# Patient Record
Sex: Female | Born: 1940 | Race: Black or African American | Hispanic: No | Smoking: Former smoker
Health system: Southern US, Community
[De-identification: ages and names within clinical notes are randomized; demographics above are authoritative.]

## PROBLEM LIST (undated history)

## (undated) DIAGNOSIS — R05 Cough: Secondary | ICD-10-CM

## (undated) DIAGNOSIS — R059 Cough, unspecified: Secondary | ICD-10-CM

## (undated) DIAGNOSIS — K219 Gastro-esophageal reflux disease without esophagitis: Secondary | ICD-10-CM

## (undated) DIAGNOSIS — I152 Hypertension secondary to endocrine disorders: Secondary | ICD-10-CM

## (undated) DIAGNOSIS — J45909 Unspecified asthma, uncomplicated: Secondary | ICD-10-CM

## (undated) DIAGNOSIS — N63 Unspecified lump in unspecified breast: Secondary | ICD-10-CM

## (undated) DIAGNOSIS — R062 Wheezing: Secondary | ICD-10-CM

## (undated) DIAGNOSIS — E119 Type 2 diabetes mellitus without complications: Secondary | ICD-10-CM

## (undated) DIAGNOSIS — J40 Bronchitis, not specified as acute or chronic: Secondary | ICD-10-CM

## (undated) DIAGNOSIS — E1159 Type 2 diabetes mellitus with other circulatory complications: Secondary | ICD-10-CM

## (undated) DIAGNOSIS — I10 Essential (primary) hypertension: Secondary | ICD-10-CM

## (undated) DIAGNOSIS — M1990 Unspecified osteoarthritis, unspecified site: Secondary | ICD-10-CM

## (undated) DIAGNOSIS — K5900 Constipation, unspecified: Secondary | ICD-10-CM

## (undated) HISTORY — DX: Unspecified osteoarthritis, unspecified site: M19.90

## (undated) HISTORY — DX: Cough, unspecified: R05.9

## (undated) HISTORY — DX: Wheezing: R06.2

## (undated) HISTORY — DX: Constipation, unspecified: K59.00

## (undated) HISTORY — DX: Cough: R05

## (undated) HISTORY — DX: Unspecified asthma, uncomplicated: J45.909

## (undated) HISTORY — DX: Gastro-esophageal reflux disease without esophagitis: K21.9

## (undated) HISTORY — DX: Unspecified lump in unspecified breast: N63.0

---

## 1978-12-22 HISTORY — PX: CYSTECTOMY: SUR359

## 1994-04-22 HISTORY — PX: ABDOMINAL HYSTERECTOMY: SHX81

## 1998-08-29 ENCOUNTER — Encounter: Payer: Self-pay | Admitting: Internal Medicine

## 1998-08-29 ENCOUNTER — Ambulatory Visit (HOSPITAL_COMMUNITY): Admission: RE | Admit: 1998-08-29 | Discharge: 1998-08-29 | Payer: Self-pay | Admitting: Internal Medicine

## 1998-09-25 ENCOUNTER — Ambulatory Visit (HOSPITAL_COMMUNITY): Admission: RE | Admit: 1998-09-25 | Discharge: 1998-09-25 | Payer: Self-pay | Admitting: Internal Medicine

## 1999-04-23 HISTORY — PX: THYROID SURGERY: SHX805

## 2000-11-07 ENCOUNTER — Other Ambulatory Visit: Admission: RE | Admit: 2000-11-07 | Discharge: 2000-11-07 | Payer: Self-pay | Admitting: Family Medicine

## 2002-06-08 ENCOUNTER — Other Ambulatory Visit: Admission: RE | Admit: 2002-06-08 | Discharge: 2002-06-08 | Payer: Self-pay | Admitting: Internal Medicine

## 2004-06-07 ENCOUNTER — Ambulatory Visit (HOSPITAL_COMMUNITY): Admission: RE | Admit: 2004-06-07 | Discharge: 2004-06-07 | Payer: Self-pay | Admitting: Family Medicine

## 2006-04-30 ENCOUNTER — Encounter: Admission: RE | Admit: 2006-04-30 | Discharge: 2006-04-30 | Payer: Self-pay | Admitting: Internal Medicine

## 2007-05-05 ENCOUNTER — Encounter: Admission: RE | Admit: 2007-05-05 | Discharge: 2007-05-05 | Payer: Self-pay | Admitting: Internal Medicine

## 2008-05-26 ENCOUNTER — Encounter: Admission: RE | Admit: 2008-05-26 | Discharge: 2008-05-26 | Payer: Self-pay | Admitting: Internal Medicine

## 2008-07-21 ENCOUNTER — Encounter: Admission: RE | Admit: 2008-07-21 | Discharge: 2008-08-18 | Payer: Self-pay | Admitting: Chiropractic Medicine

## 2008-08-13 ENCOUNTER — Inpatient Hospital Stay (HOSPITAL_COMMUNITY): Admission: EM | Admit: 2008-08-13 | Discharge: 2008-08-15 | Payer: Self-pay | Admitting: Emergency Medicine

## 2008-08-15 ENCOUNTER — Encounter (INDEPENDENT_AMBULATORY_CARE_PROVIDER_SITE_OTHER): Payer: Self-pay | Admitting: Internal Medicine

## 2008-09-28 ENCOUNTER — Encounter: Admission: RE | Admit: 2008-09-28 | Discharge: 2008-11-14 | Payer: Self-pay | Admitting: Sports Medicine

## 2009-06-04 ENCOUNTER — Ambulatory Visit (HOSPITAL_BASED_OUTPATIENT_CLINIC_OR_DEPARTMENT_OTHER): Admission: RE | Admit: 2009-06-04 | Discharge: 2009-06-04 | Payer: Self-pay | Admitting: Internal Medicine

## 2009-06-18 ENCOUNTER — Ambulatory Visit: Payer: Self-pay | Admitting: Internal Medicine

## 2009-06-27 ENCOUNTER — Encounter: Admission: RE | Admit: 2009-06-27 | Discharge: 2009-06-27 | Payer: Self-pay | Admitting: Internal Medicine

## 2009-08-14 ENCOUNTER — Inpatient Hospital Stay (HOSPITAL_COMMUNITY): Admission: RE | Admit: 2009-08-14 | Discharge: 2009-08-17 | Payer: Self-pay | Admitting: Orthopedic Surgery

## 2009-10-09 ENCOUNTER — Inpatient Hospital Stay (HOSPITAL_COMMUNITY): Admission: RE | Admit: 2009-10-09 | Discharge: 2009-10-13 | Payer: Self-pay | Admitting: Orthopedic Surgery

## 2010-05-13 ENCOUNTER — Encounter: Payer: Self-pay | Admitting: Emergency Medicine

## 2010-07-06 ENCOUNTER — Other Ambulatory Visit: Payer: Self-pay | Admitting: Internal Medicine

## 2010-07-06 DIAGNOSIS — Z1231 Encounter for screening mammogram for malignant neoplasm of breast: Secondary | ICD-10-CM

## 2010-07-08 LAB — CBC
HCT: 22.8 % — ABNORMAL LOW (ref 36.0–46.0)
HCT: 23 % — ABNORMAL LOW (ref 36.0–46.0)
HCT: 24.1 % — ABNORMAL LOW (ref 36.0–46.0)
HCT: 28.5 % — ABNORMAL LOW (ref 36.0–46.0)
HCT: 39.1 % (ref 36.0–46.0)
Hemoglobin: 13.1 g/dL (ref 12.0–15.0)
Hemoglobin: 7.7 g/dL — ABNORMAL LOW (ref 12.0–15.0)
Hemoglobin: 7.8 g/dL — ABNORMAL LOW (ref 12.0–15.0)
Hemoglobin: 9.4 g/dL — ABNORMAL LOW (ref 12.0–15.0)
MCH: 30.7 pg (ref 26.0–34.0)
MCHC: 33 g/dL (ref 30.0–36.0)
MCHC: 33.3 g/dL (ref 30.0–36.0)
MCHC: 33.5 g/dL (ref 30.0–36.0)
MCHC: 33.6 g/dL (ref 30.0–36.0)
MCV: 91.7 fL (ref 78.0–100.0)
MCV: 91.8 fL (ref 78.0–100.0)
MCV: 92.3 fL (ref 78.0–100.0)
MCV: 92.3 fL (ref 78.0–100.0)
MCV: 92.4 fL (ref 78.0–100.0)
Platelets: 104 10*3/uL — ABNORMAL LOW (ref 150–400)
Platelets: 118 10*3/uL — ABNORMAL LOW (ref 150–400)
Platelets: 132 10*3/uL — ABNORMAL LOW (ref 150–400)
Platelets: 148 10*3/uL — ABNORMAL LOW (ref 150–400)
RBC: 2.49 MIL/uL — ABNORMAL LOW (ref 3.87–5.11)
RBC: 2.51 MIL/uL — ABNORMAL LOW (ref 3.87–5.11)
RBC: 2.62 MIL/uL — ABNORMAL LOW (ref 3.87–5.11)
RBC: 3.09 MIL/uL — ABNORMAL LOW (ref 3.87–5.11)
RBC: 4.23 MIL/uL (ref 3.87–5.11)
RDW: 14.3 % (ref 11.5–15.5)
RDW: 14.4 % (ref 11.5–15.5)
RDW: 14.5 % (ref 11.5–15.5)
RDW: 14.5 % (ref 11.5–15.5)
RDW: 14.8 % (ref 11.5–15.5)
WBC: 7.6 10*3/uL (ref 4.0–10.5)
WBC: 8.5 10*3/uL (ref 4.0–10.5)
WBC: 8.7 10*3/uL (ref 4.0–10.5)
WBC: 9.3 10*3/uL (ref 4.0–10.5)

## 2010-07-08 LAB — URINALYSIS, ROUTINE W REFLEX MICROSCOPIC
Ketones, ur: NEGATIVE mg/dL
Nitrite: NEGATIVE
Protein, ur: NEGATIVE mg/dL
Specific Gravity, Urine: 1.025 (ref 1.005–1.030)
Urobilinogen, UA: 1 mg/dL (ref 0.0–1.0)
pH: 5.5 (ref 5.0–8.0)

## 2010-07-08 LAB — BASIC METABOLIC PANEL
BUN: 12 mg/dL (ref 6–23)
BUN: 20 mg/dL (ref 6–23)
CO2: 27 mEq/L (ref 19–32)
CO2: 28 mEq/L (ref 19–32)
CO2: 29 mEq/L (ref 19–32)
Calcium: 8.2 mg/dL — ABNORMAL LOW (ref 8.4–10.5)
Calcium: 9.5 mg/dL (ref 8.4–10.5)
Chloride: 103 mEq/L (ref 96–112)
Chloride: 104 mEq/L (ref 96–112)
Creatinine, Ser: 0.85 mg/dL (ref 0.4–1.2)
Creatinine, Ser: 0.87 mg/dL (ref 0.4–1.2)
Creatinine, Ser: 1.02 mg/dL (ref 0.4–1.2)
GFR calc Af Amer: >60 mL/min (ref >60)
GFR calc Af Amer: >60 mL/min (ref >60)
GFR calc Af Amer: >60 mL/min (ref >60)
GFR calc non Af Amer: 54 mL/min — ABNORMAL LOW (ref >60)
GFR calc non Af Amer: >60 mL/min (ref >60)
GFR calc non Af Amer: >60 mL/min (ref >60)
Glucose, Bld: 140 mg/dL — ABNORMAL HIGH (ref 70–99)
Glucose, Bld: 220 mg/dL — ABNORMAL HIGH (ref 70–99)
Potassium: 4.2 mEq/L (ref 3.5–5.1)
Potassium: 4.2 mEq/L (ref 3.5–5.1)
Potassium: 4.2 mEq/L (ref 3.5–5.1)
Sodium: 134 mEq/L — ABNORMAL LOW (ref 135–145)
Sodium: 140 mEq/L (ref 135–145)

## 2010-07-08 LAB — DIFFERENTIAL
Basophils Absolute: 0 10*3/uL (ref 0.0–0.1)
Basophils Relative: 0 % (ref 0–1)
Eosinophils Absolute: 0.1 10*3/uL (ref 0.0–0.7)
Eosinophils Relative: 2 % (ref 0–5)
Lymphocytes Relative: 27 % (ref 12–46)
Lymphs Abs: 2 10*3/uL (ref 0.7–4.0)
Monocytes Absolute: 0.6 10*3/uL (ref 0.1–1.0)
Monocytes Relative: 7 % (ref 3–12)
Neutro Abs: 4.8 10*3/uL (ref 1.7–7.7)
Neutrophils Relative %: 64 % (ref 43–77)

## 2010-07-08 LAB — PROTIME-INR
INR: 1.12 (ref 0.00–1.49)
INR: 1.28 (ref 0.00–1.49)
Prothrombin Time: 15.4 seconds — ABNORMAL HIGH (ref 11.6–15.2)
Prothrombin Time: 15.9 seconds — ABNORMAL HIGH (ref 11.6–15.2)

## 2010-07-08 LAB — URINE MICROSCOPIC-ADD ON

## 2010-07-08 LAB — APTT: aPTT: 24 seconds (ref 24–37)

## 2010-07-10 LAB — GLUCOSE, CAPILLARY
Glucose-Capillary: 197 mg/dL — ABNORMAL HIGH (ref 70–99)
Glucose-Capillary: 209 mg/dL — ABNORMAL HIGH (ref 70–99)
Glucose-Capillary: 319 mg/dL — ABNORMAL HIGH (ref 70–99)
Glucose-Capillary: 332 mg/dL — ABNORMAL HIGH (ref 70–99)

## 2010-07-10 LAB — CBC
HCT: 38.4 % (ref 36.0–46.0)
Hemoglobin: 13 g/dL (ref 12.0–15.0)
Hemoglobin: 9 g/dL — ABNORMAL LOW (ref 12.0–15.0)
MCHC: 33.8 g/dL (ref 30.0–36.0)
MCHC: 34.1 g/dL (ref 30.0–36.0)
MCV: 92.1 fL (ref 78.0–100.0)
MCV: 92.6 fL (ref 78.0–100.0)
MCV: 93.3 fL (ref 78.0–100.0)
Platelets: 121 10*3/uL — ABNORMAL LOW (ref 150–400)
Platelets: 128 10*3/uL — ABNORMAL LOW (ref 150–400)
Platelets: 168 10*3/uL (ref 150–400)
RBC: 2.83 MIL/uL — ABNORMAL LOW (ref 3.87–5.11)
RBC: 2.95 MIL/uL — ABNORMAL LOW (ref 3.87–5.11)
RBC: 3.33 MIL/uL — ABNORMAL LOW (ref 3.87–5.11)
RDW: 14.2 % (ref 11.5–15.5)
RDW: 14.5 % (ref 11.5–15.5)
WBC: 10.1 10*3/uL (ref 4.0–10.5)
WBC: 8.1 10*3/uL (ref 4.0–10.5)
WBC: 8.5 10*3/uL (ref 4.0–10.5)

## 2010-07-10 LAB — URINALYSIS, ROUTINE W REFLEX MICROSCOPIC
Glucose, UA: NEGATIVE mg/dL
Hgb urine dipstick: NEGATIVE
Specific Gravity, Urine: 1.022 (ref 1.005–1.030)
pH: 7 (ref 5.0–8.0)

## 2010-07-10 LAB — BASIC METABOLIC PANEL
BUN: 18 mg/dL (ref 6–23)
BUN: 21 mg/dL (ref 6–23)
Calcium: 8.1 mg/dL — ABNORMAL LOW (ref 8.4–10.5)
Creatinine, Ser: 1.12 mg/dL (ref 0.4–1.2)
GFR calc Af Amer: 58 mL/min — ABNORMAL LOW (ref >60)
Glucose, Bld: 246 mg/dL — ABNORMAL HIGH (ref 70–99)
Sodium: 139 mEq/L (ref 135–145)

## 2010-07-10 LAB — DIFFERENTIAL
Basophils Relative: 1 % (ref 0–1)
Eosinophils Absolute: 0.2 10*3/uL (ref 0.0–0.7)
Lymphocytes Relative: 26 % (ref 12–46)
Lymphs Abs: 2.2 10*3/uL (ref 0.7–4.0)
Monocytes Absolute: 0.6 10*3/uL (ref 0.1–1.0)
Neutro Abs: 5.5 10*3/uL (ref 1.7–7.7)
Neutrophils Relative %: 65 % (ref 43–77)

## 2010-07-10 LAB — TYPE AND SCREEN
ABO/RH(D): A POS
Antibody Screen: NEGATIVE

## 2010-07-10 LAB — PROTIME-INR
INR: 1.61 — ABNORMAL HIGH (ref 0.00–1.49)
INR: 1.73 — ABNORMAL HIGH (ref 0.00–1.49)
Prothrombin Time: 13.5 seconds (ref 11.6–15.2)
Prothrombin Time: 19 seconds — ABNORMAL HIGH (ref 11.6–15.2)
Prothrombin Time: 20.1 seconds — ABNORMAL HIGH (ref 11.6–15.2)

## 2010-07-10 LAB — ABO/RH: ABO/RH(D): A POS

## 2010-07-10 LAB — HEMOGLOBIN A1C: Hgb A1c MFr Bld: 7.4 % — ABNORMAL HIGH (ref <5.7)

## 2010-08-01 ENCOUNTER — Ambulatory Visit
Admission: RE | Admit: 2010-08-01 | Discharge: 2010-08-01 | Disposition: A | Discharge status: Home / Self Care | Payer: Medicare Other | Source: Ambulatory Visit | Attending: Internal Medicine | Admitting: Internal Medicine

## 2010-08-01 ENCOUNTER — Ambulatory Visit: Payer: Self-pay

## 2010-08-01 DIAGNOSIS — Z1231 Encounter for screening mammogram for malignant neoplasm of breast: Secondary | ICD-10-CM

## 2010-08-01 LAB — GLUCOSE, CAPILLARY
Glucose-Capillary: 135 mg/dL — ABNORMAL HIGH (ref 70–99)
Glucose-Capillary: 224 mg/dL — ABNORMAL HIGH (ref 70–99)

## 2010-08-01 LAB — BASIC METABOLIC PANEL
BUN: 18 mg/dL (ref 6–23)
Calcium: 9.2 mg/dL (ref 8.4–10.5)
Creatinine, Ser: 0.84 mg/dL (ref 0.4–1.2)
GFR calc Af Amer: >60 mL/min (ref >60)
GFR calc non Af Amer: >60 mL/min (ref >60)

## 2010-08-01 LAB — DIFFERENTIAL
Eosinophils Absolute: 0.1 10*3/uL (ref 0.0–0.7)
Lymphocytes Relative: 18 % (ref 12–46)
Lymphs Abs: 1.9 10*3/uL (ref 0.7–4.0)
Neutro Abs: 8.2 10*3/uL — ABNORMAL HIGH (ref 1.7–7.7)
Neutrophils Relative %: 75 % (ref 43–77)

## 2010-08-01 LAB — CK TOTAL AND CKMB (NOT AT ARMC): Relative Index: INVALID (ref 0.0–2.5)

## 2010-08-01 LAB — CARDIAC PANEL(CRET KIN+CKTOT+MB+TROPI)
CK, MB: 1.2 ng/mL (ref 0.3–4.0)
Relative Index: INVALID (ref 0.0–2.5)
Relative Index: INVALID (ref 0.0–2.5)
Total CK: 49 U/L (ref 7–177)
Total CK: 55 U/L (ref 7–177)

## 2010-08-01 LAB — CBC
MCV: 90.8 fL (ref 78.0–100.0)
Platelets: 167 10*3/uL (ref 150–400)
RBC: 4.64 MIL/uL (ref 3.87–5.11)
WBC: 10.9 10*3/uL — ABNORMAL HIGH (ref 4.0–10.5)

## 2010-08-01 LAB — LIPID PANEL
LDL Cholesterol: 94 mg/dL (ref 0–99)
VLDL: 13 mg/dL (ref 0–40)

## 2010-08-01 LAB — POCT CARDIAC MARKERS
CKMB, poc: 1.1 ng/mL (ref 1.0–8.0)
Myoglobin, poc: 41.4 ng/mL (ref 12–200)

## 2010-08-01 LAB — TSH: TSH: 1.408 u[IU]/mL (ref 0.350–4.500)

## 2010-08-01 LAB — HEMOGLOBIN A1C: Mean Plasma Glucose: 166 mg/dL

## 2010-09-04 NOTE — Discharge Summary (Signed)
NAMEMADISYN, Elizabeth [name] NO.:  [id number]   MEDICAL RECORD NO.:  [id number]          PATIENT TYPE:  INP   LOCATION:  4705                         FACILITY:  [facility]   PHYSICIAN:  Fleet Contras, M.D.    DATE OF BIRTH:  1941/04/20   DATE OF ADMISSION:  08/12/2008  DATE OF DISCHARGE:  08/15/2008                               DISCHARGE SUMMARY   HISTORY OF PRESENT ILLNESS:  Please see the dictated H and P for full  details of presentation.  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  She presented to the  emergency room at [facility] with 1-day history of central anterior  chest pain that started while she was at home and radiated to her left  neck and head.  She was treated with intravenous nitroglycerin in the  emergency room with relief of her pain and then transferred to  sublingual nitroglycerin.  She was admitted to the hospital to rule out  acute coronary syndrome.   HOSPITAL COURSE:  On admission, the patient was continued on sublingual  nitroglycerin 0.4 mg q.5 minutes x3 as needed for chest pain.  She did  not require any further episodes of nitroglycerin treatment because her  pain subsided.  Serial CPK and troponin were performed x3 and were  negative for acute coronary syndrome.  EKG did not show any acute ST-T  wave changes.  Her CBGs were checked a.c. and at bedtime and covered  with sliding scale NovoLog insulin.  Chest x-ray was negative for acute  cardiopulmonary disease.  She was on DVT prophylaxis with subcu Lovenox  and GI prophylaxis with Protonix.  Due to her multiple risk factors for  coronary artery disease, Cardiology consult was requested and the  patient was kindly seen by his [facility]  and they determined that the patient is stable for discharge home and  would undergo the patient's cardiac workup including stress test and a 2-  D echocardiogram.  Today, she is feeling much better.  She has not had  any more chest pain in the last 48 hours.  Her blood sugar this morning  was 135, blood pressure is 151/91, heart rate of 79 and regular,  respiratory rate of 20, temperature 97.8, and O2 saturations on room air  is 94%.  Her chest is clear to auscultation.  Heart sounds 1 and 2 are  heard with no murmurs.  No S3 gallops.  Abdomen is soft and nontender,  no masses.  Bowel sounds are present.  Extremities shows no edema, calf  tenderness, or swelling.  CNS:  She is alert and oriented x3 with no  focal neurological deficits.   LATEST LABORATORY DATA:  TSH is 1.4, which is within normal limits.  Hemoglobin A1c 7.4 which is slightly elevated.  She will undergo stress  test and 2-D echocardiogram in the outpatient.  She is now considered  stable for discharge home.   DISCHARGE DIAGNOSES:  1. Atypical chest pain, acute coronary syndrome ruled out, for      outpatient cardiac workup.  2. Type 2 diabetes mellitus, poorly controlled.  She is currently on      diet control and will be started on oral hypoglycemic agents in the      office.  3. Systemic hypertension, moderately controlled.  4. Morbid obesity.  5. Degenerative joint disease of the knees and shoulders.   DISCHARGE MEDICATIONS:  1. Benicar 20 mg once a day.  2. Allegra 180 mg once a day.  3. Aspirin 325 mg once a day.  4. Protonix 40 mg daily.  5. Nitroglycerin 0.4 mg 1 sublingual q.5 minutes p.r.n. for chest      pain.  6. Vicodin 5/500 one to two p.o. q.6 h p.r.n. for pain.  She will continue her other home medications at home.  She will follow  up with me in the office in 1 week.  This discharge plan was discussed  with her and her questions answered.  She will also follow up with the   cardiologist for outpatient cardiac workup.      Fleet Contras, M.D.  Electronically Signed     EA/MEDQ  D:  08/15/2008  T:  08/16/2008  Job:  [id number]

## 2010-09-04 NOTE — H&P (Signed)
NAMEELENI, Elizabeth [name] NO.:  [id number]   MEDICAL RECORD NO.:  [id number]          PATIENT TYPE:  INP   LOCATION:                               FACILITY:  [facility]   PHYSICIAN:  Fleet Contras, M.D.    DATE OF BIRTH:  October 14, 1940   DATE OF ADMISSION:  08/13/2008  DATE OF DISCHARGE:                              HISTORY & PHYSICAL   PRESENTING COMPLAINT:  Anterior chest pain.   HISTORY OF PRESENT ILLNESS:  Elizabeth [name] is a 70 year old African  American lady with past medical history significant for systemic  hypertension, type 2 diabetes mellitus, chronic bronchitis, sleep apnea  syndrome, arthritis of the shoulders and knees, allergic rhinitis.  She  came to the emergency room at [facility] with onset of anterior  chest pain that started last night while she was sitting in the house,  radiating to her left neck and left head.  She has had some runny nose  and postnasal drip and cough for about 2 weeks, which was worse at night  with postnasal drip and she felt like she wanted to burp, but she could  not.  The pain apparently got worse, and she had to come to the  emergency room for further evaluation.  She denied having any shortness  of breath, palpitations, orthopnea, PND, nausea, vomiting, or  diaphoresis.  At the emergency room, she was started on some  nitroglycerin infusion, which relieved her pain and then subsequently is  placed on a nitro patch, and she has been pain free since she arrived on  the floor.  She was admitted to the hospital to rule out acute coronary  syndrome.   PAST MEDICAL HISTORY:  1. Systemic hypertension.  2. Type 2 diabetes mellitus, diet controlled, her last hemoglobin A1c      on June 07, 2008, was 6.3.  3. Chronic bronchitis.  4. Obstructive sleep apnea syndrome.  5. Arthritis of the shoulders and knees.  6. Allergic rhinitis.   MEDICATION HISTORY:  She is on:  1. Albuterol HFA 2 puffs q.6 h. p.r.n.  2. Albuterol  nebulized 2.5 mg q.6 h. p.r.n.  3. Symbicort 160/4.5 one puff b.i.d.  4. Lisinopril and hydrochlorothiazide 20/12.5 one p.o. daily.  5. Hydrocodone/APAP 5/500 one p.o. q.6 h. p.r.n.  6. Allegra 180 mg daily.  7. Flonase 2 sprays each nostril once a day.  8. Potassium chloride 10 mEq once a day.  9. Ditropan XL 5 mg p.o. b.i.d.   ALLERGIES:  She has no known drug allergies.   SURGICAL HISTORY:  She has had a partial thyroidectomy and excision of  large lipoma of the neck.   FAMILY AND SOCIAL HISTORY:  She is married and lives with her family.  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  She denies any use of  alcohol or illicit drugs.  Both of her parents are deceased.  She has no  family history of coronary artery disease.  She has not had any previous  coronary artery workup.   REVIEW OF SYSTEMS:  GENERAL:  She has had no  fevers, chills, weakness,  or fatigue.  RESPIRATORY:  She has had some dry cough, no sputum production, no  hemoptysis.  GASTROINTESTINAL:  No nausea, vomiting, diarrhea, or constipation.  No  abdominal pain.  GENITOURINARY:  She has frequent urination with occasional incontinence.  NEUROLOGIC:  She has had no headaches, dizziness, blurring of vision, or  weakness of extremities.   PHYSICAL EXAMINATION:  GENERAL:  She is sitting up in the chair, eating  breakfast this morning.  She denies any pain at this point. She is not  pale.  She is not icteric.  She is not cyanosed.  She is well hydrated.  VITAL SIGNS:  Her latest vital signs shows a blood pressure of 133/73,  heart rate of 90, respiratory rate of 20, temperature of 97.7, O2 sats  on room air is 93%.  NECK:  Supple with no elevated JVD.  There is an anterior neck scar from  previous thyroid surgery.  CHEST:  She has good air entry bilaterally with no rales, rhonchi, or  wheezes.  HEART:  Heart sounds 1 and 2 are heard with no murmurs.  There is  tenderness to the anterior chest wall to palpation but  no real deformity  or swelling.  ABDOMEN:  Obese, soft, nontender, no masses.  Bowel sounds are present.  EXTREMITIES:  No edema, calf tenderness, or swelling.  She has  degenerative joint disease changes of the knees bilaterally.  CENTRAL NERVOUS SYSTEM:  She is alert and oriented x2 without no focal  neurological deficits.   LABORATORY DATA:  Sodium is 137, potassium 3.9, chloride 98, bicarbonate  of 31, BUN is 18, creatinine 0.84, glucose is 164.  White count is 10.9,  hemoglobin 14.3, hematocrit 42.1, platelet count of 167.  Troponin and  CPK x2 are so far negative.  Calcium is 9.2.   Chest x-ray was reported as showing no acute cardiopulmonary disease.   ASSESSMENT:  Elizabeth [name] is a 70 year old African American lady with  multiple medical problems described above, admitted via the emergency  room with anterior chest pain, which so far looks atypical with negative  enzymes and EKG.  In view of her multiple risk factors, she has been  admitted to rule out a coronary syndrome and may require cardiac workup  if her pain recurs or persists.   ADMISSION DIAGNOSES:  1. Chest pain, rule out acute coronary syndrome, likely atypical.  2. Systemic hypertension, currently well controlled.  3. Type 2 diabetes mellitus, diet controlled.  4. Sleep apnea syndrome.  5. Chronic bronchitis, currently stable.   PLAN OF CARE:  We will continue cycling enzymes with serial CPK and  troponin to complete 3.  We would discontinue Nitropaste and start  sublingual nitro 0.4 mg q.5 minutes x3 p.r.n. for chest pain, subcu  Lovenox for DVT prophylaxis, Protonix 40 mg p.o. daily for GI  prophylaxis.  We would check TSH and lipid profile in the morning.  We  would keep her in the hospital for evaluation until all these tests are  reviewed.  She will hopefully go  home in 24-48 hours once her pain subsides.  Cardiology consult will be  requested if her symptoms recur or persists.  A 2-D echocardiogram  will  be performed to evaluate for left ventricular hypertrophy and LV  systolic function.  This plan of care has been discussed with her and  her daughter and their questions answered.      Fleet Contras, M.D.  Electronically Signed  EA/MEDQ  D:  08/13/2008  T:  08/14/2008  Job:  [id number]

## 2010-09-04 NOTE — Consult Note (Signed)
Elizabeth [name], GREENSTEIN NO.:  [id number]   MEDICAL RECORD NO.:  [id number]          PATIENT TYPE:  INP   LOCATION:  4705                         FACILITY:  [facility]   PHYSICIAN:  Sheliah Mends, MD      DATE OF BIRTH:  Sep 13, 1940   DATE OF CONSULTATION:  08/15/2008  DATE OF DISCHARGE:  08/15/2008                                 CONSULTATION   REQUESTING PHYSICIAN:  Fleet Contras, MD   CONSULTING PHYSICIAN:  Sheliah Mends, MD   REASON FOR CONSULTATION:  Chest pain.   HISTORY OF PRESENT ILLNESS:  Elizabeth [name] is a 70 year old lady who  presented to the [facility] Emergency Department with chest pain with  radiation to the left neck and head.  She experienced this episode of  chest pain 40 minutes after completion of her exercise program.  At that  time, she was resting.  Her symptoms worsened at night and were  complicated by indigestion and a postnasal drip causing shortness of  breath.  Subsequently, she presented to the emergency department.   Her workup at the hospital included serial cardiac enzymes and EKGs.  Her cardiac enzymes remained negative.  Her EKG did not show any dynamic  EKG changes suggestive of ischemia.  She was started on a nitroglycerin  and has been pain free.  Elizabeth [name] risk factors include type 2  diabetes mellitus, hypertension, and morbid obesity.   PAST MEDICAL HISTORY:  1. Systemic hypertension.  2. Type 2 diabetes mellitus, diet controlled.  3. Morbid obesity.  4. History of supraventricular tachycardia.  5. Obstructive sleep apnea.  6. Osteoarthritis.  7. Allergic rhinitis.   MEDICATIONS:  1. Aspirin 325 mg p.o. daily.  2. Lovenox 40 mg p.o. daily.  3. Insulin sliding scale.  4. Claritin.  5. Benicar 20 mg p.o. daily.  6. Protonix 40 mg p.o. daily.  7. Albuterol inhaler 2 puffs q.6 h p.r.n.  8. Clonidine 0.1 mg p.o. q.6 h p.r.n.  9. Morphine.  10.Hydrocodone.   ALLERGIES:  No known drug allergies.   SURGICAL  HISTORY:  1. Partial thyroidectomy.  2. Excision of lipoma.   SOCIAL HISTORY:  The patient is married.  She quit smoking in 1997.  She  denies history of alcohol and drug abuse.   FAMILY HISTORY:  There is no family history of premature coronary artery  disease.   REVIEW OF SYSTEMS:  Positive as above, otherwise negative.   PHYSICAL EXAMINATION:  GENERAL:  The patient is alert and oriented x3.  VITAL SIGNS:  Blood pressure 140/70, heart rate 80, respiratory rate 20,  and temperature afebrile.  NECK:  Supple.  Normal JVP.  No carotid bruit.  CHEST:  Lungs are clear to auscultation bilaterally.  No rales or  wheezes.  HEART:  Regular rate and rhythm.  No rub, murmur, or gallop.  ABDOMEN:  Soft, nontender, and nondistended.  Positive bowel sounds.  EXTREMITIES:  No edema.   LABORATORY TESTS:  Cardiac enzymes negative x3.  Chest x-ray; negative  for cardiopulmonary disease.  EKG shows sinus rhythm at a rate of 80  beats  per minute, normal axis, and no significant ST-T segment changes.   IMPRESSION:  1. Chest pain of unclear etiology.  2. Dyspnea.  3. Type 2 diabetes mellitus.  4. Morbid obesity.  5. Hypertension, well controlled.   PLAN:  Elizabeth [name] initial workup did not show any significant  abnormalities.  I discussed with her the risks and benefits of a  noninvasive workup versus an invasive workup.  Given her morbid obesity,  imaging studies such as myocardial perfusion scanning or coronary CT  angiography will be technically difficult.  However, given the risk of  an invasive workup, Elizabeth [name] will be scheduled for myocardial  perfusion scan as a first step.  In addition, a transthoracic  echocardiogram will be obtained.  Should the patient's stress test be  abnormal or should she continue to experience symptoms, she will be  eventually scheduled for coronary angiography.   I discussed my findings with the patient who is comfortable with this  strategy.   Thank  you for allowing me to assist in the care of this nice lady.      Sheliah Mends, MD  Electronically Signed     JE/MEDQ  D:  08/15/2008  T:  08/15/2008  Job:  [id number]

## 2011-06-08 ENCOUNTER — Other Ambulatory Visit (HOSPITAL_COMMUNITY): Payer: Self-pay | Admitting: Pharmacy Technician

## 2011-06-08 ENCOUNTER — Emergency Department (HOSPITAL_COMMUNITY)
Admission: EM | Admit: 2011-06-08 | Discharge: 2011-06-08 | Disposition: A | Discharge status: Home / Self Care | Payer: No Typology Code available for payment source | Attending: Emergency Medicine | Admitting: Emergency Medicine

## 2011-06-08 ENCOUNTER — Emergency Department (HOSPITAL_COMMUNITY): Payer: No Typology Code available for payment source

## 2011-06-08 ENCOUNTER — Encounter (HOSPITAL_COMMUNITY): Payer: Self-pay | Admitting: *Deleted

## 2011-06-08 DIAGNOSIS — IMO0001 Reserved for inherently not codable concepts without codable children: Secondary | ICD-10-CM | POA: Insufficient documentation

## 2011-06-08 DIAGNOSIS — I10 Essential (primary) hypertension: Secondary | ICD-10-CM | POA: Insufficient documentation

## 2011-06-08 DIAGNOSIS — M25519 Pain in unspecified shoulder: Secondary | ICD-10-CM | POA: Insufficient documentation

## 2011-06-08 DIAGNOSIS — M79646 Pain in unspecified finger(s): Secondary | ICD-10-CM

## 2011-06-08 DIAGNOSIS — M79609 Pain in unspecified limb: Secondary | ICD-10-CM | POA: Insufficient documentation

## 2011-06-08 HISTORY — DX: Bronchitis, not specified as acute or chronic: J40

## 2011-06-08 HISTORY — DX: Essential (primary) hypertension: I10

## 2011-06-08 MED ORDER — HYDROCODONE-ACETAMINOPHEN 5-325 MG PO TABS: 1.0000 | ORAL_TABLET | Freq: Four times a day (QID) | ORAL | Status: AC | PRN

## 2011-06-08 MED ORDER — CYCLOBENZAPRINE HCL 5 MG PO TABS: 5.0000 mg | ORAL_TABLET | Freq: Three times a day (TID) | ORAL | Status: AC | PRN
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
## 2011-06-09 NOTE — ED Provider Notes (Signed)
Medical screening examination/treatment/procedure(s) were performed by non-physician practitioner and as supervising physician I was immediately available for consultation/collaboration.  Giavonna Pflum P Tradarius Reinwald, MD 06/09/11 0010 

## 2011-06-27 ENCOUNTER — Other Ambulatory Visit: Payer: Self-pay | Admitting: Internal Medicine

## 2011-06-27 DIAGNOSIS — Z1231 Encounter for screening mammogram for malignant neoplasm of breast: Secondary | ICD-10-CM

## 2011-08-05 ENCOUNTER — Ambulatory Visit
Admission: RE | Admit: 2011-08-05 | Discharge: 2011-08-05 | Disposition: A | Discharge status: Home / Self Care | Payer: Medicare Other | Source: Ambulatory Visit | Attending: Internal Medicine | Admitting: Internal Medicine

## 2011-08-05 DIAGNOSIS — Z1231 Encounter for screening mammogram for malignant neoplasm of breast: Secondary | ICD-10-CM

## 2011-12-20 ENCOUNTER — Other Ambulatory Visit: Payer: Self-pay | Admitting: Obstetrics & Gynecology

## 2011-12-20 DIAGNOSIS — N949 Unspecified condition associated with female genital organs and menstrual cycle: Secondary | ICD-10-CM

## 2011-12-20 DIAGNOSIS — R102 Pelvic and perineal pain: Secondary | ICD-10-CM

## 2011-12-31 ENCOUNTER — Ambulatory Visit (HOSPITAL_COMMUNITY)
Admission: RE | Admit: 2011-12-31 | Discharge: 2011-12-31 | Disposition: A | Discharge status: Home / Self Care | Payer: Medicare Other | Source: Ambulatory Visit | Attending: Obstetrics & Gynecology | Admitting: Obstetrics & Gynecology

## 2011-12-31 DIAGNOSIS — Z90710 Acquired absence of both cervix and uterus: Secondary | ICD-10-CM | POA: Insufficient documentation

## 2011-12-31 DIAGNOSIS — R102 Pelvic and perineal pain: Secondary | ICD-10-CM

## 2011-12-31 DIAGNOSIS — N949 Unspecified condition associated with female genital organs and menstrual cycle: Secondary | ICD-10-CM | POA: Insufficient documentation

## 2012-01-02 ENCOUNTER — Ambulatory Visit (INDEPENDENT_AMBULATORY_CARE_PROVIDER_SITE_OTHER): Payer: Medicare Other | Admitting: General Surgery

## 2012-01-17 ENCOUNTER — Ambulatory Visit (INDEPENDENT_AMBULATORY_CARE_PROVIDER_SITE_OTHER): Payer: Medicare Other | Admitting: General Surgery

## 2012-01-17 ENCOUNTER — Encounter (INDEPENDENT_AMBULATORY_CARE_PROVIDER_SITE_OTHER): Payer: Self-pay | Admitting: General Surgery

## 2012-01-17 VITALS — BP 152/88 | HR 76 | Temp 97.6°F | Resp 16 | Ht 64.5 in | Wt 346.1 lb

## 2012-01-17 DIAGNOSIS — N62 Hypertrophy of breast: Secondary | ICD-10-CM

## 2012-01-17 DIAGNOSIS — IMO0002 Reserved for concepts with insufficient information to code with codable children: Secondary | ICD-10-CM

## 2012-01-17 DIAGNOSIS — N644 Mastodynia: Secondary | ICD-10-CM | POA: Insufficient documentation

## 2012-01-17 NOTE — Progress Notes (Signed)
Patient ID: Elizabeth [name], female   DOB: 26-Jul-1940, 71 y.o.   MRN: [id number]  Chief Complaint  Patient presents with  . Mass    Breast    HPI Elizabeth [name] is a 71 y.o. female.   HPI 71 year old [place] American female referred by Dr. Tamela Oddi for evaluation of left breast pain. The patient states that her left breast has been causing her intermittent discomfort for about a year now. She also states that the left breast is larger than the contralateral breast. She states that her left breast is tender. There is no particular area that is more tender than another. She denies any skin changes or nipple discharge. She denies any nipple retraction. She denies any trauma to her chest. She states that this has been going on for about a year. She describes the discomfort as a dull tingling sensation. She denies any weight loss.   Menarche was at age 43. She is a G5 P4. Age of first pregnancy was around 71 years of age. She underwent a hysterectomy for bleeding polyps. She states that she never went on any hormonal replacement therapy. She states the only cancer in her family history is a maternal uncle who had prostate cancer.  Past Medical History  Diagnosis Date  . Hypertension   . Bronchitis   . Arthritis   . Asthma   . Diabetes mellitus   . GERD (gastroesophageal reflux disease)   . Breast mass in female   . Cough   . Wheezing   . Constipation   . Bronchitis     Past Surgical History  Procedure Date  . Abdominal hysterectomy 1996  . Thyroid surgery 2001  . Cystectomy 1980's    back of neck    Family History  Problem Relation Age of Onset  . Diabetes Mother   . Heart disease Mother   . Cancer Maternal Uncle     Social History History  Substance Use Topics  . Smoking status: Former Smoker    Quit date: 01/17/1964  . Smokeless tobacco: Never Used  . Alcohol Use: No    No Known Allergies  Current Outpatient Prescriptions  Medication Sig Dispense Refill  .  Cetirizine HCl (ZYRTEC PO) Take by mouth daily.      . fluticasone (FLONASE) 50 MCG/ACT nasal spray Place 2 sprays into the nose as needed.      . metFORMIN (GLUCOPHAGE) 500 MG tablet Take 500 mg by mouth 2 (two) times daily with a meal.      . albuterol (PROVENTIL HFA;VENTOLIN HFA) 108 (90 BASE) MCG/ACT inhaler Inhale 2 puffs into the lungs every 6 (six) hours as needed. For bronchitis      . albuterol (PROVENTIL) (2.5 MG/3ML) 0.083% nebulizer solution Take 2.5 mg by nebulization every 6 (six) hours as needed. For bronchitis      . budesonide-formoterol (SYMBICORT) 160-4.5 MCG/ACT inhaler Inhale 2 puffs into the lungs 2 (two) times daily.      . cholecalciferol (VITAMIN D) 1000 UNITS tablet Take 1,000 Units by mouth daily.      . hydrochlorothiazide (HYDRODIURIL) 25 MG tablet Take 25 mg by mouth daily.      Marland Kitchen lisinopril (PRINIVIL,ZESTRIL) 40 MG tablet Take 20 mg by mouth daily.        Review of Systems Review of Systems  Constitutional: Negative for fever, activity change, appetite change and unexpected weight change.  HENT: Negative for hearing loss and neck pain.   Eyes: Negative for photophobia and visual  disturbance.  Respiratory: Positive for cough and wheezing. Negative for chest tightness.        +OSA on CPAP  Cardiovascular: Negative for chest pain and leg swelling.  Gastrointestinal: Positive for constipation. Negative for blood in stool.  Genitourinary: Negative for dysuria and difficulty urinating.  Musculoskeletal:       +joint pains  Neurological: Negative for seizures, light-headedness and numbness.  Hematological: Negative for adenopathy. Does not bruise/bleed easily.  Psychiatric/Behavioral: Negative for behavioral problems.    Blood pressure 152/88, pulse 76, temperature 97.6 F (36.4 C), temperature source Temporal, resp. rate 16, height 5' 4.5" (1.638 m), weight 346 lb 2 oz (157.001 kg).  Physical Exam Physical Exam  Vitals reviewed. Constitutional: She is  oriented to person, place, and time. She appears well-developed and well-nourished. No distress.       Morbidly obese  HENT:  Head: Normocephalic and atraumatic.  Right Ear: External ear normal.  Left Ear: External ear normal.  Eyes: Conjunctivae normal are normal. No scleral icterus.  Neck: Normal range of motion. Neck supple. No tracheal deviation present.    Cardiovascular: Normal rate and regular rhythm.   Pulmonary/Chest: Effort normal and breath sounds normal. No stridor. Right breast exhibits no inverted nipple, no mass, no nipple discharge, no skin change and no tenderness. Left breast exhibits tenderness. Left breast exhibits no inverted nipple, no mass and no nipple discharge. Breasts are asymmetrical.       Left breast is larger than Rt; no skin changes or nipple retraction. Mild TTP throughout L breast. No palpable masses.   Abdominal: Soft. She exhibits no distension.  Musculoskeletal: She exhibits no edema.  Lymphadenopathy:    She has no cervical adenopathy.    She has no axillary adenopathy.       Right: No supraclavicular adenopathy present.       Left: No supraclavicular adenopathy present.  Neurological: She is alert and oriented to person, place, and time. She exhibits normal muscle tone.  Skin: Skin is warm and dry. No rash noted. She is not diaphoretic. No erythema.       Multiple skin tags rt axilla  Psychiatric: She has a normal mood and affect. Her behavior is normal. Judgment and thought content normal.    Data Reviewed Dr Tamela Oddi note from 8/29 Transvaginal u/s - negative B/l mammogram 07/2011  DIGITAL SCREENING MAMMOGRAM WITH CAD:  There are scattered fibroglandular densities. No masses or malignant type calcifications are  identified. Compared with prior studies.  Images were processed with CAD.  IMPRESSION:  No specific mammographic evidence of malignancy. Next screening mammogram is recommended in one  year.  A result letter of this screening  mammogram will be mailed directly to the patient.  ASSESSMENT: Negative - BI-RADS 1  Screening mammogram in 1 year.  ,   Assessment    Left breast pain Left breast enlargement    Plan    I do not appreciate any abnormalities on physical exam. My suspicion for malignancy is quite low. However there is some asymmetry to her breast. I have recommended that she undergo a diagnostic left breast mammogram. We did discuss breast pain and potential etiologies. We did discuss trying to cut back on caffeine, chocolate, and other items that are typically associated with causing breast pain. Followup will be based on the results of her diagnostic left breast mammogram. I did remind the patient that she needed her annual screening mammogram next spring  Lavelle Berland M. Andrey Campanile, MD, FACS General, Bariatric, & Minimally  Invasive Surgery [facility], [place]  I      [facility] M 01/17/2012, 3:49 PM

## 2012-01-17 NOTE — Patient Instructions (Signed)
We will get a left breast mammogram

## 2012-01-23 ENCOUNTER — Ambulatory Visit
Admission: RE | Admit: 2012-01-23 | Discharge: 2012-01-23 | Disposition: A | Discharge status: Home / Self Care | Payer: Medicare Other | Source: Ambulatory Visit | Attending: General Surgery | Admitting: General Surgery

## 2012-01-23 DIAGNOSIS — N644 Mastodynia: Secondary | ICD-10-CM

## 2012-01-23 DIAGNOSIS — IMO0002 Reserved for concepts with insufficient information to code with codable children: Secondary | ICD-10-CM

## 2012-07-09 ENCOUNTER — Other Ambulatory Visit: Payer: Self-pay

## 2012-07-09 DIAGNOSIS — Z1231 Encounter for screening mammogram for malignant neoplasm of breast: Secondary | ICD-10-CM

## 2012-08-12 ENCOUNTER — Ambulatory Visit
Admission: RE | Admit: 2012-08-12 | Discharge: 2012-08-12 | Disposition: A | Discharge status: Home / Self Care | Payer: Medicare Other | Source: Ambulatory Visit

## 2012-08-12 DIAGNOSIS — Z1231 Encounter for screening mammogram for malignant neoplasm of breast: Secondary | ICD-10-CM

## 2012-12-22 ENCOUNTER — Other Ambulatory Visit: Payer: Self-pay | Admitting: Gastroenterology

## 2012-12-22 DIAGNOSIS — R1310 Dysphagia, unspecified: Secondary | ICD-10-CM

## 2012-12-25 ENCOUNTER — Ambulatory Visit
Admission: RE | Admit: 2012-12-25 | Discharge: 2012-12-25 | Disposition: A | Discharge status: Home / Self Care | Payer: Medicare Other | Source: Ambulatory Visit | Attending: Gastroenterology | Admitting: Gastroenterology

## 2012-12-25 DIAGNOSIS — R1310 Dysphagia, unspecified: Secondary | ICD-10-CM

## 2013-02-08 ENCOUNTER — Encounter: Payer: Self-pay | Admitting: Podiatry

## 2013-02-08 ENCOUNTER — Ambulatory Visit: Payer: Self-pay | Admitting: Podiatry

## 2013-02-08 ENCOUNTER — Ambulatory Visit (INDEPENDENT_AMBULATORY_CARE_PROVIDER_SITE_OTHER): Payer: Medicare Other | Admitting: Podiatry

## 2013-02-08 VITALS — BP 141/73 | HR 90 | Resp 28 | Ht 63.0 in | Wt 353.0 lb

## 2013-02-08 DIAGNOSIS — B353 Tinea pedis: Secondary | ICD-10-CM

## 2013-02-08 MED ORDER — ECONAZOLE NITRATE 1 % EX CREA: TOPICAL_CREAM | Freq: Every day | CUTANEOUS | Status: DC

## 2013-02-08 NOTE — Progress Notes (Signed)
  Subjective:    Patient ID: Elizabeth [name], female    DOB: [birth year], 72 y.o.   MRN: [id number] "Look at my feet, they itch."  HPI Comments: N  Itch L  Athletes feet b/l D  5-6 mos. O  suddenly C  About the same A  no T  Vaseline, lotion       Review of Systems  Constitutional: Negative.   HENT: Positive for sneezing and trouble swallowing.   Eyes: Positive for discharge.  Respiratory: Positive for apnea and wheezing.   Cardiovascular: Negative.   Endocrine: Positive for heat intolerance.  Genitourinary: Negative.   Musculoskeletal: Positive for arthralgias and neck pain.  Skin: Negative.   Allergic/Immunologic: Positive for environmental allergies.  Neurological: Positive for numbness.  Hematological: Negative.   Psychiatric/Behavioral: Positive for agitation.    Medical history: Includes hypertension, bronchitis arthritis, anemia diabetes GERD  Surgical history: Hysterectomy thyroid surgery cystectomy.  Allergies: None reported  Current medications: See current medications in chart    Objective:   Physical Exam A 72 year old black female appears orientated x3.  Vascular: The DP and PT pulses are two over four bilaterally. Capillary fill is immediate bilaterally.  Neurological: Knee and ankle reflexes are equal and reactive bilaterally. Sensation detained in monofilament wire intact 10 over 10 locations bilaterally. Vibratory sensation intact bilaterally.  Dermatological: Dry scaling skin noted noted medially laterally and plantarly bilaterally. Hypertrophic deformed toenails x10 noted.  Musculoskeletal no restriction ankle subtalar midtarsal joints bilaterally. Bilateral HAV bunion is noted.        Assessment & Plan:  Satisfactory neurovascular status bilaterally. Tinea pedis bilaterally. Onychomycoses x10  Econazole 1% cream was Rx'd. Dispense 85 g. Sig: apply to feet daily x30 days.  Reappoint at patient's request.  Shaily Librizzi C.Leeanne Deed, DPM

## 2013-02-08 NOTE — Patient Instructions (Signed)
Apply antifungal cream to skin on the feet once a day x30 days. Washing as after application of cream.

## 2013-05-17 ENCOUNTER — Ambulatory Visit: Payer: Medicare Other | Admitting: Podiatry

## 2013-05-18 ENCOUNTER — Other Ambulatory Visit: Payer: Self-pay | Admitting: Physician Assistant

## 2013-05-18 DIAGNOSIS — M25512 Pain in left shoulder: Secondary | ICD-10-CM

## 2013-05-23 ENCOUNTER — Other Ambulatory Visit: Payer: Medicare Other

## 2013-05-23 ENCOUNTER — Ambulatory Visit
Admission: RE | Admit: 2013-05-23 | Discharge: 2013-05-23 | Disposition: A | Discharge status: Home / Self Care | Payer: Medicare Other | Source: Ambulatory Visit | Attending: Physician Assistant | Admitting: Physician Assistant

## 2013-05-23 DIAGNOSIS — M25512 Pain in left shoulder: Secondary | ICD-10-CM

## 2013-05-31 ENCOUNTER — Ambulatory Visit: Payer: Medicare Other | Admitting: Podiatry

## 2013-06-07 ENCOUNTER — Ambulatory Visit: Payer: Medicare Other | Admitting: Podiatry

## 2013-06-21 ENCOUNTER — Ambulatory Visit (INDEPENDENT_AMBULATORY_CARE_PROVIDER_SITE_OTHER): Payer: Medicare Other | Admitting: Podiatry

## 2013-06-21 ENCOUNTER — Encounter: Payer: Self-pay | Admitting: Podiatry

## 2013-06-21 VITALS — BP 159/84 | HR 94 | Resp 12

## 2013-06-21 DIAGNOSIS — B353 Tinea pedis: Secondary | ICD-10-CM

## 2013-06-21 DIAGNOSIS — M79609 Pain in unspecified limb: Secondary | ICD-10-CM

## 2013-06-21 NOTE — Patient Instructions (Signed)
Diabetes and Foot Care Diabetes may cause you to have problems because of poor blood supply (circulation) to your feet and legs. This may cause the skin on your feet to become thinner, break easier, and heal more slowly. Your skin may become dry, and the skin may peel and crack. You may also have nerve damage in your legs and feet causing decreased feeling in them. You may not notice minor injuries to your feet that could lead to infections or more serious problems. Taking care of your feet is one of the most important things you can do for yourself.  HOME CARE INSTRUCTIONS  Wear shoes at all times, even in the house. Do not go barefoot. Bare feet are easily injured.  Check your feet daily for blisters, cuts, and redness. If you cannot see the bottom of your feet, use a mirror or ask someone for help.  Wash your feet with warm water (do not use hot water) and mild soap. Then pat your feet and the areas between your toes until they are completely dry. Do not soak your feet as this can dry your skin.  Apply a moisturizing lotion or petroleum jelly (that does not contain alcohol and is unscented) to the skin on your feet and to dry, brittle toenails. Do not apply lotion between your toes.  Trim your toenails straight across. Do not dig under them or around the cuticle. File the edges of your nails with an emery board or nail file.  Do not cut corns or calluses or try to remove them with medicine.  Wear clean socks or stockings every day. Make sure they are not too tight. Do not wear knee-high stockings since they may decrease blood flow to your legs.  Wear shoes that fit properly and have enough cushioning. To break in new shoes, wear them for just a few hours a day. This prevents you from injuring your feet. Always look in your shoes before you put them on to be sure there are no objects inside.  Do not cross your legs. This may decrease the blood flow to your feet.  If you find a minor scrape,  cut, or break in the skin on your feet, keep it and the skin around it clean and dry. These areas may be cleansed with mild soap and water. Do not cleanse the area with peroxide, alcohol, or iodine.  When you remove an adhesive bandage, be sure not to damage the skin around it.  If you have a wound, look at it several times a day to make sure it is healing.  Do not use heating pads or hot water bottles. They may burn your skin. If you have lost feeling in your feet or legs, you may not know it is happening until it is too late.  Make sure your health care provider performs a complete foot exam at least annually or more often if you have foot problems. Report any cuts, sores, or bruises to your health care provider immediately. SEEK MEDICAL CARE IF:   You have an injury that is not healing.  You have cuts or breaks in the skin.  You have an ingrown nail.  You notice redness on your legs or feet.  You feel burning or tingling in your legs or feet.  You have pain or cramps in your legs and feet.  Your legs or feet are numb.  Your feet always feel cold. SEEK IMMEDIATE MEDICAL CARE IF:   There is increasing redness,   swelling, or pain in or around a wound.  There is a red line that goes up your leg.  Pus is coming from a wound.  You develop a fever or as directed by your health care provider.  You notice a bad smell coming from an ulcer or wound. Document Released: 04/05/2000 Document Revised: 12/09/2012 Document Reviewed: 09/15/2012 [employer] Patient Information 2014 [employer].  

## 2013-06-21 NOTE — Progress Notes (Deleted)
Subjective:     Patient ID: Elizabeth [name], female   DOB: 01/03/1941, 73 y.o.   MRN: [id number]  Foot Pain  Diabetic foot exam:  Left: Reflexes {Exam; reflexes:19581}  Vibratory sensation {vibratory sensation:19809}  Proprioception {Proprioception:19819}  Sharp/dull discrimination {sharp/dull:19824}  Filament test {filament test:19827} Right: Reflexes {Exam; reflexes:19581}  Vibratory sensation {vibratory sensation:19809}  Proprioception {Proprioception:19819}  Sharp/dull discrimination {sharp/dull:19824}  Filament test {filament test:19827}    Review of Systems     Objective:   Physical Exam     Assessment:     ***    Plan:     ***

## 2013-06-22 NOTE — Progress Notes (Signed)
Subjective: This patient says today complaining of painful toenails. She was last seen for similar problem on 02/08/2013  Objective: Elongated, hypertrophic, discolored toenails x10. Some residual plantar scaling noted bilaterally.  Assessment: Symptomatic onychomycoses x10 Improving but persistent tinea pedis bilaterally  Plan: Nails x10 are debrided back. She advised she could refill previous prescription for econazole and use an additional 30 days on the plantar skin bilaterally.  Reappoint x3 months

## 2013-08-10 ENCOUNTER — Other Ambulatory Visit: Payer: Self-pay

## 2013-08-10 DIAGNOSIS — Z1231 Encounter for screening mammogram for malignant neoplasm of breast: Secondary | ICD-10-CM

## 2013-09-01 ENCOUNTER — Ambulatory Visit: Payer: Medicare Other

## 2013-09-23 ENCOUNTER — Encounter (INDEPENDENT_AMBULATORY_CARE_PROVIDER_SITE_OTHER): Payer: Self-pay

## 2013-09-23 ENCOUNTER — Ambulatory Visit: Payer: Medicare Other

## 2013-09-23 ENCOUNTER — Ambulatory Visit
Admission: RE | Admit: 2013-09-23 | Discharge: 2013-09-23 | Disposition: A | Discharge status: Home / Self Care | Payer: Medicare Other | Source: Ambulatory Visit

## 2013-09-23 DIAGNOSIS — Z1231 Encounter for screening mammogram for malignant neoplasm of breast: Secondary | ICD-10-CM

## 2013-09-27 ENCOUNTER — Ambulatory Visit: Payer: Medicare Other | Admitting: Podiatry

## 2014-04-25 DIAGNOSIS — M25512 Pain in left shoulder: Secondary | ICD-10-CM | POA: Diagnosis not present

## 2014-05-01 DIAGNOSIS — G4733 Obstructive sleep apnea (adult) (pediatric): Secondary | ICD-10-CM | POA: Diagnosis not present

## 2014-05-12 DIAGNOSIS — J42 Unspecified chronic bronchitis: Secondary | ICD-10-CM | POA: Diagnosis not present

## 2014-05-12 DIAGNOSIS — I10 Essential (primary) hypertension: Secondary | ICD-10-CM | POA: Diagnosis not present

## 2014-05-12 DIAGNOSIS — K219 Gastro-esophageal reflux disease without esophagitis: Secondary | ICD-10-CM | POA: Diagnosis not present

## 2014-05-12 DIAGNOSIS — E784 Other hyperlipidemia: Secondary | ICD-10-CM | POA: Diagnosis not present

## 2014-05-12 DIAGNOSIS — E119 Type 2 diabetes mellitus without complications: Secondary | ICD-10-CM | POA: Diagnosis not present

## 2014-05-15 DIAGNOSIS — G4733 Obstructive sleep apnea (adult) (pediatric): Secondary | ICD-10-CM | POA: Diagnosis not present

## 2014-06-01 DIAGNOSIS — G4733 Obstructive sleep apnea (adult) (pediatric): Secondary | ICD-10-CM | POA: Diagnosis not present

## 2014-06-07 DIAGNOSIS — K5730 Diverticulosis of large intestine without perforation or abscess without bleeding: Secondary | ICD-10-CM | POA: Diagnosis not present

## 2014-06-07 DIAGNOSIS — Z86010 Personal history of colonic polyps: Secondary | ICD-10-CM | POA: Diagnosis not present

## 2014-06-07 DIAGNOSIS — Z09 Encounter for follow-up examination after completed treatment for conditions other than malignant neoplasm: Secondary | ICD-10-CM | POA: Diagnosis not present

## 2014-06-07 DIAGNOSIS — Z1211 Encounter for screening for malignant neoplasm of colon: Secondary | ICD-10-CM | POA: Diagnosis not present

## 2014-06-15 DIAGNOSIS — G4733 Obstructive sleep apnea (adult) (pediatric): Secondary | ICD-10-CM | POA: Diagnosis not present

## 2014-06-28 DIAGNOSIS — M7060 Trochanteric bursitis, unspecified hip: Secondary | ICD-10-CM | POA: Diagnosis not present

## 2014-06-28 DIAGNOSIS — E119 Type 2 diabetes mellitus without complications: Secondary | ICD-10-CM | POA: Diagnosis not present

## 2014-06-28 DIAGNOSIS — J42 Unspecified chronic bronchitis: Secondary | ICD-10-CM | POA: Diagnosis not present

## 2014-06-28 DIAGNOSIS — I10 Essential (primary) hypertension: Secondary | ICD-10-CM | POA: Diagnosis not present

## 2014-06-28 DIAGNOSIS — M179 Osteoarthritis of knee, unspecified: Secondary | ICD-10-CM | POA: Diagnosis not present

## 2014-06-30 DIAGNOSIS — G4733 Obstructive sleep apnea (adult) (pediatric): Secondary | ICD-10-CM | POA: Diagnosis not present

## 2014-07-05 DIAGNOSIS — M179 Osteoarthritis of knee, unspecified: Secondary | ICD-10-CM | POA: Diagnosis not present

## 2014-07-05 DIAGNOSIS — E139 Other specified diabetes mellitus without complications: Secondary | ICD-10-CM | POA: Diagnosis not present

## 2014-07-14 DIAGNOSIS — G4733 Obstructive sleep apnea (adult) (pediatric): Secondary | ICD-10-CM | POA: Diagnosis not present

## 2014-07-29 DIAGNOSIS — I10 Essential (primary) hypertension: Secondary | ICD-10-CM | POA: Diagnosis not present

## 2014-07-29 DIAGNOSIS — E784 Other hyperlipidemia: Secondary | ICD-10-CM | POA: Diagnosis not present

## 2014-07-29 DIAGNOSIS — J0120 Acute ethmoidal sinusitis, unspecified: Secondary | ICD-10-CM | POA: Diagnosis not present

## 2014-07-29 DIAGNOSIS — E119 Type 2 diabetes mellitus without complications: Secondary | ICD-10-CM | POA: Diagnosis not present

## 2014-07-29 DIAGNOSIS — J302 Other seasonal allergic rhinitis: Secondary | ICD-10-CM | POA: Diagnosis not present

## 2014-07-29 DIAGNOSIS — G441 Vascular headache, not elsewhere classified: Secondary | ICD-10-CM | POA: Diagnosis not present

## 2014-07-31 DIAGNOSIS — G4733 Obstructive sleep apnea (adult) (pediatric): Secondary | ICD-10-CM | POA: Diagnosis not present

## 2014-08-05 DIAGNOSIS — E139 Other specified diabetes mellitus without complications: Secondary | ICD-10-CM | POA: Diagnosis not present

## 2014-08-05 DIAGNOSIS — M179 Osteoarthritis of knee, unspecified: Secondary | ICD-10-CM | POA: Diagnosis not present

## 2014-08-08 ENCOUNTER — Ambulatory Visit: Payer: Self-pay | Admitting: Podiatry

## 2014-08-11 DIAGNOSIS — K219 Gastro-esophageal reflux disease without esophagitis: Secondary | ICD-10-CM | POA: Diagnosis not present

## 2014-08-11 DIAGNOSIS — J329 Chronic sinusitis, unspecified: Secondary | ICD-10-CM | POA: Diagnosis not present

## 2014-08-11 DIAGNOSIS — I10 Essential (primary) hypertension: Secondary | ICD-10-CM | POA: Diagnosis not present

## 2014-08-11 DIAGNOSIS — E119 Type 2 diabetes mellitus without complications: Secondary | ICD-10-CM | POA: Diagnosis not present

## 2014-08-14 DIAGNOSIS — G4733 Obstructive sleep apnea (adult) (pediatric): Secondary | ICD-10-CM | POA: Diagnosis not present

## 2014-08-15 DIAGNOSIS — G4733 Obstructive sleep apnea (adult) (pediatric): Secondary | ICD-10-CM | POA: Diagnosis not present

## 2014-08-15 DIAGNOSIS — J330 Polyp of nasal cavity: Secondary | ICD-10-CM | POA: Diagnosis not present

## 2014-08-15 DIAGNOSIS — J309 Allergic rhinitis, unspecified: Secondary | ICD-10-CM | POA: Diagnosis not present

## 2014-08-17 DIAGNOSIS — J449 Chronic obstructive pulmonary disease, unspecified: Secondary | ICD-10-CM | POA: Diagnosis not present

## 2014-08-26 ENCOUNTER — Other Ambulatory Visit: Payer: Self-pay

## 2014-08-26 DIAGNOSIS — Z1231 Encounter for screening mammogram for malignant neoplasm of breast: Secondary | ICD-10-CM

## 2014-08-30 DIAGNOSIS — G4733 Obstructive sleep apnea (adult) (pediatric): Secondary | ICD-10-CM | POA: Diagnosis not present

## 2014-09-04 DIAGNOSIS — E139 Other specified diabetes mellitus without complications: Secondary | ICD-10-CM | POA: Diagnosis not present

## 2014-09-04 DIAGNOSIS — M179 Osteoarthritis of knee, unspecified: Secondary | ICD-10-CM | POA: Diagnosis not present

## 2014-09-08 DIAGNOSIS — J42 Unspecified chronic bronchitis: Secondary | ICD-10-CM | POA: Diagnosis not present

## 2014-09-08 DIAGNOSIS — I10 Essential (primary) hypertension: Secondary | ICD-10-CM | POA: Diagnosis not present

## 2014-09-08 DIAGNOSIS — I872 Venous insufficiency (chronic) (peripheral): Secondary | ICD-10-CM | POA: Diagnosis not present

## 2014-09-08 DIAGNOSIS — E119 Type 2 diabetes mellitus without complications: Secondary | ICD-10-CM | POA: Diagnosis not present

## 2014-09-08 DIAGNOSIS — J302 Other seasonal allergic rhinitis: Secondary | ICD-10-CM | POA: Diagnosis not present

## 2014-09-13 DIAGNOSIS — G4733 Obstructive sleep apnea (adult) (pediatric): Secondary | ICD-10-CM | POA: Diagnosis not present

## 2014-09-28 ENCOUNTER — Ambulatory Visit
Admission: RE | Admit: 2014-09-28 | Discharge: 2014-09-28 | Disposition: A | Discharge status: Home / Self Care | Payer: Medicare Other | Source: Ambulatory Visit

## 2014-09-28 DIAGNOSIS — Z1231 Encounter for screening mammogram for malignant neoplasm of breast: Secondary | ICD-10-CM | POA: Diagnosis not present

## 2014-09-30 DIAGNOSIS — G4733 Obstructive sleep apnea (adult) (pediatric): Secondary | ICD-10-CM | POA: Diagnosis not present

## 2014-10-05 DIAGNOSIS — E139 Other specified diabetes mellitus without complications: Secondary | ICD-10-CM | POA: Diagnosis not present

## 2014-10-05 DIAGNOSIS — M179 Osteoarthritis of knee, unspecified: Secondary | ICD-10-CM | POA: Diagnosis not present

## 2014-10-14 DIAGNOSIS — G4733 Obstructive sleep apnea (adult) (pediatric): Secondary | ICD-10-CM | POA: Diagnosis not present

## 2014-10-20 DIAGNOSIS — J302 Other seasonal allergic rhinitis: Secondary | ICD-10-CM | POA: Diagnosis not present

## 2014-10-20 DIAGNOSIS — I10 Essential (primary) hypertension: Secondary | ICD-10-CM | POA: Diagnosis not present

## 2014-10-20 DIAGNOSIS — E784 Other hyperlipidemia: Secondary | ICD-10-CM | POA: Diagnosis not present

## 2014-10-20 DIAGNOSIS — E119 Type 2 diabetes mellitus without complications: Secondary | ICD-10-CM | POA: Diagnosis not present

## 2014-10-20 DIAGNOSIS — J42 Unspecified chronic bronchitis: Secondary | ICD-10-CM | POA: Diagnosis not present

## 2014-10-30 DIAGNOSIS — G4733 Obstructive sleep apnea (adult) (pediatric): Secondary | ICD-10-CM | POA: Diagnosis not present

## 2014-11-04 DIAGNOSIS — M179 Osteoarthritis of knee, unspecified: Secondary | ICD-10-CM | POA: Diagnosis not present

## 2014-11-04 DIAGNOSIS — E139 Other specified diabetes mellitus without complications: Secondary | ICD-10-CM | POA: Diagnosis not present

## 2014-11-13 DIAGNOSIS — G4733 Obstructive sleep apnea (adult) (pediatric): Secondary | ICD-10-CM | POA: Diagnosis not present

## 2014-12-01 DIAGNOSIS — G4733 Obstructive sleep apnea (adult) (pediatric): Secondary | ICD-10-CM | POA: Diagnosis not present

## 2014-12-05 DIAGNOSIS — E139 Other specified diabetes mellitus without complications: Secondary | ICD-10-CM | POA: Diagnosis not present

## 2014-12-05 DIAGNOSIS — M179 Osteoarthritis of knee, unspecified: Secondary | ICD-10-CM | POA: Diagnosis not present

## 2014-12-14 DIAGNOSIS — G4733 Obstructive sleep apnea (adult) (pediatric): Secondary | ICD-10-CM | POA: Diagnosis not present

## 2015-01-05 DIAGNOSIS — M179 Osteoarthritis of knee, unspecified: Secondary | ICD-10-CM | POA: Diagnosis not present

## 2015-01-05 DIAGNOSIS — E139 Other specified diabetes mellitus without complications: Secondary | ICD-10-CM | POA: Diagnosis not present

## 2015-01-14 DIAGNOSIS — G4733 Obstructive sleep apnea (adult) (pediatric): Secondary | ICD-10-CM | POA: Diagnosis not present

## 2015-02-04 DIAGNOSIS — M179 Osteoarthritis of knee, unspecified: Secondary | ICD-10-CM | POA: Diagnosis not present

## 2015-02-04 DIAGNOSIS — E139 Other specified diabetes mellitus without complications: Secondary | ICD-10-CM | POA: Diagnosis not present

## 2015-02-13 DIAGNOSIS — G4733 Obstructive sleep apnea (adult) (pediatric): Secondary | ICD-10-CM | POA: Diagnosis not present

## 2015-02-16 DIAGNOSIS — E784 Other hyperlipidemia: Secondary | ICD-10-CM | POA: Diagnosis not present

## 2015-02-16 DIAGNOSIS — K219 Gastro-esophageal reflux disease without esophagitis: Secondary | ICD-10-CM | POA: Diagnosis not present

## 2015-02-16 DIAGNOSIS — I10 Essential (primary) hypertension: Secondary | ICD-10-CM | POA: Diagnosis not present

## 2015-02-16 DIAGNOSIS — E1165 Type 2 diabetes mellitus with hyperglycemia: Secondary | ICD-10-CM | POA: Diagnosis not present

## 2015-02-16 DIAGNOSIS — Z23 Encounter for immunization: Secondary | ICD-10-CM | POA: Diagnosis not present

## 2015-02-16 DIAGNOSIS — I872 Venous insufficiency (chronic) (peripheral): Secondary | ICD-10-CM | POA: Diagnosis not present

## 2015-03-03 DIAGNOSIS — G4733 Obstructive sleep apnea (adult) (pediatric): Secondary | ICD-10-CM | POA: Diagnosis not present

## 2015-03-07 DIAGNOSIS — E139 Other specified diabetes mellitus without complications: Secondary | ICD-10-CM | POA: Diagnosis not present

## 2015-03-07 DIAGNOSIS — M179 Osteoarthritis of knee, unspecified: Secondary | ICD-10-CM | POA: Diagnosis not present

## 2015-03-16 DIAGNOSIS — G4733 Obstructive sleep apnea (adult) (pediatric): Secondary | ICD-10-CM | POA: Diagnosis not present

## 2015-04-06 DIAGNOSIS — E139 Other specified diabetes mellitus without complications: Secondary | ICD-10-CM | POA: Diagnosis not present

## 2015-04-06 DIAGNOSIS — M179 Osteoarthritis of knee, unspecified: Secondary | ICD-10-CM | POA: Diagnosis not present

## 2015-04-15 DIAGNOSIS — G4733 Obstructive sleep apnea (adult) (pediatric): Secondary | ICD-10-CM | POA: Diagnosis not present

## 2015-05-31 ENCOUNTER — Other Ambulatory Visit: Payer: Self-pay | Admitting: Internal Medicine

## 2015-05-31 DIAGNOSIS — N644 Mastodynia: Secondary | ICD-10-CM

## 2015-06-05 ENCOUNTER — Ambulatory Visit
Admission: RE | Admit: 2015-06-05 | Discharge: 2015-06-05 | Disposition: A | Discharge status: Home / Self Care | Payer: Medicare Other | Source: Ambulatory Visit | Attending: Internal Medicine | Admitting: Internal Medicine

## 2015-06-05 DIAGNOSIS — N644 Mastodynia: Secondary | ICD-10-CM

## 2015-07-10 ENCOUNTER — Ambulatory Visit: Payer: Medicaid Other | Admitting: Sports Medicine

## 2015-07-18 ENCOUNTER — Encounter: Payer: Self-pay | Admitting: Podiatry

## 2015-07-18 ENCOUNTER — Ambulatory Visit (INDEPENDENT_AMBULATORY_CARE_PROVIDER_SITE_OTHER): Payer: Medicare Other | Admitting: Podiatry

## 2015-07-18 DIAGNOSIS — M79675 Pain in left toe(s): Secondary | ICD-10-CM | POA: Diagnosis not present

## 2015-07-18 DIAGNOSIS — M79674 Pain in right toe(s): Secondary | ICD-10-CM | POA: Diagnosis not present

## 2015-07-18 DIAGNOSIS — B351 Tinea unguium: Secondary | ICD-10-CM | POA: Diagnosis not present

## 2015-07-18 NOTE — Patient Instructions (Signed)
Diabetes and Foot Care Diabetes may cause you to have problems because of poor blood supply (circulation) to your feet and legs. This may cause the skin on your feet to become thinner, break easier, and heal more slowly. Your skin may become dry, and the skin may peel and crack. You may also have nerve damage in your legs and feet causing decreased feeling in them. You may not notice minor injuries to your feet that could lead to infections or more serious problems. Taking care of your feet is one of the most important things you can do for yourself.  HOME CARE INSTRUCTIONS  Wear shoes at all times, even in the house. Do not go barefoot. Bare feet are easily injured.  Check your feet daily for blisters, cuts, and redness. If you cannot see the bottom of your feet, use a mirror or ask someone for help.  Wash your feet with warm water (do not use hot water) and mild soap. Then pat your feet and the areas between your toes until they are completely dry. Do not soak your feet as this can dry your skin.  Apply a moisturizing lotion or petroleum jelly (that does not contain alcohol and is unscented) to the skin on your feet and to dry, brittle toenails. Do not apply lotion between your toes.  Trim your toenails straight across. Do not dig under them or around the cuticle. File the edges of your nails with an emery board or nail file.  Do not cut corns or calluses or try to remove them with medicine.  Wear clean socks or stockings every day. Make sure they are not too tight. Do not wear knee-high stockings since they may decrease blood flow to your legs.  Wear shoes that fit properly and have enough cushioning. To break in new shoes, wear them for just a few hours a day. This prevents you from injuring your feet. Always look in your shoes before you put them on to be sure there are no objects inside.  Do not cross your legs. This may decrease the blood flow to your feet.  If you find a minor scrape,  cut, or break in the skin on your feet, keep it and the skin around it clean and dry. These areas may be cleansed with mild soap and water. Do not cleanse the area with peroxide, alcohol, or iodine.  When you remove an adhesive bandage, be sure not to damage the skin around it.  If you have a wound, look at it several times a day to make sure it is healing.  Do not use heating pads or hot water bottles. They may burn your skin. If you have lost feeling in your feet or legs, you may not know it is happening until it is too late.  Make sure your health care provider performs a complete foot exam at least annually or more often if you have foot problems. Report any cuts, sores, or bruises to your health care provider immediately. SEEK MEDICAL CARE IF:   You have an injury that is not healing.  You have cuts or breaks in the skin.  You have an ingrown nail.  You notice redness on your legs or feet.  You feel burning or tingling in your legs or feet.  You have pain or cramps in your legs and feet.  Your legs or feet are numb.  Your feet always feel cold. SEEK IMMEDIATE MEDICAL CARE IF:   There is increasing redness,   swelling, or pain in or around a wound.  There is a red line that goes up your leg.  Pus is coming from a wound.  You develop a fever or as directed by your health care provider.  You notice a bad smell coming from an ulcer or wound.   This information is not intended to replace advice given to you by your health care provider. Make sure you discuss any questions you have with your health care provider.   Document Released: 04/05/2000 Document Revised: 12/09/2012 Document Reviewed: 09/15/2012 Elsevier Interactive Patient Education 2016 Elsevier Inc.  

## 2015-07-18 NOTE — Progress Notes (Signed)
Patient ID: Elizabeth MoritaBetty H Bennison, female   DOB: 11/23/1940, 75 y.o.   MRN: [id number]   Subjective:  This patient says today complaining of painful toenails. Walking wearing shoes and request toenail debridement She was last seen for similar problem on 06/21/2013 Patient is diabetic without history of foot ulceration, claudication or amputation Patient's daughter present in treatment room today  Objective: Vascular: Bilateral nonpitting peripheral edema DP pulses 2/4 bilaterally PT pulses 1/4 bilaterally Capillary reflex immediate bilaterally  Neurological: Sensation to 10 g monofilament wire intact 5/5 bilaterally Vibratory sensation reactive bilaterally Ankle reflex equal and reactive bilaterally  Dermatological: No open skin lesions bilaterally  Elongated, hypertrophic, discolored toenails that are tender to palpation 6-10  Assessment:  Symptomatic onychomycoses x10 Type II diabetic  Plan:  Nails x10 are debrided mechanically and electronically without any bleeding.   Reappoint x3 months or at patient's request

## 2015-09-27 ENCOUNTER — Other Ambulatory Visit: Payer: Self-pay | Admitting: Internal Medicine

## 2015-09-27 DIAGNOSIS — Z1231 Encounter for screening mammogram for malignant neoplasm of breast: Secondary | ICD-10-CM

## 2015-10-12 ENCOUNTER — Ambulatory Visit
Admission: RE | Admit: 2015-10-12 | Discharge: 2015-10-12 | Disposition: A | Discharge status: Home / Self Care | Payer: Medicare Other | Source: Ambulatory Visit | Attending: Internal Medicine | Admitting: Internal Medicine

## 2015-10-12 DIAGNOSIS — Z1231 Encounter for screening mammogram for malignant neoplasm of breast: Secondary | ICD-10-CM

## 2015-10-31 ENCOUNTER — Encounter: Payer: Self-pay | Admitting: Podiatry

## 2015-10-31 ENCOUNTER — Ambulatory Visit (INDEPENDENT_AMBULATORY_CARE_PROVIDER_SITE_OTHER): Payer: Medicare Other | Admitting: Podiatry

## 2015-10-31 DIAGNOSIS — M79674 Pain in right toe(s): Secondary | ICD-10-CM

## 2015-10-31 DIAGNOSIS — B351 Tinea unguium: Secondary | ICD-10-CM

## 2015-10-31 DIAGNOSIS — M79675 Pain in left toe(s): Secondary | ICD-10-CM | POA: Diagnosis not present

## 2015-10-31 NOTE — Patient Instructions (Signed)
Diabetes and Foot Care Diabetes may cause you to have problems because of poor blood supply (circulation) to your feet and legs. This may cause the skin on your feet to become thinner, break easier, and heal more slowly. Your skin may become dry, and the skin may peel and crack. You may also have nerve damage in your legs and feet causing decreased feeling in them. You may not notice minor injuries to your feet that could lead to infections or more serious problems. Taking care of your feet is one of the most important things you can do for yourself.  HOME CARE INSTRUCTIONS  Wear shoes at all times, even in the house. Do not go barefoot. Bare feet are easily injured.  Check your feet daily for blisters, cuts, and redness. If you cannot see the bottom of your feet, use a mirror or ask someone for help.  Wash your feet with warm water (do not use hot water) and mild soap. Then pat your feet and the areas between your toes until they are completely dry. Do not soak your feet as this can dry your skin.  Apply a moisturizing lotion or petroleum jelly (that does not contain alcohol and is unscented) to the skin on your feet and to dry, brittle toenails. Do not apply lotion between your toes.  Trim your toenails straight across. Do not dig under them or around the cuticle. File the edges of your nails with an emery board or nail file.  Do not cut corns or calluses or try to remove them with medicine.  Wear clean socks or stockings every day. Make sure they are not too tight. Do not wear knee-high stockings since they may decrease blood flow to your legs.  Wear shoes that fit properly and have enough cushioning. To break in new shoes, wear them for just a few hours a day. This prevents you from injuring your feet. Always look in your shoes before you put them on to be sure there are no objects inside.  Do not cross your legs. This may decrease the blood flow to your feet.  If you find a minor scrape,  cut, or break in the skin on your feet, keep it and the skin around it clean and dry. These areas may be cleansed with mild soap and water. Do not cleanse the area with peroxide, alcohol, or iodine.  When you remove an adhesive bandage, be sure not to damage the skin around it.  If you have a wound, look at it several times a day to make sure it is healing.  Do not use heating pads or hot water bottles. They may burn your skin. If you have lost feeling in your feet or legs, you may not know it is happening until it is too late.  Make sure your health care provider performs a complete foot exam at least annually or more often if you have foot problems. Report any cuts, sores, or bruises to your health care provider immediately. SEEK MEDICAL CARE IF:   You have an injury that is not healing.  You have cuts or breaks in the skin.  You have an ingrown nail.  You notice redness on your legs or feet.  You feel burning or tingling in your legs or feet.  You have pain or cramps in your legs and feet.  Your legs or feet are numb.  Your feet always feel cold. SEEK IMMEDIATE MEDICAL CARE IF:   There is increasing redness,   swelling, or pain in or around a wound.  There is a red line that goes up your leg.  Pus is coming from a wound.  You develop a fever or as directed by your health care provider.  You notice a bad smell coming from an ulcer or wound.   This information is not intended to replace advice given to you by your health care provider. Make sure you discuss any questions you have with your health care provider.   Document Released: 04/05/2000 Document Revised: 12/09/2012 Document Reviewed: 09/15/2012 Elsevier Interactive Patient Education 2016 Elsevier Inc.  

## 2015-10-31 NOTE — Progress Notes (Signed)
Patient ID: Elizabeth MoritaBetty H Laughman, female   DOB: 12/27/1940, 75 y.o.   MRN: [id number]  Subjective: This patient says today complaining of painful toenails. Walking wearing shoes and request toenail debridement She was last seen for similar problem on 06/21/2013 Patient is diabetic without history of foot ulceration, claudication or amputation Patient's daughter present in treatment room today  Objective: Vascular: Bilateral nonpitting peripheral edema DP pulses 2/4 bilaterally PT pulses 1/4 bilaterally Capillary reflex immediate bilaterally  Neurological: Sensation to 10 g monofilament wire intact 5/5 bilaterally Vibratory sensation reactive bilaterally Ankle reflex equal and reactive bilaterally  Dermatological: No open skin lesions bilaterally Elongated, hypertrophic, discolored toenails that are tender to palpation 6-10  Assessment:  Symptomatic onychomycoses x10 Type II diabetic  Plan: Nails x10 are debrided mechanically and electronically without any bleeding.

## 2016-01-25 ENCOUNTER — Other Ambulatory Visit: Payer: Self-pay | Admitting: Internal Medicine

## 2016-01-25 DIAGNOSIS — E2839 Other primary ovarian failure: Secondary | ICD-10-CM

## 2016-01-30 ENCOUNTER — Ambulatory Visit: Payer: Medicare Other | Admitting: Podiatry

## 2016-02-07 ENCOUNTER — Ambulatory Visit: Payer: Medicare Other | Admitting: Podiatry

## 2016-02-28 ENCOUNTER — Ambulatory Visit: Payer: Medicare Other | Admitting: Podiatry

## 2016-07-25 ENCOUNTER — Encounter (INDEPENDENT_AMBULATORY_CARE_PROVIDER_SITE_OTHER): Payer: Self-pay | Admitting: Orthopedic Surgery

## 2016-07-25 ENCOUNTER — Ambulatory Visit (INDEPENDENT_AMBULATORY_CARE_PROVIDER_SITE_OTHER): Payer: Medicare Other

## 2016-07-25 ENCOUNTER — Ambulatory Visit (INDEPENDENT_AMBULATORY_CARE_PROVIDER_SITE_OTHER): Payer: Medicare Other | Admitting: Orthopedic Surgery

## 2016-07-25 DIAGNOSIS — M25511 Pain in right shoulder: Secondary | ICD-10-CM

## 2016-07-25 MED ORDER — LIDOCAINE HCL 1 % IJ SOLN
5.0000 mL | INTRAMUSCULAR | Status: AC | PRN
  Administered 2016-07-25: 5 mL

## 2016-07-25 MED ORDER — METHYLPREDNISOLONE ACETATE 40 MG/ML IJ SUSP
40.0000 mg | INTRAMUSCULAR | Status: AC | PRN
  Administered 2016-07-25: 40 mg via INTRA_ARTICULAR

## 2016-07-25 MED ORDER — BUPIVACAINE HCL 0.5 % IJ SOLN
9.0000 mL | INTRAMUSCULAR | Status: AC | PRN
  Administered 2016-07-25: 9 mL via INTRA_ARTICULAR

## 2016-07-25 NOTE — Progress Notes (Signed)
Office Visit Note   Patient: Elizabeth [name]           Date of Birth: [birth year]           MRN: [id number] Visit Date: 07/25/2016 Requested by: Fleet Contras, MD [address] [place], [place] [address] PCP: Dorrene German, MD  Subjective: Chief Complaint  Patient presents with  . Right Shoulder - Pain    HPI: Elizabeth [name] is a 76 year old female with bilateral arm pain right worse than left.  Denies any history of injury.  States that she's had pain in the shoulder which radiates down the arm for the past 2 weeks.  Pain is generally constant she is right-hand dominant.  She is not sleeping well because her arm aches.  She localizes the pain to the anterior aspect of the shoulder.  She reports decreased range of motion on the right-hand side and that she can't raise her arm very high.  Ibuprofen she takes for pain.  She denies C neck pain.  She reports some numbness and tingling in the hand but that's gone on long before this 2 week history of shoulder pain.  She does have diabetes for which she takes metformin.  She states it hurts for her to raise her arm.  She has a known history of end-stage left shoulder arthritis.  Her old MRI scan is reviewed and does show some rotator cuff pathology but significant arthritis in the left shoulder joint.              ROS: All systems reviewed are negative as they relate to the chief complaint within the history of present illness.  Patient denies  fevers or chills.   Assessment & Plan: Visit Diagnoses:  1. Right shoulder pain, unspecified chronicity     Plan: Impression is right shoulder pain 2 weeks duration with fairly normal-appearing radiographs.  She has reasonable rotator cuff strength and it doesn't appear that this is radicular type pain but that's a possibility.  Bursitis is the working diagnosis at this time with the possibility of rotator cuff pathology.  Her arm is big and thus great exam is difficult to achieve but in general her cuff  strength looks okay and her passive range of motion also looks reasonable.  Plan this injection today with 6 week return in consideration of arthrogram versus non-arthrogram MRI at that time to assess her rotator cuff  Follow-Up Instructions: Return in about 6 weeks (around 09/05/2016).   Orders:  Orders Placed This Encounter  Procedures  . XR Shoulder Right   No orders of the defined types were placed in this encounter.     Procedures: Large Joint Inj Date/Time: 07/25/2016 9:58 PM Performed by: Cammy Copa Authorized by: Cammy Copa   Consent Given by:  Patient Site marked: the procedure site was marked   Timeout: prior to procedure the correct patient, procedure, and site was verified   Indications:  Pain and diagnostic evaluation Location:  Shoulder Site:  R subacromial bursa Prep: patient was prepped and draped in usual sterile fashion   Needle Size:  18 G Needle Length:  1.5 inches Approach:  Posterior Ultrasound Guidance: No   Fluoroscopic Guidance: No   Arthrogram: No   Medications:  5 mL lidocaine 1 %; 9 mL bupivacaine 0.5 %; 40 mg methylPREDNISolone acetate 40 MG/ML Aspiration Attempted: No   Patient tolerance:  Patient tolerated the procedure well with no immediate complications     Clinical Data: No additional findings.  Objective: Vital Signs: There were no vitals taken for this visit.  Physical Exam:   Constitutional: Patient appears well-developed HEENT:  Head: Normocephalic Eyes:EOM are normal Neck: Normal range of motion Cardiovascular: Normal rate Pulmonary/chest: Effort normal Neurologic: Patient is alert Skin: Skin is warm Psychiatric: Patient has normal mood and affect    Ortho Exam: Orthopedic exam demonstrates good cervical spine range of motion with some tightness in the neck when she turns her head to the right.  She has palpable radial pulse bilaterally diminished and painful range of motion on the left consistent with  her known history of glenohumeral arthritis.  On the right-hand side external rotation 15 abduction is about this 50 forward flexion and isolated glenohumeral abduction is gone 90 on the right.  She does have not much in the way of course grinding or crepitus with internal/external rotation of the arm.  No other masses or adenopathy or skin changes noted in the right arm region.  Specialty Comments:  No specialty comments available.  Imaging: Xr Shoulder Right  Result Date: 07/25/2016 Right shoulder AP axillary outlet view obtained.  No significant glenohumeral arthritis is noted.  Type III acromion is present.  Visualized lung fields clear.  No other soft tissue calcifications noted in the shoulder girdle region    PMFS History: Patient Active Problem List   Diagnosis Date Noted  . Pain of left breast 01/17/2012   Past Medical History:  Diagnosis Date  . Arthritis   . Asthma   . Breast mass in female   . Bronchitis   . Bronchitis   . Constipation   . Cough   . Diabetes mellitus   . GERD (gastroesophageal reflux disease)   . Hypertension   . Wheezing     Family History  Problem Relation Age of Onset  . Diabetes Mother   . Heart disease Mother   . Cancer Maternal Uncle     Past Surgical History:  Procedure Laterality Date  . ABDOMINAL HYSTERECTOMY  1996  . CYSTECTOMY  1980's   back of neck  . THYROID SURGERY  2001   Social History   Occupational History  . Not on file.   Social History Main Topics  . Smoking status: Former Smoker    Quit date: 01/17/1964  . Smokeless tobacco: Never Used  . Alcohol use No  . Drug use: No  . Sexual activity: Not on file

## 2016-11-14 ENCOUNTER — Other Ambulatory Visit (HOSPITAL_COMMUNITY): Payer: Self-pay | Admitting: Internal Medicine

## 2016-11-14 DIAGNOSIS — R079 Chest pain, unspecified: Secondary | ICD-10-CM

## 2016-11-19 ENCOUNTER — Ambulatory Visit (HOSPITAL_COMMUNITY)
Admission: RE | Admit: 2016-11-19 | Discharge: 2016-11-19 | Disposition: A | Discharge status: Home / Self Care | Payer: Medicare Other | Source: Ambulatory Visit | Attending: Internal Medicine | Admitting: Internal Medicine

## 2016-11-19 ENCOUNTER — Encounter (HOSPITAL_COMMUNITY): Payer: Self-pay

## 2016-11-19 ENCOUNTER — Emergency Department (HOSPITAL_COMMUNITY)
Admission: EM | Admit: 2016-11-19 | Discharge: 2016-11-20 | Disposition: A | Discharge status: Home / Self Care | Payer: Medicare Other | Attending: Emergency Medicine | Admitting: Emergency Medicine

## 2016-11-19 ENCOUNTER — Emergency Department (HOSPITAL_COMMUNITY): Payer: Medicare Other

## 2016-11-19 DIAGNOSIS — R2241 Localized swelling, mass and lump, right lower limb: Secondary | ICD-10-CM | POA: Diagnosis present

## 2016-11-19 DIAGNOSIS — L97819 Non-pressure chronic ulcer of other part of right lower leg with unspecified severity: Secondary | ICD-10-CM | POA: Diagnosis not present

## 2016-11-19 DIAGNOSIS — R609 Edema, unspecified: Secondary | ICD-10-CM

## 2016-11-19 DIAGNOSIS — E119 Type 2 diabetes mellitus without complications: Secondary | ICD-10-CM | POA: Diagnosis not present

## 2016-11-19 DIAGNOSIS — J45909 Unspecified asthma, uncomplicated: Secondary | ICD-10-CM | POA: Insufficient documentation

## 2016-11-19 DIAGNOSIS — R0689 Other abnormalities of breathing: Secondary | ICD-10-CM | POA: Insufficient documentation

## 2016-11-19 DIAGNOSIS — R079 Chest pain, unspecified: Secondary | ICD-10-CM | POA: Insufficient documentation

## 2016-11-19 DIAGNOSIS — I87331 Chronic venous hypertension (idiopathic) with ulcer and inflammation of right lower extremity: Secondary | ICD-10-CM | POA: Diagnosis not present

## 2016-11-19 DIAGNOSIS — Z87891 Personal history of nicotine dependence: Secondary | ICD-10-CM | POA: Insufficient documentation

## 2016-11-19 DIAGNOSIS — L97919 Non-pressure chronic ulcer of unspecified part of right lower leg with unspecified severity: Secondary | ICD-10-CM

## 2016-11-19 DIAGNOSIS — I517 Cardiomegaly: Secondary | ICD-10-CM | POA: Insufficient documentation

## 2016-11-19 DIAGNOSIS — I10 Essential (primary) hypertension: Secondary | ICD-10-CM | POA: Insufficient documentation

## 2016-11-19 DIAGNOSIS — Z79899 Other long term (current) drug therapy: Secondary | ICD-10-CM | POA: Insufficient documentation

## 2016-11-19 DIAGNOSIS — I253 Aneurysm of heart: Secondary | ICD-10-CM | POA: Insufficient documentation

## 2016-11-19 LAB — CBC WITH DIFFERENTIAL/PLATELET
Basophils Absolute: 0 10*3/uL (ref 0.0–0.1)
Basophils Relative: 0 %
EOS ABS: 0.3 10*3/uL (ref 0.0–0.7)
Eosinophils Relative: 3 %
HCT: 40.5 % (ref 36.0–46.0)
Hemoglobin: 13.1 g/dL (ref 12.0–15.0)
Lymphocytes Relative: 26 %
Lymphs Abs: 2 10*3/uL (ref 0.7–4.0)
MCH: 29.6 pg (ref 26.0–34.0)
MCHC: 32.3 g/dL (ref 30.0–36.0)
MCV: 91.4 fL (ref 78.0–100.0)
MONOS PCT: 5 %
Monocytes Absolute: 0.4 10*3/uL (ref 0.1–1.0)
Neutro Abs: 4.9 10*3/uL (ref 1.7–7.7)
Neutrophils Relative %: 66 %
PLATELETS: 191 10*3/uL (ref 150–400)
RBC: 4.43 MIL/uL (ref 3.87–5.11)
RDW: 14.4 % (ref 11.5–15.5)
WBC: 7.6 10*3/uL (ref 4.0–10.5)

## 2016-11-19 LAB — URINALYSIS, ROUTINE W REFLEX MICROSCOPIC
Bacteria, UA: NONE SEEN
Bilirubin Urine: NEGATIVE
Glucose, UA: NEGATIVE mg/dL
Hgb urine dipstick: NEGATIVE
Ketones, ur: NEGATIVE mg/dL
Nitrite: NEGATIVE
PROTEIN: NEGATIVE mg/dL
Specific Gravity, Urine: 1.019 (ref 1.005–1.030)
pH: 5 (ref 5.0–8.0)

## 2016-11-19 LAB — BRAIN NATRIURETIC PEPTIDE: B Natriuretic Peptide: 80.7 pg/mL (ref 0.0–100.0)

## 2016-11-19 LAB — I-STAT CG4 LACTIC ACID, ED
LACTIC ACID, VENOUS: 2.09 mmol/L — AB (ref 0.5–1.9)
Lactic Acid, Venous: 0.92 mmol/L (ref 0.5–1.9)

## 2016-11-19 LAB — COMPREHENSIVE METABOLIC PANEL
ALT: 17 U/L (ref 14–54)
ANION GAP: 12 (ref 5–15)
AST: 17 U/L (ref 15–41)
Albumin: 3.3 g/dL — ABNORMAL LOW (ref 3.5–5.0)
Alkaline Phosphatase: 61 U/L (ref 38–126)
BILIRUBIN TOTAL: 0.4 mg/dL (ref 0.3–1.2)
BUN: 21 mg/dL — ABNORMAL HIGH (ref 6–20)
CO2: 23 mmol/L (ref 22–32)
Calcium: 9.5 mg/dL (ref 8.9–10.3)
Chloride: 102 mmol/L (ref 101–111)
Creatinine, Ser: 1.06 mg/dL — ABNORMAL HIGH (ref 0.44–1.00)
GFR calc Af Amer: 58 mL/min — ABNORMAL LOW (ref >60)
GFR calc non Af Amer: 50 mL/min — ABNORMAL LOW (ref >60)
GLUCOSE: 128 mg/dL — AB (ref 65–99)
POTASSIUM: 4 mmol/L (ref 3.5–5.1)
SODIUM: 137 mmol/L (ref 135–145)
TOTAL PROTEIN: 7.2 g/dL (ref 6.5–8.1)

## 2016-11-19 NOTE — ED Provider Notes (Signed)
[facility]-EMERGENCY DEPT Provider Note   CSN: [id number] Arrival date & time: 11/19/16  1407    History   Chief Complaint Chief Complaint  Patient presents with  . Wound Infection    HPI Elizabeth [name] is a 76 y.o. female.  76 year old female with a history of hypertension, asthma, diabetes mellitus, esophageal reflux, bronchitis, and sleep apnea presents to the emergency department for concerns of infection to her right lower extremity. Patient has had progressive swelling of her bilateral lower extremities over the past 2-3 weeks. This has been associated with redness and a burning discomfort to the right lower extremity. Daughter states that patient saw her physician 2 weeks ago who placed her on doxycycline as well as "a fluid pill". Patient completed her course of antibiotics, but symptoms have not improved. She has had some weeping from the affected extremity. Daughter expresses concern for infection. The patient has not had any associated fevers, nausea, vomiting, diarrhea, chills. Patient further denies SOB or hx of orthopnea. Patient is a diabetic. Daughter notes a weight gain of at least 5 pounds past few days.   The history is provided by the patient and a relative. No language interpreter was used.    Past Medical History:  Diagnosis Date  . Arthritis   . Asthma   . Breast mass in female   . Bronchitis   . Bronchitis   . Constipation   . Cough   . Diabetes mellitus   . GERD (gastroesophageal reflux disease)   . Hypertension   . Wheezing     Patient Active Problem List   Diagnosis Date Noted  . Pain of left breast 01/17/2012    Past Surgical History:  Procedure Laterality Date  . ABDOMINAL HYSTERECTOMY  1996  . CYSTECTOMY  1980's   back of neck  . THYROID SURGERY  2001    OB History    No data available       Home Medications    Prior to Admission medications   Medication Sig Start Date End Date Taking? Authorizing Provider  albuterol (PROVENTIL  HFA;VENTOLIN HFA) 108 (90 BASE) MCG/ACT inhaler Inhale 2 puffs into the lungs every 6 (six) hours as needed. For bronchitis    [provider]  albuterol (PROVENTIL) (2.5 MG/3ML) 0.083% nebulizer solution Take 2.5 mg by nebulization every 6 (six) hours as needed. For bronchitis    [provider]  budesonide-formoterol (SYMBICORT) 160-4.5 MCG/ACT inhaler Inhale 2 puffs into the lungs 2 (two) times daily.    [provider]  Cetirizine HCl (ZYRTEC PO) Take by mouth daily.    [provider]  cholecalciferol (VITAMIN D) 1000 UNITS tablet Take 1,000 Units by mouth daily.    [provider]  cyclobenzaprine (FLEXERIL) 10 MG tablet Take 10 mg by mouth as needed for muscle spasms.    [provider]  econazole nitrate 1 % cream Apply topically daily. Apply to skin on feet daily x 30 days 02/08/13   Carrington Clampuchman, Richard C, DPM  fluticasone (FLONASE) 50 MCG/ACT nasal spray Place 2 sprays into the nose as needed.    [provider]  gabapentin (NEURONTIN) 100 MG capsule  07/24/16   [provider]  hydrochlorothiazide (HYDRODIURIL) 25 MG tablet Take 25 mg by mouth daily.    [provider]  lisinopril (PRINIVIL,ZESTRIL) 40 MG tablet Take 20 mg by mouth daily.    [provider]  losartan (COZAAR) 25 MG tablet  07/08/16   [provider]  metFORMIN (  GLUCOPHAGE) 500 MG tablet Take 500 mg by mouth 2 (two) times daily with a meal.    [provider]  Multiple Vitamins-Minerals (CENTRUM SILVER PO) Take 1 tablet by mouth.    [provider]  oxybutynin (DITROPAN) 5 MG tablet  06/17/16   [provider]  pantoprazole (PROTONIX) 40 MG tablet Take 40 mg by mouth daily.    [provider]    Family History Family History  Problem Relation Age of Onset  . Diabetes Mother   . Heart disease Mother   . Cancer Maternal Uncle     Social History Social History  Substance Use Topics  .  Smoking status: Former Smoker    Quit date: 01/17/1964  . Smokeless tobacco: Never Used  . Alcohol use No     Allergies   Patient has no known allergies.   Review of Systems Review of Systems Ten systems reviewed and are negative for acute change, except as noted in the HPI.    Physical Exam Updated Vital Signs BP (!) 159/71 (BP Location: Right Arm)   Pulse 78   Temp 98 F (36.7 C) (Oral)   Resp 19   Ht 5\' 4"  (1.626 m)   Wt (!) 146.1 kg (322 lb)   SpO2 98%   BMI 55.27 kg/m   Physical Exam  Constitutional: She is oriented to person, place, and time. She appears well-developed and well-nourished. No distress.  Nontoxic appearing; morbidly obese  HENT:  Head: Normocephalic and atraumatic.  Eyes: Conjunctivae and EOM are normal. No scleral icterus.  Neck: Normal range of motion.  Cardiovascular: Normal rate, regular rhythm and intact distal pulses.   Pulmonary/Chest: Effort normal. No respiratory distress. She has no wheezes.  Respirations even and unlabored. Decreased breath sounds in bilateral bases. Otherwise clear.  Musculoskeletal: Normal range of motion.  Neurological: She is alert and oriented to person, place, and time. She exhibits normal muscle tone. Coordination normal.  Ambulatory with steady gait.  Skin: Skin is warm and dry. No rash noted. She is not diaphoretic. There is erythema. No pallor.  Erythema noted to the right lower extremity with associated weeping and circumferential skin breakdown. No heat to touch or red linear streaking. See imaging attached for further detail.  Psychiatric: She has a normal mood and affect. Her behavior is normal.  Nursing note and vitals reviewed.         ED Treatments / Results  Labs (all labs ordered are listed, but only abnormal results are displayed) Labs Reviewed  COMPREHENSIVE METABOLIC PANEL - Abnormal; Notable for the following:       Result Value   Glucose, Bld 128 (*)    BUN 21 (*)    Creatinine, Ser  1.06 (*)    Albumin 3.3 (*)    GFR calc non Af Amer 50 (*)    GFR calc Af Amer 58 (*)    All other components within normal limits  URINALYSIS, ROUTINE W REFLEX MICROSCOPIC - Abnormal; Notable for the following:    APPearance HAZY (*)    Leukocytes, UA SMALL (*)    Squamous Epithelial / LPF 6-30 (*)    All other components within normal limits  I-STAT CG4 LACTIC ACID, ED - Abnormal; Notable for the following:    Lactic Acid, Venous 2.09 (*)    All other components within normal limits  CBC WITH DIFFERENTIAL/PLATELET  BRAIN NATRIURETIC PEPTIDE  I-STAT CG4 LACTIC ACID, ED    EKG  EKG Interpretation None  Radiology Dg Chest 2 View  Result Date: 11/19/2016 CLINICAL DATA:  Shortness of breath. Lower extremity infections. History of asthma, bronchitis, diabetes, and hypertension. EXAM: CHEST  2 VIEW COMPARISON:  08/09/2009 FINDINGS: Shallow inspiration. Cardiac enlargement. Pulmonary vascularity is normal. No airspace disease or consolidation in the lungs. No blunting of costophrenic angles. No pneumothorax. Calcified and tortuous aorta. Surgical clips in the base of the neck. Degenerative changes in the spine and shoulders. IMPRESSION: Shallow inspiration. Cardiac enlargement. No evidence of active pulmonary disease. Electronically Signed   By: Burman NievesWilliam  Stevens M.D.   On: 11/19/2016 22:16    Procedures Procedures (including critical care time)  Medications Ordered in ED Medications - No data to display   Initial Impression / Assessment and Plan / ED Course  I have reviewed the triage vital signs and the nursing notes.  Pertinent labs & imaging results that were available during my care of the patient were reviewed by me and considered in my medical decision making (see chart for details).     [age] year female presents for worsening lower extremity swelling with red discoloration to her right lower leg weeping serous fluid. Daughter expresses concern for infection; however,  patient is afebrile. She has no leukocytosis. There is no associated heat to touch or red linear streaking. Discoloration and drainage suspect is secondary to severe peripheral edema associated stasis dermatitis. There is skin maceration and breakdown. Areas of the right lower extremity consistent with grade 1 stasis ulcer.  Care management has seen and evaluated the patient. They will referred to wound care for follow-up. Patient placed in bilateral Unna boots. I do not believe antibiotics are indicated in the management of this patient's symptoms. Primary care follow-up advised in return precautions given. Patient discharged in stable condition with no unaddressed concerns.   Final Clinical Impressions(s) / ED Diagnoses   Final diagnoses:  Peripheral edema  Stasis edema with ulcer and inflammation, right [facility](HCC)    New Prescriptions New Prescriptions   No medications on file     Antony MaduraHumes, Inara Dike, Cordelia Poche-C 11/20/16 0003    Mancel BaleWentz, Elliott, MD 11/21/16 [id number]    Mancel BaleWentz, Elliott, MD 11/21/16 [phone]

## 2016-11-19 NOTE — Discharge Instructions (Signed)
Wear your Una boots at all times. We advise that you keep your legs elevated over the level of your heart as much as possible. Take tylenol or ibuprofen for pain. Follow up with the wound care center as well as your primary care doctor for additional care and management. Continue your daily medications including the new fluid pill prescribed by your doctor.

## 2016-11-19 NOTE — ED Notes (Signed)
Pt st's she has had bil lower leg swelling for a while.  Now Left lower leg is weeping,  Daughter concerned that leg will get infected because pt is diabetic.

## 2016-11-19 NOTE — ED Triage Notes (Signed)
Pt. Has a rt. Leg infection and has been on antibiots for over 2 weeks.  Pt. Reports the leg is weeping and burning.  Unable to assess the leg in triage due to bandage covers the complete lower leg to the knee.  Pt. Is alert and oriented  X4.    Pt. Denies any n/v/d , Pt. Denies any chilss or fevers Pt. Is a diabetic.

## 2016-11-19 NOTE — ED Notes (Signed)
Ortho tech at bedside applying bil UNA boots

## 2016-11-19 NOTE — Progress Notes (Signed)
  Echocardiogram 2D Echocardiogram has been performed.  Elizabeth [name], Elizabeth [name] 11/19/2016, 3:04 PM

## 2016-11-19 NOTE — ED Provider Notes (Signed)
  Face-to-face evaluation   History: She presents for evaluation swelling legs, 3 weeks, without trauma.  She is on doxycycline without improvement.  No fever, vomiting or weakness.  Physical exam: Obese alert cooperative.  Bilateral lower extremity swelling.  Mild skin breakdown left lower leg, moderate skin breakdown right lower leg, none distinct pattern.  Areas of weepiness but no purulence, or bleeding.  Medical screening examination/treatment/procedure(s) were conducted as a shared visit with non-physician practitioner(s) and myself.  I personally evaluated the patient during the encounter    Mancel BaleWentz, Zara Wendt, MD 11/21/16 [phone]

## 2016-11-19 NOTE — Care Management (Signed)
ED CM met with patient and daughter to discuss recommendations for [facility] services and follow up wound care. Patient presented to [facility] ED with c/o rt. leg pain was started on abx for leg infection 2 weeks ago. CM met with patient and daughter to discuss [facility] services patient and daughter are agreeable. Offered choice [facility] selected obtained orders Referral faxed into [facility], received fax confirmation. CM will follow up with [facility] in the am. Recommendation from EDP to also follow up with [facility] Wound care clinic, information was given to follow up and CM will contact the clinic regarding this patient. Patient and daughter verbalized understanding and denies any questions or concerns regarding transitional care at this time. Updated EDP on discharge plan Patient will be discharge home with family and transported home via private vehicle.

## 2016-11-19 NOTE — Progress Notes (Signed)
Orthopedic Tech Progress Note Patient Details:  Elizabeth [name] 12/14/1940 [id number]  Ortho Devices Type of Ortho Device: Radio broadcast assistantUnna boot Ortho Device/Splint Location: (B) LE Ortho Device/Splint Interventions: Ordered, Application   Jennye MoccasinHughes, [name] 11/19/2016, 11:24 PM

## 2016-11-19 NOTE — ED Notes (Signed)
Ortho tech paged  

## 2016-11-19 NOTE — ED Notes (Signed)
Wanda case manager in to see pt at this time.

## 2016-11-26 ENCOUNTER — Telehealth: Payer: Self-pay | Admitting: Surgery

## 2016-11-26 ENCOUNTER — Encounter (HOSPITAL_BASED_OUTPATIENT_CLINIC_OR_DEPARTMENT_OTHER): Payer: Medicare Other | Attending: Surgery

## 2016-11-26 DIAGNOSIS — E11622 Type 2 diabetes mellitus with other skin ulcer: Secondary | ICD-10-CM | POA: Diagnosis not present

## 2016-11-26 DIAGNOSIS — G4730 Sleep apnea, unspecified: Secondary | ICD-10-CM | POA: Insufficient documentation

## 2016-11-26 DIAGNOSIS — I10 Essential (primary) hypertension: Secondary | ICD-10-CM | POA: Insufficient documentation

## 2016-11-26 DIAGNOSIS — Z6841 Body Mass Index (BMI) 40.0 and over, adult: Secondary | ICD-10-CM | POA: Diagnosis not present

## 2016-11-26 DIAGNOSIS — J45909 Unspecified asthma, uncomplicated: Secondary | ICD-10-CM | POA: Diagnosis not present

## 2016-11-26 DIAGNOSIS — Z79899 Other long term (current) drug therapy: Secondary | ICD-10-CM | POA: Diagnosis not present

## 2016-11-26 DIAGNOSIS — Z7951 Long term (current) use of inhaled steroids: Secondary | ICD-10-CM | POA: Insufficient documentation

## 2016-11-26 DIAGNOSIS — L97212 Non-pressure chronic ulcer of right calf with fat layer exposed: Secondary | ICD-10-CM | POA: Diagnosis not present

## 2016-11-26 DIAGNOSIS — L97222 Non-pressure chronic ulcer of left calf with fat layer exposed: Secondary | ICD-10-CM | POA: Insufficient documentation

## 2016-11-26 DIAGNOSIS — E1140 Type 2 diabetes mellitus with diabetic neuropathy, unspecified: Secondary | ICD-10-CM | POA: Insufficient documentation

## 2016-11-26 DIAGNOSIS — I890 Lymphedema, not elsewhere classified: Secondary | ICD-10-CM | POA: Insufficient documentation

## 2016-11-26 DIAGNOSIS — Z7984 Long term (current) use of oral hypoglycemic drugs: Secondary | ICD-10-CM | POA: Insufficient documentation

## 2016-11-27 NOTE — Telephone Encounter (Signed)
ED CM received call from patient's daughter [name]. CM reviewed record [facility]  [facility].

## 2016-11-29 ENCOUNTER — Encounter: Payer: Self-pay | Admitting: Family

## 2016-12-02 ENCOUNTER — Ambulatory Visit (HOSPITAL_COMMUNITY): Admission: RE | Admit: 2016-12-02 | Payer: Medicare Other | Source: Ambulatory Visit

## 2016-12-02 ENCOUNTER — Other Ambulatory Visit: Payer: Self-pay | Admitting: Surgery

## 2016-12-02 ENCOUNTER — Ambulatory Visit (HOSPITAL_COMMUNITY)
Admission: RE | Admit: 2016-12-02 | Discharge: 2016-12-02 | Disposition: A | Discharge status: Home / Self Care | Payer: Medicare Other | Source: Ambulatory Visit | Attending: Surgery | Admitting: Surgery

## 2016-12-02 DIAGNOSIS — L97909 Non-pressure chronic ulcer of unspecified part of unspecified lower leg with unspecified severity: Secondary | ICD-10-CM | POA: Insufficient documentation

## 2016-12-02 LAB — VAS US LOWER EXTREMITY ARTERIAL DUPLEX
LSFMPSV: -211 cm/s
Left ant tibial distal sys: 152 cm/s
Left super femoral dist sys PSV: -88 cm/s
Left super femoral prox sys PSV: 157 cm/s
RIGHT ANT DIST TIBAL SYS PSV: 122 cm/s
RSFMPSV: -172 cm/s
RSFPPSV: 166 cm/s
RTIBDISTSYS: 55 cm/s
Right super femoral dist sys PSV: -119 cm/s
left post tibial dist sys: 38 cm/s

## 2016-12-03 ENCOUNTER — Ambulatory Visit (HOSPITAL_COMMUNITY)
Admission: RE | Admit: 2016-12-03 | Discharge: 2016-12-03 | Disposition: A | Discharge status: Home / Self Care | Payer: Medicare Other | Source: Ambulatory Visit | Attending: Vascular Surgery | Admitting: Vascular Surgery

## 2016-12-03 DIAGNOSIS — L97909 Non-pressure chronic ulcer of unspecified part of unspecified lower leg with unspecified severity: Secondary | ICD-10-CM | POA: Diagnosis not present

## 2016-12-03 DIAGNOSIS — L97929 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 with unspecified severity: Secondary | ICD-10-CM | POA: Diagnosis present

## 2016-12-03 DIAGNOSIS — L97919 Non-pressure chronic ulcer of unspecified part of right lower leg with unspecified severity: Secondary | ICD-10-CM | POA: Diagnosis not present

## 2016-12-04 ENCOUNTER — Encounter (HOSPITAL_BASED_OUTPATIENT_CLINIC_OR_DEPARTMENT_OTHER): Payer: Medicare Other

## 2016-12-04 DIAGNOSIS — E11622 Type 2 diabetes mellitus with other skin ulcer: Secondary | ICD-10-CM | POA: Diagnosis not present

## 2016-12-10 DIAGNOSIS — E11622 Type 2 diabetes mellitus with other skin ulcer: Secondary | ICD-10-CM | POA: Diagnosis not present

## 2016-12-16 ENCOUNTER — Encounter: Payer: Self-pay | Admitting: Podiatry

## 2016-12-16 ENCOUNTER — Ambulatory Visit (INDEPENDENT_AMBULATORY_CARE_PROVIDER_SITE_OTHER): Payer: Medicare Other | Admitting: Podiatry

## 2016-12-16 DIAGNOSIS — M79675 Pain in left toe(s): Secondary | ICD-10-CM | POA: Diagnosis not present

## 2016-12-16 DIAGNOSIS — M79674 Pain in right toe(s): Secondary | ICD-10-CM | POA: Diagnosis not present

## 2016-12-16 DIAGNOSIS — B351 Tinea unguium: Secondary | ICD-10-CM | POA: Diagnosis not present

## 2016-12-16 NOTE — Patient Instructions (Signed)

## 2016-12-16 NOTE — Progress Notes (Signed)
Patient ID: Elizabeth [name], female   DOB: [birth year], 76 y.o.   MRN: [id number]    Subjective: This patient says today complaining of painful toenails. Walking wearing shoes and request toenail debridement She was last seen for similar problem on 06/21/2013 Patient is diabetic without history of foot ulceration, claudication or amputation Patient's daughter present in treatment room today  Objective: Vascular: Bilateral nonpitting peripheral edema DP pulses 2/4 bilaterally PT pulses 1/4 bilaterally Capillary reflex immediate bilaterally  Neurological: Sensation to 10 g monofilament wire intact 5/5 bilaterally Vibratory sensation reactive bilaterally Ankle reflex equal and reactive bilaterally  Dermatological: No open skin lesions bilaterally Elongated, hypertrophic, discolored toenails that are tender to palpation 6-10  Assessment:  Symptomatic onychomycoses x10 Type II diabetic  Plan: Nails x10 are debrided mechanically and electronically without any bleeding  Reappoint 3 months

## 2016-12-17 DIAGNOSIS — E11622 Type 2 diabetes mellitus with other skin ulcer: Secondary | ICD-10-CM | POA: Diagnosis not present

## 2016-12-24 ENCOUNTER — Encounter (HOSPITAL_BASED_OUTPATIENT_CLINIC_OR_DEPARTMENT_OTHER): Payer: Medicare Other | Attending: Surgery

## 2016-12-24 DIAGNOSIS — L97829 Non-pressure chronic ulcer of other part of left lower leg with unspecified severity: Secondary | ICD-10-CM | POA: Diagnosis not present

## 2016-12-24 DIAGNOSIS — G4730 Sleep apnea, unspecified: Secondary | ICD-10-CM | POA: Insufficient documentation

## 2016-12-24 DIAGNOSIS — L97812 Non-pressure chronic ulcer of other part of right lower leg with fat layer exposed: Secondary | ICD-10-CM | POA: Diagnosis not present

## 2016-12-24 DIAGNOSIS — I890 Lymphedema, not elsewhere classified: Secondary | ICD-10-CM | POA: Diagnosis not present

## 2016-12-24 DIAGNOSIS — E11622 Type 2 diabetes mellitus with other skin ulcer: Secondary | ICD-10-CM | POA: Insufficient documentation

## 2016-12-24 DIAGNOSIS — E1140 Type 2 diabetes mellitus with diabetic neuropathy, unspecified: Secondary | ICD-10-CM | POA: Diagnosis not present

## 2016-12-24 DIAGNOSIS — I10 Essential (primary) hypertension: Secondary | ICD-10-CM | POA: Insufficient documentation

## 2016-12-31 DIAGNOSIS — E11622 Type 2 diabetes mellitus with other skin ulcer: Secondary | ICD-10-CM | POA: Diagnosis not present

## 2017-01-10 ENCOUNTER — Encounter: Payer: Medicare Other | Admitting: Vascular Surgery

## 2017-01-15 DIAGNOSIS — E11622 Type 2 diabetes mellitus with other skin ulcer: Secondary | ICD-10-CM | POA: Diagnosis not present

## 2017-01-17 ENCOUNTER — Ambulatory Visit (INDEPENDENT_AMBULATORY_CARE_PROVIDER_SITE_OTHER): Payer: Medicare Other | Admitting: Vascular Surgery

## 2017-01-17 ENCOUNTER — Encounter: Payer: Self-pay | Admitting: Vascular Surgery

## 2017-01-17 VITALS — BP 178/86 | HR 80 | Ht 64.0 in | Wt 337.2 lb

## 2017-01-17 DIAGNOSIS — I872 Venous insufficiency (chronic) (peripheral): Secondary | ICD-10-CM | POA: Insufficient documentation

## 2017-01-17 DIAGNOSIS — L97919 Non-pressure chronic ulcer of unspecified part of right lower leg with unspecified severity: Secondary | ICD-10-CM

## 2017-01-17 DIAGNOSIS — I83019 Varicose veins of right lower extremity with ulcer of unspecified site: Secondary | ICD-10-CM | POA: Diagnosis not present

## 2017-01-17 DIAGNOSIS — I83029 Varicose veins of left lower extremity with ulcer of unspecified site: Secondary | ICD-10-CM

## 2017-01-17 DIAGNOSIS — L97929 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 with unspecified severity: Secondary | ICD-10-CM | POA: Diagnosis not present

## 2017-01-17 NOTE — Progress Notes (Signed)
Requested by:  Fleet Contras, MD [address] [place], [place] [address]  Reason for consultation: bilateral leg ulcer (R>L)    History of Present Illness   Elizabeth [name] is a 76 y.o. (05-Jul-1940) female who presents with chief complaint: bilateral leg ulcers.  Patient notes, onset of swelling 4-6 months ago, associated with no obvious triggers.  The patient's symptoms include: weeping wounds on both legs.  The patient has had no history of DVT, known history of pregnancy, known history of varicose vein, known history of venous stasis ulcers, no history of  Lymphedema and known history of skin changes in lower legs.  There is limited family history of venous disorders.  The patient has used compression stockings in the past.  Past Medical History:  Diagnosis Date  . Arthritis   . Asthma   . Breast mass in female   . Bronchitis   . Bronchitis   . Constipation   . Cough   . Diabetes mellitus   . GERD (gastroesophageal reflux disease)   . Hypertension   . Wheezing     Past Surgical History:  Procedure Laterality Date  . ABDOMINAL HYSTERECTOMY  1996  . CYSTECTOMY  1980's   back of neck  . THYROID SURGERY  2001    Social History   Social History  . Marital status: Married    Spouse name: N/A  . Number of children: N/A  . Years of education: N/A   Occupational History  . Not on file.   Social History Main Topics  . Smoking status: Former Smoker    Quit date: 01/17/1964  . Smokeless tobacco: Never Used  . Alcohol use No  . Drug use: No  . Sexual activity: Not on file   Other Topics Concern  . Not on file   Social History Narrative  . No narrative on file    Family History  Problem Relation Age of Onset  . Diabetes Mother   . Heart disease Mother   . Cancer Maternal Uncle     Current Outpatient Prescriptions  Medication Sig Dispense Refill  . albuterol (PROVENTIL HFA;VENTOLIN HFA) 108 (90 BASE) MCG/ACT inhaler Inhale 2 puffs into the lungs every 6  (six) hours as needed. For bronchitis    . albuterol (PROVENTIL) (2.5 MG/3ML) 0.083% nebulizer solution Take 2.5 mg by nebulization every 6 (six) hours as needed. For bronchitis    . budesonide-formoterol (SYMBICORT) 160-4.5 MCG/ACT inhaler Inhale 2 puffs into the lungs 2 (two) times daily.    . Cetirizine HCl (ZYRTEC PO) Take by mouth daily.    . cholecalciferol (VITAMIN D) 1000 UNITS tablet Take 1,000 Units by mouth daily.    . cyclobenzaprine (FLEXERIL) 10 MG tablet Take 10 mg by mouth as needed for muscle spasms.    Marland Kitchen econazole nitrate 1 % cream Apply topically daily. Apply to skin on feet daily x 30 days 85 g 0  . fluticasone (FLONASE) 50 MCG/ACT nasal spray Place 2 sprays into the nose as needed.    . gabapentin (NEURONTIN) 100 MG capsule     . hydrochlorothiazide (HYDRODIURIL) 25 MG tablet Take 25 mg by mouth daily.    Marland Kitchen lisinopril (PRINIVIL,ZESTRIL) 40 MG tablet Take 20 mg by mouth daily.    Marland Kitchen losartan (COZAAR) 25 MG tablet     . metFORMIN (GLUCOPHAGE) 500 MG tablet Take 500 mg by mouth 2 (two) times daily with a meal.    . Multiple Vitamins-Minerals (CENTRUM SILVER PO) Take 1 tablet  by mouth.    . oxybutynin (DITROPAN) 5 MG tablet     . pantoprazole (PROTONIX) 40 MG tablet Take 40 mg by mouth daily.     No current facility-administered medications for this visit.     No Known Allergies  REVIEW OF SYSTEMS (negative unless checked):   Cardiac:   Chest pain or chest pressure?  Shortness of breath upon activity?  Shortness of breath when lying flat?  Irregular heart rhythm?  Vascular:   Pain in calf, thigh, or hip brought on by walking?  Pain in feet at night that wakes you up from your sleep?  Blood clot in your veins?  Leg swelling?  Pulmonary:   Oxygen at home?  Productive cough?  Wheezing?  Neurologic:   Sudden weakness in arms or legs?  Sudden numbness in arms or legs?  Sudden onset of difficult speaking or slurred speech?   Temporary loss of vision in one eye?  Problems with dizziness?  Gastrointestinal:   Blood in stool?  Vomited blood?  Genitourinary:   Burning when urinating?  Blood in urine?  Psychiatric:   Major depression  Hematologic:   Bleeding problems?  Problems with blood clotting?  Dermatologic:   Rashes or ulcers?  Constitutional:   Fever or chills?  Ear/Nose/Throat:   Change in hearing?  Nose bleeds?  Sore throat?  Musculoskeletal:   Back pain?  Joint pain?  Muscle pain?   Physical Examination     Vitals:   01/17/17 1242  BP: (!) 178/86  Pulse: 80  SpO2: 96%  Weight: (!) 337 lb 3.2 oz (153 kg)  Height:  (1.626 m)   Body mass index is 57.88 kg/m.  General Alert, O x 3, Obese , NAD  [place]/AT,    Ear/Nose/ Throat Hearing grossly intact, nares without erythema or drainage, oropharynx without Erythema or Exudate, Mallampati score: 3,   Eyes PERRLA, EOMI,    Neck Supple, mid-line trachea,    Pulmonary Sym exp, good B air movt, CTA B  Cardiac RRR, Nl S1, S2, no Murmurs, No rubs, No S3,S4  Vascular Vessel Right Left  Radial Palpable Palpable  Brachial Palpable Palpable  Carotid Palpable, No Bruit Palpable, No Bruit  Aorta Not palpable N/A  Femoral Not palpable due to pannus Not palpable due to pannus  Popliteal Not palpable Not palpable  PT Faintly palpable Faintly palpable  DP Palpable Palpable    Gastro- intestinal soft, non-distended, non-tender to palpation, No guarding or rebound, no HSM, no masses, no CVAT B, No palpable prominent aortic pulse,    Musculo- skeletal M/S 5/5 throughout  , Extremities without ischemic changes  , Pitting edema present: 2-3+ B, No visible varicosities , Lipodermatosclerosis present: R calf, healed knee incision, superficial R lateral calf VSU, L calf near healed VSU  Neurologic Cranial nerves 2-12 intact , Pain and light touch intact in extremities , Motor exam as listed above    Psychiatric Judgement intact, Mood & affect appropriate for pt's clinical situation  Dermatologic See M/S exam for extremity exam, No rashes otherwise noted  Lymphatic  Palpable lymph nodes: None    Non-invasive Vascular Imaging   BLE Venous Insufficiency Duplex (12/03/16):   RLE:   no DVT and SVT,   no GSV reflux,   + SSV reflux: 3.1-4.3 mm  + deep venous reflux: CFV  LLE:  no DVT and SVT,   no GSV reflux,   no SSV reflux,  + deep venous reflux: SFJ, CFV  BLE Arterial  Duplex (12/03/16)  R: biphasic except PT, pop ?108/310 c/s  L: biphasic except PT   Outside Studies/Documentation   5 pages of outside documents were reviewed including: outpatient wound clinic.   Medical Decision Making   Elizabeth [name] is a 76 y.o. female who presents with: BLE chronic venous insufficiency (C5), B VSUm phlebolymphedema   I suspect some technical limitation with prior RLE arterial duplex.    As pt has palpable pulses, would just repeat the study in 6 months along with BLE ABI once this patient has finished healing her B VSU.  Based on the patient's history and examination, I recommend: continuing compression.  This patient's CVI is predominantly in the deep system though she does have some R SSV reflux.  Additionally, I suspect development of some mild lymphedema in R>>L so increasing compression to 30-40 mm Hg along with SCD and lymphatic massage may be necessary at some point.  Thank you for allowing [place] to participate in this patient's care.   Leonides Sake, MD, FACS [facility] Office: [phone] Pager: [phone]  01/17/2017, 12:42 PM

## 2017-01-21 NOTE — Addendum Note (Signed)
Addended by: Burton Apley A on: 01/21/2017 03:15 PM   Modules accepted: Orders

## 2017-01-28 ENCOUNTER — Other Ambulatory Visit: Payer: Self-pay | Admitting: Internal Medicine

## 2017-01-28 DIAGNOSIS — Z1231 Encounter for screening mammogram for malignant neoplasm of breast: Secondary | ICD-10-CM

## 2017-01-29 ENCOUNTER — Encounter (HOSPITAL_BASED_OUTPATIENT_CLINIC_OR_DEPARTMENT_OTHER): Payer: Medicare Other | Attending: Surgery

## 2017-01-29 DIAGNOSIS — I87323 Chronic venous hypertension (idiopathic) with inflammation of bilateral lower extremity: Secondary | ICD-10-CM | POA: Insufficient documentation

## 2017-01-29 DIAGNOSIS — Z6841 Body Mass Index (BMI) 40.0 and over, adult: Secondary | ICD-10-CM | POA: Diagnosis not present

## 2017-01-29 DIAGNOSIS — E1140 Type 2 diabetes mellitus with diabetic neuropathy, unspecified: Secondary | ICD-10-CM | POA: Insufficient documentation

## 2017-01-29 DIAGNOSIS — L97822 Non-pressure chronic ulcer of other part of left lower leg with fat layer exposed: Secondary | ICD-10-CM | POA: Insufficient documentation

## 2017-01-29 DIAGNOSIS — G4730 Sleep apnea, unspecified: Secondary | ICD-10-CM | POA: Diagnosis not present

## 2017-01-29 DIAGNOSIS — L97819 Non-pressure chronic ulcer of other part of right lower leg with unspecified severity: Secondary | ICD-10-CM | POA: Diagnosis not present

## 2017-01-29 DIAGNOSIS — I890 Lymphedema, not elsewhere classified: Secondary | ICD-10-CM | POA: Insufficient documentation

## 2017-01-29 DIAGNOSIS — I10 Essential (primary) hypertension: Secondary | ICD-10-CM | POA: Insufficient documentation

## 2017-02-05 DIAGNOSIS — I87323 Chronic venous hypertension (idiopathic) with inflammation of bilateral lower extremity: Secondary | ICD-10-CM | POA: Diagnosis not present

## 2017-02-19 ENCOUNTER — Ambulatory Visit: Payer: Medicare Other

## 2017-02-19 DIAGNOSIS — I87323 Chronic venous hypertension (idiopathic) with inflammation of bilateral lower extremity: Secondary | ICD-10-CM | POA: Diagnosis not present

## 2017-02-26 ENCOUNTER — Ambulatory Visit
Admission: RE | Admit: 2017-02-26 | Discharge: 2017-02-26 | Disposition: A | Discharge status: Home / Self Care | Payer: Medicare Other | Source: Ambulatory Visit | Attending: Internal Medicine | Admitting: Internal Medicine

## 2017-02-26 DIAGNOSIS — Z1231 Encounter for screening mammogram for malignant neoplasm of breast: Secondary | ICD-10-CM

## 2017-03-05 ENCOUNTER — Encounter (HOSPITAL_BASED_OUTPATIENT_CLINIC_OR_DEPARTMENT_OTHER): Payer: Medicare Other | Attending: Surgery

## 2017-03-05 DIAGNOSIS — I890 Lymphedema, not elsewhere classified: Secondary | ICD-10-CM | POA: Insufficient documentation

## 2017-03-05 DIAGNOSIS — Z6841 Body Mass Index (BMI) 40.0 and over, adult: Secondary | ICD-10-CM | POA: Insufficient documentation

## 2017-03-05 DIAGNOSIS — I10 Essential (primary) hypertension: Secondary | ICD-10-CM | POA: Insufficient documentation

## 2017-03-05 DIAGNOSIS — E11622 Type 2 diabetes mellitus with other skin ulcer: Secondary | ICD-10-CM | POA: Insufficient documentation

## 2017-03-05 DIAGNOSIS — L97829 Non-pressure chronic ulcer of other part of left lower leg with unspecified severity: Secondary | ICD-10-CM | POA: Insufficient documentation

## 2017-03-05 DIAGNOSIS — G4730 Sleep apnea, unspecified: Secondary | ICD-10-CM | POA: Insufficient documentation

## 2017-03-12 DIAGNOSIS — L97829 Non-pressure chronic ulcer of other part of left lower leg with unspecified severity: Secondary | ICD-10-CM | POA: Diagnosis not present

## 2017-03-12 DIAGNOSIS — I10 Essential (primary) hypertension: Secondary | ICD-10-CM | POA: Diagnosis not present

## 2017-03-12 DIAGNOSIS — G4730 Sleep apnea, unspecified: Secondary | ICD-10-CM | POA: Diagnosis not present

## 2017-03-12 DIAGNOSIS — Z6841 Body Mass Index (BMI) 40.0 and over, adult: Secondary | ICD-10-CM | POA: Diagnosis not present

## 2017-03-12 DIAGNOSIS — E11622 Type 2 diabetes mellitus with other skin ulcer: Secondary | ICD-10-CM | POA: Diagnosis present

## 2017-03-12 DIAGNOSIS — I890 Lymphedema, not elsewhere classified: Secondary | ICD-10-CM | POA: Diagnosis not present

## 2017-03-17 ENCOUNTER — Ambulatory Visit: Payer: Medicare Other | Admitting: Podiatry

## 2017-03-24 ENCOUNTER — Ambulatory Visit: Payer: Medicare Other | Admitting: Podiatry

## 2017-03-24 ENCOUNTER — Encounter: Payer: Self-pay | Admitting: Podiatry

## 2017-03-24 DIAGNOSIS — M79675 Pain in left toe(s): Secondary | ICD-10-CM

## 2017-03-24 DIAGNOSIS — B351 Tinea unguium: Secondary | ICD-10-CM | POA: Diagnosis not present

## 2017-03-24 DIAGNOSIS — M79676 Pain in unspecified toe(s): Secondary | ICD-10-CM

## 2017-03-24 DIAGNOSIS — M79674 Pain in right toe(s): Secondary | ICD-10-CM

## 2017-03-24 NOTE — Patient Instructions (Signed)

## 2017-03-24 NOTE — Progress Notes (Signed)
Patient ID: Elizabeth [name], female   DOB: 03/22/1941, 76 y.o.   MRN: [id number]    Subjective:  This patient says today complaining of painful toenails. Walking wearing shoes and request toenail debridement She was last seen for similar problem on 06/21/2013  Patient is diabetic without history of foot ulceration, claudication or amputation  Patient's daughter present in treatment room today  Objective:  Vascular:  Bilateral nonpitting peripheral edema  DP pulses 2/4 bilaterally  PT pulses 1/4 bilaterally  Capillary reflex immediate bilaterally  Neurological:  Sensation to 10 g monofilament wire intact 5/5 bilaterally  Vibratory sensation reactive bilaterally  Ankle reflex equal and reactive bilaterally  Dermatological:  Bilateral lower extremity compression dressings Elongated, hypertrophic, discolored toenails that are tender to palpation 6-10  Assessment:  Symptomatic onychomycoses x10  Type II diabetic  Chronic venous insufficiency Plan:  Nails x10 are debrided mechanically and electronically without any bleeding  Reappoint 3 months

## 2017-03-26 ENCOUNTER — Encounter (HOSPITAL_BASED_OUTPATIENT_CLINIC_OR_DEPARTMENT_OTHER): Payer: Medicare Other | Attending: Surgery

## 2017-03-26 DIAGNOSIS — I87331 Chronic venous hypertension (idiopathic) with ulcer and inflammation of right lower extremity: Secondary | ICD-10-CM | POA: Diagnosis not present

## 2017-03-26 DIAGNOSIS — I87322 Chronic venous hypertension (idiopathic) with inflammation of left lower extremity: Secondary | ICD-10-CM | POA: Insufficient documentation

## 2017-03-26 DIAGNOSIS — E11622 Type 2 diabetes mellitus with other skin ulcer: Secondary | ICD-10-CM | POA: Insufficient documentation

## 2017-03-26 DIAGNOSIS — L97212 Non-pressure chronic ulcer of right calf with fat layer exposed: Secondary | ICD-10-CM | POA: Diagnosis not present

## 2017-03-26 DIAGNOSIS — I890 Lymphedema, not elsewhere classified: Secondary | ICD-10-CM | POA: Insufficient documentation

## 2017-04-09 DIAGNOSIS — E11622 Type 2 diabetes mellitus with other skin ulcer: Secondary | ICD-10-CM | POA: Diagnosis not present

## 2017-06-23 ENCOUNTER — Ambulatory Visit: Payer: Medicare Other | Admitting: Podiatry

## 2017-07-29 NOTE — Progress Notes (Deleted)
Established Venous Insufficiency   History of Present Illness   Elizabeth [name] is a 77 y.o. (02/09/1941) female who presents with chief complaint: ***.  The patient's symptoms have *** progressed.  The patient's symptoms are: ***.  The patient is ***compliant with compression stockings.  Pt returns for BLE ABI.  The patient's PMH, PSH, SH, and FamHx were reviewed on 07/30/17 are unchanged from 01/17/17.  Current Outpatient Medications  Medication Sig Dispense Refill  . albuterol (PROVENTIL HFA;VENTOLIN HFA) 108 (90 BASE) MCG/ACT inhaler Inhale 2 puffs into the lungs every 6 (six) hours as needed. For bronchitis    . albuterol (PROVENTIL) (2.5 MG/3ML) 0.083% nebulizer solution Take 2.5 mg by nebulization every 6 (six) hours as needed. For bronchitis    . budesonide-formoterol (SYMBICORT) 160-4.5 MCG/ACT inhaler Inhale 2 puffs into the lungs 2 (two) times daily.    . Cetirizine HCl (ZYRTEC PO) Take by mouth daily.    . cholecalciferol (VITAMIN D) 1000 UNITS tablet Take 1,000 Units by mouth daily.    . cyclobenzaprine (FLEXERIL) 10 MG tablet Take 10 mg by mouth as needed for muscle spasms.    Marland Kitchen. econazole nitrate 1 % cream Apply topically daily. Apply to skin on feet daily x 30 days 85 g 0  . fluticasone (FLONASE) 50 MCG/ACT nasal spray Place 2 sprays into the nose as needed.    . gabapentin (NEURONTIN) 100 MG capsule     . hydrochlorothiazide (HYDRODIURIL) 25 MG tablet Take 25 mg by mouth daily.    Marland Kitchen. lisinopril (PRINIVIL,ZESTRIL) 40 MG tablet Take 20 mg by mouth daily.    Marland Kitchen. losartan (COZAAR) 25 MG tablet     . metFORMIN (GLUCOPHAGE) 500 MG tablet Take 500 mg by mouth 2 (two) times daily with a meal.    . Multiple Vitamins-Minerals (CENTRUM SILVER PO) Take 1 tablet by mouth.    . oxybutynin (DITROPAN) 5 MG tablet     . pantoprazole (PROTONIX) 40 MG tablet Take 40 mg by mouth daily.     No current facility-administered medications for this visit.     No Known Allergies  On ROS today:  ***, ***   Physical Examination  ***There were no vitals filed for this visit. ***There is no height or weight on file to calculate BMI.  General {LOC:19197::"Somulent","Alert"}, {Orientation:19197::"Confused","O x 3"}, {Weight:19197::"Obese","Cachectic","WD"}, {General state of health:19197::"Ill appearing","Elderly","NAD"}  Pulmonary {Chest wall:19197::"Asx chest movement","Sym exp"}, {Air movt:19197::"Decreased *** air movt","good B air movt"}, {BS:19197::"rales on ***","rhonchi on ***","wheezing on ***","CTA B"}  Cardiac {Rhythm:19197::"Irregularly, irregular rate and rhythm","RRR, Nl S1, S2"}, {Murmur:19197::"Murmur present: ***","no Murmurs"}, {Rubs:19197::"Rub present: ***","No rubs"}, {Gallop:19197::"Gallop present: ***","No S3,S4"}  Vascular Vessel Right Left  Radial {Palpable:19197::"Not palpable","Faintly palpable","Palpable"} {Palpable:19197::"Not palpable","Faintly palpable","Palpable"}  Brachial {Palpable:19197::"Not palpable","Faintly palpable","Palpable"} {Palpable:19197::"Not palpable","Faintly palpable","Palpable"}  Carotid Palpable, {Bruit:19197::"Bruit present","No Bruit"} Palpable, {Bruit:19197::"Bruit present","No Bruit"}  Aorta Not palpable N/A  Femoral {Palpable:19197::"Not palpable","Faintly palpable","Palpable"} {Palpable:19197::"Not palpable","Faintly palpable","Palpable"}  Popliteal {Palpable:19197::"Prominently palpable","Not palpable"} {Palpable:19197::"Prominently palpable","Not palpable"}  PT {Palpable:19197::"Not palpable","Faintly palpable","Palpable"} {Palpable:19197::"Not palpable","Faintly palpable","Palpable"}  DP {Palpable:19197::"Not palpable","Faintly palpable","Palpable"} {Palpable:19197::"Not palpable","Faintly palpable","Palpable"}    Gastro- intestinal soft, {Distension:19197::"distended","non-distended"}, {TTP:19197::"TTP in *** quadrant","appropriate tenderness to palpation","non-tender to palpation"}, {Guarding:19197::"Guarding and rebound in ***  quadrant","No guarding or rebound"}, {HSM:19197::"HSM present","no HSM"}, {Masses:19197::"Mass present: ***","no masses"}, {Flank:19197::"CVAT on ***","Flank bruit present on ***","no CVAT B"}, {AAA:19197::"Palpable prominent aortic pulse","No palpable prominent aortic pulse"}, {Surgical incision:19197::"Surg incisions: ***","Surgical incisions well healed"," "}  Musculo- skeletal M/S 5/5 throughout {MS:19197::"except ***"," "}, Extremities without ischemic changes {MS:19197::"except ***"," "}, {Edema:19197::"Pitting edema present: ***","Non-pitting edema present: ***","No  edema present"}, {Varicosities:19197::"Varicosities present: ***","No visible varicosities "}, {LDS:19197::"Lipodermatosclerosis present: ***","No Lipodermatosclerosis present"}  Neurologic Cranial nerves 2-12 intact{CN:19197::" except ***"," "}, Pain and light touch intact in extremities{CN:19197::" except for decreased sensation in ***"," "}, Motor exam as listed above     Non-Invasive Vascular Imaging   BLE Venous Insufficiency Duplex (***):   RLE:   *** DVT and SVT,   *** GSV reflux,   *** SSV reflux,  *** deep venous reflux  LLE:  *** DVT and SVT,   *** GSV reflux,   *** SSV reflux,  *** deep venous reflux   Medical Decision Making   Elizabeth [name] is a 77 y.o. female who presents with: BLE phlebolymphedema (C5), B VSU,    Based on the patient's vascular studies and examination, I have offered the patient: ***.  I discussed with the patient the use of her 20-30 mm thigh high compression stockings ***and need for 3 month trial of such.  ***After a 3 months, she will follow up with my partners for evaluation in the [facility]  Thank you for allowing [place] to participate in this patient's care.   Leonides Sake, MD, FACS [facility] Office: [phone] Pager: [phone]

## 2017-07-30 ENCOUNTER — Ambulatory Visit: Payer: Medicare Other | Admitting: Vascular Surgery

## 2017-07-30 ENCOUNTER — Encounter (HOSPITAL_COMMUNITY): Payer: Medicare Other

## 2017-09-17 ENCOUNTER — Ambulatory Visit: Payer: Medicare Other | Admitting: Vascular Surgery

## 2017-09-17 ENCOUNTER — Encounter (HOSPITAL_COMMUNITY): Payer: Medicare Other

## 2017-09-17 ENCOUNTER — Inpatient Hospital Stay (HOSPITAL_COMMUNITY): Admission: RE | Admit: 2017-09-17 | Payer: Medicare Other | Source: Ambulatory Visit

## 2017-11-11 NOTE — Progress Notes (Deleted)
Established Venous Insufficiency   History of Present Illness   Elizabeth [name] is a 77 y.o. 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  The patient's symptoms have *** progressed.  The patient's symptoms are: ***.  The patient is ***compliant with compression stockings.  This patient present for repeat RLE arterial duplex.  The patient's PMH, PSH, SH, and FamHx were reviewed on 11/12/17 are unchanged from 01/17/17.  Current Outpatient Medications  Medication Sig Dispense Refill  . albuterol (PROVENTIL HFA;VENTOLIN HFA) 108 (90 BASE) MCG/ACT inhaler Inhale 2 puffs into the lungs every 6 (six) hours as needed. For bronchitis    . albuterol (PROVENTIL) (2.5 MG/3ML) 0.083% nebulizer solution Take 2.5 mg by nebulization every 6 (six) hours as needed. For bronchitis    . budesonide-formoterol (SYMBICORT) 160-4.5 MCG/ACT inhaler Inhale 2 puffs into the lungs 2 (two) times daily.    . Cetirizine HCl (ZYRTEC PO) Take by mouth daily.    . cholecalciferol (VITAMIN D) 1000 UNITS tablet Take 1,000 Units by mouth daily.    . cyclobenzaprine (FLEXERIL) 10 MG tablet Take 10 mg by mouth as needed for muscle spasms.    Marland Kitchen econazole nitrate 1 % cream Apply topically daily. Apply to skin on feet daily x 30 days 85 g 0  . fluticasone (FLONASE) 50 MCG/ACT nasal spray Place 2 sprays into the nose as needed.    . gabapentin (NEURONTIN) 100 MG capsule     . hydrochlorothiazide (HYDRODIURIL) 25 MG tablet Take 25 mg by mouth daily.    Marland Kitchen lisinopril (PRINIVIL,ZESTRIL) 40 MG tablet Take 20 mg by mouth daily.    Marland Kitchen losartan (COZAAR) 25 MG tablet     . metFORMIN (GLUCOPHAGE) 500 MG tablet Take 500 mg by mouth 2 (two) times daily with a meal.    . Multiple Vitamins-Minerals (CENTRUM SILVER PO) Take 1 tablet by mouth.    . oxybutynin (DITROPAN) 5 MG tablet     . pantoprazole (PROTONIX) 40 MG tablet Take 40 mg by mouth daily.     No current facility-administered medications for  this visit.     No Known Allergies  On ROS today: ***, ***   Physical Examination  ***There were no vitals filed for this visit. ***There is no height or weight on file to calculate BMI.  General {LOC:19197::"Somulent","Alert"}, {Orientation:19197::"Confused","O x 3"}, {Weight:19197::"Obese","Cachectic","WD"}, {General state of health:19197::"Ill appearing","Elderly","NAD"}  Pulmonary {Chest wall:19197::"Asx chest movement","Sym exp"}, {Air movt:19197::"Decreased *** air movt","good B air movt"}, {BS:19197::"rales on ***","rhonchi on ***","wheezing on ***","CTA B"}  Cardiac {Rhythm:19197::"Irregularly, irregular rate and rhythm","RRR, Nl S1, S2"}, {Murmur:19197::"Murmur present: ***","no Murmurs"}, {Rubs:19197::"Rub present: ***","No rubs"}, {Gallop:19197::"Gallop present: ***","No S3,S4"}  Vascular Vessel Right Left  Radial {Palpable:19197::"Not palpable","Faintly palpable","Palpable"} {Palpable:19197::"Not palpable","Faintly palpable","Palpable"}  Brachial {Palpable:19197::"Not palpable","Faintly palpable","Palpable"} {Palpable:19197::"Not palpable","Faintly palpable","Palpable"}  Carotid Palpable, {Bruit:19197::"Bruit present","No Bruit"} Palpable, {Bruit:19197::"Bruit present","No Bruit"}  Aorta Not palpable N/A  Femoral {Palpable:19197::"Not palpable","Faintly palpable","Palpable"} {Palpable:19197::"Not palpable","Faintly palpable","Palpable"}  Popliteal {Palpable:19197::"Prominently palpable","Not palpable"} {Palpable:19197::"Prominently palpable","Not palpable"}  PT {Palpable:19197::"Not palpable","Faintly palpable","Palpable"} {Palpable:19197::"Not palpable","Faintly palpable","Palpable"}  DP {Palpable:19197::"Not palpable","Faintly palpable","Palpable"} {Palpable:19197::"Not palpable","Faintly palpable","Palpable"}    Gastro- intestinal soft, {Distension:19197::"distended","non-distended"}, {TTP:19197::"TTP in *** quadrant","appropriate tenderness to palpation","non-tender to  palpation"}, {Guarding:19197::"Guarding and rebound in *** quadrant","No guarding or rebound"}, {HSM:19197::"HSM present","no HSM"}, {Masses:19197::"Mass present: ***","no masses"}, {Flank:19197::"CVAT on ***","Flank bruit present on ***","no CVAT B"}, {AAA:19197::"Palpable prominent aortic pulse","No palpable prominent aortic pulse"}, {Surgical incision:19197::"Surg incisions: ***","Surgical incisions well healed"," "}  Musculo- skeletal M/S 5/5 throughout {MS:19197::"except ***"," "}, Extremities without ischemic changes {MS:19197::"except ***"," "}, {Edema:19197::"Pitting  edema present: ***","Non-pitting edema present: ***","No edema present"}, {Varicosities:19197::"Varicosities present: ***","No visible varicosities "}, {LDS:19197::"Lipodermatosclerosis present: ***","No Lipodermatosclerosis present"}  Neurologic Cranial nerves 2-12 intact{CN:19197::" except ***"," "}, Pain and light touch intact in extremities{CN:19197::" except for decreased sensation in ***"," "}, Motor exam as listed above     Non-Invasive Vascular Imaging   BLE Venous Insufficiency Duplex (***):   RLE:   *** DVT and SVT,   *** GSV reflux,   *** SSV reflux,  *** deep venous reflux  LLE:  *** DVT and SVT,   *** GSV reflux,   *** SSV reflux,  *** deep venous reflux   Medical Decision Making   Elizabeth [name] is a 77 y.o. female who presents with: BLE CVI (C5), B VSU, phlebolymphedema   ***  Thank you for allowing us to participate in this patient's care.   Leonides SakeBrian Raffaele Derise, MD, FACS [facility] Office: [phone] Pager: [phone]

## 2017-11-12 ENCOUNTER — Ambulatory Visit: Payer: Medicare Other | Admitting: Vascular Surgery

## 2017-11-12 ENCOUNTER — Encounter (HOSPITAL_COMMUNITY): Payer: Medicare Other

## 2017-12-15 ENCOUNTER — Encounter: Payer: Self-pay | Admitting: Vascular Surgery

## 2017-12-15 ENCOUNTER — Ambulatory Visit (HOSPITAL_COMMUNITY): Payer: Medicare Other | Attending: Vascular Surgery

## 2017-12-15 ENCOUNTER — Ambulatory Visit (HOSPITAL_COMMUNITY): Admission: RE | Admit: 2017-12-15 | Payer: Medicare Other | Source: Ambulatory Visit

## 2017-12-15 ENCOUNTER — Ambulatory Visit: Payer: Medicare Other

## 2017-12-15 NOTE — Progress Notes (Deleted)
HISTORY AND PHYSICAL     CC:  F/u with ABI's Requesting Provider:  Fleet Contras, MD  HPI: This is a 77 y.o. female who was seen by Dr. Imogene Burn almost a year ago for bilateral leg ulcers.  Pt had noted onset of swelling 4-6 months prior to her visit with no obvious triggers.  The pt's sx consisted of weeping wounds on both legs.  The pt had no hx of DVT, known hx of pregnancy, known hx of varicose veins, known hx of vensou stasis ulcers, no hx of lymphedema and known hx of skin changes in the lower legs.  There is limited family hx of venous disorders.  She has used compression prior to seeing Dr. Imogene Burn.   ***  Past Medical History:  Diagnosis Date  . Arthritis   . Asthma   . Breast mass in female   . Bronchitis   . Bronchitis   . Constipation   . Cough   . Diabetes mellitus   . GERD (gastroesophageal reflux disease)   . Hypertension   . Wheezing     Past Surgical History:  Procedure Laterality Date  . ABDOMINAL HYSTERECTOMY  1996  . CYSTECTOMY  1980's   back of neck  . THYROID SURGERY  2001    No Known Allergies  Current Outpatient Medications  Medication Sig Dispense Refill  . albuterol (PROVENTIL HFA;VENTOLIN HFA) 108 (90 BASE) MCG/ACT inhaler Inhale 2 puffs into the lungs every 6 (six) hours as needed. For bronchitis    . albuterol (PROVENTIL) (2.5 MG/3ML) 0.083% nebulizer solution Take 2.5 mg by nebulization every 6 (six) hours as needed. For bronchitis    . budesonide-formoterol (SYMBICORT) 160-4.5 MCG/ACT inhaler Inhale 2 puffs into the lungs 2 (two) times daily.    . Cetirizine HCl (ZYRTEC PO) Take by mouth daily.    . cholecalciferol (VITAMIN D) 1000 UNITS tablet Take 1,000 Units by mouth daily.    . cyclobenzaprine (FLEXERIL) 10 MG tablet Take 10 mg by mouth as needed for muscle spasms.    Marland Kitchen econazole nitrate 1 % cream Apply topically daily. Apply to skin on feet daily x 30 days 85 g 0  . fluticasone (FLONASE) 50 MCG/ACT nasal spray Place 2 sprays into the nose  as needed.    . gabapentin (NEURONTIN) 100 MG capsule     . hydrochlorothiazide (HYDRODIURIL) 25 MG tablet Take 25 mg by mouth daily.    Marland Kitchen lisinopril (PRINIVIL,ZESTRIL) 40 MG tablet Take 20 mg by mouth daily.    Marland Kitchen losartan (COZAAR) 25 MG tablet     . metFORMIN (GLUCOPHAGE) 500 MG tablet Take 500 mg by mouth 2 (two) times daily with a meal.    . Multiple Vitamins-Minerals (CENTRUM SILVER PO) Take 1 tablet by mouth.    . oxybutynin (DITROPAN) 5 MG tablet     . pantoprazole (PROTONIX) 40 MG tablet Take 40 mg by mouth daily.     No current facility-administered medications for this visit.     Family History  Problem Relation Age of Onset  . Diabetes Mother   . Heart disease Mother   . Cancer Maternal Uncle     Social History   Socioeconomic History  . Marital status: Married    Spouse name: Not on file  . Number of children: Not on file  . Years of education: Not on file  . Highest education level: Not on file  Occupational History  . Not on file  Social Needs  . Financial resource strain:  Not on file  . Food insecurity:    Worry: Not on file    Inability: Not on file  . Transportation needs:    Medical: Not on file    Non-medical: Not on file  Tobacco Use  . Smoking status: Former Smoker    Last attempt to quit: 01/17/1964    Years since quitting: 53.9  . Smokeless tobacco: Never Used  Substance and Sexual Activity  . Alcohol use: No  . Drug use: No  . Sexual activity: Not on file  Lifestyle  . Physical activity:    Days per week: Not on file    Minutes per session: Not on file  . Stress: Not on file  Relationships  . Social connections:    Talks on phone: Not on file    Gets together: Not on file    Attends religious service: Not on file    Active member of club or organization: Not on file    Attends meetings of clubs or organizations: Not on file    Relationship status: Not on file  . Intimate partner violence:    Fear of current or ex partner: Not on file      Emotionally abused: Not on file    Physically abused: Not on file    Forced sexual activity: Not on file  Other Topics Concern  . Not on file  Social History Narrative  . Not on file     REVIEW OF SYSTEMS:  *** [X]  denotes positive finding, [ ]  denotes negative finding Cardiac  Comments:  Chest pain or chest pressure:    Shortness of breath upon exertion:    Short of breath when lying flat:    Irregular heart rhythm:        Vascular    Pain in calf, thigh, or hip brought on by ambulation:    Pain in feet at night that wakes you up from your sleep:     Blood clot in your veins:    Leg swelling:         Pulmonary    Oxygen at home:    Productive cough:     Wheezing:         Neurologic    Sudden weakness in arms or legs:     Sudden numbness in arms or legs:     Sudden onset of difficulty speaking or slurred speech:    Temporary loss of vision in one eye:     Problems with dizziness:         Gastrointestinal    Blood in stool:     Vomited blood:         Genitourinary    Burning when urinating:     Blood in urine:        Psychiatric    Major depression:         Hematologic    Bleeding problems:    Problems with blood clotting too easily:        Skin    Rashes or ulcers:        Constitutional    Fever or chills:      PHYSICAL EXAMINATION:  *** General:  WDWN in NAD; vital signs documented above Gait: Not observed HENT: WNL, normocephalic Pulmonary: normal non-labored breathing , without Rales, rhonchi,  wheezing Cardiac: {Desc; regular/irreg:14544} HR, without  Murmurs {With/Without:20273} carotid bruit*** Abdomen: soft, NT, no masses Skin: {With/Without:20273} rashes Vascular Exam/Pulses:  Right Left  Radial {Exam; arterial pulse strength 0-4:30167} {  Exam; arterial pulse strength 0-4:30167}  Ulnar {Exam; arterial pulse strength 0-4:30167} {Exam; arterial pulse strength 0-4:30167}  Brachial {Exam; arterial pulse strength 0-4:30167} {Exam;  arterial pulse strength 0-4:30167}  Femoral {Exam; arterial pulse strength 0-4:30167} {Exam; arterial pulse strength 0-4:30167}  Popliteal {Exam; arterial pulse strength 0-4:30167} {Exam; arterial pulse strength 0-4:30167}  DP {Exam; arterial pulse strength 0-4:30167} {Exam; arterial pulse strength 0-4:30167}  PT {Exam; arterial pulse strength 0-4:30167} {Exam; arterial pulse strength 0-4:30167}  Peroneal {Exam; arterial pulse strength 0-4:30167} {Exam; arterial pulse strength 0-4:30167}   Extremities: {With/Without:20273} ischemic changes, {With/Without:20273} Gangrene , {With/Without:20273} cellulitis; {With/Without:20273} open wounds;  Musculoskeletal: no muscle wasting or atrophy  Neurologic: A&O X 3;  No focal weakness or paresthesias are detected Psychiatric:  The pt has {Desc; normal/abnormal:11317::"Normal"} affect.   Non-Invasive Vascular Imaging:   ***  BLE Venous Insufficiency Duplex (12/03/16):   RLE:  ? no DVT and SVT,  ? no GSV reflux,  ? + SSV reflux: 3.1-4.3 mm ? + deep venous reflux: CFV  LLE: ? no DVT and SVT,  ? no GSV reflux,  ? no SSV reflux, ? + deep venous reflux: SFJ, CFV  BLE Arterial Duplex (12/03/16)  R: biphasic except PT, pop ?108/310 c/s  L: biphasic except PT   Pt meds includes: Statin:  {yes no:314532} Beta Blocker:  {yes no:314532} Aspirin:  {yes no:314532} ACEI:  {yes no:314532} ARB:  {yes no:314532} CCB use:  {yes/no:20286} Other Antiplatelet/Anticoagulant:  {yes/no:20286}***   ASSESSMENT/PLAN:: 77 y.o. female with hx of BLE chronic venous insufficiency (C5).   -***   Doreatha MassedSamantha Timouthy Gilardi, PA-C [facility] [phone]  Clinic MD:  Pt seen and examined with Dr. Marland Kitchen***

## 2018-05-27 ENCOUNTER — Other Ambulatory Visit: Payer: Self-pay | Admitting: Internal Medicine

## 2018-05-27 DIAGNOSIS — Z1231 Encounter for screening mammogram for malignant neoplasm of breast: Secondary | ICD-10-CM

## 2018-06-23 ENCOUNTER — Ambulatory Visit: Payer: Medicare Other

## 2018-07-22 ENCOUNTER — Ambulatory Visit: Payer: Medicare Other

## 2018-09-11 ENCOUNTER — Ambulatory Visit: Payer: Medicare Other

## 2018-10-03 ENCOUNTER — Ambulatory Visit: Payer: Medicare Other

## 2019-10-29 ENCOUNTER — Other Ambulatory Visit: Payer: Self-pay | Admitting: Family Medicine

## 2019-10-29 DIAGNOSIS — Z1231 Encounter for screening mammogram for malignant neoplasm of breast: Secondary | ICD-10-CM

## 2019-11-19 ENCOUNTER — Ambulatory Visit: Payer: Medicare Other

## 2020-01-13 ENCOUNTER — Ambulatory Visit
Admission: RE | Admit: 2020-01-13 | Discharge: 2020-01-13 | Disposition: A | Discharge status: Home / Self Care | Payer: Medicare Other | Source: Ambulatory Visit | Attending: Family Medicine | Admitting: Family Medicine

## 2020-01-13 ENCOUNTER — Other Ambulatory Visit: Payer: Self-pay

## 2020-01-13 DIAGNOSIS — Z1231 Encounter for screening mammogram for malignant neoplasm of breast: Secondary | ICD-10-CM

## 2020-03-22 ENCOUNTER — Ambulatory Visit: Payer: Medicare Other | Admitting: Podiatry

## 2020-11-08 ENCOUNTER — Ambulatory Visit: Payer: Medicare Other | Admitting: Podiatry

## 2020-11-29 ENCOUNTER — Ambulatory Visit: Payer: Medicare Other | Admitting: Podiatry

## 2020-12-04 ENCOUNTER — Other Ambulatory Visit: Payer: Self-pay | Admitting: Family Medicine

## 2020-12-04 DIAGNOSIS — Z1231 Encounter for screening mammogram for malignant neoplasm of breast: Secondary | ICD-10-CM

## 2020-12-06 ENCOUNTER — Ambulatory Visit (INDEPENDENT_AMBULATORY_CARE_PROVIDER_SITE_OTHER): Payer: Medicare Other | Admitting: Podiatry

## 2020-12-06 ENCOUNTER — Other Ambulatory Visit: Payer: Self-pay

## 2020-12-06 DIAGNOSIS — M79675 Pain in left toe(s): Secondary | ICD-10-CM | POA: Diagnosis not present

## 2020-12-06 DIAGNOSIS — B351 Tinea unguium: Secondary | ICD-10-CM | POA: Diagnosis not present

## 2020-12-06 DIAGNOSIS — M79674 Pain in right toe(s): Secondary | ICD-10-CM

## 2020-12-06 NOTE — Progress Notes (Signed)
   SUBJECTIVE Patient with a history of diabetes mellitus presents to office today complaining of elongated, thickened nails that cause pain while ambulating in shoes.  Patient is unable to trim their own nails. Patient is here for further evaluation and treatment.   Past Medical History:  Diagnosis Date   Arthritis    Asthma    Breast mass in female    Bronchitis    Bronchitis    Constipation    Cough    Diabetes mellitus    GERD (gastroesophageal reflux disease)    Hypertension    Wheezing     OBJECTIVE General Patient is awake, alert, and oriented x 3 and in no acute distress. Derm Skin is dry and supple bilateral. Negative open lesions or macerations. Remaining integument unremarkable. Nails are tender, long, thickened and dystrophic with subungual debris, consistent with onychomycosis, 1-5 bilateral. No signs of infection noted. Vasc  DP and PT pedal pulses palpable bilaterally. Temperature gradient within normal limits.  Neuro Epicritic and protective threshold sensation diminished bilaterally.  Musculoskeletal Exam No symptomatic pedal deformities noted bilateral. Muscular strength within normal limits.  ASSESSMENT 1. Diabetes Mellitus w/ peripheral neuropathy 2.  Pain due to onychomycosis of toenails bilateral  PLAN OF CARE 1. Patient evaluated today. 2. Instructed to maintain good pedal hygiene and foot care. Stressed importance of controlling blood sugar.  3. Mechanical debridement of nails 1-5 bilaterally performed using a nail nipper. Filed with dremel without incident.  4. Return to clinic in 3 mos.     Felecia Shelling, DPM [facility]  Dr. Felecia Shelling, DPM    [address] [place], [place] [address]                Office [phone]  Fax [phone]

## 2021-01-19 ENCOUNTER — Ambulatory Visit: Payer: Medicare Other

## 2021-02-26 ENCOUNTER — Ambulatory Visit: Payer: Medicare Other

## 2021-03-08 ENCOUNTER — Ambulatory Visit: Payer: Medicare Other

## 2021-04-11 ENCOUNTER — Ambulatory Visit: Payer: Medicare Other

## 2021-05-08 ENCOUNTER — Encounter (HOSPITAL_COMMUNITY): Payer: Self-pay

## 2021-05-08 ENCOUNTER — Emergency Department (HOSPITAL_COMMUNITY): Payer: Medicare Other

## 2021-05-08 ENCOUNTER — Other Ambulatory Visit: Payer: Self-pay

## 2021-05-08 ENCOUNTER — Emergency Department (HOSPITAL_COMMUNITY)
Admission: EM | Admit: 2021-05-08 | Discharge: 2021-05-09 | Disposition: A | Discharge status: Home / Self Care | Payer: Medicare Other | Attending: Emergency Medicine | Admitting: Emergency Medicine

## 2021-05-08 DIAGNOSIS — R1030 Lower abdominal pain, unspecified: Secondary | ICD-10-CM | POA: Diagnosis not present

## 2021-05-08 DIAGNOSIS — R591 Generalized enlarged lymph nodes: Secondary | ICD-10-CM | POA: Diagnosis not present

## 2021-05-08 DIAGNOSIS — Z20822 Contact with and (suspected) exposure to covid-19: Secondary | ICD-10-CM | POA: Diagnosis not present

## 2021-05-08 DIAGNOSIS — Z7984 Long term (current) use of oral hypoglycemic drugs: Secondary | ICD-10-CM | POA: Insufficient documentation

## 2021-05-08 DIAGNOSIS — Z79899 Other long term (current) drug therapy: Secondary | ICD-10-CM | POA: Diagnosis not present

## 2021-05-08 DIAGNOSIS — R053 Chronic cough: Secondary | ICD-10-CM | POA: Diagnosis not present

## 2021-05-08 DIAGNOSIS — R220 Localized swelling, mass and lump, head: Secondary | ICD-10-CM | POA: Insufficient documentation

## 2021-05-08 DIAGNOSIS — M79605 Pain in left leg: Secondary | ICD-10-CM | POA: Insufficient documentation

## 2021-05-08 DIAGNOSIS — Z7951 Long term (current) use of inhaled steroids: Secondary | ICD-10-CM | POA: Diagnosis not present

## 2021-05-08 LAB — RESP PANEL BY RT-PCR (FLU A&B, COVID) ARPGX2
Influenza A by PCR: NEGATIVE
Influenza B by PCR: NEGATIVE
SARS Coronavirus 2 by RT PCR: NEGATIVE

## 2021-05-08 LAB — CBC
HCT: 40.2 % (ref 36.0–46.0)
Hemoglobin: 13 g/dL (ref 12.0–15.0)
MCH: 31.1 pg (ref 26.0–34.0)
MCHC: 32.3 g/dL (ref 30.0–36.0)
MCV: 96.2 fL (ref 80.0–100.0)
Platelets: 201 10*3/uL (ref 150–400)
RBC: 4.18 MIL/uL (ref 3.87–5.11)
RDW: 13.5 % (ref 11.5–15.5)
WBC: 6.2 10*3/uL (ref 4.0–10.5)
nRBC: 0 % (ref 0.0–0.2)

## 2021-05-08 LAB — URINALYSIS, ROUTINE W REFLEX MICROSCOPIC
Bilirubin Urine: NEGATIVE
Glucose, UA: NEGATIVE mg/dL
Hgb urine dipstick: NEGATIVE
Ketones, ur: NEGATIVE mg/dL
Leukocytes,Ua: NEGATIVE
Nitrite: NEGATIVE
Protein, ur: NEGATIVE mg/dL
Specific Gravity, Urine: 1.017 (ref 1.005–1.030)
pH: 6 (ref 5.0–8.0)

## 2021-05-08 LAB — BASIC METABOLIC PANEL
Anion gap: 10 (ref 5–15)
BUN: 26 mg/dL — ABNORMAL HIGH (ref 8–23)
CO2: 26 mmol/L (ref 22–32)
Calcium: 9.6 mg/dL (ref 8.9–10.3)
Chloride: 101 mmol/L (ref 98–111)
Creatinine, Ser: 1.08 mg/dL — ABNORMAL HIGH (ref 0.44–1.00)
GFR, Estimated: 52 mL/min — ABNORMAL LOW (ref >60)
Glucose, Bld: 107 mg/dL — ABNORMAL HIGH (ref 70–99)
Potassium: 3.9 mmol/L (ref 3.5–5.1)
Sodium: 137 mmol/L (ref 135–145)

## 2021-05-08 LAB — MAGNESIUM: Magnesium: 1.9 mg/dL (ref 1.7–2.4)

## 2021-05-08 MED ORDER — ACETAMINOPHEN 500 MG PO TABS
1000.0000 mg | ORAL_TABLET | Freq: Once | ORAL | Status: AC
  Administered 2021-05-08: 1000 mg via ORAL
  Filled 2021-05-08: qty 2

## 2021-05-08 MED ORDER — IBUPROFEN 200 MG PO TABS
600.0000 mg | ORAL_TABLET | Freq: Once | ORAL | Status: AC
  Administered 2021-05-08: 600 mg via ORAL
  Filled 2021-05-08: qty 3

## 2021-05-08 NOTE — ED Provider Notes (Signed)
[facility]-EMERGENCY DEPT Provider Note   CSN: [id number] Arrival date & time: 05/08/21  1355     History  Chief Complaint  Patient presents with   Joint Swelling   Groin Pain    Elizabeth [name] is a 81 y.o. female with history of chronic lymphedema presenting to the emergency department with pain of her left leg.  The patient reports that she has a swelling of the inguinal region on the left side for several days, as well as painful swelling on the right side of her neck for a few days.  She reports she has a chronic dry cough.  No worsening dyspnea or shortness of breath, no posterior leg pain.  She does have some weeping wound at the bottom of her left ankle.  She does take a diuretic twice a day and is compliant with that.  She denies history of blood clots and is not on blood thinners.  HPI     Home Medications Prior to Admission medications   Medication Sig Start Date End Date Taking? Authorizing Provider  albuterol (PROVENTIL HFA;VENTOLIN HFA) 108 (90 BASE) MCG/ACT inhaler Inhale 2 puffs into the lungs every 6 (six) hours as needed. For bronchitis    [provider]  albuterol (PROVENTIL) (2.5 MG/3ML) 0.083% nebulizer solution Take 2.5 mg by nebulization every 6 (six) hours as needed. For bronchitis    [provider]  budesonide-formoterol (SYMBICORT) 160-4.5 MCG/ACT inhaler Inhale 2 puffs into the lungs 2 (two) times daily.    [provider]  Cetirizine HCl (ZYRTEC PO) Take by mouth daily.    [provider]  cholecalciferol (VITAMIN D) 1000 UNITS tablet Take 1,000 Units by mouth daily.    [provider]  cyclobenzaprine (FLEXERIL) 10 MG tablet Take 10 mg by mouth as needed for muscle spasms.    [provider]  econazole nitrate 1 % cream Apply topically daily. Apply to skin on feet daily x 30 days 02/08/13   Carrington Clamp, DPM  fluticasone (FLONASE) 50 MCG/ACT nasal spray Place 2 sprays into  the nose as needed.    [provider]  gabapentin (NEURONTIN) 100 MG capsule  07/24/16   [provider]  hydrochlorothiazide (HYDRODIURIL) 25 MG tablet Take 25 mg by mouth daily.    [provider]  lisinopril (PRINIVIL,ZESTRIL) 40 MG tablet Take 20 mg by mouth daily.    [provider]  losartan (COZAAR) 25 MG tablet  07/08/16   [provider]  metFORMIN (GLUCOPHAGE) 500 MG tablet Take 500 mg by mouth 2 (two) times daily with a meal.    [provider]  Multiple Vitamins-Minerals (CENTRUM SILVER PO) Take 1 tablet by mouth.    [provider]  oxybutynin (DITROPAN) 5 MG tablet  06/17/16   [provider]  pantoprazole (PROTONIX) 40 MG tablet Take 40 mg by mouth daily.    [provider]      Allergies    Patient has no known allergies.    Review of Systems   Review of Systems  Physical Exam Updated Vital Signs BP (!) 175/75 (BP Location: Left Arm)    Pulse 73    Temp 97.6 F (36.4 C) (Oral)    Resp 18    Ht 5\' 4"  (1.626 m)    Wt 127.5 kg    SpO2 100%    BMI 48.23 kg/m  Physical Exam Constitutional:      General: She is not in acute distress.  Appearance: She is obese.  HENT:     Head: Normocephalic and atraumatic.  Eyes:     Conjunctiva/sclera: Conjunctivae normal.     Pupils: Pupils are equal, round, and reactive to light.  Cardiovascular:     Rate and Rhythm: Normal rate and regular rhythm.  Pulmonary:     Effort: Pulmonary effort is normal. No respiratory distress.  Musculoskeletal:     Comments: Chronic lymphedema of the lower extremities, with some venous stasis dermatitis mild of the lower extremities, weeping wound of the left lateral ankle without evidence of infection.  Positive tender inguinal lymphadenopathy and cervical lymphadenopathy  Skin:    General: Skin is warm and dry.     Comments: No evidence of panniculitis on exam  Neurological:     General: No focal deficit present.      Mental Status: She is alert. Mental status is at baseline.  Psychiatric:        Mood and Affect: Mood normal.        Behavior: Behavior normal.    ED Results / Procedures / Treatments   Labs (all labs ordered are listed, but only abnormal results are displayed) Labs Reviewed  BASIC METABOLIC PANEL - Abnormal; Notable for the following components:      Result Value   Glucose, Bld 107 (*)    BUN 26 (*)    Creatinine, Ser 1.08 (*)    GFR, Estimated 52 (*)    All other components within normal limits  RESP PANEL BY RT-PCR (FLU A&B, COVID) ARPGX2  URINALYSIS, ROUTINE W REFLEX MICROSCOPIC  CBC  MAGNESIUM    EKG None  Radiology DG Chest 2 View  Result Date: 05/08/2021 CLINICAL DATA:  Leg swelling.  Evaluate for pulmonary edema. EXAM: CHEST - 2 VIEW COMPARISON:  None. FINDINGS: The lungs are clear without focal pneumonia, edema, pneumothorax or pleural effusion. Minimal atelectasis noted left base. The cardiopericardial silhouette is within normal limits for size. The visualized bony structures of the thorax show no acute abnormality. IMPRESSION: No active cardiopulmonary disease. Electronically Signed   By: Kennith CenterEric  Mansell M.D.   On: 05/08/2021 15:27    Procedures Procedures    Medications Ordered in ED Medications  ibuprofen (ADVIL) tablet 600 mg (600 mg Oral Given 05/08/21 2154)  acetaminophen (TYLENOL) tablet 1,000 mg (1,000 mg Oral Given 05/08/21 2154)    ED Course/ Medical Decision Making/ A&P                           Medical Decision Making Amount and/or Complexity of Data Reviewed Labs: ordered.  Risk OTC drugs.   This patient presents to the ED with concern for swelling, leg cramping. This involves an extensive number of treatment options, and is a complaint that carries with it a high risk of complications and morbidity.  The differential diagnosis includes lymphadenopathy vs infection vs other  Co-morbidities that complicate the patient evaluation: obesity,  CHF  Additional history obtained from patient's daughter at bedside  I ordered and personally interpreted labs.  The pertinent results include:  BMP and CBC largely at baseline, no AKI, no evidence of UTI on UA   The patient was maintained on a cardiac monitor.  I personally viewed and interpreted the cardiac monitored which showed an underlying rhythm of: NSR  I ordered medication including motrin, tylenol for pain I have reviewed the patients home medicines and have made adjustments as needed  Test Considered:  - Lower suspicion  clinically for DVT, given that patient's leg pain appears localized to inguinal lymphadenopathy.  No posterior leg ttp or cord tenderness.  I think a course of tylenol + occasional NSAID is reasonable, and I advised if she has persistent leg pain to discuss with PCP an outpatient ultrasound.   - Lower suspicion for CHF exacerbation clinically - No evidence of cellulitis on exam.  Mild venous stasis dermatitis of the lower extremities, weeping edema localized to left lower leg   After the interventions noted above, I reevaluated the patient and found that they have: improved  Social Determinants of Health:limited mobility to to weight, habitus - lives with daughter who was present and engaged for care plan  Dispostion:  After consideration of the diagnostic results and the patients response to treatment, I feel that the patent would benefit from close PCP follow up. She has a PCP appointment on 1/31, but I advised they call the office tomorrow to ask about expedited follow up.         Final Clinical Impression(s) / ED Diagnoses Final diagnoses:  Lymphadenopathy    Rx / DC Orders ED Discharge Orders     None         Terald Sleeper, MD 05/09/21 1025

## 2021-05-08 NOTE — ED Provider Triage Note (Signed)
Emergency Medicine Provider Triage Evaluation Note  Elizabeth [name] , a 81 y.o. female  was evaluated in triage.  Pt complains of swelling to bilateral legs and swelling/pain to left groin.  Patient reports that she has had swelling to lower extremities x1 week.  Patient has been taking fluid pill with minimal improvement in her symptoms.  Patient states that she has been having left groin pain and swelling the last month.  Patient states that swelling and pain are worse at night.  Patient reports that she did take her blood pressure medication today.  Review of Systems  Positive: Swelling to bilateral lower legs, pain to left groin Negative: Chest pain, shortness of breath, headache, visual disturbance, dysuria, hematuria, urinary urgency,  Physical Exam  BP (!) 197/102 (BP Location: Left Arm)    Pulse 83    Temp 98 F (36.7 C) (Oral)    Resp 16    Ht 5\' 4"  (1.626 m)    Wt 127.5 kg    SpO2 99%    BMI 48.23 kg/m  Gen:   Awake, no distress   Resp:  Normal effort, lungs clear to auscultation bilaterally MSK:   Moves extremities without difficulty; edema to bilateral lower extremities Other:  Abdomen soft, nondistended, nontender with no guarding or rebound tenderness.  Medical Decision Making  Medically screening exam initiated at 2:41 PM.  Appropriate orders placed.  Anayansi Kanoff Haydu was informed that the remainder of the evaluation will be completed by another provider, this initial triage assessment does not replace that evaluation, and the importance of remaining in the ED until their evaluation is complete.     Loni Beckwith, [place] 05/08/21 1443

## 2021-05-08 NOTE — Discharge Instructions (Addendum)
You can take Tylenol regularly in the daytime, and add 600 mg of ibuprofen or Motrin at night before bedtime, for the next 7 days.  Please call your doctors office to schedule a follow-up appointment

## 2021-05-08 NOTE — ED Notes (Signed)
Attempted to collect labs unsuccessfully.

## 2021-05-08 NOTE — ED Triage Notes (Signed)
Patient c/o bilateral ankle and foot swelling x 1 week. Patient c/o left groin pain and swelling x 1 month.

## 2021-05-10 ENCOUNTER — Ambulatory Visit
Admission: RE | Admit: 2021-05-10 | Discharge: 2021-05-10 | Disposition: A | Discharge status: Home / Self Care | Payer: Medicare Other | Source: Ambulatory Visit | Attending: Family Medicine | Admitting: Family Medicine

## 2021-05-10 DIAGNOSIS — Z1231 Encounter for screening mammogram for malignant neoplasm of breast: Secondary | ICD-10-CM

## 2021-05-12 ENCOUNTER — Observation Stay (HOSPITAL_COMMUNITY)
Admission: EM | Admit: 2021-05-12 | Discharge: 2021-05-14 | Disposition: A | Discharge status: Home / Self Care | Payer: Medicare Other | Attending: Internal Medicine | Admitting: Internal Medicine

## 2021-05-12 ENCOUNTER — Observation Stay (HOSPITAL_COMMUNITY): Payer: Medicare Other

## 2021-05-12 ENCOUNTER — Encounter (HOSPITAL_COMMUNITY): Payer: Self-pay

## 2021-05-12 DIAGNOSIS — I129 Hypertensive chronic kidney disease with stage 1 through stage 4 chronic kidney disease, or unspecified chronic kidney disease: Secondary | ICD-10-CM | POA: Insufficient documentation

## 2021-05-12 DIAGNOSIS — D72819 Decreased white blood cell count, unspecified: Secondary | ICD-10-CM | POA: Diagnosis present

## 2021-05-12 DIAGNOSIS — E1159 Type 2 diabetes mellitus with other circulatory complications: Secondary | ICD-10-CM | POA: Diagnosis present

## 2021-05-12 DIAGNOSIS — E1122 Type 2 diabetes mellitus with diabetic chronic kidney disease: Secondary | ICD-10-CM | POA: Insufficient documentation

## 2021-05-12 DIAGNOSIS — Z87891 Personal history of nicotine dependence: Secondary | ICD-10-CM | POA: Insufficient documentation

## 2021-05-12 DIAGNOSIS — R531 Weakness: Secondary | ICD-10-CM | POA: Diagnosis present

## 2021-05-12 DIAGNOSIS — U071 COVID-19: Secondary | ICD-10-CM | POA: Diagnosis present

## 2021-05-12 DIAGNOSIS — Z7984 Long term (current) use of oral hypoglycemic drugs: Secondary | ICD-10-CM | POA: Insufficient documentation

## 2021-05-12 DIAGNOSIS — K219 Gastro-esophageal reflux disease without esophagitis: Secondary | ICD-10-CM | POA: Insufficient documentation

## 2021-05-12 DIAGNOSIS — N189 Chronic kidney disease, unspecified: Secondary | ICD-10-CM | POA: Diagnosis present

## 2021-05-12 DIAGNOSIS — J45909 Unspecified asthma, uncomplicated: Secondary | ICD-10-CM | POA: Diagnosis present

## 2021-05-12 DIAGNOSIS — Z79899 Other long term (current) drug therapy: Secondary | ICD-10-CM | POA: Insufficient documentation

## 2021-05-12 DIAGNOSIS — N1831 Chronic kidney disease, stage 3a: Secondary | ICD-10-CM | POA: Diagnosis not present

## 2021-05-12 DIAGNOSIS — I152 Hypertension secondary to endocrine disorders: Secondary | ICD-10-CM | POA: Diagnosis present

## 2021-05-12 DIAGNOSIS — N179 Acute kidney failure, unspecified: Secondary | ICD-10-CM | POA: Diagnosis not present

## 2021-05-12 DIAGNOSIS — E119 Type 2 diabetes mellitus without complications: Secondary | ICD-10-CM

## 2021-05-12 DIAGNOSIS — I10 Essential (primary) hypertension: Secondary | ICD-10-CM | POA: Diagnosis present

## 2021-05-12 HISTORY — DX: Type 2 diabetes mellitus without complications: E11.9

## 2021-05-12 HISTORY — DX: Type 2 diabetes mellitus with other circulatory complications: E11.59

## 2021-05-12 HISTORY — DX: Hypertension secondary to endocrine disorders: I15.2

## 2021-05-12 LAB — URINALYSIS, ROUTINE W REFLEX MICROSCOPIC
Bilirubin Urine: NEGATIVE
Glucose, UA: NEGATIVE mg/dL
Ketones, ur: NEGATIVE mg/dL
Leukocytes,Ua: NEGATIVE
Nitrite: NEGATIVE
Protein, ur: NEGATIVE mg/dL
Specific Gravity, Urine: 1.016 (ref 1.005–1.030)
pH: 5 (ref 5.0–8.0)

## 2021-05-12 LAB — CBC WITH DIFFERENTIAL/PLATELET
Abs Immature Granulocytes: 0.01 10*3/uL (ref 0.00–0.07)
Basophils Absolute: 0 10*3/uL (ref 0.0–0.1)
Basophils Relative: 0 %
Eosinophils Absolute: 0 10*3/uL (ref 0.0–0.5)
Eosinophils Relative: 0 %
HCT: 40.2 % (ref 36.0–46.0)
Hemoglobin: 12.8 g/dL (ref 12.0–15.0)
Immature Granulocytes: 0 %
Lymphocytes Relative: 30 %
Lymphs Abs: 0.8 10*3/uL (ref 0.7–4.0)
MCH: 30.3 pg (ref 26.0–34.0)
MCHC: 31.8 g/dL (ref 30.0–36.0)
MCV: 95.3 fL (ref 80.0–100.0)
Monocytes Absolute: 0.5 10*3/uL (ref 0.1–1.0)
Monocytes Relative: 21 %
Neutro Abs: 1.2 10*3/uL — ABNORMAL LOW (ref 1.7–7.7)
Neutrophils Relative %: 49 %
Platelets: 155 10*3/uL (ref 150–400)
RBC: 4.22 MIL/uL (ref 3.87–5.11)
RDW: 13.2 % (ref 11.5–15.5)
WBC: 2.5 10*3/uL — ABNORMAL LOW (ref 4.0–10.5)
nRBC: 0 % (ref 0.0–0.2)

## 2021-05-12 LAB — COMPREHENSIVE METABOLIC PANEL
ALT: 20 U/L (ref 0–44)
AST: 27 U/L (ref 15–41)
Albumin: 3.5 g/dL (ref 3.5–5.0)
Alkaline Phosphatase: 55 U/L (ref 38–126)
Anion gap: 11 (ref 5–15)
BUN: 21 mg/dL (ref 8–23)
CO2: 26 mmol/L (ref 22–32)
Calcium: 8.9 mg/dL (ref 8.9–10.3)
Chloride: 96 mmol/L — ABNORMAL LOW (ref 98–111)
Creatinine, Ser: 1.46 mg/dL — ABNORMAL HIGH (ref 0.44–1.00)
GFR, Estimated: 36 mL/min — ABNORMAL LOW (ref >60)
Glucose, Bld: 110 mg/dL — ABNORMAL HIGH (ref 70–99)
Potassium: 3.7 mmol/L (ref 3.5–5.1)
Sodium: 133 mmol/L — ABNORMAL LOW (ref 135–145)
Total Bilirubin: 0.5 mg/dL (ref 0.3–1.2)
Total Protein: 6.7 g/dL (ref 6.5–8.1)

## 2021-05-12 LAB — RESP PANEL BY RT-PCR (FLU A&B, COVID) ARPGX2
Influenza A by PCR: NEGATIVE
Influenza B by PCR: NEGATIVE
SARS Coronavirus 2 by RT PCR: POSITIVE — AB

## 2021-05-12 MED ORDER — MOLNUPIRAVIR EUA 200MG CAPSULE
4.0000 | ORAL_CAPSULE | Freq: Two times a day (BID) | ORAL | Status: DC
  Administered 2021-05-13 – 2021-05-14 (×4): 800 mg via ORAL
  Filled 2021-05-12: qty 4

## 2021-05-12 MED ORDER — MOMETASONE FURO-FORMOTEROL FUM 200-5 MCG/ACT IN AERO
2.0000 | INHALATION_SPRAY | Freq: Two times a day (BID) | RESPIRATORY_TRACT | Status: DC
  Administered 2021-05-12 – 2021-05-14 (×4): 2 via RESPIRATORY_TRACT
  Filled 2021-05-12: qty 8.8

## 2021-05-12 MED ORDER — ALBUTEROL SULFATE HFA 108 (90 BASE) MCG/ACT IN AERS
2.0000 | INHALATION_SPRAY | Freq: Four times a day (QID) | RESPIRATORY_TRACT | Status: DC | PRN
  Administered 2021-05-13: 2 via RESPIRATORY_TRACT
  Filled 2021-05-12: qty 6.7

## 2021-05-12 MED ORDER — ACETAMINOPHEN 325 MG PO TABS
650.0000 mg | ORAL_TABLET | Freq: Four times a day (QID) | ORAL | Status: DC | PRN
  Administered 2021-05-13 – 2021-05-14 (×3): 650 mg via ORAL
  Filled 2021-05-12 (×3): qty 2

## 2021-05-12 MED ORDER — INSULIN ASPART 100 UNIT/ML IJ SOLN
0.0000 [IU] | Freq: Three times a day (TID) | INTRAMUSCULAR | Status: DC
  Administered 2021-05-13 – 2021-05-14 (×2): 1 [IU] via SUBCUTANEOUS
  Administered 2021-05-14: 2 [IU] via SUBCUTANEOUS

## 2021-05-12 MED ORDER — HYDROCOD POLI-CHLORPHE POLI ER 10-8 MG/5ML PO SUER: 5.0000 mL | Freq: Two times a day (BID) | ORAL | Status: DC | PRN

## 2021-05-12 MED ORDER — ONDANSETRON HCL 4 MG/2ML IJ SOLN: 4.0000 mg | Freq: Four times a day (QID) | INTRAMUSCULAR | Status: DC | PRN

## 2021-05-12 MED ORDER — PRAVASTATIN SODIUM 10 MG PO TABS
10.0000 mg | ORAL_TABLET | Freq: Every day | ORAL | Status: DC
  Administered 2021-05-13 (×2): 10 mg via ORAL
  Filled 2021-05-12 (×2): qty 1

## 2021-05-12 MED ORDER — GUAIFENESIN-DM 100-10 MG/5ML PO SYRP
10.0000 mL | ORAL_SOLUTION | ORAL | Status: DC | PRN
  Administered 2021-05-13: 10 mL via ORAL
  Filled 2021-05-12: qty 10

## 2021-05-12 MED ORDER — ONDANSETRON HCL 4 MG PO TABS: 4.0000 mg | ORAL_TABLET | Freq: Four times a day (QID) | ORAL | Status: DC | PRN

## 2021-05-12 MED ORDER — ENOXAPARIN SODIUM 60 MG/0.6ML IJ SOSY
60.0000 mg | PREFILLED_SYRINGE | INTRAMUSCULAR | Status: DC
Start: 2021-05-12 — End: 2021-05-14
  Administered 2021-05-12 – 2021-05-13 (×2): 60 mg via SUBCUTANEOUS
  Filled 2021-05-12 (×2): qty 0.6

## 2021-05-12 MED ORDER — SODIUM CHLORIDE 0.9 % IV SOLN: INTRAVENOUS | Status: AC

## 2021-05-12 NOTE — Assessment & Plan Note (Signed)
Secondary to COVID-19 viral infection.  Normally ambulates on her own, has not been unable to do so last 2 days resulting in nontraumatic falls. -Request PT/OT eval

## 2021-05-12 NOTE — ED Triage Notes (Signed)
Pt states that she had a fall yesterday and today- stated generalized weakness and "legs giving out". Pt states she has new urinary incontinence that just started as well.   Denies pain

## 2021-05-12 NOTE — ED Provider Notes (Addendum)
[facility] EMERGENCY DEPARTMENT Provider Note   CSN: [id number] Arrival date & time: 05/12/21  1555     History  Chief Complaint  Patient presents with   Weakness   Fall    Elizabeth [name] is a 81 y.o. female.  Patient is a 81 year old female who presents with generalized weakness.  She says that her legs have been giving out.  She said this happened about 2 days ago.  She is also having urinary frequency.  She says that she is going "all the time".  At times she is not able to make it to the bathroom and has some incontinence.  She does have a sense that she has to go, she just cannot make it to the bathroom in time.  No burning on urination.  No known fevers.  No nausea or vomiting.  She does not report any cough or cold symptoms.  No chest pain or shortness of breath.  She does not report any injuries from her recent falls.  She said she had a fall yesterday when she was trying to get up.  She does not know quite what made her fall.      Home Medications Prior to Admission medications   Medication Sig Start Date End Date Taking? Authorizing Provider  albuterol (PROVENTIL HFA;VENTOLIN HFA) 108 (90 BASE) MCG/ACT inhaler Inhale 2 puffs into the lungs every 6 (six) hours as needed. For bronchitis   Yes [provider]  albuterol (PROVENTIL) (2.5 MG/3ML) 0.083% nebulizer solution Take 2.5 mg by nebulization every 6 (six) hours as needed. For bronchitis   Yes [provider]  budesonide-formoterol (SYMBICORT) 160-4.5 MCG/ACT inhaler Inhale 2 puffs into the lungs 2 (two) times daily.   Yes [provider]  Cetirizine HCl (ZYRTEC PO) Take by mouth daily.   Yes [provider]  diclofenac Sodium (VOLTAREN) 1 % GEL Apply 2 g topically 4 (four) times daily. 04/24/21  Yes [provider]  fluticasone (FLONASE) 50 MCG/ACT nasal spray Place 2 sprays into the nose daily.   Yes [provider]  hydrochlorothiazide (HYDRODIURIL)  25 MG tablet Take 25 mg by mouth daily.   Yes [provider]  metFORMIN (GLUCOPHAGE) 500 MG tablet Take 500 mg by mouth 2 (two) times daily with a meal.   Yes [provider]  Multiple Vitamins-Minerals (CENTRUM SILVER PO) Take 1 tablet by mouth.   Yes [provider]  olmesartan (BENICAR) 40 MG tablet Take 40 mg by mouth daily. 03/30/21  Yes [provider]  oxybutynin (DITROPAN) 5 MG tablet Take 5 mg by mouth 2 (two) times daily. 06/17/16  Yes [provider]  potassium chloride (KLOR-CON) 10 MEQ tablet Take 10 mEq by mouth daily. 02/28/21  Yes [provider]  pravastatin (PRAVACHOL) 10 MG tablet Take 10 mg by mouth at bedtime. 03/21/21  Yes [provider]  cyclobenzaprine (FLEXERIL) 10 MG tablet Take 10 mg by mouth as needed for muscle spasms. Patient not taking: Reported on 05/12/2021    [provider]  pantoprazole (PROTONIX) 40 MG tablet Take 40 mg by mouth daily. Patient not taking: Reported on 05/12/2021    [provider]      Allergies    Patient has no known allergies.    Review of Systems   Review of Systems  Constitutional:  Positive for fatigue. Negative for chills, diaphoresis and fever.  HENT:  Negative for congestion, rhinorrhea and sneezing.   Eyes: Negative.   Respiratory:  Negative for cough, chest tightness and shortness of breath.   Cardiovascular:  Negative for chest pain and leg swelling.  Gastrointestinal:  Negative for abdominal pain, blood in stool, diarrhea, nausea and vomiting.  Genitourinary:  Positive for frequency and urgency. Negative for difficulty urinating, flank pain and hematuria.  Musculoskeletal:  Negative for arthralgias and back pain.  Skin:  Negative for rash.  Neurological:  Negative for dizziness, speech difficulty, weakness (Generalized), numbness and headaches.   Physical Exam Updated Vital Signs BP (!) 110/57    Pulse 73    Temp 99.2 F (37.3 C) (Oral)     Resp 20    Ht 5\' 4"  (1.626 m)    Wt 127.5 kg    SpO2 93%    BMI 48.23 kg/m  Physical Exam Constitutional:      Appearance: She is well-developed. She is obese.  HENT:     Head: Normocephalic and atraumatic.  Eyes:     Pupils: Pupils are equal, round, and reactive to light.  Cardiovascular:     Rate and Rhythm: Normal rate and regular rhythm.     Heart sounds: Normal heart sounds.  Pulmonary:     Effort: Pulmonary effort is normal. No respiratory distress.     Breath sounds: Normal breath sounds. No wheezing or rales.  Chest:     Chest wall: No tenderness.  Abdominal:     General: Bowel sounds are normal.     Palpations: Abdomen is soft.     Tenderness: There is no abdominal tenderness. There is no guarding or rebound.  Musculoskeletal:        General: Normal range of motion.     Cervical back: Normal range of motion and neck supple.     Right lower leg: Edema present.     Left lower leg: Edema present.     Comments: Edema to the lower extremities bilaterally which is chronic.  Lymphadenopathy:     Cervical: No cervical adenopathy.  Skin:    General: Skin is warm and dry.     Findings: No rash.  Neurological:     General: No focal deficit present.     Mental Status: She is alert and oriented to person, place, and time.     Comments: Motor 5 out of 5 all extremities, sensation grossly intact to light touch all extremities, no pronator drift.  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    ED Results / Procedures / Treatments   Labs (all labs ordered are listed, but only abnormal results are displayed) Labs Reviewed  RESP PANEL BY RT-PCR (FLU A&B, COVID) ARPGX2 - Abnormal; Notable for the following components:      Result Value   SARS Coronavirus 2 by RT PCR POSITIVE (*)    All other components within normal limits  COMPREHENSIVE METABOLIC PANEL - Abnormal; Notable for the following  components:   Sodium 133 (*)    Chloride 96 (*)    Glucose, Bld 110 (*)    Creatinine, Ser 1.46 (*)    GFR, Estimated 36 (*)    All other components within normal limits  CBC WITH DIFFERENTIAL/PLATELET - Abnormal; Notable for the following components:   WBC 2.5 (*)    Neutro Abs 1.2 (*)    All other components within normal limits  URINALYSIS, ROUTINE W REFLEX MICROSCOPIC - Abnormal; Notable for the following components:   Hgb urine dipstick SMALL (*)  Bacteria, UA RARE (*)    All other components within normal limits  CBC WITH DIFFERENTIAL/PLATELET  BASIC METABOLIC PANEL    EKG EKG Interpretation  Date/Time:  Saturday May 12 2021 16:07:07 EST Ventricular Rate:  99 PR Interval:  155 QRS Duration: 137 QT Interval:  353 QTC Calculation: 453 R Axis:   -88 Text Interpretation: Unknown rhythm, 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 EXAM: PORTABLE CHEST 1 VIEW COMPARISON:  Chest radiograph dated 05/08/2021. FINDINGS: No focal consolidation, pleural effusion, pneumothorax. The cardiac silhouette is within normal limits. No acute osseous pathology. Degenerative changes of the spine and shoulders. Surgical clips over the lower neck. IMPRESSION: No active disease. Electronically Signed   By: Anner Crete M.D.   On: 05/12/2021 21:18    Procedures Procedures    Medications Ordered in ED Medications  0.9 %  sodium chloride infusion (has no administration in time range)  molnupiravir EUA (LAGEVRIO) capsule 800 mg (has no administration in time range)  enoxaparin (LOVENOX) injection 60 mg (has no administration in time range)  guaiFENesin-dextromethorphan (ROBITUSSIN DM) 100-10 MG/5ML syrup 10 mL (has no administration in time range)   chlorpheniramine-HYDROcodone 10-8 MG/5ML suspension 5 mL (has no administration in time range)  ondansetron (ZOFRAN) tablet 4 mg (has no administration in time range)    Or  ondansetron (ZOFRAN) injection 4 mg (has no administration in time range)  acetaminophen (TYLENOL) tablet 650 mg (has no administration in time range)  insulin aspart (novoLOG) injection 0-9 Units (has no administration in time range)  mometasone-formoterol (DULERA) 200-5 MCG/ACT inhaler 2 puff (has no administration in time range)  albuterol (VENTOLIN HFA) 108 (90 Base) MCG/ACT inhaler 2 puff (has no administration in time range)  pravastatin (PRAVACHOL) tablet 10 mg (has no administration in time range)    ED Course/ Medical Decision Making/ A&P                           Medical Decision Making Amount and/or Complexity of Data Reviewed Independent Historian:     Details: Daughter External Data Reviewed: labs and notes. Labs: ordered. Radiology: ordered and independent interpretation performed. Decision-making details documented in ED Course. ECG/medicine tests: ordered and independent interpretation performed. Decision-making details documented in ED Course.  Risk Decision regarding hospitalization.   Patient presents with generalized weakness and urinary frequency.  Her urine does not appear to be consistent with infection.  Her other labs are nonconcerning other than her WBC count is low which could be consistent with her viral infection.  She also has a slight increase in her creatinine.  She had a chest x-ray which showed no acute abnormalities.  This was interpreted by me as well.  Her vital signs are stable.  Her COVID test is positive which likely is etiology for her generalized fatigue.  She does not have any focal neurologic deficits would be more concerning for stroke.  She does not have symptoms that sound more concerning for ACS.  She does not have any significant back pain or radicular symptoms that  would be more concerning for spinal etiology.  Given her generalized weakness and recent falls, family is concerned about her going home.  I feel that inpatient treatment would be reasonable in this regard.  I spoke  with Dr. Posey Pronto who has agreed to admit the patient for further treatment.  Final Clinical Impression(s) / ED Diagnoses Final diagnoses:  COVID-19 virus infection    Rx / DC Orders ED Discharge Orders     None         Malvin Johns, MD 05/12/21 [id number]    Malvin Johns, MD 05/12/21 2316

## 2021-05-12 NOTE — Assessment & Plan Note (Signed)
Mild leukopenia with ANC 1200 on admission.  Suspect related to acute infection.  Continue to monitor.

## 2021-05-12 NOTE — Assessment & Plan Note (Signed)
SARS-CoV-2 PCR positive 05/12/2021 resulting in generalized weakness and dehydration.  She has new nonproductive cough but saturating well on room air.  Denies significant dyspnea.  She states that she has received prior COVID-19 vaccinations. -Start on Molnupiravir -Obtain CXR -Use supplemental oxygen if needed

## 2021-05-12 NOTE — Assessment & Plan Note (Addendum)
Hold olmesartan and HCTZ given AKI.

## 2021-05-12 NOTE — Assessment & Plan Note (Signed)
Hold metformin and place on SSI. 

## 2021-05-12 NOTE — Assessment & Plan Note (Signed)
Stable without wheezing on admission.  Continue Symbicort and albuterol as needed.

## 2021-05-12 NOTE — H&P (Addendum)
History and Physical    CONCEPCION MCGURRIN [id number] DOB: 1941/02/19 DOA: 05/12/2021  PCP: Buzzy Han, MD  Patient coming from: Home  I have personally briefly reviewed patient's old medical records in [place]  Chief Complaint: Generalized weakness  HPI: MARELY NATALI is a 81 y.o. 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  Patient reports 3 days of generalized weakness.  She normally ambulates on her own at home however has had difficulty doing such the last few days.  She has had 2 falls at home once yesterday and once today.  These occurred after she felt her legs give out on her.  She states that she did not suffer any injury or hit her head.  She did not lose consciousness.  She has had a new cough for the last 2 days which has been largely nonproductive.  She denies any shortness of breath.  She denies any subjective fevers, chills, diaphoresis, nausea vomiting, abdominal pain.  She reports increased urinary frequency without dysuria.  She reports poor oral intake recently.  She reports chronic swelling to her lower extremities, worse when sitting for long periods of time.  She also reports pins and needle sensation in her feet again when sitting long peers at times, improves after standing up for a while.  ED Course:  Initial vitals showed BP 166/87, pulse 84, RR 18, temp 99.2 F, SPO2 96% on room air.  Labs show sodium 133, potassium 3.7, bicarb 26, BUN 21, creatinine 1.46, serum glucose 110, LFTs within normal limits, WBC 2.5, hemoglobin 12.8, platelets 155,000.  Urinalysis negative for UTI.  SARS-CoV-2 PCR is positive.  Influenza negative.  The hospitalist service was consulted to admit for further evaluation and management.  Review of Systems: All systems reviewed and are negative  except as documented in history of present illness above.   Past Medical History:  Diagnosis Date   Arthritis    Asthma    Breast mass in female    Bronchitis    Bronchitis    Constipation    Cough    GERD (gastroesophageal reflux disease)    Hypertension associated with diabetes ([place])    Type 2 diabetes mellitus ([place])    Wheezing     Past Surgical History:  Procedure Laterality Date   ABDOMINAL HYSTERECTOMY  1996   CYSTECTOMY  1980's   back of neck   THYROID SURGERY  2001    Social History:  reports that she quit smoking about 57 years ago. Her smoking use included cigarettes. She has never used smokeless tobacco. She reports that she does not drink alcohol and does not use drugs.  No Known Allergies  Family History  Problem Relation Age of Onset   Diabetes Mother    Heart disease Mother    Cancer Maternal Uncle      Prior to Admission medications   Medication Sig Start Date End Date Taking? Authorizing Provider  albuterol (PROVENTIL HFA;VENTOLIN HFA) 108 (90 BASE) MCG/ACT inhaler Inhale 2 puffs into the lungs every 6 (six) hours as needed. For bronchitis    [provider]  albuterol (PROVENTIL) (2.5 MG/3ML) 0.083% nebulizer solution Take 2.5 mg by nebulization every 6 (six) hours as needed. For bronchitis    [provider]  budesonide-formoterol (SYMBICORT) 160-4.5 MCG/ACT inhaler Inhale 2 puffs into the lungs 2 (two) times daily.  [provider]  Cetirizine HCl (ZYRTEC PO) Take by mouth daily.    [provider]  cholecalciferol (VITAMIN D) 1000 UNITS tablet Take 1,000 Units by mouth daily.    [provider]  cyclobenzaprine (FLEXERIL) 10 MG tablet Take 10 mg by mouth as needed for muscle spasms.    [provider]  econazole nitrate 1 % cream Apply topically daily. Apply to skin on feet daily x 30 days 02/08/13   Gean Birchwood, DPM  fluticasone (FLONASE) 50 MCG/ACT nasal spray Place 2 sprays into the  nose as needed.    [provider]  gabapentin (NEURONTIN) 100 MG capsule  07/24/16   [provider]  hydrochlorothiazide (HYDRODIURIL) 25 MG tablet Take 25 mg by mouth daily.    [provider]  lisinopril (PRINIVIL,ZESTRIL) 40 MG tablet Take 20 mg by mouth daily.    [provider]  losartan (COZAAR) 25 MG tablet  07/08/16   [provider]  metFORMIN (GLUCOPHAGE) 500 MG tablet Take 500 mg by mouth 2 (two) times daily with a meal.    [provider]  Multiple Vitamins-Minerals (CENTRUM SILVER PO) Take 1 tablet by mouth.    [provider]  oxybutynin (DITROPAN) 5 MG tablet  06/17/16   [provider]  pantoprazole (PROTONIX) 40 MG tablet Take 40 mg by mouth daily.    [provider]    Physical Exam: Vitals:   05/12/21 1730 05/12/21 1800 05/12/21 1830 05/12/21 1900  BP: (!) 170/90 (!) 160/82 125/64 (!) 141/85  Pulse: 75 76 72 69  Resp: (!) 25 (!) 22 19 19   Temp:      TempSrc:      SpO2: 94% 96% 94% 94%  Weight:      Height:       Constitutional: Obese woman resting in bed with head elevated, NAD, calm, comfortable Eyes: PERRL, lids and conjunctivae normal ENMT: Mucous membranes are moist. Posterior pharynx clear of any exudate or lesions.Normal dentition.  Neck: normal, supple, no masses. Respiratory: clear to auscultation bilaterally, no wheezing, no crackles.  Occasional cough.  Normal respiratory effort. No accessory muscle use.  Cardiovascular: Regular rate and rhythm, no murmurs / rubs / gallops.  +1 bilateral lower extremity edema. 2+ pedal pulses. Abdomen: no tenderness, no masses palpated. No hepatosplenomegaly. Bowel sounds positive.  Musculoskeletal: no clubbing / cyanosis. No joint deformity upper and lower extremities. Good ROM, no contractures. Normal muscle tone.  Skin: no rashes, lesions, ulcers. No induration Neurologic: CN 2-12 grossly intact. Sensation intact. Strength 5/5 in all 4 while  in bed.  Psychiatric: Normal judgment and insight. Alert and oriented x 3. Normal mood.   Labs on Admission: I have personally reviewed following labs and imaging studies  CBC: Recent Labs  Lab 05/08/21 2203 05/12/21 1644  WBC 6.2 2.5*  NEUTROABS  --  1.2*  HGB 13.0 12.8  HCT 40.2 40.2  MCV 96.2 95.3  PLT 201 [id number]   Basic Metabolic Panel: Recent Labs  Lab 05/08/21 2203 05/12/21 1644  NA 137 133*  K 3.9 3.7  CL 101 96*  CO2 26 26  GLUCOSE 107* 110*  BUN 26* 21  CREATININE 1.08* 1.46*  CALCIUM 9.6 8.9  MG 1.9  --    GFR: Estimated Creatinine Clearance: 40 mL/min (A) (by C-G formula based on SCr of 1.46 mg/dL (H)). Liver Function Tests: Recent Labs  Lab 05/12/21 1644  AST 27  ALT 20  ALKPHOS 55  BILITOT 0.5  PROT 6.7  ALBUMIN 3.5   No results for input(s): LIPASE, AMYLASE in the last 168 hours. No results for input(s): AMMONIA in the last 168 hours. Coagulation Profile: No results for input(s): INR, PROTIME in the last 168 hours. Cardiac Enzymes: No results for input(s): CKTOTAL, CKMB, CKMBINDEX, TROPONINI in the last 168 hours. BNP (last 3 results) No results for input(s): PROBNP in the last 8760 hours. HbA1C: No results for input(s): HGBA1C in the last 72 hours. CBG: No results for input(s): GLUCAP in the last 168 hours. Lipid Profile: No results for input(s): CHOL, HDL, LDLCALC, TRIG, CHOLHDL, LDLDIRECT in the last 72 hours. Thyroid Function Tests: No results for input(s): TSH, T4TOTAL, FREET4, T3FREE, THYROIDAB in the last 72 hours. Anemia Panel: No results for input(s): VITAMINB12, FOLATE, FERRITIN, TIBC, IRON, RETICCTPCT in the last 72 hours. Urine analysis:    Component Value Date/Time   COLORURINE YELLOW 05/12/2021 [place] 05/12/2021 1644   LABSPEC 1.016 05/12/2021 1644   PHURINE 5.0 05/12/2021 1644   GLUCOSEU NEGATIVE 05/12/2021 1644   HGBUR SMALL (A) 05/12/2021 1644   BILIRUBINUR NEGATIVE 05/12/2021 1644   KETONESUR  NEGATIVE 05/12/2021 1644   PROTEINUR NEGATIVE 05/12/2021 1644   UROBILINOGEN 1.0 10/05/2009 1218   NITRITE NEGATIVE 05/12/2021 1644   LEUKOCYTESUR NEGATIVE 05/12/2021 1644    Radiological Exams on Admission: DG CHEST PORT 1 VIEW  Result Date: 05/12/2021 CLINICAL DATA:  Acute on chronic kidney disease. EXAM: PORTABLE CHEST 1 VIEW COMPARISON:  Chest radiograph dated 05/08/2021. FINDINGS: No focal consolidation, pleural effusion, pneumothorax. The cardiac silhouette is within normal limits. No acute osseous pathology. Degenerative changes of the spine and shoulders. Surgical clips over the lower neck. IMPRESSION: No active disease. Electronically Signed   By: Anner Crete M.D.   On: 05/12/2021 21:18    EKG: Personally reviewed. Sinus arrhythmia, rate 99, RBBB and LAFB, no prior for comparison.  Assessment/Plan Principal Problem:   Acute kidney injury 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 ([place]) Active Problems:   COVID-19 virus infection   Generalized weakness   Asthma   Leukopenia   Type 2 diabetes mellitus ([place])   Hypertension associated with diabetes ([place])   ANNILEE AZUARA is a 81 y.o. 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  * Acute kidney injury 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 ([place])- (present on admission) AKI on CKD stage IIIa.  Creatinine 1.46 on admission, baseline 1.0-1.1.  Likely prerenal from dehydration/poor oral intake in setting of COVID-19 viral infection plus medication effect. -Started on maintenance IV fluid hydration overnight -Hold HCTZ, olmesartan, metformin -Repeat labs in a.m.  COVID-19 virus infection- (present on admission) SARS-CoV-2 PCR positive 05/12/2021 resulting in generalized weakness and dehydration.  She has new nonproductive cough but saturating  well on room air.  Denies significant dyspnea.  She states that she has received prior COVID-19 vaccinations. -Start on Molnupiravir -Obtain CXR -Use supplemental oxygen if needed  Leukopenia- (present on admission) Mild leukopenia with ANC 1200 on admission.  Suspect related to acute infection.  Continue to monitor.  Asthma- (present on admission) Stable without wheezing on admission.  Continue Symbicort and albuterol as needed.  Generalized weakness Secondary to COVID-19 viral infection.  Normally ambulates on her own, has not been unable to do so last 2 days resulting in nontraumatic falls. -Request PT/OT eval  Hypertension associated  with diabetes ([place])- (present on admission) Hold olmesartan and HCTZ given AKI.  Type 2 diabetes mellitus ([facility]) Hold metformin and place on SSI.  DVT prophylaxis: Lovenox Code Status: Full code, confirmed with patient on admission Family Communication: Discussed with patient's daughter at bedside Disposition Plan: From home, dispo pending PT/OT eval Consults called: None Level of care: Med-Surg Admission status:  Status is: Observation  The patient remains OBS appropriate and will d/c before 2 midnights.  Zada Finders MD [facility]  If 7PM-7AM, please contact night-coverage [email or web address]  05/12/2021, 9:43 PM

## 2021-05-12 NOTE — Assessment & Plan Note (Signed)
AKI on CKD stage IIIa.  Creatinine 1.46 on admission, baseline 1.0-1.1.  Likely prerenal from dehydration/poor oral intake in setting of COVID-19 viral infection plus medication effect. -Started on maintenance IV fluid hydration overnight -Hold HCTZ, olmesartan, metformin -Repeat labs in a.m.

## 2021-05-13 DIAGNOSIS — U071 COVID-19: Secondary | ICD-10-CM | POA: Diagnosis not present

## 2021-05-13 DIAGNOSIS — I129 Hypertensive chronic kidney disease with stage 1 through stage 4 chronic kidney disease, or unspecified chronic kidney disease: Secondary | ICD-10-CM | POA: Diagnosis not present

## 2021-05-13 DIAGNOSIS — N179 Acute kidney failure, unspecified: Secondary | ICD-10-CM | POA: Diagnosis not present

## 2021-05-13 DIAGNOSIS — E1122 Type 2 diabetes mellitus with diabetic chronic kidney disease: Secondary | ICD-10-CM | POA: Diagnosis not present

## 2021-05-13 DIAGNOSIS — N189 Chronic kidney disease, unspecified: Secondary | ICD-10-CM | POA: Diagnosis not present

## 2021-05-13 LAB — CBC WITH DIFFERENTIAL/PLATELET
Abs Immature Granulocytes: 0 10*3/uL (ref 0.00–0.07)
Basophils Absolute: 0 10*3/uL (ref 0.0–0.1)
Basophils Relative: 0 %
Eosinophils Absolute: 0 10*3/uL (ref 0.0–0.5)
Eosinophils Relative: 0 %
HCT: 36.5 % (ref 36.0–46.0)
Hemoglobin: 11.6 g/dL — ABNORMAL LOW (ref 12.0–15.0)
Lymphocytes Relative: 44 %
Lymphs Abs: 0.9 10*3/uL (ref 0.7–4.0)
MCH: 30.2 pg (ref 26.0–34.0)
MCHC: 31.8 g/dL (ref 30.0–36.0)
MCV: 95.1 fL (ref 80.0–100.0)
Monocytes Absolute: 0.1 10*3/uL (ref 0.1–1.0)
Monocytes Relative: 7 %
Neutro Abs: 1 10*3/uL — ABNORMAL LOW (ref 1.7–7.7)
Neutrophils Relative %: 49 %
Platelets: 156 10*3/uL (ref 150–400)
RBC: 3.84 MIL/uL — ABNORMAL LOW (ref 3.87–5.11)
RDW: 13.4 % (ref 11.5–15.5)
WBC: 2 10*3/uL — ABNORMAL LOW (ref 4.0–10.5)
nRBC: 0 % (ref 0.0–0.2)
nRBC: 0 /100 WBC

## 2021-05-13 LAB — BASIC METABOLIC PANEL
Anion gap: 10 (ref 5–15)
BUN: 23 mg/dL (ref 8–23)
CO2: 25 mmol/L (ref 22–32)
Calcium: 8.3 mg/dL — ABNORMAL LOW (ref 8.9–10.3)
Chloride: 97 mmol/L — ABNORMAL LOW (ref 98–111)
Creatinine, Ser: 1.26 mg/dL — ABNORMAL HIGH (ref 0.44–1.00)
GFR, Estimated: 43 mL/min — ABNORMAL LOW (ref >60)
Glucose, Bld: 109 mg/dL — ABNORMAL HIGH (ref 70–99)
Potassium: 3.8 mmol/L (ref 3.5–5.1)
Sodium: 132 mmol/L — ABNORMAL LOW (ref 135–145)

## 2021-05-13 LAB — GLUCOSE, CAPILLARY
Glucose-Capillary: 122 mg/dL — ABNORMAL HIGH (ref 70–99)
Glucose-Capillary: 97 mg/dL (ref 70–99)
Glucose-Capillary: 110 mg/dL — ABNORMAL HIGH (ref 70–99)
Glucose-Capillary: 136 mg/dL — ABNORMAL HIGH (ref 70–99)
Glucose-Capillary: 139 mg/dL — ABNORMAL HIGH (ref 70–99)

## 2021-05-13 MED ORDER — HYDRALAZINE HCL 20 MG/ML IJ SOLN
5.0000 mg | INTRAMUSCULAR | Status: DC | PRN
  Administered 2021-05-13 – 2021-05-14 (×2): 5 mg via INTRAVENOUS
  Filled 2021-05-13 (×2): qty 1

## 2021-05-13 NOTE — Evaluation (Signed)
Physical Therapy Evaluation Patient Details Name: Elizabeth [name] MRN: [id number] DOB: 1940-10-15 Today's Date: 05/13/2021  History of Present Illness  Pt is a 81 y.o. female admitted with AKI and generalized weakness x 3 days with 2 recent falls.  W/u revealed covid +; PMH significant for T2DM, HTN, CKD stage IIIa, asthma, GERD, chronic lower extremity swelling due to venous insufficiency.  Clinical Impression  Patient presents with mild dependencies in gait and mobility, due to generalized weakness, decreased endurance and decreased balance.  Feel patient will make steady gains to return to baseline with PT in 1-2 days.         Recommendations for follow up therapy are one component of a multi-disciplinary discharge planning process, led by the attending physician.  Recommendations may be updated based on patient status, additional functional criteria and insurance authorization.  Follow Up Recommendations Home health PT    Assistance Recommended at Discharge Frequent or constant Supervision/Assistance (for 48 hours and then intermittent supervision)  Patient can return home with the following  A little help with walking and/or transfers    Equipment Recommendations None recommended by PT  Recommendations for Other Services       Functional Status Assessment Patient has had a recent decline in their functional status and demonstrates the ability to make significant improvements in function in a reasonable and predictable amount of time.     Precautions / Restrictions Precautions Precautions: Fall Restrictions Weight Bearing Restrictions: No      Mobility  Bed Mobility Overal bed mobility: Modified Independent             General bed mobility comments: HOB elevated and used railing    Transfers Overall transfer level: Needs assistance Equipment used: Rolling walker (2 wheels) Transfers: Sit to/from Stand Sit to Stand: Min guard                 Ambulation/Gait Ambulation/Gait assistance: Min guard Gait Distance (Feet): 20 Feet Assistive device: Rolling walker (2 wheels) Gait Pattern/deviations: WFL(Within Functional Limits) Gait velocity: decreased     General Gait Details: ambulated x 20' with RW and then 20' with +1 Hand held assistance.  Stairs            Wheelchair Mobility    Modified Rankin (Stroke Patients Only)       Balance Overall balance assessment: Needs assistance Sitting-balance support: No upper extremity supported, Feet supported Sitting balance-Leahy Scale: Good     Standing balance support: Single extremity supported Standing balance-Leahy Scale: Fair                               Pertinent Vitals/Pain Pain Assessment Pain Assessment: No/denies pain    Home Living Family/patient expects to be discharged to:: Private residence Living Arrangements: Children Available Help at Discharge: Family Type of Home: House         Home Layout: One level Home Equipment: Conservation officer, nature (2 wheels);Cane - quad      Prior Function Prior Level of Function : Independent/Modified Independent             Mobility Comments: independent for gait and mobility up until a few days ago per patient ADLs Comments: Indep per pt, daughter reports pt needs assist with LB dressing and bathing     Hand Dominance   Dominant Hand: Right    Extremity/Trunk Assessment   Upper Extremity Assessment Upper Extremity Assessment: LUE deficits/detail LUE Deficits / Details:  Pt has an old torn rotator cuff injury and is unable to lift her arm past 75* of shoulder flexion without pain LUE Sensation: WNL LUE Coordination: decreased gross motor    Lower Extremity Assessment Lower Extremity Assessment: Defer to PT evaluation       Communication   Communication: No difficulties  Cognition Arousal/Alertness: Awake/alert Behavior During Therapy: WFL for tasks assessed/performed Overall  Cognitive Status: Within Functional Limits for tasks assessed                                          General Comments General comments (skin integrity, edema, etc.): VSS on RA    Exercises     Assessment/Plan    PT Assessment Patient needs continued PT services  PT Problem List Decreased activity tolerance;Decreased balance;Decreased mobility       PT Treatment Interventions Gait training;Functional mobility training;Therapeutic activities;Balance training    PT Goals (Current goals can be found in the Care Plan section)  Acute Rehab PT Goals Patient Stated Goal: get stronger PT Goal Formulation: With patient Time For Goal Achievement: 05/20/21 Potential to Achieve Goals: Good    Frequency Min 3X/week     Co-evaluation               AM-PAC PT "6 Clicks" Mobility  Outcome Measure Help needed turning from your back to your side while in a flat bed without using bedrails?: A Little Help needed moving from lying on your back to sitting on the side of a flat bed without using bedrails?: A Little Help needed moving to and from a bed to a chair (including a wheelchair)?: A Little Help needed standing up from a chair using your arms (e.g., wheelchair or bedside chair)?: A Little Help needed to walk in hospital room?: A Little Help needed climbing 3-5 steps with a railing? : A Lot 6 Click Score: 17    End of Session Equipment Utilized During Treatment: Gait belt Activity Tolerance: Patient tolerated treatment well Patient left: in bed;with call bell/phone within reach   PT Visit Diagnosis: Unsteadiness on feet (R26.81);Other abnormalities of gait and mobility (R26.89);History of falling (Z91.81)    Time: [id number] PT Time Calculation (min) (ACUTE ONLY): 22 min   Charges:   PT Evaluation $PT Eval Moderate Complexity: 1 Mod          05/13/2021 Margie, PT Acute Rehabilitation Services Pager:  [phone] Office:  [phone]   Shanna Cisco 05/13/2021, 2:32 PM

## 2021-05-13 NOTE — Care Management Obs Status (Signed)
[facility] NOTIFICATION   Patient Details  Name: Elizabeth [name] MRN: [id number] Date of Birth: 1940/05/25   Medicare Observation Status Notification Given:  Yes    Bartholomew Crews, RN 05/13/2021, 3:18 PM

## 2021-05-13 NOTE — Evaluation (Signed)
Occupational Therapy Evaluation Patient Details Name: Elizabeth [name] MRN: [id number] DOB: Feb 07, 1941 Today's Date: 05/13/2021   History of Present Illness Pt is a 81 y.o. female admitted with AKI and generalized weakness x 3 days with 2 recent falls.  W/u revealed covid +; PMH significant for T2DM, HTN, CKD stage IIIa, asthma, GERD, chronic lower extremity swelling due to venous insufficiency.   Clinical Impression   Pt admitted for concerns listed above. PTA pt reported that she was independent with all ADL's and IADL's, using no DME. At this time, pt presents with decreased activity tolerance, balance, and weakness. She is requiring set up for all UB ADL's and mod-max A for assist with LB ADL's. Recommending HHOT at this time, as well as educated family to look into aides for home to assist with Dressing and bathing, for pt safety. OT will continue to follow acutely.      Recommendations for follow up therapy are one component of a multi-disciplinary discharge planning process, led by the attending physician.  Recommendations may be updated based on patient status, additional functional criteria and insurance authorization.   Follow Up Recommendations  Home health OT    Assistance Recommended at Discharge Intermittent Supervision/Assistance  Patient can return home with the following A little help with walking and/or transfers;A lot of help with bathing/dressing/bathroom;Assistance with cooking/housework    Functional Status Assessment  Patient has had a recent decline in their functional status and demonstrates the ability to make significant improvements in function in a reasonable and predictable amount of time.  Equipment Recommendations  None recommended by OT (Pt reports that her home is not big enough for a BSC or [place])    Recommendations for Other Services       Precautions / Restrictions Precautions Precautions: Fall Restrictions Weight Bearing Restrictions: No       Mobility Bed Mobility Overal bed mobility: Needs Assistance Bed Mobility: Supine to Sit, Sit to Supine     Supine to sit: Min assist Sit to supine: Min assist   General bed mobility comments: Min A to pull to sitting and Min A to bring BLE into bed to return to supine    Transfers Overall transfer level: Needs assistance Equipment used: Rolling walker (2 wheels) Transfers: Sit to/from Stand Sit to Stand: Min assist, Min guard           General transfer comment: initially requiring min A multiple stands after, requiring only min g for safety      Balance Overall balance assessment: Needs assistance Sitting-balance support: No upper extremity supported, Feet supported Sitting balance-Leahy Scale: Good     Standing balance support: Single extremity supported, During functional activity Standing balance-Leahy Scale: Fair                             ADL either performed or assessed with clinical judgement   ADL Overall ADL's : Needs assistance/impaired Eating/Feeding: Set up;Sitting   Grooming: Set up;Sitting   Upper Body Bathing: Set up;Sitting   Lower Body Bathing: Moderate assistance;Sitting/lateral leans;Sit to/from stand   Upper Body Dressing : Set up;Sitting   Lower Body Dressing: Maximal assistance;Sitting/lateral leans;Sit to/from stand   Toilet Transfer: Minimal assistance;Ambulation   Toileting- Clothing Manipulation and Hygiene: Moderate assistance;Sitting/lateral lean;Sit to/from stand       Functional mobility during ADLs: Minimal assistance;Rolling walker (2 wheels) General ADL Comments: Decreased activity tolerance, strength and balance, requiring increased assist for LB ADL's and safety  Vision Baseline Vision/History: 1 Wears glasses Ability to See in Adequate Light: 0 Adequate Patient Visual Report: No change from baseline Vision Assessment?: No apparent visual deficits     Perception     Praxis      Pertinent  Vitals/Pain Pain Assessment Pain Assessment: No/denies pain     Hand Dominance Right   Extremity/Trunk Assessment Upper Extremity Assessment Upper Extremity Assessment: LUE deficits/detail LUE Deficits / Details: Pt has an old torn rotator cuff injury and is unable to lift her arm past 75* of shoulder flexion without pain LUE Sensation: WNL LUE Coordination: decreased gross motor   Lower Extremity Assessment Lower Extremity Assessment: Defer to PT evaluation       Communication Communication Communication: No difficulties   Cognition Arousal/Alertness: Awake/alert Behavior During Therapy: WFL for tasks assessed/performed Overall Cognitive Status: Within Functional Limits for tasks assessed                                       General Comments  VSS on RA    Exercises     Shoulder Instructions      Home Living Family/patient expects to be discharged to:: Private residence Living Arrangements: Children Available Help at Discharge: Family Type of Home: House       Home Layout: One level     Bathroom Shower/Tub: Teacher, early years/pre: Standard Bathroom Accessibility: No   Home Equipment: Conservation officer, nature (2 wheels);Cane - quad          Prior Functioning/Environment Prior Level of Function : Independent/Modified Independent             Mobility Comments: independent for gait and mobility up until a few days ago per patient ADLs Comments: Indep per pt, daughter reports pt needs assist with LB dressing and bathing        OT Problem List: Decreased strength;Decreased activity tolerance;Impaired balance (sitting and/or standing);Decreased safety awareness;Decreased knowledge of use of DME or AE;Cardiopulmonary status limiting activity      OT Treatment/Interventions: Self-care/ADL training;Therapeutic exercise;Energy conservation;DME and/or AE instruction;Therapeutic activities;Patient/family education;Balance training    OT  Goals(Current goals can be found in the care plan section) Acute Rehab OT Goals Patient Stated Goal: To go home OT Goal Formulation: With patient Time For Goal Achievement: 05/27/21 Potential to Achieve Goals: Good ADL Goals Pt Will Perform Lower Body Bathing: with modified independence;with adaptive equipment;sitting/lateral leans;sit to/from stand Pt Will Perform Lower Body Dressing: with modified independence;with adaptive equipment;sitting/lateral leans;sit to/from stand Pt Will Transfer to Toilet: with modified independence;ambulating Pt Will Perform Toileting - Clothing Manipulation and hygiene: with modified independence;sitting/lateral leans;sit to/from stand;with adaptive equipment Additional ADL Goal #1: Pt will verbalize 3 energy conservation techniques that she can use at home. Additional ADL Goal #2: Pt will verbalize 3 different fall prevention techniques she can use at home.  OT Frequency: Min 2X/week    Co-evaluation              AM-PAC OT "6 Clicks" Daily Activity     Outcome Measure Help from another person eating meals?: A Little Help from another person taking care of personal grooming?: A Little Help from another person toileting, which includes using toliet, bedpan, or urinal?: A Lot Help from another person bathing (including washing, rinsing, drying)?: A Lot Help from another person to put on and taking off regular upper body clothing?: A Little Help from another person to  put on and taking off regular lower body clothing?: A Lot 6 Click Score: 15   End of Session Equipment Utilized During Treatment: Gait belt;Rolling walker (2 wheels) Nurse Communication: Mobility status  Activity Tolerance: Patient tolerated treatment well Patient left: in bed;with call bell/phone within reach;with family/visitor present  OT Visit Diagnosis: Unsteadiness on feet (R26.81);Other abnormalities of gait and mobility (R26.89);Muscle weakness (generalized) (M62.81)                 Time: [id number] OT Time Calculation (min): 50 min Charges:  OT General Charges $OT Visit: 1 Visit OT Evaluation $OT Eval Moderate Complexity: 1 Mod OT Treatments $Self Care/Home Management : 8-22 mins $Therapeutic Activity: 8-22 mins  Matteson Blue H., OTR/L Acute Rehabilitation  Ziv Welchel Elane Yolanda Bonine 05/13/2021, 2:46 PM

## 2021-05-13 NOTE — TOC Initial Note (Addendum)
Transition of Care [facility]) - Initial/Assessment Note    Patient Details  Name: Elizabeth [name] MRN: [id number] Date of Birth: 01-16-1941  Transition of Care [facility]) CM/SW Contact:    Bess Kinds, RN Phone Number: [phone] 05/13/2021, 3:46 PM  Clinical Narrative:                  Spoke with patient on hospital room phone to discuss post acute transition. Patient lives alone in [place]. Stated that demographics in Epic is for her daughter. Consent provided to discuss planning with her daughter, Elizabeth [name].  Received call back from Elizabeth [name]. Advised of current observation status and discussed recommendations for [facility] PT OT. Elizabeth [name] is in agreement with HH needs. Referral accepted by [facility] for PT, OT. Patient will need HH PT, OT and Face to Face orders.   Patient's home address is [address] [place], [place] [address] which is in [place].   Elizabeth [name] stated that they are in the process of cleaning up patient home, and that patient may need a hospital bed and 3N1. Advised to let TOC know if DME will be needed.   TOC following for transition needs.   Expected Discharge Plan: Home w Home Health Services Barriers to Discharge: Continued Medical Work up   Patient Goals and CMS Choice Patient states their goals for this hospitalization and ongoing recovery are:: return home with family support CMS Medicare.gov Compare Post Acute Care list provided to:: Patient Choice offered to / list presented to : Patient, Adult Children (daughter/POA -- Elizabeth [name])  Expected Discharge Plan and Services Expected Discharge Plan: Home w Home Health Services   Discharge Planning Services: CM Consult Post Acute Care Choice: Home Health Living arrangements for the past 2 months: Single Family Home                           HH Arranged: PT, OT HH Agency: [facility] Date [facility] Agency Contacted: 05/13/21 Time HH Agency Contacted: [phone] Representative spoke with at [facility] Agency: Marylene Land  Prior Living  Arrangements/Services Living arrangements for the past 2 months: Single Family Home Lives with:: Self Patient language and need for interpreter reviewed:: Yes Do you feel safe going back to the place where you live?: Yes      Need for Family Participation in Patient Care: Yes (Comment) Care giver support system in place?: Yes (comment)   Criminal Activity/Legal Involvement Pertinent to Current Situation/Hospitalization: No - Comment as needed  Activities of Daily Living Home Assistive Devices/Equipment: Eyeglasses ADL Screening (condition at time of admission) Patient's cognitive ability adequate to safely complete daily activities?: Yes Is the patient deaf or have difficulty hearing?: No Does the patient have difficulty seeing, even when wearing glasses/contacts?: No Does the patient have difficulty concentrating, remembering, or making decisions?: No Patient able to express need for assistance with ADLs?: Yes Does the patient have difficulty dressing or bathing?: Yes Independently performs ADLs?: No Communication: Independent Dressing (OT): Needs assistance Is this a change from baseline?: Change from baseline, expected to last <3days Grooming: Independent Feeding: Independent Bathing: Needs assistance Is this a change from baseline?: Change from baseline, expected to last <3 days Toileting: Needs assistance Is this a change from baseline?: Change from baseline, expected to last <3 days In/Out Bed: Needs assistance Is this a change from baseline?: Change from baseline, expected to last <3 days Walks in Home: Needs assistance Does the patient have difficulty walking or climbing stairs?: Yes Weakness of  Legs: Both Weakness of Arms/Hands: None  Permission Sought/Granted   Permission granted to share information with : Yes, Verbal Permission Granted  Share Information with NAME: Elizabeth [name]     Permission granted to share info w Relationship: daughter  Permission granted to  share info w Contact Information: [phone]  Emotional Assessment Appearance:: Appears stated age Attitude/Demeanor/Rapport: Engaged Affect (typically observed): Accepting Orientation: : Oriented to Self, Oriented to Place, Oriented to  Time, Oriented to Situation Alcohol / Substance Use: Not Applicable Psych Involvement: No (comment)  Admission diagnosis:  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) [N17.9, N18.9] COVID-19 virus infection [U07.1] Patient Active Problem List   Diagnosis Date Noted   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) 05/12/2021   COVID-19 virus infection 05/12/2021   Generalized weakness 05/12/2021   Type 2 diabetes mellitus (HCC) 05/12/2021   Hypertension associated with diabetes (HCC) 05/12/2021   Asthma 05/12/2021   GERD (gastroesophageal reflux disease)    Leukopenia    Chronic venous insufficiency 01/17/2017   Pain of left breast 01/17/2012   PCP:  Margot Ables, MD Pharmacy:   [facility] [phone] [place], [place] - [address] [place] [address] Phone: [phone] Fax: [phone]  [facility] [place] - [address] [address] [address] [place] [address] Phone: [phone] Fax: [phone]  [id number] - [place], [place] - [address] [place] [address] Phone: [phone] Fax: [phone]     Social Determinants of Health (SDOH) Interventions    Readmission Risk Interventions No flowsheet data found.

## 2021-05-13 NOTE — Progress Notes (Signed)
HPI: Elizabeth [name] is a 81 y.o. 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  Patient reports 3 days of generalized weakness.  She normally ambulates on her own at home however has had difficulty doing such the last few days.  She has had 2 falls at home once yesterday and once today.  These occurred after she felt her legs give out on her.  She states that she did not suffer any injury or hit her head.  She did not lose consciousness.  She has had a new cough for the last 2 days which has been largely nonproductive.  She denies any shortness of breath.  She denies any subjective fevers, chills, diaphoresis, nausea vomiting, abdominal pain.  She reports increased urinary frequency without dysuria.  She reports poor oral intake recently.  She reports chronic swelling to her lower extremities, worse when sitting for long periods of time.  She also reports pins and needle sensation in her feet again when sitting long peers at times, improves after standing up for a while.  ED Course:  Initial vitals showed BP 166/87, pulse 84, RR 18, temp 99.2 F, SPO2 96% on room air.  Labs show sodium 133, potassium 3.7, bicarb 26, BUN 21, creatinine 1.46, serum glucose 110, LFTs within normal limits, WBC 2.5, hemoglobin 12.8, platelets 155,000.  Urinalysis negative for UTI.  SARS-CoV-2 PCR is positive.  Influenza negative.  The hospitalist service was consulted to admit for further evaluation and management.  Subjective: Patient feels okay still some weak  Physical Exam: Vitals:   05/13/21 0232 05/13/21 0255 05/13/21 0700 05/13/21 0856  BP:  (!) 144/106 (!) 142/80 (!) 143/59  Pulse:  78 77 67  Resp:  19 19 18   Temp: 99.4 F (37.4 C) 99.3 F (37.4 C) 98.2 F (36.8 C) 98.8 F (37.1 C)  TempSrc: Oral Oral Oral   SpO2:  97% 98% 96%   Weight:      Height:       Constitutional: Obese woman resting in bed with head elevated, NAD, calm, comfortable Eyes: PERRL, lids and conjunctivae normal ENMT: Mucous membranes are moist. Posterior pharynx clear of any exudate or lesions.Normal dentition.  Neck: normal, supple, no masses. Respiratory: clear to auscultation bilaterally, no wheezing, no crackles.  Occasional cough.  Normal respiratory effort. No accessory muscle use.  Cardiovascular: Regular rate and rhythm, no murmurs / rubs / gallops.  +1 bilateral lower extremity edema. 2+ pedal pulses. Abdomen: no tenderness, no masses palpated. No hepatosplenomegaly. Bowel sounds positive.  Musculoskeletal: no clubbing / cyanosis. No joint deformity upper and lower extremities. Good ROM, no contractures. Normal muscle tone.  Skin: no rashes, lesions, ulcers. No induration Neurologic: CN 2-12 grossly intact. Sensation intact. Strength 5/5 in all 4 while in bed.  Psychiatric: Normal judgment and insight. Alert and oriented x 3. Normal mood.   Labs on Admission: I have personally reviewed following labs and imaging studies  CBC: Recent Labs  Lab 05/08/21 2203 05/12/21 1644 05/13/21 0440  WBC 6.2 2.5* 2.0*  NEUTROABS  --  1.2* 1.0*  HGB 13.0 12.8 11.6*  HCT 40.2 40.2 36.5  MCV 96.2 95.3 95.1  PLT 201 155 [id number]   Basic Metabolic Panel: Recent Labs  Lab 05/08/21 2203 05/12/21 1644 05/13/21 0440  NA 137 133* 132*  K 3.9 3.7 3.8  CL 101 96* 97*  CO2 26 26 25   GLUCOSE 107* 110* 109*  BUN 26* 21 23  CREATININE 1.08* 1.46* 1.26*  CALCIUM 9.6 8.9 8.3*  MG 1.9  --   --    GFR: Estimated Creatinine Clearance: 46.3 mL/min (A) (by C-G formula based on SCr of 1.26 mg/dL (H)). Liver Function Tests: Recent Labs  Lab 05/12/21 1644  AST 27  ALT 20  ALKPHOS 55  BILITOT 0.5  PROT 6.7  ALBUMIN 3.5   No results for input(s): LIPASE, AMYLASE in the last 168 hours. No results for input(s): AMMONIA in the last 168  hours. Coagulation Profile: No results for input(s): INR, PROTIME in the last 168 hours. Cardiac Enzymes: No results for input(s): CKTOTAL, CKMB, CKMBINDEX, TROPONINI in the last 168 hours. BNP (last 3 results) No results for input(s): PROBNP in the last 8760 hours. HbA1C: No results for input(s): HGBA1C in the last 72 hours. CBG: Recent Labs  Lab 05/13/21 0258 05/13/21 0700  GLUCAP 122* 97   Lipid Profile: No results for input(s): CHOL, HDL, LDLCALC, TRIG, CHOLHDL, LDLDIRECT in the last 72 hours. Thyroid Function Tests: No results for input(s): TSH, T4TOTAL, FREET4, T3FREE, THYROIDAB in the last 72 hours. Anemia Panel: No results for input(s): VITAMINB12, FOLATE, FERRITIN, TIBC, IRON, RETICCTPCT in the last 72 hours. Urine analysis:    Component Value Date/Time   COLORURINE YELLOW 05/12/2021 [place] 05/12/2021 1644   LABSPEC 1.016 05/12/2021 1644   PHURINE 5.0 05/12/2021 1644   GLUCOSEU NEGATIVE 05/12/2021 1644   HGBUR SMALL (A) 05/12/2021 1644   BILIRUBINUR NEGATIVE 05/12/2021 1644   KETONESUR NEGATIVE 05/12/2021 1644   PROTEINUR NEGATIVE 05/12/2021 1644   UROBILINOGEN 1.0 10/05/2009 1218   NITRITE NEGATIVE 05/12/2021 1644   LEUKOCYTESUR NEGATIVE 05/12/2021 1644    Radiological Exams on Admission: DG CHEST PORT 1 VIEW  Result Date: 05/12/2021 CLINICAL DATA:  Acute on chronic kidney disease. EXAM: PORTABLE CHEST 1 VIEW COMPARISON:  Chest radiograph dated 05/08/2021. FINDINGS: No focal consolidation, pleural effusion, pneumothorax. The cardiac silhouette is within normal limits. No acute osseous pathology. Degenerative changes of the spine and shoulders. Surgical clips over the lower neck. IMPRESSION: No active disease. Electronically Signed   By: Anner Crete M.D.   On: 05/12/2021 21:18    EKG: Personally reviewed. Sinus arrhythmia, rate 99, RBBB and LAFB, no prior for comparison.  Assessment/Plan Principal Problem:   Acute kidney injury  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 ([place]) Active Problems:   COVID-19 virus infection   Generalized weakness   Type 2 diabetes mellitus ([place])   Hypertension associated with diabetes ([place])   Asthma   Leukopenia   Elizabeth [name] is a 81 y.o. 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  * Acute kidney injury 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 ([place])- (present on admission) AKI on CKD stage IIIa.  Creatinine 1.46 on admission, baseline 1.0-1.1.  Likely prerenal from dehydration/poor oral intake in setting of COVID-19 viral infection plus medication effect. -Started on maintenance IV fluid hydration overnight -Hold HCTZ, olmesartan, metformin -Repeat labs with improving creatinine of 1.3 from 1.5  Leukopenia- (present on admission) Mild leukopenia with ANC 1200 on admission.  Suspect related to acute infection.  Continue to monitor.  Asthma- (present on admission) Stable without wheezing on admission.  Continue Symbicort and albuterol as needed.  Hypertension associated with  diabetes ([place])- (present on admission) Hold olmesartan and HCTZ given AKI.  Type 2 diabetes mellitus ([facility]) Hold metformin and place on SSI.  Generalized weakness Secondary to COVID-19 viral infection.  Normally ambulates on her own, has not been unable to do so last 2 days resulting in nontraumatic falls. -Request PT/OT eval which is pending  COVID-19 virus infection- (present on admission) SARS-CoV-2 PCR positive 05/12/2021 resulting in generalized weakness and dehydration.  She has new nonproductive cough but saturating well on room air.  Denies significant dyspnea.  She states that she has received prior COVID-19 vaccinations. -Start on Molnupiravir -Obtain CXR -Use supplemental oxygen if needed  DVT prophylaxis:  Lovenox Code Status: Full code, confirmed with patient on admission Family Communication: Discussed with patient's daughter at bedside Disposition Plan: From home, dispo pending PT/OT eval Consults called: None Level of care: Med-Surg Admission status:  Status is: Observation  The patient remains OBS appropriate and will d/c before 2 midnights.  Calven Gilkes A MD [facility]  If 7PM-7AM, please contact night-coverage [email or web address]  05/13/2021, 11:16 AM   Patient ID: Elizabeth [name], female   DOB: February 20, 1941, 81 y.o.   MRN: [id number]

## 2021-05-14 DIAGNOSIS — N179 Acute kidney failure, unspecified: Secondary | ICD-10-CM | POA: Diagnosis not present

## 2021-05-14 DIAGNOSIS — N189 Chronic kidney disease, unspecified: Secondary | ICD-10-CM | POA: Diagnosis not present

## 2021-05-14 LAB — CBC
HCT: 35.8 % — ABNORMAL LOW (ref 36.0–46.0)
Hemoglobin: 11.4 g/dL — ABNORMAL LOW (ref 12.0–15.0)
MCH: 29.8 pg (ref 26.0–34.0)
MCHC: 31.8 g/dL (ref 30.0–36.0)
MCV: 93.7 fL (ref 80.0–100.0)
Platelets: 133 10*3/uL — ABNORMAL LOW (ref 150–400)
RBC: 3.82 MIL/uL — ABNORMAL LOW (ref 3.87–5.11)
RDW: 13 % (ref 11.5–15.5)
WBC: 2.1 10*3/uL — ABNORMAL LOW (ref 4.0–10.5)
nRBC: 0 % (ref 0.0–0.2)

## 2021-05-14 LAB — BASIC METABOLIC PANEL
Anion gap: 9 (ref 5–15)
BUN: 19 mg/dL (ref 8–23)
CO2: 26 mmol/L (ref 22–32)
Calcium: 8.5 mg/dL — ABNORMAL LOW (ref 8.9–10.3)
Chloride: 100 mmol/L (ref 98–111)
Creatinine, Ser: 1.17 mg/dL — ABNORMAL HIGH (ref 0.44–1.00)
GFR, Estimated: 47 mL/min — ABNORMAL LOW (ref >60)
Glucose, Bld: 160 mg/dL — ABNORMAL HIGH (ref 70–99)
Potassium: 3.6 mmol/L (ref 3.5–5.1)
Sodium: 135 mmol/L (ref 135–145)

## 2021-05-14 LAB — GLUCOSE, CAPILLARY
Glucose-Capillary: 154 mg/dL — ABNORMAL HIGH (ref 70–99)
Glucose-Capillary: 170 mg/dL — ABNORMAL HIGH (ref 70–99)
Glucose-Capillary: 123 mg/dL — ABNORMAL HIGH (ref 70–99)

## 2021-05-14 LAB — PATHOLOGIST SMEAR REVIEW

## 2021-05-14 MED ORDER — MOLNUPIRAVIR EUA 200MG CAPSULE: 4.0000 | ORAL_CAPSULE | Freq: Two times a day (BID) | ORAL | 0 refills | Status: AC

## 2021-05-14 MED ORDER — ACETAMINOPHEN 325 MG PO TABS: 650.0000 mg | ORAL_TABLET | Freq: Four times a day (QID) | ORAL | 0 refills | Status: DC | PRN

## 2021-05-14 MED ORDER — GUAIFENESIN-DM 100-10 MG/5ML PO SYRP: 10.0000 mL | ORAL_SOLUTION | ORAL | 0 refills | Status: DC | PRN

## 2021-05-14 NOTE — Progress Notes (Signed)
Elizabeth [name] to be discharged Home per MD order. Discussed prescriptions and follow up appointments with the patient. Prescriptions given to patient; medication list explained in detail. Patient verbalized understanding.  Skin clean, dry and intact without evidence of skin break down, no evidence of skin tears noted. IV catheter discontinued intact. Site without signs and symptoms of complications. Dressing and pressure applied. Pt denies pain at the site currently. No complaints noted.  Patient free of lines, drains, and wounds.   An After Visit Summary (AVS) was printed and given to the patient. Patient escorted via wheelchair, and discharged home via private auto.  Arvilla Meres, RN

## 2021-05-14 NOTE — Discharge Summary (Signed)
Physician Discharge Summary  MACI PALOS [id number] DOB: 14-Aug-1940 DOA: 05/12/2021  PCP: Buzzy Han, MD  Admit date: 05/12/2021 Discharge date: 05/14/2021  Admitted From: Home  Disposition:  Home   Recommendations for Outpatient Follow-up:  Follow up with PCP in 1-2 weeks Please obtain BMP/CBC in one week   Home Health: yes.   Discharge Condition: Stable.  CODE STATUS: Full code Diet recommendation: Heart Healthy   Brief/Interim Summary: Elizabeth [name] is a 81 y.o. 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  Patient reports 3 days of generalized weakness.  She normally ambulates on her own at home however has had difficulty doing such the last few days.  She has had 2 falls at home once yesterday and once today.  These occurred after she felt her legs give out on her.  She states that she did not suffer any injury or hit her head.  She did not lose consciousness.  She has had a new cough for the last 2 days which has been largely nonproductive.  She denies any shortness of breath.  She denies any subjective fevers, chills, diaphoresis, nausea vomiting, abdominal pain.  She reports increased urinary frequency without dysuria.  She reports poor oral intake recently.   She reports chronic swelling to her lower extremities, worse when sitting for long periods of time.  She also reports pins and needle sensation in her feet again when sitting long period  at times, improves after standing up for a while.   ED Course:  Initial vitals showed BP 166/87, pulse 84, RR 18, temp 99.2 F, SPO2 96% on room air.   Labs show sodium 133, potassium 3.7, bicarb 26, BUN 21, creatinine 1.46, serum glucose 110, LFTs within normal limits, WBC 2.5, hemoglobin 12.8, platelets 155,000.   Urinalysis negative for UTI.    SARS-CoV-2 PCR is positive.  Influenza negative.   The hospitalist service was consulted to admit for further evaluation and management.  Acute kidney injury 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 ([place])- (present on admission) AKI on CKD stage IIIa.  Creatinine 1.46 on admission, baseline 1.0-1.1.  Likely prerenal from dehydration/poor oral intake in setting of COVID-19 viral infection plus medication effect. -Treated with IV fluids.  -Hold HCTZ, olmesartan, metformin -Repeat labs with improving creatinine of 1.1 from 1.5 Stable for discharge. Tolerating oral intake.   Leukopenia- (present on admission) Mild leukopenia with ANC 1200 on admission.  Suspect related to acute infection.  Continue to monitor.   Asthma- (present on admission) Stable without wheezing on admission.  Continue Symbicort and albuterol as needed.   Hypertension associated with diabetes ([place])- (present on admission) Hold olmesartan and HCTZ given AKI. BP stable.   Type 2 diabetes mellitus (HCC) Resume  metformin at discharge.    Generalized weakness Secondary to COVID-19 viral infection.  Normally ambulates on her own, has not been unable to do so last 2 days resulting in nontraumatic falls. -PT recommend, HH PT>    COVID-19 virus infection- (present on admission) SARS-CoV-2 PCR positive 05/12/2021 resulting in generalized weakness and dehydration. She states that she has received prior COVID-19 vaccinations. -Started  on Molnupiravir, discharge to complete 5 days treatment.  -Chest x ray: no active diseases.  -Use supplemental oxygen if needed -Denies dyspnea.   Discharge Diagnoses:  Principal Problem:   Acute kidney injury 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 ([place])  Active Problems:   COVID-19 virus infection   Generalized weakness   Type 2 diabetes mellitus (HCC)   Hypertension associated with diabetes (HCC)   Asthma   Leukopenia    Discharge Instructions  Discharge Instructions     Diet -  low sodium heart healthy   Complete by: As directed    Discharge wound care:   Complete by: As directed    See above   Increase activity slowly   Complete by: As directed       Allergies as of 05/14/2021   No Known Allergies      Medication List     STOP taking these medications    cyclobenzaprine 10 MG tablet Commonly known as: FLEXERIL   hydrochlorothiazide 25 MG tablet Commonly known as: HYDRODIURIL   olmesartan 40 MG tablet Commonly known as: BENICAR   potassium chloride 10 MEQ tablet Commonly known as: KLOR-CON       TAKE these medications    acetaminophen 325 MG tablet Commonly known as: TYLENOL Take 2 tablets (650 mg total) by mouth every 6 (six) hours as needed for mild pain or headache (fever >/= 101).   albuterol 108 (90 Base) MCG/ACT inhaler Commonly known as: VENTOLIN HFA Inhale 2 puffs into the lungs every 6 (six) hours as needed. For bronchitis   albuterol (2.5 MG/3ML) 0.083% nebulizer solution Commonly known as: PROVENTIL Take 2.5 mg by nebulization every 6 (six) hours as needed. For bronchitis   budesonide-formoterol 160-4.5 MCG/ACT inhaler Commonly known as: SYMBICORT Inhale 2 puffs into the lungs 2 (two) times daily.   CENTRUM SILVER PO Take 1 tablet by mouth.   diclofenac Sodium 1 % Gel Commonly known as: VOLTAREN Apply 2 g topically 4 (four) times daily.   fluticasone 50 MCG/ACT nasal spray Commonly known as: FLONASE Place 2 sprays into the nose daily.   guaiFENesin-dextromethorphan 100-10 MG/5ML syrup Commonly known as: ROBITUSSIN DM Take 10 mLs by mouth every 4 (four) hours as needed for cough.   metFORMIN 500 MG tablet Commonly known as: GLUCOPHAGE Take 500 mg by mouth 2 (two) times daily with a meal.   molnupiravir EUA 200 mg Caps capsule Commonly known as: LAGEVRIO Take 4 capsules (800 mg total) by mouth 2 (two) times daily for 4 days.   oxybutynin 5 MG tablet Commonly known as: DITROPAN Take 5 mg by mouth 2 (two)  times daily.   pantoprazole 40 MG tablet Commonly known as: PROTONIX Take 40 mg by mouth daily.   pravastatin 10 MG tablet Commonly known as: PRAVACHOL Take 10 mg by mouth at bedtime.   ZYRTEC PO Take by mouth daily.               Durable Medical Equipment  (From admission, onward)           Start     Ordered   05/14/21 1005  For home use only DME 4 wheeled rolling walker with seat  Once       Question:  Patient needs a walker to treat with the following condition  Answer:  Gait instability   05/14/21 1005   05/14/21 1001  For home use only DME Hospital bed  Once       Question Answer Comment  Length of Need 12 Months   Patient has (list medical condition): AKI and Generalized Weakness   The above medical condition requires: Patient requires the ability to reposition frequently   Head must be elevated greater than: 30 degrees   Bed  type Semi-electric   Support Surface: Gel Overlay      05/14/21 1005   05/14/21 1000  For home use only DME 3 n 1  Once        05/14/21 1005              Discharge Care Instructions  (From admission, onward)           Start     Ordered   05/14/21 0000  Discharge wound care:       Comments: See above   05/14/21 1041            Follow-up Information     [place], [place] Follow up.   Specialty: Home Health Services Why: agency now goes by [facility] - the office will call to schedule home health physical and occupational visits Contact information: [place] [address] [phone]         Buzzy Han, MD Follow up in 1 week(s).   Specialty: Family Medicine Contact information: [place] [address] [phone]                No Known Allergies  Consultations: None   Procedures/Studies: DG Chest 2 View  Result Date: 05/08/2021 CLINICAL DATA:  Leg swelling.  Evaluate for pulmonary edema. EXAM: CHEST - 2 VIEW  COMPARISON:  None. FINDINGS: The lungs are clear without focal pneumonia, edema, pneumothorax or pleural effusion. Minimal atelectasis noted left base. The cardiopericardial silhouette is within normal limits for size. The visualized bony structures of the thorax show no acute abnormality. IMPRESSION: No active cardiopulmonary disease. Electronically Signed   By: Misty Stanley M.D.   On: 05/08/2021 15:27   DG CHEST PORT 1 VIEW  Result Date: 05/12/2021 CLINICAL DATA:  Acute on chronic kidney disease. EXAM: PORTABLE CHEST 1 VIEW COMPARISON:  Chest radiograph dated 05/08/2021. FINDINGS: No focal consolidation, pleural effusion, pneumothorax. The cardiac silhouette is within normal limits. No acute osseous pathology. Degenerative changes of the spine and shoulders. Surgical clips over the lower neck. IMPRESSION: No active disease. Electronically Signed   By: Anner Crete M.D.   On: 05/12/2021 21:18   MM 3D SCREEN BREAST BILATERAL  Result Date: 05/10/2021 CLINICAL DATA:  Screening. Technologist indicates best positioning/images possible. EXAM: DIGITAL SCREENING BILATERAL MAMMOGRAM WITH TOMOSYNTHESIS AND CAD TECHNIQUE: Bilateral screening digital craniocaudal and mediolateral oblique mammograms were obtained. Bilateral screening digital breast tomosynthesis was performed. The images were evaluated with computer-aided detection. COMPARISON:  Previous exam(s). ACR Breast Density Category b: There are scattered areas of fibroglandular density. FINDINGS: There are no findings suspicious for malignancy. IMPRESSION: No mammographic evidence of malignancy. A result letter of this screening mammogram will be mailed directly to the patient. RECOMMENDATION: Screening mammogram in one year. (Code:[id number]) BI-RADS CATEGORY  1: Negative. Electronically Signed   By: Ileana Roup M.D.   On: 05/10/2021 15:50     Subjective: Denies dyspnea, not significant cough. Still weak  Discharge Exam: Vitals:   05/14/21 0638  05/14/21 0838  BP: (!) 162/60 131/60  Pulse: 69 67  Resp:  17  Temp: 98.5 F (36.9 C) 98.6 F (37 C)  SpO2: 98% 95%     General: Pt is alert, awake, not in acute distress Cardiovascular: RRR, S1/S2 +, no rubs, no gallops Respiratory: CTA bilaterally, no wheezing, no rhonchi Abdominal: Soft, NT, ND, bowel sounds + Extremities: no edema, no cyanosis    The results of significant diagnostics from this hospitalization (including imaging,  microbiology, ancillary and laboratory) are listed below for reference.     Microbiology: Recent Results (from the past 240 hour(s))  Resp Panel by RT-PCR (Flu A&B, Covid) Nasopharyngeal Swab     Status: None   Collection Time: 05/08/21 10:00 PM   Specimen: Nasopharyngeal Swab; Nasopharyngeal(NP) swabs in vial transport medium  Result Value Ref Range Status   SARS Coronavirus 2 by RT PCR NEGATIVE NEGATIVE Final    Comment: (NOTE) SARS-CoV-2 target nucleic acids are NOT DETECTED.  The SARS-CoV-2 RNA is generally detectable in upper respiratory specimens during the acute phase of infection. The lowest concentration of SARS-CoV-2 viral copies this assay can detect is 138 copies/mL. A negative result does not preclude SARS-Cov-2 infection and should not be used as the sole basis for treatment or other patient management decisions. A negative result may occur with  improper specimen collection/handling, submission of specimen other than nasopharyngeal swab, presence of viral mutation(s) within the areas targeted by this assay, and inadequate number of viral copies(<138 copies/mL). A negative result must be combined with clinical observations, patient history, and epidemiological information. The expected result is Negative.  Fact Sheet for Patients:  EntrepreneurPulse.com.au  Fact Sheet for Healthcare Providers:  IncredibleEmployment.be  This test is no t yet approved or cleared by the [place] FDA and   has been authorized for detection and/or diagnosis of SARS-CoV-2 by FDA under an Emergency Use Authorization (EUA). This EUA will remain  in effect (meaning this test can be used) for the duration of the COVID-19 declaration under Section 564(b)(1) of the Act, 21 U.S.C.section 360bbb-3(b)(1), unless the authorization is terminated  or revoked sooner.       Influenza A by PCR NEGATIVE NEGATIVE Final   Influenza B by PCR NEGATIVE NEGATIVE Final    Comment: (NOTE) The Xpert Xpress SARS-CoV-2/FLU/RSV plus assay is intended as an aid in the diagnosis of influenza from Nasopharyngeal swab specimens and should not be used as a sole basis for treatment. Nasal washings and aspirates are unacceptable for Xpert Xpress SARS-CoV-2/FLU/RSV testing.  Fact Sheet for Patients: EntrepreneurPulse.com.au  Fact Sheet for Healthcare Providers: IncredibleEmployment.be  This test is not yet approved or cleared by the [place] FDA and has been authorized for detection and/or diagnosis of SARS-CoV-2 by FDA under an Emergency Use Authorization (EUA). This EUA will remain in effect (meaning this test can be used) for the duration of the COVID-19 declaration under Section 564(b)(1) of the Act, 21 U.S.C. section 360bbb-3(b)(1), unless the authorization is terminated or revoked.  Performed at [facility], [place] [address] [place],  [address]   Resp Panel by RT-PCR (Flu A&B, Covid) Nasopharyngeal Swab     Status: Abnormal   Collection Time: 05/12/21  5:27 PM   Specimen: Nasopharyngeal Swab; Nasopharyngeal(NP) swabs in vial transport medium  Result Value Ref Range Status   SARS Coronavirus 2 by RT PCR POSITIVE (A) NEGATIVE Final    Comment: (NOTE) SARS-CoV-2 target nucleic acids are DETECTED.  The SARS-CoV-2 RNA is generally detectable in upper respiratory specimens during the acute phase of infection. Positive results are indicative of  the presence of the identified virus, but do not rule out bacterial infection or co-infection with other pathogens not detected by the test. Clinical correlation with patient history and other diagnostic information is necessary to determine patient infection status. The expected result is Negative.  Fact Sheet for Patients: EntrepreneurPulse.com.au  Fact Sheet for Healthcare Providers: IncredibleEmployment.be  This test is not yet approved or cleared by  the [employer] and  has been authorized for detection and/or diagnosis of SARS-CoV-2 by FDA under an Emergency Use Authorization (EUA).  This EUA will remain in effect (meaning this test can be used) for the duration of  the COVID-19 declaration under Section 564(b)(1) of the A ct, 21 U.S.C. section 360bbb-3(b)(1), unless the authorization is terminated or revoked sooner.     Influenza A by PCR NEGATIVE NEGATIVE Final   Influenza B by PCR NEGATIVE NEGATIVE Final    Comment: (NOTE) The Xpert Xpress SARS-CoV-2/FLU/RSV plus assay is intended as an aid in the diagnosis of influenza from Nasopharyngeal swab specimens and should not be used as a sole basis for treatment. Nasal washings and aspirates are unacceptable for Xpert Xpress SARS-CoV-2/FLU/RSV testing.  Fact Sheet for Patients: EntrepreneurPulse.com.au  Fact Sheet for Healthcare Providers: IncredibleEmployment.be  This test is not yet approved or cleared by the [place] FDA and has been authorized for detection and/or diagnosis of SARS-CoV-2 by FDA under an Emergency Use Authorization (EUA). This EUA will remain in effect (meaning this test can be used) for the duration of the COVID-19 declaration under Section 564(b)(1) of the Act, 21 U.S.C. section 360bbb-3(b)(1), unless the authorization is terminated or revoked.  Performed at [facility], [place] [address] [place],  [place] [address]      Labs: BNP (last 3 results) No results for input(s): BNP in the last 8760 hours. Basic Metabolic Panel: Recent Labs  Lab 05/08/21 2203 05/12/21 1644 05/13/21 0440 05/14/21 0817  NA 137 133* 132* 135  K 3.9 3.7 3.8 3.6  CL 101 96* 97* 100  CO2 26 26 25 26   GLUCOSE 107* 110* 109* 160*  BUN 26* 21 23 19   CREATININE 1.08* 1.46* 1.26* 1.17*  CALCIUM 9.6 8.9 8.3* 8.5*  MG 1.9  --   --   --    Liver Function Tests: Recent Labs  Lab 05/12/21 1644  AST 27  ALT 20  ALKPHOS 55  BILITOT 0.5  PROT 6.7  ALBUMIN 3.5   No results for input(s): LIPASE, AMYLASE in the last 168 hours. No results for input(s): AMMONIA in the last 168 hours. CBC: Recent Labs  Lab 05/08/21 2203 05/12/21 1644 05/13/21 0440 05/14/21 0817  WBC 6.2 2.5* 2.0* 2.1*  NEUTROABS  --  1.2* 1.0*  --   HGB 13.0 12.8 11.6* 11.4*  HCT 40.2 40.2 36.5 35.8*  MCV 96.2 95.3 95.1 93.7  PLT 201 155 156 133*   Cardiac Enzymes: No results for input(s): CKTOTAL, CKMB, CKMBINDEX, TROPONINI in the last 168 hours. BNP: Invalid input(s): POCBNP CBG: Recent Labs  Lab 05/13/21 1640 05/13/21 2304 05/14/21 0834 05/14/21 1300 05/14/21 1655  GLUCAP 136* 139* 154* 170* 123*   D-Dimer No results for input(s): DDIMER in the last 72 hours. Hgb A1c No results for input(s): HGBA1C in the last 72 hours. Lipid Profile No results for input(s): CHOL, HDL, LDLCALC, TRIG, CHOLHDL, LDLDIRECT in the last 72 hours. Thyroid function studies No results for input(s): TSH, T4TOTAL, T3FREE, THYROIDAB in the last 72 hours.  Invalid input(s): FREET3 Anemia work up No results for input(s): VITAMINB12, FOLATE, FERRITIN, TIBC, IRON, RETICCTPCT in the last 72 hours. Urinalysis    Component Value Date/Time   COLORURINE YELLOW 05/12/2021 1644   APPEARANCEUR CLEAR 05/12/2021 1644   LABSPEC 1.016 05/12/2021 1644   PHURINE 5.0 05/12/2021 [place] 05/12/2021 1644   HGBUR SMALL (A) 05/12/2021 [place] 05/12/2021 1644  KETONESUR NEGATIVE 05/12/2021 [place] 05/12/2021 1644   UROBILINOGEN 1.0 10/05/2009 1218   NITRITE NEGATIVE 05/12/2021 [place] 05/12/2021 1644   Sepsis Labs Invalid input(s): PROCALCITONIN,  WBC,  LACTICIDVEN Microbiology Recent Results (from the past 240 hour(s))  Resp Panel by RT-PCR (Flu A&B, Covid) Nasopharyngeal Swab     Status: None   Collection Time: 05/08/21 10:00 PM   Specimen: Nasopharyngeal Swab; Nasopharyngeal(NP) swabs in vial transport medium  Result Value Ref Range Status   SARS Coronavirus 2 by RT PCR NEGATIVE NEGATIVE Final    Comment: (NOTE) SARS-CoV-2 target nucleic acids are NOT DETECTED.  The SARS-CoV-2 RNA is generally detectable in upper respiratory specimens during the acute phase of infection. The lowest concentration of SARS-CoV-2 viral copies this assay can detect is 138 copies/mL. A negative result does not preclude SARS-Cov-2 infection and should not be used as the sole basis for treatment or other patient management decisions. A negative result may occur with  improper specimen collection/handling, submission of specimen other than nasopharyngeal swab, presence of viral mutation(s) within the areas targeted by this assay, and inadequate number of viral copies(<138 copies/mL). A negative result must be combined with clinical observations, patient history, and epidemiological information. The expected result is Negative.  Fact Sheet for Patients:  EntrepreneurPulse.com.au  Fact Sheet for Healthcare Providers:  IncredibleEmployment.be  This test is no t yet approved or cleared by the [place] FDA and  has been authorized for detection and/or diagnosis of SARS-CoV-2 by FDA under an Emergency Use Authorization (EUA). This EUA will remain  in effect (meaning this test can be used) for the duration of the COVID-19 declaration under  Section 564(b)(1) of the Act, 21 U.S.C.section 360bbb-3(b)(1), unless the authorization is terminated  or revoked sooner.       Influenza A by PCR NEGATIVE NEGATIVE Final   Influenza B by PCR NEGATIVE NEGATIVE Final    Comment: (NOTE) The Xpert Xpress SARS-CoV-2/FLU/RSV plus assay is intended as an aid in the diagnosis of influenza from Nasopharyngeal swab specimens and should not be used as a sole basis for treatment. Nasal washings and aspirates are unacceptable for Xpert Xpress SARS-CoV-2/FLU/RSV testing.  Fact Sheet for Patients: EntrepreneurPulse.com.au  Fact Sheet for Healthcare Providers: IncredibleEmployment.be  This test is not yet approved or cleared by the [place] FDA and has been authorized for detection and/or diagnosis of SARS-CoV-2 by FDA under an Emergency Use Authorization (EUA). This EUA will remain in effect (meaning this test can be used) for the duration of the COVID-19 declaration under Section 564(b)(1) of the Act, 21 U.S.C. section 360bbb-3(b)(1), unless the authorization is terminated or revoked.  Performed at [facility], [place] [address] [place], [place] [address]   Resp Panel by RT-PCR (Flu A&B, Covid) Nasopharyngeal Swab     Status: Abnormal   Collection Time: 05/12/21  5:27 PM   Specimen: Nasopharyngeal Swab; Nasopharyngeal(NP) swabs in vial transport medium  Result Value Ref Range Status   SARS Coronavirus 2 by RT PCR POSITIVE (A) NEGATIVE Final    Comment: (NOTE) SARS-CoV-2 target nucleic acids are DETECTED.  The SARS-CoV-2 RNA is generally detectable in upper respiratory specimens during the acute phase of infection. Positive results are indicative of the presence of the identified virus, but do not rule out bacterial infection or co-infection with other pathogens not detected by the test. Clinical correlation with patient history and other diagnostic information is necessary to  determine patient infection status. The expected  result is Negative.  Fact Sheet for Patients: EntrepreneurPulse.com.au  Fact Sheet for Healthcare Providers: IncredibleEmployment.be  This test is not yet approved or cleared by the [place] FDA and  has been authorized for detection and/or diagnosis of SARS-CoV-2 by FDA under an Emergency Use Authorization (EUA).  This EUA will remain in effect (meaning this test can be used) for the duration of  the COVID-19 declaration under Section 564(b)(1) of the A ct, 21 U.S.C. section 360bbb-3(b)(1), unless the authorization is terminated or revoked sooner.     Influenza A by PCR NEGATIVE NEGATIVE Final   Influenza B by PCR NEGATIVE NEGATIVE Final    Comment: (NOTE) The Xpert Xpress SARS-CoV-2/FLU/RSV plus assay is intended as an aid in the diagnosis of influenza from Nasopharyngeal swab specimens and should not be used as a sole basis for treatment. Nasal washings and aspirates are unacceptable for Xpert Xpress SARS-CoV-2/FLU/RSV testing.  Fact Sheet for Patients: EntrepreneurPulse.com.au  Fact Sheet for Healthcare Providers: IncredibleEmployment.be  This test is not yet approved or cleared by the [place] FDA and has been authorized for detection and/or diagnosis of SARS-CoV-2 by FDA under an Emergency Use Authorization (EUA). This EUA will remain in effect (meaning this test can be used) for the duration of the COVID-19 declaration under Section 564(b)(1) of the Act, 21 U.S.C. section 360bbb-3(b)(1), unless the authorization is terminated or revoked.  Performed at [facility], [place] [address], [place], [place] [address]      Time coordinating discharge: 40 minutes  SIGNED:   Elmarie Shiley, MD  [facility]

## 2021-05-14 NOTE — Progress Notes (Signed)
Physical Therapy Treatment Patient Details Name: Elizabeth [name] MRN: [id number] DOB: 05-Oct-1940 Today's Date: 05/14/2021   History of Present Illness Pt is a 81 y.o. female admitted with AKI and generalized weakness x 3 days with 2 recent falls.  W/u revealed covid +; PMH significant for T2DM, HTN, CKD stage IIIa, asthma, GERD, chronic lower extremity swelling due to venous insufficiency.    PT Comments    Pt is progressing towards her physical therapy goals. Ambulating 40 feet with a quad cane at a min guard assist level. SpO2 96% on RA. Pt presents with balance deficits, gait abnormalities, weakness and decreased endurance. Will benefit from follow up HHPT at discharge .    Recommendations for follow up therapy are one component of a multi-disciplinary discharge planning process, led by the attending physician.  Recommendations may be updated based on patient status, additional functional criteria and insurance authorization.  Follow Up Recommendations  Home health PT     Assistance Recommended at Discharge PRN  Patient can return home with the following A little help with walking and/or transfers   Equipment Recommendations  None recommended by PT    Recommendations for Other Services       Precautions / Restrictions Precautions Precautions: Fall Restrictions Weight Bearing Restrictions: No     Mobility  Bed Mobility Overal bed mobility: Modified Independent                  Transfers Overall transfer level: Needs assistance Equipment used: Quad cane Transfers: Sit to/from Stand Sit to Stand: Min guard           General transfer comment: elevated bed height, no physical assist to rise    Ambulation/Gait Ambulation/Gait assistance: Min guard Gait Distance (Feet): 40 Feet Assistive device: Rolling walker (2 wheels) Gait Pattern/deviations: Decreased stride length, Step-through pattern, Shuffle Gait velocity: decreased Gait velocity interpretation:  <1.8 ft/sec, indicate of risk for recurrent falls   General Gait Details: decreased bilateral foot clearance, shuffling type gait pattern, no overt LOB. min guard assist for safety   Stairs             Wheelchair Mobility    Modified Rankin (Stroke Patients Only)       Balance Overall balance assessment: Needs assistance Sitting-balance support: No upper extremity supported, Feet supported Sitting balance-Leahy Scale: Good     Standing balance support: Single extremity supported, During functional activity Standing balance-Leahy Scale: Fair                              Cognition Arousal/Alertness: Awake/alert Behavior During Therapy: WFL for tasks assessed/performed Overall Cognitive Status: No family/caregiver present to determine baseline cognitive functioning                                 General Comments: difficult to understand at times        Exercises      General Comments        Pertinent Vitals/Pain Pain Assessment Pain Assessment: Faces Faces Pain Scale: Hurts little more Pain Location: back Pain Descriptors / Indicators: Grimacing Pain Intervention(s): Monitored during session    Home Living                          Prior Function            PT Goals (  current goals can now be found in the care plan section) Acute Rehab PT Goals Patient Stated Goal: get stronger PT Goal Formulation: With patient Time For Goal Achievement: 05/20/21 Potential to Achieve Goals: Good Progress towards PT goals: Progressing toward goals    Frequency    Min 3X/week      PT Plan Current plan remains appropriate    Co-evaluation              AM-PAC PT "6 Clicks" Mobility   Outcome Measure  Help needed turning from your back to your side while in a flat bed without using bedrails?: None Help needed moving from lying on your back to sitting on the side of a flat bed without using bedrails?: None Help  needed moving to and from a bed to a chair (including a wheelchair)?: A Little Help needed standing up from a chair using your arms (e.g., wheelchair or bedside chair)?: A Little Help needed to walk in hospital room?: A Little Help needed climbing 3-5 steps with a railing? : A Lot 6 Click Score: 19    End of Session Equipment Utilized During Treatment: Gait belt Activity Tolerance: Patient tolerated treatment well Patient left: in chair;with call bell/phone within reach Nurse Communication: Mobility status PT Visit Diagnosis: Unsteadiness on feet (R26.81);Other abnormalities of gait and mobility (R26.89);History of falling (Z91.81)     Time: [id number] PT Time Calculation (min) (ACUTE ONLY): 23 min  Charges:  $Therapeutic Activity: 23-37 mins                     Wyona Almas, PT, DPT Acute Rehabilitation Services Pager [phone] Office [phone]    Deno Etienne 05/14/2021, 2:15 PM

## 2021-05-14 NOTE — TOC Progression Note (Signed)
Transition of Care [facility]) - Progression Note    Patient Details  Name: Elizabeth [name] MRN: [id number] Date of Birth: 06-22-1940  Transition of Care [facility]) CM/SW Contact  Tom-Johnson, Renea Ee, RN Phone Number: 05/14/2021, 10:05 AM  Clinical Narrative:    CM received a call from [facility] with [facility] care that they will not be able to service patient's home health PT/OT referral. CM notified patient's daughter, Elizabeth [name] and she chose [facility]. CM called [facility] and referral made with Amy with acceptance voiced.  [place] requested hospital bed, bedside commode and rollator. Order placed and called to [facility] with [employer]. Equipment to be delivered to patient's address. CM will continue to follow with needs.   Expected Discharge Plan: [place] Barriers to Discharge: Continued Medical Work up  Expected Discharge Plan and Services Expected Discharge Plan: [place]   Discharge Planning Services: CM Consult Post Acute Care Choice: [place] arrangements for the past 2 months: Single Family Home                           HH Arranged: PT, OT HH Agency: [place] Date [place]: 05/13/21 Time [place]: [place] Representative spoke with at [place]: [place] ([place]) Interventions    Readmission Risk Interventions No flowsheet data found.

## 2021-05-14 NOTE — TOC Transition Note (Signed)
Transition of Care [facility]) - CM/SW Discharge Note   Patient Details  Name: Elizabeth [name] MRN: [id number] Date of Birth: 10-Feb-1941  Transition of Care [facility]) CM/SW Contact:  Tom-Johnson, Hershal Coria, RN Phone Number: 05/14/2021, 12:40 PM   Clinical Narrative:    Patient is scheduled for discharge today. Hospital bed, bedside commode and rollator to be delivered tomorrow to patient's home. Daughter, Elizabeth [name] to transport at discharge. No further TOC needs noted.     Barriers to Discharge: Continued Medical Work up   Patient Goals and CMS Choice Patient states their goals for this hospitalization and ongoing recovery are:: return home with family support CMS Medicare.gov Compare Post Acute Care list provided to:: Patient Choice offered to / list presented to : Patient, Adult Children (daughter/POA -- Elizabeth [name])  Discharge Placement                       Discharge Plan and Services   Discharge Planning Services: CM Consult Post Acute Care Choice: Home Health                    HH Arranged: PT, OT [facility] Agency: [facility] Date [facility] Agency Contacted: 05/13/21 Time HH Agency Contacted: [phone] Representative spoke with at [facility] Agency: Marylene Land  Social Determinants of Health (SDOH) Interventions     Readmission Risk Interventions No flowsheet data found.

## 2021-05-25 IMAGING — MG DIGITAL SCREENING BILAT W/ TOMO W/ CAD
6 of 10 series · 6 of 30 positions shown · non-contrast
Comparison: Previous exam(s).

CLINICAL DATA: Screening.

EXAM:
DIGITAL SCREENING BILATERAL MAMMOGRAM WITH TOMO AND CAD

[R CC synth-2D]
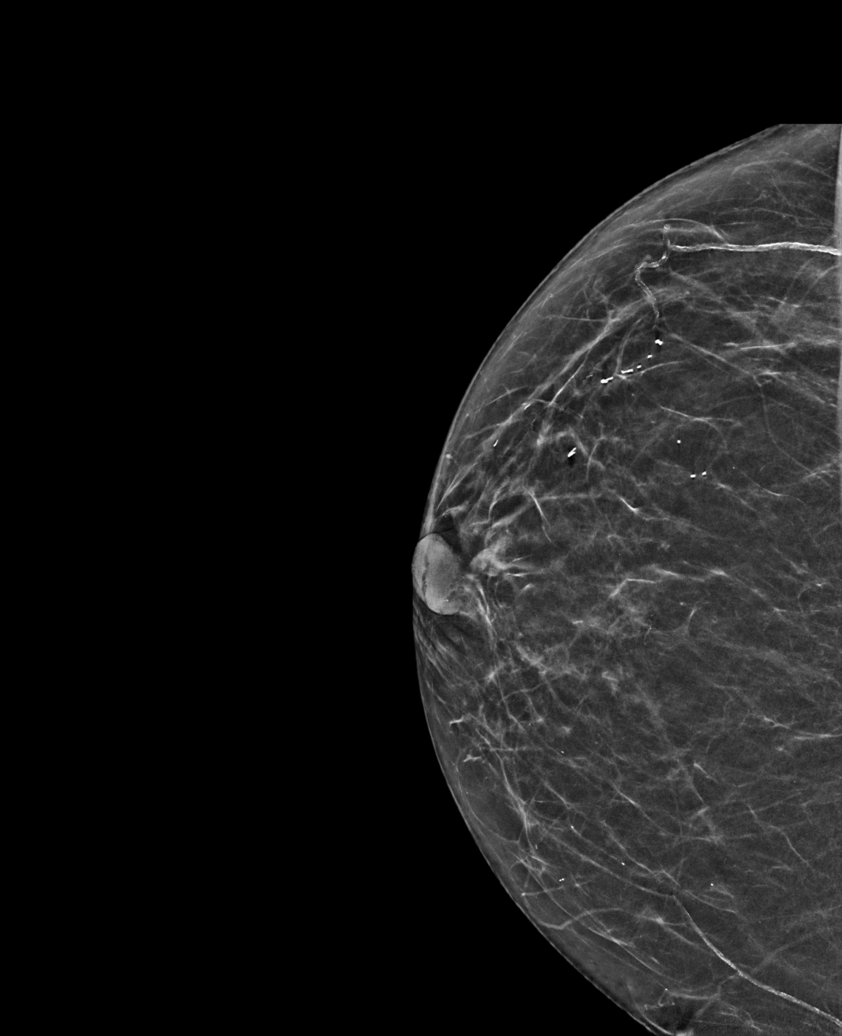

[R CV synth-2D]
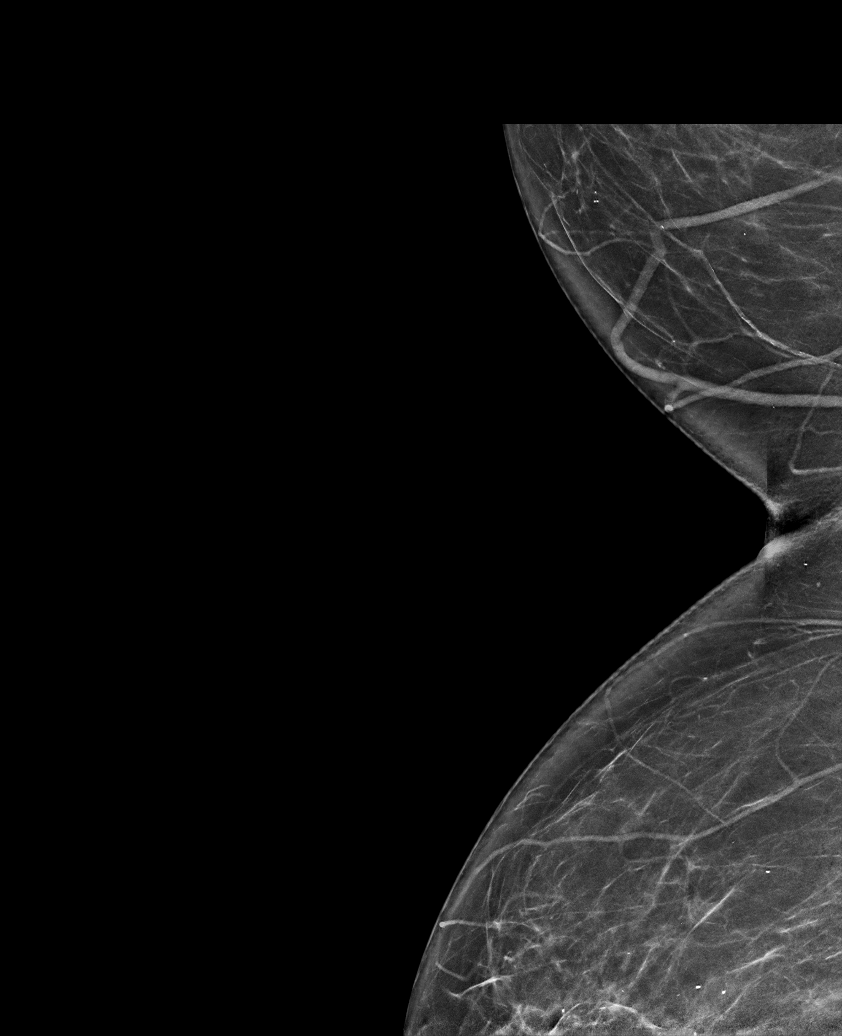

[L MLO synth-2D]
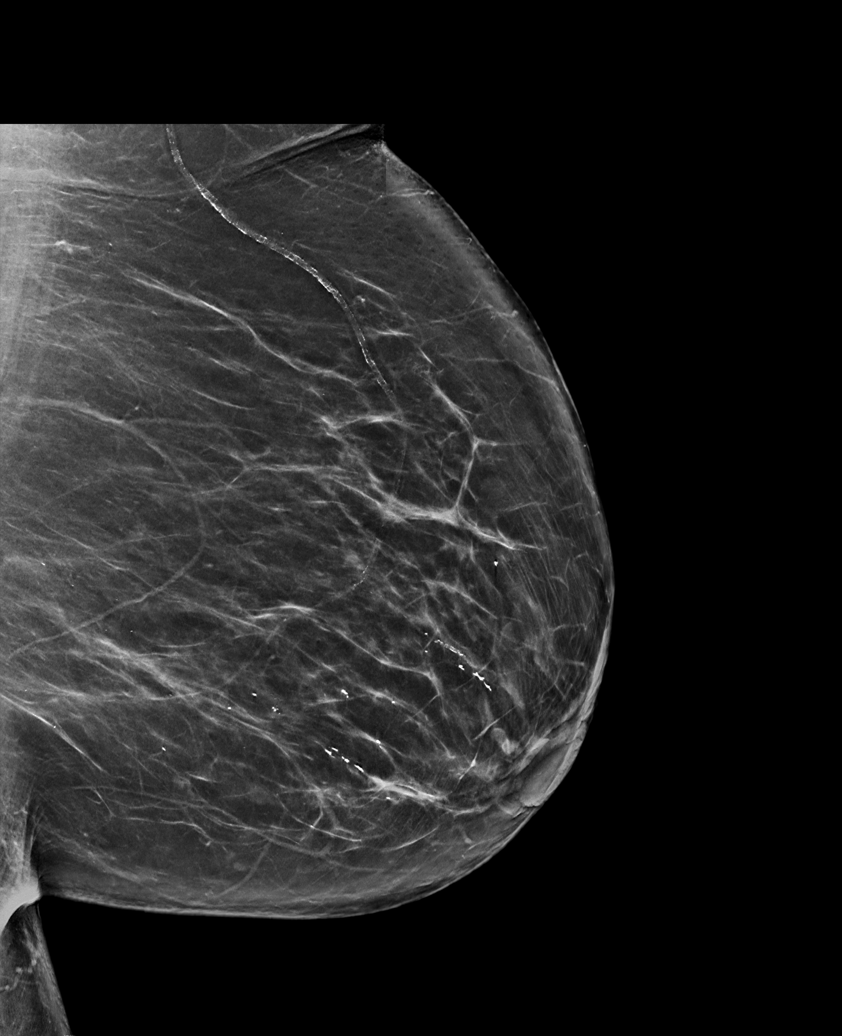

[L CC synth-2D]
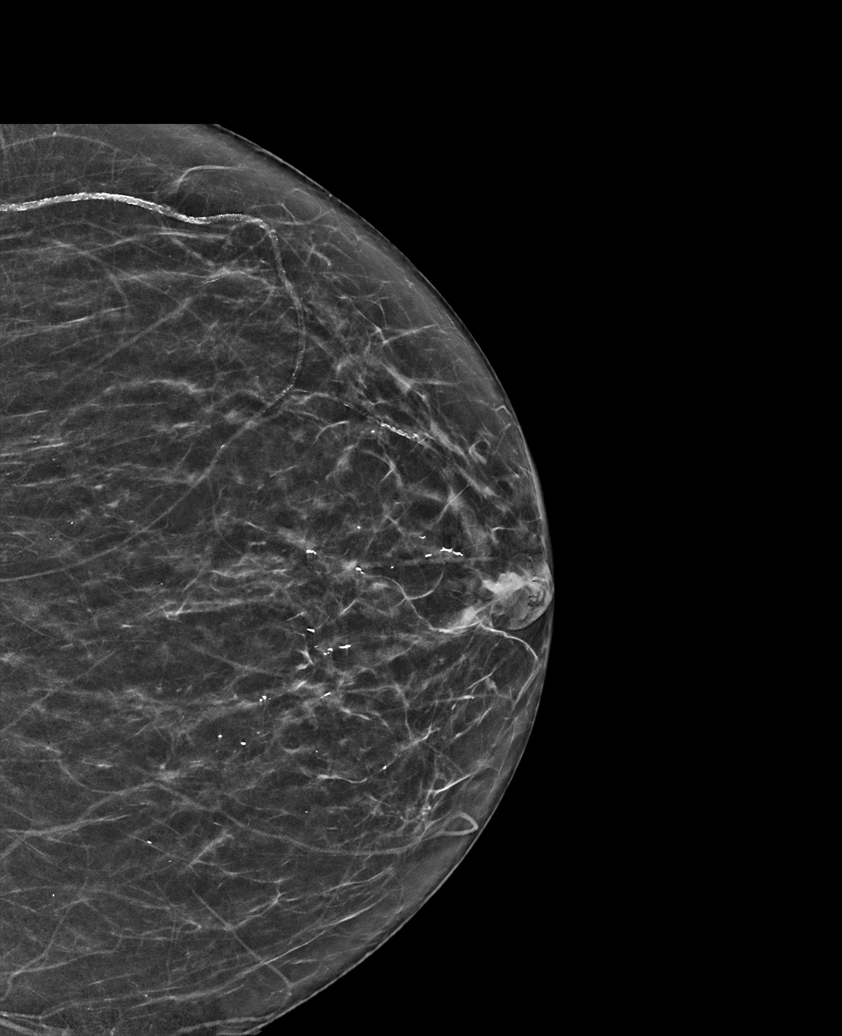

[R MLO synth-2D]
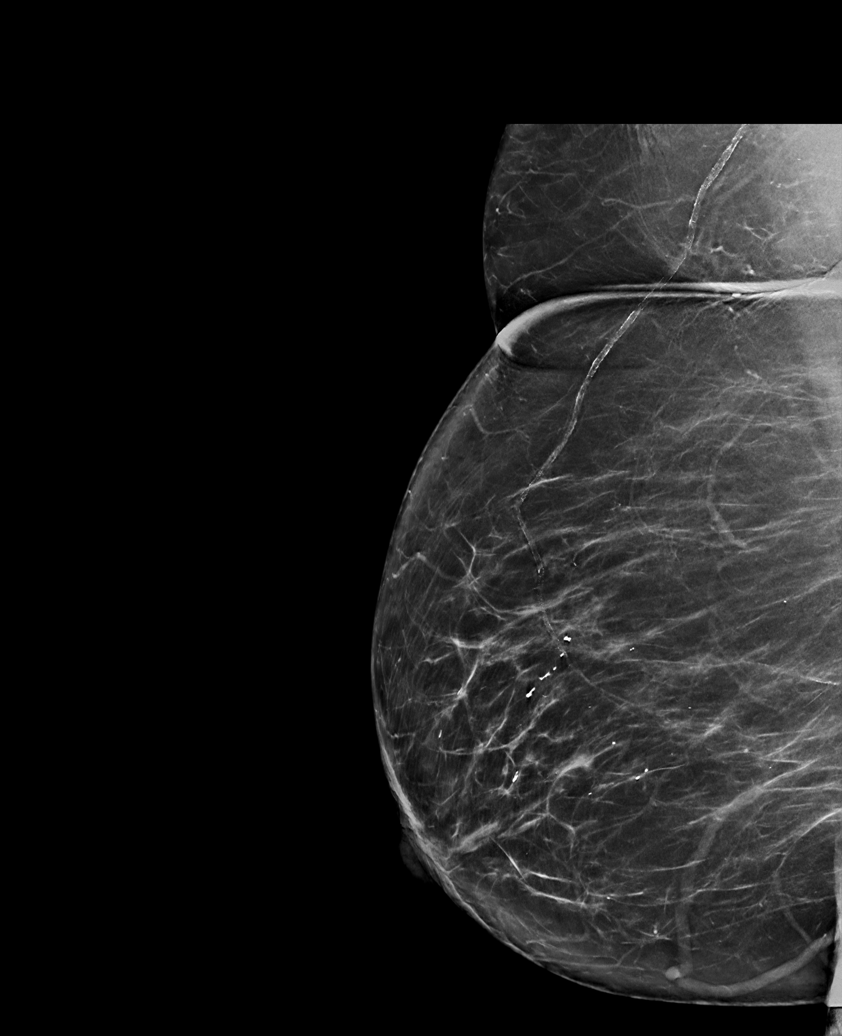

[L CC tomo · tomo slice 34/67.0]
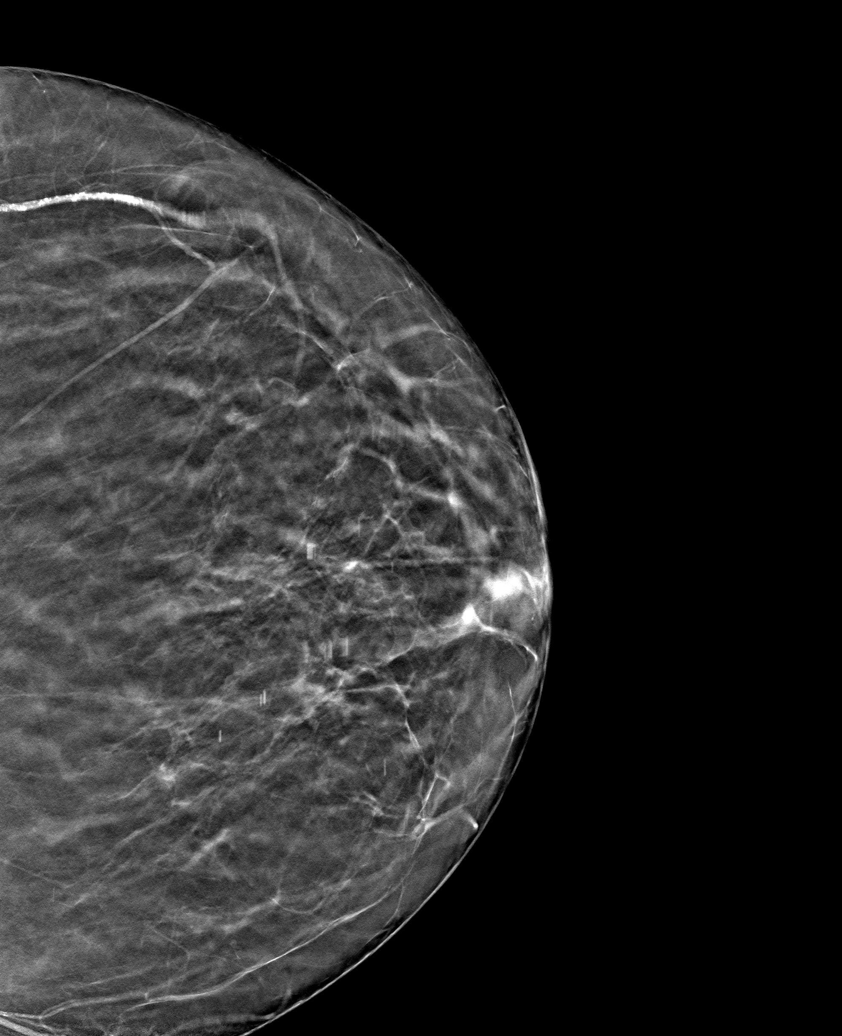

[6 of 30 positions shown; findings below may reference images not displayed]

ACR Breast Density Category b: There are scattered areas of
fibroglandular density.
FINDINGS: There are no findings suspicious for malignancy. Images were
processed with CAD.
IMPRESSION: No mammographic evidence of malignancy. A result letter of this
screening mammogram will be mailed directly to the patient.

RECOMMENDATION:
Screening mammogram in one year. (Code:[id number])

BI-RADS CATEGORY  1: Negative.

## 2021-06-19 ENCOUNTER — Encounter: Payer: Self-pay | Admitting: Podiatry

## 2021-06-19 ENCOUNTER — Other Ambulatory Visit: Payer: Self-pay

## 2021-06-19 ENCOUNTER — Ambulatory Visit (INDEPENDENT_AMBULATORY_CARE_PROVIDER_SITE_OTHER): Payer: Medicare Other | Admitting: Podiatry

## 2021-06-19 DIAGNOSIS — M79675 Pain in left toe(s): Secondary | ICD-10-CM

## 2021-06-19 DIAGNOSIS — M79674 Pain in right toe(s): Secondary | ICD-10-CM

## 2021-06-19 DIAGNOSIS — E119 Type 2 diabetes mellitus without complications: Secondary | ICD-10-CM | POA: Insufficient documentation

## 2021-06-19 DIAGNOSIS — B351 Tinea unguium: Secondary | ICD-10-CM

## 2021-06-19 NOTE — Progress Notes (Signed)
This patient returns to my office for at risk foot care.  This patient requires this care by a professional since this patient will be at risk due to having type 2 diabetes and chronic venous insufficiency. This patient is unable to cut nails herself since the patient cannot reach his nails.These nails are painful walking and wearing shoes.  This patient presents for at risk foot care today.  General Appearance  Alert, conversant and in no acute stress.  Vascular  Dorsalis pedis and posterior tibial  pulses are palpable  bilaterally.  Capillary return is within normal limits  bilaterally. Temperature is within normal limits  bilaterally.  Neurologic  Senn-Weinstein monofilament wire test diminished   bilaterally. Muscle power within normal limits bilaterally.  Nails Thick disfigured discolored nails with subungual debris  from hallux to fifth toes bilaterally. No evidence of bacterial infection or drainage bilaterally.  Orthopedic  No limitations of motion  feet .  No crepitus or effusions noted.  No bony pathology or digital deformities noted.  Pes planus  Skin  normotropic skin with no porokeratosis noted bilaterally.  No signs of infections or ulcers noted.     Onychomycosis  Pain in right toes  Pain in left toes  Consent was obtained for treatment procedures.   Mechanical debridement of nails 1-5  bilaterally performed with a nail nipper.  Filed with dremel without incident. Discussed possible diabetic shoes for this patient.   Return office visit   3 months                   Told patient to return for periodic foot care and evaluation due to potential at risk complications.   Helane Gunther DPM

## 2021-07-19 ENCOUNTER — Institutional Professional Consult (permissible substitution): Payer: Medicare Other | Admitting: Pulmonary Disease

## 2021-09-18 ENCOUNTER — Ambulatory Visit (INDEPENDENT_AMBULATORY_CARE_PROVIDER_SITE_OTHER): Payer: Medicare Other | Admitting: Podiatry

## 2021-09-18 ENCOUNTER — Encounter: Payer: Self-pay | Admitting: Podiatry

## 2021-09-18 DIAGNOSIS — B351 Tinea unguium: Secondary | ICD-10-CM

## 2021-09-18 DIAGNOSIS — M79675 Pain in left toe(s): Secondary | ICD-10-CM

## 2021-09-18 DIAGNOSIS — M79674 Pain in right toe(s): Secondary | ICD-10-CM | POA: Diagnosis not present

## 2021-09-18 DIAGNOSIS — E119 Type 2 diabetes mellitus without complications: Secondary | ICD-10-CM

## 2021-09-18 NOTE — Progress Notes (Signed)
This patient returns to my office for at risk foot care.  This patient requires this care by a professional since this patient will be at risk due to having type 2 diabetes and chronic venous insufficiency. This patient is unable to cut nails herself since the patient cannot reach his nails.These nails are painful walking and wearing shoes.  This patient presents for at risk foot care today.  General Appearance  Alert, conversant and in no acute stress.  Vascular  Dorsalis pedis and posterior tibial  pulses are palpable  bilaterally.  Capillary return is within normal limits  bilaterally. Temperature is within normal limits  bilaterally.  Neurologic  Senn-Weinstein monofilament wire test diminished   bilaterally. Muscle power within normal limits bilaterally.  Nails Thick disfigured discolored nails with subungual debris  from hallux to fifth toes bilaterally. No evidence of bacterial infection or drainage bilaterally.  Orthopedic  No limitations of motion  feet .  No crepitus or effusions noted.  No bony pathology or digital deformities noted.  Pes planus  Skin  normotropic skin with no porokeratosis noted bilaterally.  No signs of infections or ulcers noted.     Onychomycosis  Pain in right toes  Pain in left toes  Consent was obtained for treatment procedures.   Mechanical debridement of nails 1-5  bilaterally performed with a nail nipper.  Filed with dremel without incident.    Return office visit   3 months                   Told patient to return for periodic foot care and evaluation due to potential at risk complications.   Helane Gunther DPM

## 2021-12-19 ENCOUNTER — Ambulatory Visit (INDEPENDENT_AMBULATORY_CARE_PROVIDER_SITE_OTHER): Payer: Medicare Other | Admitting: Podiatry

## 2021-12-19 ENCOUNTER — Encounter: Payer: Self-pay | Admitting: Podiatry

## 2021-12-19 DIAGNOSIS — E119 Type 2 diabetes mellitus without complications: Secondary | ICD-10-CM

## 2021-12-19 DIAGNOSIS — M79674 Pain in right toe(s): Secondary | ICD-10-CM | POA: Diagnosis not present

## 2021-12-19 DIAGNOSIS — B351 Tinea unguium: Secondary | ICD-10-CM | POA: Diagnosis not present

## 2021-12-19 DIAGNOSIS — I872 Venous insufficiency (chronic) (peripheral): Secondary | ICD-10-CM

## 2021-12-19 DIAGNOSIS — M79675 Pain in left toe(s): Secondary | ICD-10-CM | POA: Diagnosis not present

## 2021-12-19 NOTE — Progress Notes (Signed)
This patient returns to my office for at risk foot care.  This patient requires this care by a professional since this patient will be at risk due to having type 2 diabetes and chronic venous insufficiency. This patient is unable to cut nails herself since the patient cannot reach his nails.These nails are painful walking and wearing shoes.  This patient presents for at risk foot care today.  General Appearance  Alert, conversant and in no acute stress.  Vascular  Dorsalis pedis and posterior tibial  pulses are palpable  bilaterally.  Capillary return is within normal limits  bilaterally. Temperature is within normal limits  bilaterally.  Neurologic  Senn-Weinstein monofilament wire test diminished   bilaterally. Muscle power within normal limits bilaterally.  Nails Thick disfigured discolored nails with subungual debris  from hallux to fifth toes bilaterally. No evidence of bacterial infection or drainage bilaterally.  Orthopedic  No limitations of motion  feet .  No crepitus or effusions noted.  No bony pathology or digital deformities noted.  Pes planus  Skin  normotropic skin with no porokeratosis noted bilaterally.  No signs of infections or ulcers noted.   Dry scaly skin.  Onychomycosis  Pain in right toes  Pain in left toes  Consent was obtained for treatment procedures.   Mechanical debridement of nails 1-5  bilaterally performed with a nail nipper.  Filed with dremel without incident.    Return office visit   3 months                   Told patient to return for periodic foot care and evaluation due to potential at risk complications.   Lily Velasquez DPM   

## 2022-01-15 ENCOUNTER — Ambulatory Visit (INDEPENDENT_AMBULATORY_CARE_PROVIDER_SITE_OTHER): Payer: Medicare Other | Admitting: Pulmonary Disease

## 2022-01-15 ENCOUNTER — Encounter: Payer: Self-pay | Admitting: Pulmonary Disease

## 2022-01-15 VITALS — BP 120/80 | HR 84 | Ht 63.0 in | Wt 283.2 lb

## 2022-01-15 DIAGNOSIS — G4733 Obstructive sleep apnea (adult) (pediatric): Secondary | ICD-10-CM | POA: Diagnosis not present

## 2022-01-15 NOTE — Progress Notes (Signed)
[facility]  Chief Complaint  Patient presents with   Consult    Cpap compliance     Past Surgical History:  She  has a past surgical history that includes Abdominal hysterectomy (1996); Thyroid surgery (2001); and Cystectomy (1980's).  Past Medical History:  Arthritis, Asthma, GERD, HTN, DM type 2, COVID January 2023  Constitutional:  BP 120/80 (BP Location: Left Arm)   Pulse 84   Ht 5\' 3"  (1.6 m)   Wt 283 lb 3.2 oz (128.5 kg)   SpO2 96%   BMI 50.17 kg/m   Brief Summary:  Elizabeth [name] is a 81 y.o. female former smoker with obstructive sleep apnea.      Subjective:   She is here with her daughter.  She had a sleep study years ago and was using CPAP.  She got supplies from [place].  She hasn't used her CPAP in about a year.  She can't sleep laying flat because of hip pain.  She snores and stops breathing when asleep.  She naps daily.    She goes to sleep at 11 pm.  She falls asleep in about an hour.  She wakes up 3 times to use the bathroom.  She gets out of bed at 7 am.  She feels okay in the morning.  She denies morning headache.  She does not use anything to help her fall sleep or stay awake.  She denies sleep walking, sleep talking, bruxism, or nightmares.  There is no history of restless legs.  She denies sleep hallucinations, sleep paralysis, or cataplexy.  The Epworth score is 8 out of 24.   Physical Exam:   Appearance - well kempt   ENMT - no sinus tenderness, no oral exudate, no LAN, Mallampati 3 airway, no stridor, wears dentures  Respiratory - equal breath sounds bilaterally, no wheezing or rales  CV - s1s2 regular rate and rhythm, no murmurs  Ext - no clubbing, no edema  Skin - no rashes  Psych - normal mood and affect   Sleep Tests:    Cardiac Tests:  Echo 11/19/16 >> EF 55 to 60%, grade 1 DD, mild LVH  Social History:  She  reports that she quit smoking about 58 years ago. Her smoking use  included cigarettes. She has never used smokeless tobacco. She reports that she does not drink alcohol and does not use drugs.  Family History:  Her family history includes Cancer in her maternal uncle; Diabetes in her mother; Heart disease in her mother.    Discussion:  She has snoring, sleep disruption, apnea, and daytime sleepiness.  She has prior history of sleep apnea.  She has history of hypertension.  Her BMI is > 35.  I am almost certain she still has significant obstructive sleep apnea.  Assessment/Plan:   Snoring with excessive daytime sleepiness. - will need to arrange for a home sleep study  Obesity. - discussed how weight can impact sleep and risk for sleep disordered breathing - discussed options to assist with weight loss: combination of diet modification, cardiovascular and strength training exercises  Cardiovascular risk. - had an extensive discussion regarding the adverse health consequences related to untreated sleep disordered breathing - specifically discussed the risks for hypertension, coronary artery disease, cardiac dysrhythmias, cerebrovascular disease, and diabetes - lifestyle modification discussed  Safe driving practices. - discussed how sleep disruption can increase risk of accidents, particularly when driving - safe driving practices were discussed  Therapies for obstructive  sleep apnea. - if the sleep study shows significant sleep apnea, then various therapies for treatment were reviewed: CPAP, oral appliance, and surgical interventions  Time Spent Involved in Patient Care on Day of Examination:  35 minutes  Follow up:   Patient Instructions  Will arrange for home sleep study Will call to arrange for follow up after sleep study reviewed   Medication List:   Allergies as of 01/15/2022   No Known Allergies      Medication List        Accurate as of January 15, 2022  2:39 PM. If you have any questions, ask your nurse or doctor.           acetaminophen 325 MG tablet Commonly known as: TYLENOL Take 2 tablets (650 mg total) by mouth every 6 (six) hours as needed for mild pain or headache (fever >/= 101).   albuterol 108 (90 Base) MCG/ACT inhaler Commonly known as: VENTOLIN HFA Inhale 2 puffs into the lungs every 6 (six) hours as needed. For bronchitis   albuterol (2.5 MG/3ML) 0.083% nebulizer solution Commonly known as: PROVENTIL Take 2.5 mg by nebulization every 6 (six) hours as needed. For bronchitis   budesonide-formoterol 160-4.5 MCG/ACT inhaler Commonly known as: SYMBICORT Inhale 2 puffs into the lungs 2 (two) times daily.   CENTRUM SILVER PO Take 1 tablet by mouth.   diclofenac Sodium 1 % Gel Commonly known as: VOLTAREN Apply 2 g topically 4 (four) times daily.   fluticasone 50 MCG/ACT nasal spray Commonly known as: FLONASE Place 2 sprays into the nose daily.   guaiFENesin-dextromethorphan 100-10 MG/5ML syrup Commonly known as: ROBITUSSIN DM Take 10 mLs by mouth every 4 (four) hours as needed for cough.   metFORMIN 500 MG tablet Commonly known as: GLUCOPHAGE Take 500 mg by mouth 2 (two) times daily with a meal.   oxybutynin 5 MG tablet Commonly known as: DITROPAN Take 5 mg by mouth 2 (two) times daily.   pantoprazole 40 MG tablet Commonly known as: PROTONIX Take 40 mg by mouth daily.   pravastatin 10 MG tablet Commonly known as: PRAVACHOL Take 10 mg by mouth at bedtime.   ZYRTEC PO Take by mouth daily.        Signature:  Chesley Mires, MD [place] Pager - [phone] 01/15/2022, 2:39 PM

## 2022-01-15 NOTE — Patient Instructions (Signed)
Will arrange for home sleep study Will call to arrange for follow up after sleep study reviewed  

## 2022-03-08 ENCOUNTER — Ambulatory Visit: Payer: Medicare Other

## 2022-03-08 DIAGNOSIS — G4733 Obstructive sleep apnea (adult) (pediatric): Secondary | ICD-10-CM | POA: Diagnosis not present

## 2022-03-18 ENCOUNTER — Telehealth: Payer: Self-pay | Admitting: Pulmonary Disease

## 2022-03-18 DIAGNOSIS — G4733 Obstructive sleep apnea (adult) (pediatric): Secondary | ICD-10-CM | POA: Diagnosis not present

## 2022-03-18 NOTE — Telephone Encounter (Addendum)
Spoke with patients daughter Elenora Fender (ok per dpr) regarding HST results. They verbalized understanding. No further questions. Scheduled an appt for 03/28/22 at 11:30 with Rubye Oaks NP.  Nothing further needed at this time.

## 2022-03-18 NOTE — Telephone Encounter (Signed)
HST 03/11/22 >> AHI 13.7, SpO2 low 81%   Please inform her that her sleep study shows mild obstructive sleep apnea.  Please arrange for ROV with me or NP to discuss treatment options.

## 2022-03-20 ENCOUNTER — Encounter (HOSPITAL_BASED_OUTPATIENT_CLINIC_OR_DEPARTMENT_OTHER): Payer: Medicare Other | Admitting: Physician Assistant

## 2022-03-28 ENCOUNTER — Encounter: Payer: Self-pay | Admitting: Adult Health

## 2022-03-28 ENCOUNTER — Ambulatory Visit (INDEPENDENT_AMBULATORY_CARE_PROVIDER_SITE_OTHER): Payer: Medicare Other | Admitting: Adult Health

## 2022-03-28 VITALS — BP 140/84 | HR 90 | Ht 64.0 in | Wt 285.8 lb

## 2022-03-28 DIAGNOSIS — G4733 Obstructive sleep apnea (adult) (pediatric): Secondary | ICD-10-CM | POA: Diagnosis not present

## 2022-03-28 NOTE — Patient Instructions (Signed)
Begin CPAP At bedtime  , wear all night long  Work on healthy weight loss  Do not drive if sleepy  Follow up with Dr. Craige Cotta  in 3 months and As needed

## 2022-03-28 NOTE — Progress Notes (Signed)
@Patient  ID: , female    DOB: 1941-04-22, 81 y.o.   MRN: [id number]  Chief Complaint  Patient presents with   Follow-up    Referring provider: No ref. provider found  HPI: 81 year old female seen for sleep consult January 15, 2022 to establish for sleep apnea  TEST/EVENTS :  HST 03/11/22 >> AHI 13.7, SpO2 low 81%   03/28/2022 Follow up ; OSA  Patient returns for a 87-month follow-up.  Patient was seen last visit for a sleep consult to establish for sleep apnea.  Patient had previously been on CPAP for several years but had a break in therapy over the last year.  She wanted to restart on CPAP.  She required a repeat sleep study.  Patient was set up for home sleep study in March 11, 2022 that showed AHI at 13.7 and SpO2 low at 81%.  We discussed her sleep study results in detail with patient and her daughter.  Patient says that she felt better when she was wearing CPAP.  She had less daytime sleepiness and a more sound sleep.    No Known Allergies   There is no immunization history on file for this patient.  Past Medical History:  Diagnosis Date   Arthritis    Asthma    Breast mass in female    Bronchitis    Bronchitis    Constipation    Cough    GERD (gastroesophageal reflux disease)    Hypertension associated with diabetes (HCC)    Type 2 diabetes mellitus (HCC)    Wheezing     Tobacco History: Social History   Tobacco Use  Smoking Status Former   Types: Cigarettes   Quit date: 01/17/1964   Years since quitting: 58.2  Smokeless Tobacco Never   Counseling given: Not Answered   Outpatient Medications Prior to Visit  Medication Sig Dispense Refill   acetaminophen (TYLENOL) 325 MG tablet Take 2 tablets (650 mg total) by mouth every 6 (six) hours as needed for mild pain or headache (fever >/= 101). 10 tablet 0   albuterol (PROVENTIL) (2.5 MG/3ML) 0.083% nebulizer solution Take 2.5 mg by nebulization every 6 (six) hours as needed. For bronchitis      budesonide-formoterol (SYMBICORT) 160-4.5 MCG/ACT inhaler Inhale 2 puffs into the lungs 2 (two) times daily.     Cetirizine HCl (ZYRTEC PO) Take by mouth daily.     GEMTESA 75 MG TABS Take 1 tablet by mouth daily.     hydrochlorothiazide (HYDRODIURIL) 25 MG tablet Take 25 mg by mouth daily.     metFORMIN (GLUCOPHAGE) 500 MG tablet Take 500 mg by mouth 2 (two) times daily with a meal.     potassium chloride (KLOR-CON) 10 MEQ tablet Take 20 mEq by mouth daily.     albuterol (PROVENTIL HFA;VENTOLIN HFA) 108 (90 BASE) MCG/ACT inhaler Inhale 2 puffs into the lungs every 6 (six) hours as needed. For bronchitis (Patient not taking: Reported on 03/28/2022)     diclofenac Sodium (VOLTAREN) 1 % GEL Apply 2 g topically 4 (four) times daily. (Patient not taking: Reported on 03/28/2022)     fluticasone (FLONASE) 50 MCG/ACT nasal spray Place 2 sprays into the nose daily. (Patient not taking: Reported on 03/28/2022)     guaiFENesin-dextromethorphan (ROBITUSSIN DM) 100-10 MG/5ML syrup Take 10 mLs by mouth every 4 (four) hours as needed for cough. (Patient not taking: Reported on 01/15/2022) 118 mL 0   Multiple Vitamins-Minerals (CENTRUM SILVER PO) Take 1 tablet by mouth. (Patient  not taking: Reported on 03/28/2022)     oxybutynin (DITROPAN) 5 MG tablet Take 5 mg by mouth 2 (two) times daily. (Patient not taking: Reported on 03/28/2022)     pantoprazole (PROTONIX) 40 MG tablet Take 40 mg by mouth daily. (Patient not taking: Reported on 05/12/2021)     pravastatin (PRAVACHOL) 10 MG tablet Take 10 mg by mouth at bedtime. (Patient not taking: Reported on 03/28/2022)     No facility-administered medications prior to visit.     Review of Systems:   Constitutional:   No  weight loss, night sweats,  Fevers, chills,  +fatigue, or  lassitude.  HEENT:   No headaches,  Difficulty swallowing,  Tooth/dental problems, or  Sore throat,                No sneezing, itching, ear ache, nasal congestion, post nasal drip,   CV:  No  chest pain,  Orthopnea, PND, , anasarca, dizziness, palpitations, syncope.   GI  No heartburn, indigestion, abdominal pain, nausea, vomiting, diarrhea, change in bowel habits, loss of appetite, bloody stools.   Resp: No shortness of breath with exertion or at rest.  No excess mucus, no productive cough,  No non-productive cough,  No coughing up of blood.  No change in color of mucus.  No wheezing.  No chest wall deformity  Skin: no rash or lesions.  GU: no dysuria, change in color of urine, no urgency or frequency.  No flank pain, no hematuria   MS:  No joint pain or swelling.  No decreased range of motion.  No back pain.    Physical Exam  BP (!) 140/84 (BP Location: Left Wrist, Cuff Size: Normal)   Pulse 90   Ht 5\' 4"  (1.626 m)   Wt 285 lb 12.8 oz (129.6 kg)   SpO2 99%   BMI 49.06 kg/m   GEN: A/Ox3; pleasant , NAD, well nourished    HEENT:  [place]/AT,  EACs-clear, TMs-wnl, NOSE-clear, THROAT-clear, no lesions, no postnasal drip or exudate noted. Class 4 MP airway   NECK:  Supple w/ fair ROM; no JVD; normal carotid impulses w/o bruits; no thyromegaly or nodules palpated; no lymphadenopathy.    RESP  Clear  P & A; w/o, wheezes/ rales/ or rhonchi. no accessory muscle use, no dullness to percussion  CARD:  RRR, no m/r/g, 2+ peripheral edema, pulses intact, no cyanosis or clubbing.  GI:   Soft & nt; nml bowel sounds; no organomegaly or masses detected.   Musco: Warm bil, no deformities or joint swelling noted.   Neuro: alert, no focal deficits noted.    Skin: Warm, no lesions or rashes    Lab Results:  CBC     ProBNP No results found for: "PROBNP"  Imaging: No results found.        No data to display          No results found for: "NITRICOXIDE"      Assessment & Plan:   OSA (obstructive sleep apnea) Mild obstructive sleep apnea.  We discussed her sleep study results in detail.  Patient would like to restart CPAP.  We will restart CPAP.  Will begin  CPAP AutoSet 5 to 15 cm H2O. - discussed how weight can impact sleep and risk for sleep disordered breathing - discussed options to assist with weight loss: combination of diet modification, cardiovascular and strength training exercises   - had an extensive discussion regarding the adverse health consequences related to untreated sleep disordered breathing -  specifically discussed the risks for hypertension, coronary artery disease, cardiac dysrhythmias, cerebrovascular disease, and diabetes - lifestyle modification discussed   - discussed how sleep disruption can increase risk of accidents, particularly when driving - safe driving practices were discussed   Plan  Patient Instructions  Begin CPAP At bedtime  , wear all night long  Work on healthy weight loss  Do not drive if sleepy  Follow up with Dr. Craige Cotta  in 3 months and As needed   '     Arlette Schaad, NP 03/28/2022

## 2022-03-28 NOTE — Assessment & Plan Note (Signed)
Mild obstructive sleep apnea.  We discussed her sleep study results in detail.  Patient would like to restart CPAP.  We will restart CPAP.  Will begin CPAP AutoSet 5 to 15 cm H2O. - discussed how weight can impact sleep and risk for sleep disordered breathing - discussed options to assist with weight loss: combination of diet modification, cardiovascular and strength training exercises   - had an extensive discussion regarding the adverse health consequences related to untreated sleep disordered breathing - specifically discussed the risks for hypertension, coronary artery disease, cardiac dysrhythmias, cerebrovascular disease, and diabetes - lifestyle modification discussed   - discussed how sleep disruption can increase risk of accidents, particularly when driving - safe driving practices were discussed   Plan  Patient Instructions  Begin CPAP At bedtime  , wear all night long  Work on healthy weight loss  Do not drive if sleepy  Follow up with Dr. Craige Cotta  in 3 months and As needed   '

## 2022-04-02 ENCOUNTER — Encounter (HOSPITAL_BASED_OUTPATIENT_CLINIC_OR_DEPARTMENT_OTHER): Payer: Medicare Other | Attending: Physician Assistant | Admitting: Internal Medicine

## 2022-04-02 DIAGNOSIS — E11622 Type 2 diabetes mellitus with other skin ulcer: Secondary | ICD-10-CM | POA: Insufficient documentation

## 2022-04-02 DIAGNOSIS — I509 Heart failure, unspecified: Secondary | ICD-10-CM | POA: Insufficient documentation

## 2022-04-02 DIAGNOSIS — G4733 Obstructive sleep apnea (adult) (pediatric): Secondary | ICD-10-CM | POA: Diagnosis not present

## 2022-04-02 DIAGNOSIS — L97928 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 with other specified severity: Secondary | ICD-10-CM | POA: Diagnosis not present

## 2022-04-02 DIAGNOSIS — I890 Lymphedema, not elsewhere classified: Secondary | ICD-10-CM | POA: Diagnosis not present

## 2022-04-02 DIAGNOSIS — I110 Hypertensive heart disease with heart failure: Secondary | ICD-10-CM | POA: Insufficient documentation

## 2022-04-02 NOTE — Progress Notes (Signed)
TEOFILA, BOWERY ([id number]) [id number] Page 1 of 9 Visit Report for 04/02/2022 Allergy List Details Patient Name: Date of Service: Elizabeth [name], Elizabeth [name] 04/02/2022 1:30 PM Medical Record Number: [id number] Patient Account Number: [id number] Date of Birth/Sex: Treating RN: 1940-12-03 (81 y.o. Fredderick Phenix Primary Care Miyuki Rzasa: Loura Back Other Clinician: Referring Layza Summa: Treating Nicoles Sedlacek/Extender: Durene Fruits in Treatment: 0 Allergies Active Allergies No Known Drug Allergies Allergy Notes Electronic Signature(s) Signed: 04/02/2022 3:54:33 PM By: Samuella Bruin Entered By: Samuella Bruin on 04/02/2022 13:46:35 -------------------------------------------------------------------------------- Arrival Information Details Patient Name: Date of Service: Elizabeth [name] 04/02/2022 1:30 PM Medical Record Number: [id number] Patient Account Number: [id number] Date of Birth/Sex: Treating RN: Jan 10, 1941 (81 y.o. Fredderick Phenix Primary Care Auriella Wieand: Loura Back Other Clinician: Referring Briggitte Boline: Treating Janayia Burggraf/Extender: Durene Fruits in Treatment: 0 Visit Information Patient Arrived: Cloyde Reams Time: 13:41 Accompanied By: daughter Transfer Assistance: None Patient Identification Verified: Yes Secondary Verification Process Completed: Yes Patient Has Alerts: Yes Patient Alerts: L ABI: [place] in clinic History Since Last Visit Added or deleted any medications: No Electronic Signature(s) Signed: 04/02/2022 3:54:33 PM By: Samuella Bruin Entered By: Samuella Bruin on 04/02/2022 14:09:50 -------------------------------------------------------------------------------- Clinic Level of Care Assessment Details Patient Name: Date of Service: Elizabeth [name], Elizabeth [name] 04/02/2022 1:30 PM Medical Record Number: [id number] Patient Account Number: [id number] ZORIYAH, SCHEIDEGGER ([id number])  [phone].pdf Page 2 of 9 Date of Birth/Sex: Treating RN: [birth year] (81 y.o. Fredderick Phenix Primary Care Sarahlynn Cisnero: Other Clinician: Loura Back Referring Addisson Frate: Treating Akeem Heppler/Extender: Durene Fruits in Treatment: 0 Clinic Level of Care Assessment Items TOOL 1 Quantity Score X- 1 0 Use when EandM and Procedure is performed on INITIAL visit ASSESSMENTS - Nursing Assessment / Reassessment X- 1 20 General Physical Exam (combine w/ comprehensive assessment (listed just below) when performed on new pt. evals) X- 1 25 Comprehensive Assessment (HX, ROS, Risk Assessments, Wounds Hx, etc.) ASSESSMENTS - Wound and Skin Assessment / Reassessment []  - 0 Dermatologic / Skin Assessment (not related to wound area) ASSESSMENTS - Ostomy and/or Continence Assessment and Care []  - 0 Incontinence Assessment and Management []  - 0 Ostomy Care Assessment and Management (repouching, etc.) PROCESS - Coordination of Care X - Simple Patient / Family Education for ongoing care 1 15 []  - 0 Complex (extensive) Patient / Family Education for ongoing care X- 1 10 Staff obtains , Records, T Results / Process Orders est []  - 0 Staff telephones HHA, Nursing Homes / Clarify orders / etc []  - 0 Routine Transfer to another Facility (non-emergent condition) []  - 0 Routine Hospital Admission (non-emergent condition) X- 1 15 New Admissions / / Ordering NPWT Apligraf, etc. , []  - 0 Emergency Hospital Admission (emergent condition) PROCESS - Special Needs []  - 0 Pediatric / Minor Patient Management []  - 0 Isolation Patient Management []  - 0 Hearing / Language / Visual special needs []  - 0 Assessment of Community assistance (transportation, D/C planning, etc.) []  - 0 Additional assistance / Altered mentation []  - 0 Support Surface(s) Assessment (bed, cushion, seat, etc.) INTERVENTIONS - Miscellaneous []  -  0 External ear exam []  - 0 Patient Transfer (multiple staff / / Similar devices) []  - 0 Simple Staple / Suture removal (25 or less) []  - 0 Complex Staple / Suture removal (26 or more) []  - 0 Hypo/Hyperglycemic Management (do not check if billed separately) X- 1 15 Ankle / Brachial Index (ABI) - do not check if billed separately Has the patient  been seen at the hospital within the last three years: Yes Total Score: 100 Level Of Care: New/Established - Level 3 Electronic Signature(s) Signed: 04/02/2022 3:54:33 PM By: Samuella Bruin Entered By: Samuella Bruin on 04/02/2022 14:30:19 Elizabeth [name] ([id number]) [id number] Page 3 of 9 -------------------------------------------------------------------------------- Compression Therapy Details Patient Name: Date of Service: Elizabeth [name], Elizabeth [name] 04/02/2022 1:30 PM Medical Record Number: [id number] Patient Account Number: [id number] Date of Birth/Sex: Treating RN: Feb 01, 1941 (81 y.o. Fredderick Phenix Primary Care Deldrick Linch: Loura Back Other Clinician: Referring Paxton Binns: Treating Evetta Renner/Extender: Durene Fruits in Treatment: 0 Compression Therapy Performed for Wound Assessment: Wound #3 Left,Anterior Lower Leg Performed By: Clinician Samuella Bruin, RN Compression Type: Three Layer Post Procedure Diagnosis Same as Pre-procedure Electronic Signature(s) Signed: 04/02/2022 3:54:33 PM By: Samuella Bruin Entered By: Samuella Bruin on 04/02/2022 14:27:33 -------------------------------------------------------------------------------- Encounter Discharge Information Details Patient Name: Date of Service: Elizabeth [name] 04/02/2022 1:30 PM Medical Record Number: [id number] Patient Account Number: [id number] Date of Birth/Sex: Treating RN: Sep 07, 1940 (81 y.o. Fredderick Phenix Primary Care Rockwell Zentz: Loura Back Other Clinician: Referring  Avonda Toso: Treating Ineta Sinning/Extender: Durene Fruits in Treatment: 0 Encounter Discharge Information Items Discharge Condition: Stable Ambulatory Status: Walker Discharge Destination: Home Transportation: Private Auto Accompanied By: daughter Schedule Follow-up Appointment: Yes Clinical Summary of Care: Patient Declined Electronic Signature(s) Signed: 04/02/2022 3:54:33 PM By: Gelene Mink By: Samuella Bruin on 04/02/2022 14:28:31 -------------------------------------------------------------------------------- Lower Extremity Assessment Details Patient Name: Date of Service: Elizabeth [name], Elizabeth [name] 04/02/2022 1:30 PM Medical Record Number: [id number] Patient Account Number: [id number] Date of Birth/Sex: Treating RN: 12-14-1940 (81 y.o. Fredderick Phenix Primary Care Wyonia Fontanella: Loura Back Other Clinician: Referring Khadeejah Castner: Treating Tyray Proch/Extender: Durene Fruits in Treatment: 0 Edema Assessment Assessed: Kyra Searles: No] Franne Forts: No] [Left: Edema] [Right: :] [address] [place], HUCKEBA ([id number]) [id number] Page 4 of 9 Left: Right: Point of Measurement: From Medial Instep 44 cm 48 cm Ankle Left: Right: Point of Measurement: From Medial Instep 32 cm 32.1 cm Knee To Floor Left: Right: From Medial Instep 42 cm 42 cm Vascular Assessment Pulses: Dorsalis Pedis Palpable: [Left:Yes] [Right:Yes] Notes L ABI: [place] Electronic Signature(s) Signed: 04/02/2022 3:54:33 PM By: Samuella Bruin Entered By: Samuella Bruin on 04/02/2022 14:09:02 -------------------------------------------------------------------------------- Multi Wound Chart Details Patient Name: Date of Service: Elizabeth [name] 04/02/2022 1:30 PM Medical Record Number: [id number] Patient Account Number: [id number] Date of Birth/Sex: Treating RN: 01/19/1941 (5 y.o. F) Primary Care Dortha Neighbors: Loura Back Other Clinician: Referring  Merrit Waugh: Treating Treshon Stannard/Extender: Durene Fruits in Treatment: 0 Vital Signs Height(in): 63 Pulse(bpm): 81 Weight(lbs): 285 Blood Pressure(mmHg): 155/85 Body Mass Index(BMI): 50.5 Temperature(F): 97.4 Respiratory Rate(breaths/min): 18 [3:Photos:] [N/A:N/A] Left, Anterior Lower Leg N/A N/A Wound Location: Blister N/A N/A Wounding Event: Lymphedema N/A N/A Primary Etiology: Anemia, Asthma, Sleep Apnea, N/A N/A Comorbid History: Congestive Heart Failure, Hypertension, Peripheral Venous Disease, Type II Diabetes, Osteoarthritis, Neuropathy 03/26/2022 N/A N/A Date Acquired: 0 N/A N/A Weeks of Treatment: Open N/A N/A Wound Status: No N/A N/A Wound Recurrence: 1.7x6x0.1 N/A N/A Measurements L x W x D (cm) 8.011 N/A N/A A (cm) : rea 0.801 N/A N/A Volume (cm) : Elizabeth [name], Elizabeth [name] ([id number]) [id number] Page 5 of 9 Full Thickness Without Exposed N/A N/A Classification: Support Structures Medium N/A N/A Exudate Amount: Serous N/A N/A Exudate Type: amber N/A N/A Exudate Color: Distinct, outline attached N/A N/A Wound Margin: Large (67-100%) N/A N/A Granulation Amount: Red N/A N/A Granulation Quality: Small (1-33%) N/A N/A Necrotic Amount: Fat Layer (Subcutaneous Tissue):  Yes N/A N/A Exposed Structures: Fascia: No Tendon: No Muscle: No Joint: No Bone: No None N/A N/A Epithelialization: No Abnormalities Noted N/A N/A Periwound Skin Texture: Dry/Scaly: Yes N/A N/A Periwound Skin Moisture: Hemosiderin Staining: Yes N/A N/A Periwound Skin Color: No Abnormality N/A N/A Temperature: Compression Therapy N/A N/A Procedures Performed: Treatment Notes Wound #3 (Lower Leg) Wound Laterality: Left, Anterior Cleanser Soap and Water Discharge Instruction: May shower and wash wound with dial antibacterial soap and water prior to dressing change. Wound Cleanser Discharge Instruction: Cleanse the wound with  wound cleanser prior to applying a clean dressing using gauze sponges, not tissue or cotton balls. Peri-Wound Care Sween Lotion (Moisturizing lotion) Discharge Instruction: Apply moisturizing lotion as directed Topical Primary Dressing Sorbalgon AG Dressing 2x2 (in/in) Discharge Instruction: Apply to wound bed as instructed Secondary Dressing ABD Pad, 5x9 Discharge Instruction: Apply over primary dressing as directed. Woven Gauze Sponge, Non-Sterile 4x4 in Discharge Instruction: Apply over primary dressing as directed. Secured With Transpore Surgical Tape, 2x10 (in/yd) Discharge Instruction: Secure dressing with tape as directed. Compression Wrap ThreePress (3 layer compression wrap) Discharge Instruction: Apply three layer compression as directed. Compression Stockings Add-Ons Electronic Signature(s) Signed: 04/02/2022 3:46:52 PM By: Baltazar Najjarobson, Michael MD Entered By: Baltazar Najjarobson, Michael on 04/02/2022 14:30:19 -------------------------------------------------------------------------------- Multi-Disciplinary Care Plan Details Patient Name: Date of Service: Elizabeth MoritaWILHITE, Elizabeth [name]. 04/02/2022 1:30 PM Medical Record Number: [id number] Patient Account Number: [id number] Elizabeth MoritaWILHITE, Elizabeth [name] ([id number]) [id number] Page 6 of 9 Date of Birth/Sex: Treating RN: 02/23/1941 (81 y.o. Fredderick PhenixF) Herrington, Taylor Primary Care Jaquavian Firkus: Other Clinician: Loura BackNguyen, Kim Referring Quanita Barona: Treating Suzan Manon/Extender: Durene Fruitsobson, Michael Combs, Allison Weeks in Treatment: 0 Active Inactive Abuse / Safety / Falls / Self Care Management Nursing Diagnoses: History of Falls Impaired physical mobility Potential for falls Goals: Patient will remain injury free related to falls Date Initiated: 04/02/2022 Target Resolution Date: 05/31/2022 Goal Status: Active Patient/caregiver will verbalize/demonstrate measure taken to improve self care Date Initiated: 04/02/2022 Target Resolution Date:  05/31/2022 Goal Status: Active Interventions: Assess fall risk on admission and as needed Assess personal safety and home safety (as indicated) on admission and as needed Notes: Wound/Skin Impairment Nursing Diagnoses: Impaired tissue integrity Knowledge deficit related to ulceration/compromised skin integrity Goals: Patient/caregiver will verbalize understanding of skin care regimen Date Initiated: 04/02/2022 Target Resolution Date: 06/01/2022 Goal Status: Active Interventions: Assess ulceration(s) every visit Treatment Activities: Skin care regimen initiated : 04/02/2022 Topical wound management initiated : 04/02/2022 Notes: Electronic Signature(s) Signed: 04/02/2022 3:54:33 PM By: Samuella BruinHerrington, Taylor Entered By: Samuella BruinHerrington, Taylor on 04/02/2022 14:18:08 -------------------------------------------------------------------------------- Pain Assessment Details Patient Name: Date of Service: Elizabeth MoritaWILHITE, Elizabeth [name]. 04/02/2022 1:30 PM Medical Record Number: [id number] Patient Account Number: [id number] Date of Birth/Sex: Treating RN: 04/23/1940 (81 y.o. Fredderick PhenixF) Herrington, Taylor Primary Care Gianni Fuchs: Loura BackNguyen, Kim Other Clinician: Referring Jasyn Mey: Treating Leafy Motsinger/Extender: Durene Fruitsobson, Michael Combs, Allison Weeks in Treatment: 0 Active Problems Location of Pain Severity and Description of Pain Patient Has Paino No Site Locations [place], Elizabeth [name] ([id number]) [id number] Page 7 of 9 Site Locations Rate the pain. Current Pain Level: 0 Pain Management and Medication Current Pain Management: Electronic Signature(s) Signed: 04/02/2022 3:54:33 PM By: Samuella BruinHerrington, Taylor Entered By: Samuella BruinHerrington, Taylor on 04/02/2022 14:01:01 -------------------------------------------------------------------------------- Patient/Caregiver Education Details Patient Name: Date of Service: Elizabeth MoritaWILHITE, Elizabeth [name]. 12/12/2023andnbsp1:30 PM Medical Record Number: [id number] Patient Account  Number: [id number] Date of Birth/Gender: Treating RN: 03/15/1941 (81 y.o. Fredderick PhenixF) Herrington, Taylor Primary Care Physician: Loura BackNguyen, Kim Other Clinician: Referring Physician: Treating Physician/Extender: Durene Fruitsobson, Michael Combs, Allison Weeks in Treatment: 0 Education Assessment Education Provided To: Patient  Education Topics Provided Wound/Skin Impairment: Methods: Explain/Verbal Responses: Reinforcements needed, State content correctly Electronic Signature(s) Signed: 04/02/2022 3:54:33 PM By: Samuella Bruin Entered By: Samuella Bruin on 04/02/2022 14:18:19 -------------------------------------------------------------------------------- Wound Assessment Details Patient Name: Date of Service: Elizabeth [name] 04/02/2022 1:30 PM Medical Record Number: [id number] Patient Account Number: [id number] Date of Birth/Sex: Treating RN: Mar 22, 1941 (81 y.o. Fredderick Phenix Primary Care Perri Aragones: Loura Back Other Clinician: Montez [name] ([id number]) [id number] Page 8 of 9 Referring Jalien Weakland: Treating Alston Berrie/Extender: Durene Fruits in Treatment: 0 Wound Status Wound Number: 3 Primary Lymphedema Etiology: Wound Location: Left, Anterior Lower Leg Wound Open Wounding Event: Blister Status: Date Acquired: 03/26/2022 Comorbid Anemia, Asthma, Sleep Apnea, Congestive Heart Failure, Weeks Of Treatment: 0 History: Hypertension, Peripheral Venous Disease, Type II Diabetes, Clustered Wound: No Osteoarthritis, Neuropathy Photos Wound Measurements Length: (cm) 1.7 Width: (cm) 6 Depth: (cm) 0.1 Area: (cm) 8.011 Volume: (cm) 0.801 % Reduction in Area: % Reduction in Volume: Epithelialization: None Tunneling: No Undermining: No Wound Description Classification: Full Thickness Without Exposed Support Structures Wound Margin: Distinct, outline attached Exudate Amount: Medium Exudate Type: Serous Exudate Color: amber Foul Odor  After Cleansing: No Slough/Fibrino Yes Wound Bed Granulation Amount: Large (67-100%) Exposed Structure Granulation Quality: Red Fascia Exposed: No Necrotic Amount: Small (1-33%) Fat Layer (Subcutaneous Tissue) Exposed: Yes Necrotic Quality: Adherent Slough Tendon Exposed: No Muscle Exposed: No Joint Exposed: No Bone Exposed: No Periwound Skin Texture Texture Color No Abnormalities Noted: Yes No Abnormalities Noted: No Hemosiderin Staining: Yes Moisture No Abnormalities Noted: No Temperature / Pain Dry / Scaly: Yes Temperature: No Abnormality Treatment Notes Wound #3 (Lower Leg) Wound Laterality: Left, Anterior Cleanser Soap and Water Discharge Instruction: May shower and wash wound with dial antibacterial soap and water prior to dressing change. Wound Cleanser Discharge Instruction: Cleanse the wound with wound cleanser prior to applying a clean dressing using gauze sponges, not tissue or cotton balls. Peri-Wound Care Sween Lotion (Moisturizing lotion) Discharge Instruction: Apply moisturizing lotion as directed Topical Primary Dressing Elizabeth [name], Elizabeth [name] ([id number]) [phone].pdf Page 9 of 9 Sorbalgon AG Dressing 2x2 (in/in) Discharge Instruction: Apply to wound bed as instructed Secondary Dressing ABD Pad, 5x9 Discharge Instruction: Apply over primary dressing as directed. Woven Gauze Sponge, Non-Sterile 4x4 in Discharge Instruction: Apply over primary dressing as directed. Secured With Transpore Surgical Tape, 2x10 (in/yd) Discharge Instruction: Secure dressing with tape as directed. Compression Wrap ThreePress (3 layer compression wrap) Discharge Instruction: Apply three layer compression as directed. Compression Stockings Add-Ons Electronic Signature(s) Signed: 04/02/2022 3:54:33 PM By: Samuella Bruin Entered By: Samuella Bruin on 04/02/2022  14:03:53 -------------------------------------------------------------------------------- Vitals Details Patient Name: Date of Service: Elizabeth [name] 04/02/2022 1:30 PM Medical Record Number: [id number] Patient Account Number: [id number] Date of Birth/Sex: Treating RN: 1940/06/20 (81 y.o. Fredderick Phenix Primary Care Temekia Caskey: Loura Back Other Clinician: Referring Shown Dissinger: Treating Jaretzi Droz/Extender: Durene Fruits in Treatment: 0 Vital Signs Time Taken: 13:45 Temperature (F): 97.4 Height (in): 63 Pulse (bpm): 81 Source: Stated Respiratory Rate (breaths/min): 18 Weight (lbs): 285 Blood Pressure (mmHg): 155/85 Source: Stated Reference Range: 80 - 120 mg / dl Body Mass Index (BMI): 50.5 Electronic Signature(s) Signed: 04/02/2022 3:54:33 PM By: Samuella Bruin Entered By: Samuella Bruin on 04/02/2022 13:46:28

## 2022-04-02 NOTE — Progress Notes (Signed)
Elizabeth, [name] ([id number]) [phone].pdf Page 1 of 4 Visit Report for 04/02/2022 Abuse Risk Screen Details Patient Name: Date of Service: Elizabeth [name], Elizabeth [name] 04/02/2022 1:30 PM Medical Record Number: [id number] Patient Account Number: [id number] Date of Birth/Sex: Treating RN: 1940/07/31 (81 y.o. Elizabeth [name] Primary Care Abdiel Blackerby: Loura Back Other Clinician: Referring Kenyana Husak: Treating Iris Tatsch/Extender: Durene Fruits in Treatment: 0 Abuse Risk Screen Items Answer ABUSE RISK SCREEN: Has anyone close to you tried to hurt or harm you recentlyo No Do you feel uncomfortable with anyone in your familyo No Has anyone forced you do things that you didnt want to doo No Electronic Signature(s) Signed: 04/02/2022 3:54:33 PM By: Samuella Bruin Entered By: Samuella Bruin on 04/02/2022 13:50:40 -------------------------------------------------------------------------------- Activities of Daily Living Details Patient Name: Date of Service: Elizabeth, [name] 04/02/2022 1:30 PM Medical Record Number: [id number] Patient Account Number: [id number] Date of Birth/Sex: Treating RN: 06-29-1940 (81 y.o. Elizabeth [name] Primary Care Damya Comley: Loura Back Other Clinician: Referring Stephie Xu: Treating Orah Sonnen/Extender: Durene Fruits in Treatment: 0 Activities of Daily Living Items Answer Activities of Daily Living (Please select one for each item) Drive Automobile Not Able T Medications ake Completely Able Use T elephone Completely Able Care for Appearance Completely Able Use T oilet Completely Able Bath / Shower Completely Able Dress Self Completely Able Feed Self Completely Able Walk Need Assistance Get In / Out Bed Completely Able Housework Need Assistance Prepare Meals Need Assistance Handle Money Need Assistance Shop for Self Need Assistance Electronic Signature(s) Signed: 04/02/2022  3:54:33 PM By: Samuella Bruin Entered By: Samuella Bruin on 04/02/2022 13:51:04 Elizabeth [name] ([id number]) [id number] Page 2 of 4 -------------------------------------------------------------------------------- Education Screening Details Patient Name: Date of Service: NATHAN, STALLWORTH 04/02/2022 1:30 PM Medical Record Number: [id number] Patient Account Number: [id number] Date of Birth/Sex: Treating RN: 1941-03-31 (81 y.o. Elizabeth [name] Primary Care Aarin Sparkman: Loura Back Other Clinician: Referring Mckayla Mulcahey: Treating Ronasia Isola/Extender: Durene Fruits in Treatment: 0 Primary Learner Assessed: Patient Learning Preferences/Education Level/Primary Language Learning Preference: Explanation, Demonstration, Video, Printed Material Highest Education Level: High School Preferred Language: English Cognitive Barrier Language Barrier: No Translator Needed: No Memory Deficit: No Emotional Barrier: No Cultural/Religious Beliefs Affecting Medical Care: No Physical Barrier Impaired Vision: Yes Glasses Impaired Hearing: No Decreased Hand dexterity: No Knowledge/Comprehension Knowledge Level: Medium Comprehension Level: Medium Ability to understand written instructions: Medium Ability to understand verbal instructions: Medium Motivation Anxiety Level: Calm Cooperation: Cooperative Education Importance: Acknowledges Need Interest in Health Problems: Asks Questions Perception: Coherent Willingness to Engage in Self-Management Medium Activities: Readiness to Engage in Self-Management Medium Activities: Electronic Signature(s) Signed: 04/02/2022 3:54:33 PM By: Samuella Bruin Entered By: Samuella Bruin on 04/02/2022 13:51:27 -------------------------------------------------------------------------------- Fall Risk Assessment Details Patient Name: Date of Service: Elizabeth [name] 04/02/2022 1:30 PM Medical  Record Number: [id number] Patient Account Number: [id number] Date of Birth/Sex: Treating RN: 1941-04-12 (81 y.o. Elizabeth [name] Primary Care Doria Fern: Loura Back Other Clinician: Referring Quintella Mura: Treating Aby Gessel/Extender: Durene Fruits in Treatment: 0 Fall Risk Assessment Items Have you had 2 or more falls in the last [address] VIVIANNA, [name] ([id number]) [phone] [id number] Page 3 of 4 Have you had any fall that resulted in injury in the last 12 monthso 0 No FALLS RISK SCREEN History of falling - immediate or within 3 months 25 Yes Secondary diagnosis (Do you have 2 or more medical diagnoseso) 15 Yes Ambulatory aid None/bed rest/wheelchair/nurse 0 No Crutches/cane/walker 15 Yes Furniture 0  No Intravenous therapy Access/Saline/Heparin Lock 0 No Gait/Transferring Normal/ bed rest/ wheelchair 0 No Weak (short steps with or without shuffle, stooped but able to lift head while walking, may seek 10 Yes support from furniture) Impaired (short steps with shuffle, may have difficulty arising from chair, head down, impaired 0 No balance) Mental Status Oriented to own ability 0 Yes Electronic Signature(s) Signed: 04/02/2022 3:54:33 PM By: Samuella Bruin Entered By: Samuella Bruin on 04/02/2022 13:52:01 -------------------------------------------------------------------------------- Foot Assessment Details Patient Name: Date of Service: Elizabeth [name] 04/02/2022 1:30 PM Medical Record Number: [id number] Patient Account Number: [id number] Date of Birth/Sex: Treating RN: July 16, 1940 (81 y.o. Elizabeth [name] Primary Care Rufus Cypert: Loura Back Other Clinician: Referring Ardine Iacovelli: Treating Dustie Brittle/Extender: Durene Fruits in Treatment: 0 Foot Assessment Items Site Locations + = Sensation present, - = Sensation absent, C = Callus, U = Ulcer R = Redness, W = Warmth, M = Maceration, PU =  Pre-ulcerative lesion F = Fissure, S = Swelling, D = Dryness Assessment Right: Left: Other Deformity: No No Prior Foot Ulcer: No No Prior Amputation: No No Charcot Joint: No No Ambulatory Status: Ambulatory With Help Assistance Device: QUANIYAH, BUGH ([id number]) [id number].pdf Page 4 of 4 Gait: Steady Electronic Signature(s) Signed: 04/02/2022 3:54:33 PM By: Samuella Bruin Entered By: Samuella Bruin on 04/02/2022 13:56:04 -------------------------------------------------------------------------------- Nutrition Risk Screening Details Patient Name: Date of Service: TAHJA, LIAO 04/02/2022 1:30 PM Medical Record Number: [id number] Patient Account Number: [id number] Date of Birth/Sex: Treating RN: November 14, 1940 (81 y.o. Elizabeth [name] Primary Care Duha Abair: Loura Back Other Clinician: Referring Milany Geck: Treating Tyronn Golda/Extender: Durene Fruits in Treatment: 0 Height (in): 63 Weight (lbs): 285 Body Mass Index (BMI): 50.5 Nutrition Risk Screening Items Score Screening NUTRITION RISK SCREEN: I have an illness or condition that made me change the kind and/or amount of food I eat 0 No I eat fewer than two meals per day 0 No I eat few fruits and vegetables, or milk products 0 No I have three or more drinks of beer, liquor or wine almost every day 0 No I have tooth or mouth problems that make it hard for me to eat 0 No I don't always have enough money to buy the food I need 0 No I eat alone most of the time 0 No I take three or more different prescribed or over-the-counter drugs a day 1 Yes Without wanting to, I have lost or gained 10 pounds in the last six months 0 No I am not always physically able to shop, cook and/or feed myself 0 No Nutrition Protocols Good Risk Protocol 0 No interventions needed Moderate Risk Protocol High Risk Proctocol Risk Level: Good Risk Score: 1 Electronic  Signature(s) Signed: 04/02/2022 3:54:33 PM By: Samuella Bruin Entered By: Samuella Bruin on 04/02/2022 13:52:36

## 2022-04-02 NOTE — Progress Notes (Signed)
Elizabeth [name], ESTABROOK ([id number]) [id number] Page 1 of 10 Visit Report for 04/02/2022 HPI Details Patient Name: Date of Service: Elizabeth, [name] 04/02/2022 1:30 PM Medical Record Number: [id number] Patient Account Number: [id number] Date of Birth/Sex: Treating RN: December 15, 1940 (81 y.o. F) Primary Care Provider: Loura Back Other Clinician: Referring Provider: Treating Provider/Extender: Durene Fruits in Treatment: 0 History of Present Illness Location: bilateral lower extremity swelling with ulceration Quality: Patient reports experiencing a dull pain to affected area(s). Severity: Patient states wound(s) are getting worse. Duration: Patient has had the wound for > 3 months prior to seeking treatment at the wound center Timing: Pain in wound is Intermittent (comes and goes Context: The wound would happen gradually Modifying Factors: Patient wound(s)/ulcer(s) are improving due to:as recently in the ER and she has been put on antibiotics and Lasix ssociated Signs and Symptoms: Patient reports having increase swelling. A HPI Description: 81 year old patient who has diabetes mellitus, hypertension, bronchitis, sleep apnea, morbid obesity was recently seen by her PCP and the ER and has been worked up for bilateral lower extremity lymphedema. The daughter and the patient says that she has had large legs for a long while and most recently she has been put on an antibiotics which was doxycycline and Lasix by her PCP and because of progressive problems she went to the ER recently on 11/19/2016. She was placed and Unna's boots and referred to the wound center. Past medical history significant for surgical removal of of her thyroid, abdominal hysterectomy. She does not smoke cigarettes. Recent x-ray of the chest showed cardiac enlargement with no evidence of active pulmonary disease. Her glucose was 120, BUN was 21 and creatinine is 1.06 and albumin was  3.3. We do not have any notes from her PCP 12/04/2016 -- she had a lower extremity arterial duplex evaluation which could not be done due to large weeping ulcers on bilateral lower legs. Peak systolic velocity and ratio in the right popliteal arteries consistent with a 50-74% stenosis. Her venous duplex study was done yesterday but the report is not in. 12/10/2016 -- Lower Extremity venous duplex reflux evaluation was done on 12/03/2016 and it showed reflux in bilateral common femoral veins and reflux in the right small saphenous vein midsegment and reflux in the left saphenofemoral junction with no reflux visualized in the great saphenous vein or small saphenous vein. It was technically difficult exam due to body habitus. With this result she warrants a vascular consultation regarding possible intervention 12/17/2016 -- lab work reviewed showed BNP, glucose was 111 and BUN was 14 and creatinine was 0.99. Liver function tests within normal limits. A1c as per the verbal report from the patient's daughter was 6.8% 01/28/2017 -- was seen by Dr. Leonides Sake on 01/17/2017. Noninvasive vascular imaging with bilateral lower extremity venous insufficiency duplex was reviewed and bilateral lower extremity arterial duplex was reviewed and his impression was that she had bilateral chronic venous insufficiency C5 and phlebolith lymphedema bilaterally. He recommended a repeat bilateral lower extremity ABI in 6 months after she has finished healing her venous ulcers. He suspected technical limitations with the prior right lower extremity arterial duplex. He recommended continuing compression, 30-40 mmHg along with lymph pumps. 02/05/2017 -- she is having a lot of problems with her insurance coverage both for her compression wraps and for the lymphedema pumps and my nursing staff is working on this. She sees her cardiologist this coming week and will clarify the issue regarding clearance to use the lymph  pumps.  02/19/2017 -- was seen by Dr. Yates Decamp, who worked her up appropriately and the patients resting EKG was normal and perfusion images demonstrated no evidence of ischemia and the left ventricle ejection fraction was 68% and this was a low risk study. He also noted on the arterial and venous lower extremity ultrasound that her arterial duplex done on 12/03/2016 showed biphasic PT on the right and biphasic except PT on the left. On the venous duplex done on 12/03/16 there was no GS reflux and there was a positive SSV reflux. 03/12/2017 -- her home health nurses have not been using the appropriate compression wraps but overall there has been good resolution in the size of the wound. The lymphedema still persists. She has been unable to obtain the lymphedema pumps yet due to insurance coverage and they may consider it after she finishes 6 months of therapy. 03/26/2017 -- she is close to healing and at this stage we are going to order some compression stockings about the 30-40 mm dual layer compression and the juxta lites. Her lymphedema pumps have not yet been authorized. 04/09/17-she is here in follow-up evaluation for bilateral lower extremity ulcers. She is anticipating to get lymphedema pumps within the next 6 weeks. She was unable to get compression stocking/wraps, insurance did not cover. She presents today with no open areas, no weeping, minimal edema. We will discharge from wound care center with application of 4-layer compression wrap for home health to remove on Friday READMISSION 04/02/2022 Elizabeth [name] is a lady with chronic lymphedema. Recently she saw her primary care doctor who prescribed her over the toe and heel stockings but she was not able to apply them. Also suggested as needed Lasix apparently at 20 mg. She did have a fall about a week ago but were not really sure what started this wound however it is on the left anterior lower leg. Note that the patient was here for a  similar issue in 2018. I have reviewed her past medical history she is a type II diabetic, lymphedema, obstructive sleep apnea, asthma, gastroesophageal reflux disease, hypertension, pancytopenia, CHF and chronic venous insufficiency ABI on the left was noncompressible Electronic Signature(s) ARTHEA, NOBEL ([id number]) [id number] Page 2 of 10 Signed: 04/02/2022 3:46:52 PM By: Baltazar Najjar MD Entered By: Baltazar Najjar on 04/02/2022 14:40:41 -------------------------------------------------------------------------------- Physical Exam Details Patient Name: Date of Service: Elizabeth [name] 04/02/2022 1:30 PM Medical Record Number: [id number] Patient Account Number: [id number] Date of Birth/Sex: Treating RN: [birth year] (81 y.o. F) Primary Care Provider: Loura Back Other Clinician: Referring Provider: Treating Provider/Extender: Durene Fruits in Treatment: 0 Constitutional Patient is hypertensive.. Pulse regular and within target range for patient.Marland Kitchen Respirations regular, non-labored and within target range.. Temperature is normal and within the target range for the patient.Marland Kitchen Appears in no distress. Cardiovascular Pedal pulses are palpable at the dorsalis pedis but not palpable at the posterior tibial where there is excessive amounts of edema. Tense nonpitting edema bilaterally. Integumentary (Hair, Skin) Skin changes compatible with chronic lymphedema. Notes Wound exam; superficial area on the left anterior lower leg. There is nothing about this that really looks particularly ominous there is no evidence of infection. I do not believe she has significant arterial insufficiency Electronic Signature(s) Signed: 04/02/2022 3:46:52 PM By: Baltazar Najjar MD Entered By: Baltazar Najjar on 04/02/2022 14:35:51 -------------------------------------------------------------------------------- Physician Orders Details Patient Name: Date of  Service: Elizabeth [name] 04/02/2022 1:30 PM Medical Record Number: [id number] Patient Account Number: [id number] Date of Birth/Sex: Treating RN: [birth year] (  81 y.o. Fredderick Phenix Primary Care Provider: Loura Back Other Clinician: Referring Provider: Treating Provider/Extender: Durene Fruits in Treatment: 0 Verbal / Phone Orders: No Diagnosis Coding Follow-up Appointments ppointment in 1 week. - Dr. Lady Gary - room 2 Return A Anesthetic (In clinic) Topical Lidocaine 4% applied to wound bed Bathing/ Shower/ Hygiene May shower with protection but do not get wound dressing(s) wet. - can purchase a cast protector from [facility] or [facility] Edema Control - Lymphedema / SCD / Other Elevate legs to the level of the heart or above for 30 minutes daily and/or when sitting, a frequency of: Avoid standing for long periods of time. Exercise regularly Moisturize legs daily. Wound Treatment Wound #3 - Lower Leg Wound Laterality: Left, Anterior SHAQUASHA, GERSTEL ([id number]) [id number] Page 3 of 10 Cleanser: Soap and Water 1 x Per Week/30 Days Discharge Instructions: May shower and wash wound with dial antibacterial soap and water prior to dressing change. Cleanser: Wound Cleanser 1 x Per Week/30 Days Discharge Instructions: Cleanse the wound with wound cleanser prior to applying a clean dressing using gauze sponges, not tissue or cotton balls. Peri-Wound Care: Sween Lotion (Moisturizing lotion) 1 x Per Week/30 Days Discharge Instructions: Apply moisturizing lotion as directed Prim Dressing: Sorbalgon AG Dressing 2x2 (in/in) 1 x Per Week/30 Days ary Discharge Instructions: Apply to wound bed as instructed Secondary Dressing: ABD Pad, 5x9 1 x Per Week/30 Days Discharge Instructions: Apply over primary dressing as directed. Secondary Dressing: Woven Gauze Sponge, Non-Sterile 4x4 in 1 x Per Week/30 Days Discharge Instructions: Apply over primary  dressing as directed. Secured With: Transpore Surgical Tape, 2x10 (in/yd) 1 x Per Week/30 Days Discharge Instructions: Secure dressing with tape as directed. Compression Wrap: ThreePress (3 layer compression wrap) 1 x Per Week/30 Days Discharge Instructions: Apply three layer compression as directed. Compression Stockings: Circaid Juxta Lite Compression Wrap (DME) Left Leg Compression Amount: 30-40 mmHG Right Leg Compression Amount: 30-40 mmHG Discharge Instructions: Apply Circaid Juxta Lite Compression Wrap daily as instructed. Apply first thing in the morning, remove at night before bed. Patient Medications llergies: No Known Drug Allergies A Notifications Medication Indication Start End 04/02/2022 lidocaine DOSE topical 4 % cream - cream topical Electronic Signature(s) Signed: 04/02/2022 3:46:52 PM By: Baltazar Najjar MD Signed: 04/02/2022 3:54:33 PM By: Samuella Bruin Entered By: Samuella Bruin on 04/02/2022 14:31:32 -------------------------------------------------------------------------------- Problem List Details Patient Name: Date of Service: Elizabeth [name] 04/02/2022 1:30 PM Medical Record Number: [id number] Patient Account Number: [id number] Date of Birth/Sex: Treating RN: 04/06/1941 (80 y.o. 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 with other 04/02/2022 No Yes specified severity I89.0 Lymphedema, not elsewhere classified 04/02/2022 No Yes VALLERY, MCDADE ([id number]) [id number] Page 4 of 10 E11.622 Type 2 diabetes mellitus with other skin ulcer 04/02/2022 No Yes Inactive Problems Resolved Problems Electronic Signature(s) Signed: 04/02/2022 3:46:52 PM By: Baltazar Najjar MD Entered By: Baltazar Najjar on 04/02/2022 14:22:39 -------------------------------------------------------------------------------- Progress Note Details Patient Name: Date of Service: Elizabeth [name] 04/02/2022 1:30 PM Medical Record Number: [id number] Patient Account Number: [id number] Date of Birth/Sex: Treating RN: 19-Jun-1940 (81 y.o. F) Primary Care Provider: Loura Back Other Clinician: Referring Provider: Treating Provider/Extender: Durene Fruits in Treatment: 0 Subjective History of Present Illness (HPI) The following HPI elements were documented for the patient's wound:  Location: bilateral lower extremity swelling with ulceration Quality: Patient reports experiencing a dull pain to affected area(s). Severity: Patient states wound(s) are getting worse. Duration: Patient has had the wound for > 3 months prior to seeking treatment at the wound center Timing: Pain in wound is Intermittent (comes and goes Context: The wound would happen gradually Modifying Factors: Patient wound(s)/ulcer(s) are improving due to:as recently in the ER and she has been put on antibiotics and Lasix Associated Signs and Symptoms: Patient reports having increase swelling. 81 year old patient who has diabetes mellitus, hypertension, bronchitis, sleep apnea, morbid obesity was recently seen by her PCP and the ER and has been worked up for bilateral lower extremity lymphedema. The daughter and the patient says that she has had large legs for a long while and most recently she has been put on an antibiotics which was doxycycline and Lasix by her PCP and because of progressive problems she went to the ER recently on 11/19/2016. She was placed and Unna's boots and referred to the wound center. Past medical history significant for surgical removal of of her thyroid, abdominal hysterectomy. She does not smoke cigarettes. Recent x-ray of the chest showed cardiac enlargement with no  evidence of active pulmonary disease. Her glucose was 120, BUN was 21 and creatinine is 1.06 and albumin was 3.3. We do not have any notes from her PCP 12/04/2016 -- she had a lower extremity arterial duplex evaluation which could not be done due to large weeping ulcers on bilateral lower legs. Peak systolic velocity and ratio in the right popliteal arteries consistent with a 50-74% stenosis. Her venous duplex study was done yesterday but the report is not in. 12/10/2016 -- Lower Extremity venous duplex reflux evaluation was done on 12/03/2016 and it showed reflux in bilateral common femoral veins and reflux in the right small saphenous vein midsegment and reflux in the left saphenofemoral junction with no reflux visualized in the great saphenous vein or small saphenous vein. It was technically difficult exam due to body habitus. With this result she warrants a vascular consultation regarding possible intervention 12/17/2016 -- lab work reviewed showed BNP, glucose was 111 and BUN was 14 and creatinine was 0.99. Liver function tests within normal limits. A1c as per the verbal report from the patient's daughter was 6.8% 01/28/2017 -- was seen by Dr. Leonides Sake on 01/17/2017. Noninvasive vascular imaging with bilateral lower extremity venous insufficiency duplex was reviewed and bilateral lower extremity arterial duplex was reviewed and his impression was that she had bilateral chronic venous insufficiency C5 and phlebolith lymphedema bilaterally. He recommended a repeat bilateral lower extremity ABI in 6 months after she has finished healing her venous ulcers. He suspected technical limitations with the prior right lower extremity arterial duplex. He recommended continuing compression, 30-40 mmHg along with lymph pumps. 02/05/2017 -- she is having a lot of problems with her insurance coverage both for her compression wraps and for the lymphedema pumps and my nursing staff is working on this. She sees  her cardiologist this coming week and will clarify the issue regarding clearance to use the lymph pumps. 02/19/2017 -- was seen by Dr. Yates Decamp, who worked her up appropriately and the patientoos resting EKG was normal and perfusion images demonstrated no evidence of ischemia and the left ventricle ejection fraction was 68% and this was a low risk study. He also noted on the arterial and venous lower extremity ultrasound that her arterial duplex done on 12/03/2016 showed biphasic PT on the right and biphasic  except PT on the left. On the venous duplex done on 12/03/16 there was no GS reflux and there was a positive SSV reflux. 03/12/2017 -- her home health nurses have not been using the appropriate compression wraps but overall there has been good resolution in the size of the wound. The lymphedema still persists. She has been unable to obtain the lymphedema pumps yet due to insurance coverage and they may consider it after she finishes 6 months of therapy. 03/26/2017 -- she is close to healing and at this stage we are going to order some compression stockings about the 30-40 mm dual layer compression and the juxta lites. Her lymphedema pumps have not yet been authorized. Elizabeth MoritaWILHITE, Orine H ([id number]) [id number] Page 5 of 10 04/09/17-she is here in follow-up evaluation for bilateral lower extremity ulcers. She is anticipating to get lymphedema pumps within the next 6 weeks. She was unable to get compression stocking/wraps, insurance did not cover. She presents today with no open areas, no weeping, minimal edema. We will discharge from wound care center with application of 4-layer compression wrap for home health to remove on Friday READMISSION 04/02/2022 Elizabeth [name] is a lady with chronic lymphedema. Recently she saw her primary care doctor who prescribed her over the toe and heel stockings but she was not able to apply them. Also suggested as needed Lasix apparently at  20 mg. She did have a fall about a week ago but were not really sure what started this wound however it is on the left anterior lower leg. I have reviewed her past medical history she is a type II diabetic, lymphedema, obstructive sleep apnea, asthma, gastroesophageal reflux disease, hypertension, pancytopenia, CHF and chronic venous insufficiency ABI on the left was noncompressible Patient History Information obtained from Patient. Allergies No Known Drug Allergies Family History Diabetes - Mother, Heart Disease - Mother, Hypertension - Mother, No family history of Cancer, Hereditary Spherocytosis, Kidney Disease, Lung Disease, Seizures, Stroke, Thyroid Problems, Tuberculosis. Social History Former smoker - Quit 01/17/1964, Marital Status - Widowed, Alcohol Use - Never, Drug Use - No History, Caffeine Use - Never. Medical History Eyes Denies history of Cataracts, Glaucoma, Optic Neuritis Ear/Nose/Mouth/Throat Denies history of Chronic sinus problems/congestion, Middle ear problems Hematologic/Lymphatic Patient has history of Anemia Denies history of Hemophilia, Human Immunodeficiency Virus, Lymphedema, Sickle Cell Disease Respiratory Patient has history of Asthma, Sleep Apnea Denies history of Aspiration, Chronic Obstructive Pulmonary Disease (COPD), Pneumothorax, Tuberculosis Cardiovascular Patient has history of Congestive Heart Failure, Hypertension, Peripheral Venous Disease Denies history of Angina, Arrhythmia, Coronary Artery Disease, Deep Vein Thrombosis, Hypotension, Myocardial Infarction, Peripheral Arterial Disease, Phlebitis, Vasculitis Gastrointestinal Denies history of Cirrhosis , Colitis, Crohnoos, Hepatitis A, Hepatitis B, Hepatitis C Endocrine Patient has history of Type II Diabetes Denies history of Type I Diabetes Genitourinary Denies history of End Stage Renal Disease Immunological Denies history of Lupus Erythematosus, Raynaudoos, Scleroderma Integumentary  (Skin) Denies history of History of Burn Musculoskeletal Patient has history of Osteoarthritis Denies history of Gout, Rheumatoid Arthritis, Osteomyelitis Neurologic Patient has history of Neuropathy Denies history of Dementia, Quadriplegia, Paraplegia, Seizure Disorder Oncologic Denies history of Received Chemotherapy, Received Radiation Psychiatric Denies history of Anorexia/bulimia, Confinement Anxiety Hospitalization/Surgery History - Abdominal Hysterectomy. - Cystectomy. - Thyroid Surgery. Medical A Surgical History Notes nd Constitutional Symptoms (General Health) obesity Respiratory Bronchitis Wheezing Gastrointestinal Constipation Genitourinary stage III kidney disease Musculoskeletal Unspecified Arthritis Oncologic R Breast Mass Review of Systems (ROS) Eyes Complains or has symptoms of Glasses / Contacts - glasses. Ear/Nose/Mouth/Throat Denies complaints or  symptoms of Chronic sinus problems or rhinitis. Gastrointestinal Denies complaints or symptoms of Frequent diarrhea, Nausea, Vomiting. [address] ELIABETH, SHOFF ([id number]) [id number] Page 6 of 10 Denies complaints or symptoms of Frequent urination. Musculoskeletal Complains or has symptoms of Muscle Weakness. Psychiatric Denies complaints or symptoms of Claustrophobia. Objective Constitutional Patient is hypertensive.. Pulse regular and within target range for patient.Marland Kitchen Respirations regular, non-labored and within target range.. Temperature is normal and within the target range for the patient.Marland Kitchen Appears in no distress. Vitals Time Taken: 1:45 PM, Height: 63 in, Source: Stated, Weight: 285 lbs, Source: Stated, BMI: 50.5, Temperature: 97.4 F, Pulse: 81 bpm, Respiratory Rate: 18 breaths/min, Blood Pressure: 155/85 mmHg. Cardiovascular Pedal pulses are palpable at the dorsalis pedis but not palpable at the posterior tibial where there is excessive amounts of edema. Tense  nonpitting edema bilaterally. General Notes: Wound exam; superficial area on the left anterior lower leg. There is nothing about this that really looks particularly ominous there is no evidence of infection. I do not believe she has significant arterial insufficiency Integumentary (Hair, Skin) Skin changes compatible with chronic lymphedema. Wound #3 status is Open. Original cause of wound was Blister. The date acquired was: 03/26/2022. The wound is located on the Left,Anterior Lower Leg. The wound measures 1.7cm length x 6cm width x 0.1cm depth; 8.011cm^2 area and 0.801cm^3 volume. There is Fat Layer (Subcutaneous Tissue) exposed. There is no tunneling or undermining noted. There is a medium amount of serous drainage noted. The wound margin is distinct with the outline attached to the wound base. There is large (67-100%) red granulation within the wound bed. There is a small (1-33%) amount of necrotic tissue within the wound bed including Adherent Slough. The periwound skin appearance had no abnormalities noted for texture. The periwound skin appearance exhibited: Dry/Scaly, Hemosiderin Staining. Periwound temperature was noted as No Abnormality. 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 There was a Three Layer Compression Therapy Procedure by Samuella Bruin, RN. Post procedure Diagnosis Wound #3: Same as Pre-Procedure Plan Follow-up Appointments: Return Appointment in 1 week. - Dr. Lady Gary - room 2 Anesthetic: (In clinic) Topical Lidocaine 4% applied to wound bed Bathing/ Shower/ Hygiene: May shower with protection but do not get wound dressing(s) wet. - can purchase a cast protector from [facility] or  [facility] Edema Control - Lymphedema / SCD / Other: Elevate legs to the level of the heart or above for 30 minutes daily and/or when sitting, a frequency of: Avoid standing for long periods of time. Exercise regularly Moisturize legs daily. The following medication(s) was prescribed: lidocaine topical 4 % cream cream topical was prescribed at facility WOUND #3: - Lower Leg Wound Laterality: Left, Anterior LUCINDA, SPELLS ([id number]) [id number] Page 7 of 10 Cleanser: Soap and Water 1 x Per Week/30 Days Discharge Instructions: May shower and wash wound with dial antibacterial soap and water prior to dressing change. Cleanser: Wound Cleanser 1 x Per Week/30 Days Discharge Instructions: Cleanse the wound with wound cleanser prior to applying a clean dressing using gauze sponges, not tissue or cotton balls. Peri-Wound Care: Sween Lotion (Moisturizing lotion) 1 x Per Week/30 Days Discharge Instructions: Apply moisturizing lotion as directed Prim Dressing: [employer] Dressing 2x2 (in/in) 1 x Per Week/30 Days ary Discharge Instructions: Apply to wound  bed as instructed Secondary Dressing: ABD Pad, 5x9 1 x Per Week/30 Days Discharge Instructions: Apply over primary dressing as directed. Secondary Dressing: Woven Gauze Sponge, Non-Sterile 4x4 in 1 x Per Week/30 Days Discharge Instructions: Apply over primary dressing as directed. Secured With: Transpore Surgical T ape, 2x10 (in/yd) 1 x Per Week/30 Days Discharge Instructions: Secure dressing with tape as directed. Com pression Wrap: ThreePress (3 layer compression wrap) 1 x Per Week/30 Days Discharge Instructions: Apply three layer compression as directed. Com pression Stockings: Circaid Juxta Lite Compression Wrap (DME) Compression Amount: 30-40 mmHg (left) Compression Amount: 30-40 mmHg (right) Discharge Instructions: Apply Circaid Juxta Lite Compression Wrap daily as instructed. Apply first thing in the morning,  remove at night before bed. 1. Wound on the left anterior lower extremity in the setting of very severe grade 3 lymphedema. Were not sure if this was spontaneous or secondary to trauma. Fortunately there is no evidence of infection. Primary physician gave them regular Lasix 20 mg which I am not sure would be helpful in a stocking however they are not able to get the stocking on. Patient lives with a son. 2. She came to clinic today with her daughter I think she would be available to help at home if need be with stockings. 3. We talked about external compression stockings/juxta lites including the ease of application comfort and cost. They seem eager to try these oncethis wound heals #4 no evidence of CHF at the bedside although this would be a difficult clinical determination 5. We applied silver alginate ABDs under 3 layer compression although truthfully I think she could tolerate 4-layer Electronic Signature(s) Signed: 04/02/2022 3:46:52 PM By: Baltazar Najjar MD Entered By: Baltazar Najjar on 04/02/2022 14:39:33 -------------------------------------------------------------------------------- HxROS Details Patient Name: Date of Service: Elizabeth [name] 04/02/2022 1:30 PM Medical Record Number: [id number] Patient Account Number: [id number] Date of Birth/Sex: Treating RN: 1941-03-06 (81 y.o. Fredderick Phenix Primary Care Provider: Loura Back Other Clinician: Referring Provider: Treating Provider/Extender: Durene Fruits in Treatment: 0 Information Obtained From Patient Eyes Complaints and Symptoms: Positive for: Glasses / Contacts - glasses Medical History: Negative for: Cataracts; Glaucoma; Optic Neuritis Ear/Nose/Mouth/Throat Complaints and Symptoms: Negative for: Chronic sinus problems or rhinitis Medical History: Negative for: Chronic sinus problems/congestion; Middle ear problems Gastrointestinal Complaints and Symptoms: Negative for: Frequent  diarrhea; Nausea; Vomiting Medical History: Negative for: Cirrhosis ; Colitis; Crohns; Hepatitis A; Hepatitis B; Hepatitis C Past Medical History NotesSADYE, KIERNAN ([id number]) [id number] Page 8 of 10 Constipation Genitourinary Complaints and Symptoms: Negative for: Frequent urination Medical History: Negative for: End Stage Renal Disease Past Medical History Notes: stage III kidney disease Musculoskeletal Complaints and Symptoms: Positive for: Muscle Weakness Medical History: Positive for: Osteoarthritis Negative for: Gout; Rheumatoid Arthritis; Osteomyelitis Past Medical History Notes: Unspecified Arthritis Psychiatric Complaints and Symptoms: Negative for: Claustrophobia Medical History: Negative for: Anorexia/bulimia; Confinement Anxiety Constitutional Symptoms (General Health) Medical History: Past Medical History Notes: obesity Hematologic/Lymphatic Medical History: Positive for: Anemia Negative for: Hemophilia; Human Immunodeficiency Virus; Lymphedema; Sickle Cell Disease Respiratory Medical History: Positive for: Asthma; Sleep Apnea Negative for: Aspiration; Chronic Obstructive Pulmonary Disease (COPD); Pneumothorax; Tuberculosis Past Medical History Notes: Bronchitis Wheezing Cardiovascular Medical History: Positive for: Congestive Heart Failure; Hypertension; Peripheral Venous Disease Negative for: Angina; Arrhythmia; Coronary Artery Disease; Deep Vein Thrombosis; Hypotension; Myocardial Infarction; Peripheral Arterial Disease; Phlebitis; Vasculitis Endocrine Medical History: Positive for: Type II Diabetes Negative for: Type I Diabetes Time with diabetes: 15 years Treated with: Oral agents Blood sugar tested every  day: Yes Tested : 2x day Immunological Medical History: Negative for: Lupus Erythematosus; Raynauds; Scleroderma Integumentary (Skin) Medical History: Negative for: History of Burn Neurologic Medical  HistoryMarland Kitchen SHERICKA, JOHNSTONE ([id number]) [id number] Page 9 of 10 Positive for: Neuropathy Negative for: Dementia; Quadriplegia; Paraplegia; Seizure Disorder Oncologic Medical History: Negative for: Received Chemotherapy; Received Radiation Past Medical History Notes: R Breast Mass Immunizations Pneumococcal Vaccine: Received Pneumococcal Vaccination: Yes Received Pneumococcal Vaccination On or After 60th Birthday: Yes Implantable Devices No devices added Hospitalization / Surgery History Type of Hospitalization/Surgery Abdominal Hysterectomy Cystectomy Thyroid Surgery Family and Social History Cancer: No; Diabetes: Yes - Mother; Heart Disease: Yes - Mother; Hereditary Spherocytosis: No; Hypertension: Yes - Mother; Kidney Disease: No; Lung Disease: No; Seizures: No; Stroke: No; Thyroid Problems: No; Tuberculosis: No; Former smoker - Quit 01/17/1964; Marital Status - Widowed; Alcohol Use: Never; Drug Use: No History; Caffeine Use: Never; Financial Concerns: No; Food, Clothing or Shelter Needs: No; Support System Lacking: No; Transportation Concerns: No Electronic Signature(s) Signed: 04/02/2022 3:46:52 PM By: Baltazar Najjar MD Signed: 04/02/2022 3:54:33 PM By: Samuella Bruin Entered By: Samuella Bruin on 04/02/2022 13:50:34 -------------------------------------------------------------------------------- SuperBill Details Patient Name: Date of Service: Elizabeth [name] 04/02/2022 Medical Record Number: [id number] Patient Account Number: [id number] Date of Birth/Sex: Treating RN: February 19, 1941 (81 y.o. 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 I89.0 Lymphedema, not  elsewhere classified E11.622 Type 2 diabetes mellitus with other skin ulcer Facility Procedures : CPT4 Code: 54008676 Description: 99213 - WOUND CARE VISIT-LEV 3 EST PT Modifier: 25 Quantity: 1 : CPT4 Code: 19509326 Description: (Facility Use Only) 29581LT - APPLY MULTLAY COMPRS LWR LT LEG ICD-10 Diagnosis Description L97.928 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 with other specified Modifier: severity Quantity: 1 Physician Procedures : CPT4 Code Description Modifier [id number] [facility] PHYS LEVEL 3 NEW PT KAITHLYN, TEAGLE ([id number]) [id number] ICD-10 Diagnosis Description L97.928 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

## 2022-04-04 ENCOUNTER — Telehealth: Payer: Self-pay | Admitting: Adult Health

## 2022-04-04 NOTE — Telephone Encounter (Signed)
I am sorry I went through her office notes I did not have down that she had oxygen with her CPAP.  When I saw her on March 28, 2022 ordered a new CPAP machine has she received information about this new machine.  Can we ask her when she wears the oxygen and who previously prescribed it

## 2022-04-04 NOTE — Telephone Encounter (Signed)
Called Elizabeth [name] the patients daughter and she states that her mother is needing a new oxygen concentrator for her moms cpap machine. I advised daughter that I will need to get the ok from tammy and I will get it sent in when I hear back  Tammy are you ok for me to send in new o2 concentrator for patient.   Please advise

## 2022-04-04 NOTE — Telephone Encounter (Signed)
PT daughter called. Wanted me to let Tammy now her mom needs an O2 concentrator. You can call her daughter @ [phone]. TY.

## 2022-04-05 NOTE — Telephone Encounter (Signed)
Called and spoke with the pt's daughter, Elenora Fender  She states that the pt had sleep study years ago per PCP Dr Candis Musa and this is when CPAP and o2 were both started  She states about a year ago she stopped using the o2 bc the unit was so old  She has not received new CPAP yet

## 2022-04-05 NOTE — Telephone Encounter (Signed)
She will need to start her CPAP, once she is on her CPAP and wearing it consistently we can check her oxygen level while wearing CPAP and see if she still needs oxygen

## 2022-04-09 NOTE — Telephone Encounter (Signed)
Called and spoke with Deidra (DPR).  Tammy,NP recommendations given.  Understanding stated.  Nothing further at this time.

## 2022-04-10 ENCOUNTER — Encounter (HOSPITAL_BASED_OUTPATIENT_CLINIC_OR_DEPARTMENT_OTHER): Payer: Medicare Other | Admitting: General Surgery

## 2022-04-10 DIAGNOSIS — E11622 Type 2 diabetes mellitus with other skin ulcer: Secondary | ICD-10-CM | POA: Diagnosis not present

## 2022-04-10 NOTE — Progress Notes (Signed)
Elizabeth, [name] ([id number]) [id number] Page 1 of 11 Visit Report for 04/10/2022 Chief Complaint Document Details Patient Name: Date of Service: Elizabeth [name], Elizabeth [name] 04/10/2022 11:30 A M Medical Record Number: [id number] Patient Account Number: [id number] Date of Birth/Sex: Treating RN: April 11, 1941 (81 y.o. F) Primary Care Provider: Loura Back Other Clinician: Referring Provider: Treating Provider/Extender: Rowe Pavy in Treatment: 1 Information Obtained from: Patient Chief Complaint she is here in follow-up evaluation for bilateral lower extremity ulcers Electronic Signature(s) Signed: 04/10/2022 12:00:37 PM By: Duanne Guess MD FACS Entered By: Duanne Guess on 04/10/2022 12:00:37 -------------------------------------------------------------------------------- Debridement Details Patient Name: Date of Service: Elizabeth [name] 04/10/2022 11:30 A M Medical Record Number: [id number] Patient Account Number: [id number] Date of Birth/Sex: Treating RN: 1941-02-18 (81 y.o. Fredderick Phenix Primary Care Provider: Loura Back Other Clinician: Referring Provider: Treating Provider/Extender: Rowe Pavy in Treatment: 1 Debridement Performed for Assessment: Wound #3 Left,Anterior Lower Leg Performed By: Physician Duanne Guess, MD Debridement Type: Debridement Level of Consciousness (Pre-procedure): Awake and Alert Pre-procedure Verification/Time Out Yes - 11:47 Taken: Start Time: 11:47 Pain Control: Lidocaine 4% T opical Solution T Area Debrided (L x W): otal 1.6 (cm) x 4.1 (cm) = 6.56 (cm) Tissue and other material debrided: Non-Viable, Slough, Slough Level: Non-Viable Tissue Debridement Description: Selective/Open Wound Instrument: Curette Bleeding: Minimum Hemostasis Achieved: Pressure Response to Treatment: Procedure was tolerated well Level of Consciousness (Post- Awake and  Alert procedure): Post Debridement Measurements of Total Wound Length: (cm) 1.6 Width: (cm) 4.1 Depth: (cm) 0.1 Volume: (cm) 0.515 Character of Wound/Ulcer Post Debridement: Improved Post Procedure Diagnosis Same as Pre-procedure Elizabeth [name], Elizabeth [name] ([id number]) [phone].pdf Page 2 of 11 Notes scribed for Dr. Lady Gary by Samuella Bruin, RN Electronic Signature(s) Signed: 04/10/2022 4:38:38 PM By: Samuella Bruin Signed: 04/10/2022 4:53:29 PM By: Duanne Guess MD FACS Entered By: Samuella Bruin on 04/10/2022 12:38:25 -------------------------------------------------------------------------------- HPI Details Patient Name: Date of Service: Elizabeth [name] 04/10/2022 11:30 A M Medical Record Number: [id number] Patient Account Number: [id number] Date of Birth/Sex: Treating RN: March 01, 1941 (81 y.o. F) Primary Care Provider: Loura Back Other Clinician: Referring Provider: Treating Provider/Extender: Rowe Pavy in Treatment: 1 History of Present Illness Location: bilateral lower extremity swelling with ulceration Quality: Patient reports experiencing a dull pain to affected area(s). Severity: Patient states wound(s) are getting worse. Duration: Patient has had the wound for > 3 months prior to seeking treatment at the wound center Timing: Pain in wound is Intermittent (comes and goes Context: The wound would happen gradually Modifying Factors: Patient wound(s)/ulcer(s) are improving due to:as recently in the ER and she has been put on antibiotics and Lasix ssociated Signs and Symptoms: Patient reports having increase swelling. A HPI Description: 81 year old patient who has diabetes mellitus, hypertension, bronchitis, sleep apnea, morbid obesity was recently seen by her PCP and the ER and has been worked up for bilateral lower extremity lymphedema. The daughter and the patient says that she has had large legs for a long while  and most recently she has been put on an antibiotics which was doxycycline and Lasix by her PCP and because of progressive problems she went to the ER recently on 11/19/2016. She was placed and Unna's boots and referred to the wound center. Past medical history significant for surgical removal of of her thyroid, abdominal hysterectomy. She does not smoke cigarettes. Recent x-ray of the chest showed cardiac enlargement with no evidence of active pulmonary disease. Her glucose was 120, BUN was 21 and creatinine is  1.06 and albumin was 3.3. We do not have any notes from her PCP 12/04/2016 -- she had a lower extremity arterial duplex evaluation which could not be done due to large weeping ulcers on bilateral lower legs. Peak systolic velocity and ratio in the right popliteal arteries consistent with a 50-74% stenosis. Her venous duplex study was done yesterday but the report is not in. 12/10/2016 -- Lower Extremity venous duplex reflux evaluation was done on 12/03/2016 and it showed reflux in bilateral common femoral veins and reflux in the right small saphenous vein midsegment and reflux in the left saphenofemoral junction with no reflux visualized in the great saphenous vein or small saphenous vein. It was technically difficult exam due to body habitus. With this result she warrants a vascular consultation regarding possible intervention 12/17/2016 -- lab work reviewed showed BNP, glucose was 111 and BUN was 14 and creatinine was 0.99. Liver function tests within normal limits. A1c as per the verbal report from the patient's daughter was 6.8% 01/28/2017 -- was seen by Dr. Leonides Sake on 01/17/2017. Noninvasive vascular imaging with bilateral lower extremity venous insufficiency duplex was reviewed and bilateral lower extremity arterial duplex was reviewed and his impression was that she had bilateral chronic venous insufficiency C5 and phlebolith lymphedema bilaterally. He recommended a repeat  bilateral lower extremity ABI in 6 months after she has finished healing her venous ulcers. He suspected technical limitations with the prior right lower extremity arterial duplex. He recommended continuing compression, 30-40 mmHg along with lymph pumps. 02/05/2017 -- she is having a lot of problems with her insurance coverage both for her compression wraps and for the lymphedema pumps and my nursing staff is working on this. She sees her cardiologist this coming week and will clarify the issue regarding clearance to use the lymph pumps. 02/19/2017 -- was seen by Dr. Yates Decamp, who worked her up appropriately and the patients resting EKG was normal and perfusion images demonstrated no evidence of ischemia and the left ventricle ejection fraction was 68% and this was a low risk study. He also noted on the arterial and venous lower extremity ultrasound that her arterial duplex done on 12/03/2016 showed biphasic PT on the right and biphasic except PT on the left. On the venous duplex done on 12/03/16 there was no GS reflux and there was a positive SSV reflux. 03/12/2017 -- her home health nurses have not been using the appropriate compression wraps but overall there has been good resolution in the size of the wound. The lymphedema still persists. She has been unable to obtain the lymphedema pumps yet due to insurance coverage and they may consider it after she finishes 6 months of therapy. 03/26/2017 -- she is close to healing and at this stage we are going to order some compression stockings about the 30-40 mm dual layer compression and the juxta lites. Her lymphedema pumps have not yet been authorized. 04/09/17-she is here in follow-up evaluation for bilateral lower extremity ulcers. She is anticipating to get lymphedema pumps within the next 6 weeks. She was unable to get compression stocking/wraps, insurance did not cover. She presents today with no open areas, no weeping, minimal edema. We will  discharge from wound care center with application of 4-layer compression wrap for home health to remove on Friday READMISSION 04/02/2022 Elizabeth [name] is a lady with chronic lymphedema. Recently she saw her primary care doctor who prescribed her over the toe and heel stockings but she was not able to apply them. Also suggested  as needed Lasix apparently at 20 mg. She did have a fall about a week ago but were not really sure what started this wound however it is on the left anterior lower leg. Note that the patient was here for a similar issue in 2018. Elizabeth MoritaWILHITE, Quynh H ([id number]) [id number] Page 3 of 11 I have reviewed her past medical history she is a type II diabetic, lymphedema, obstructive sleep apnea, asthma, gastroesophageal reflux disease, hypertension, pancytopenia, CHF and chronic venous insufficiency ABI on the left was noncompressible 04/10/2022: She removed her compression wrap and replaced it with an Ace bandage due to discomfort. She did leave her silver alginate and ABD in place. The wound measured smaller today. There is some slough on the surface. Edema control is marginal. Electronic Signature(s) Signed: 04/10/2022 12:01:42 PM By: Duanne Guessannon, Kymberlee Viger MD FACS Entered By: Duanne Guessannon, Jenai Scaletta on 04/10/2022 12:01:42 -------------------------------------------------------------------------------- Physical Exam Details Patient Name: Date of Service: Elizabeth MoritaWILHITE, Elizabeth H. 04/10/2022 11:30 A M Medical Record Number: [id number] Patient Account Number: [id number] Date of Birth/Sex: Treating RN: 04/14/1941 (81 y.o. F) Primary Care Provider: Loura BackNguyen, Kim Other Clinician: Referring Provider: Treating Provider/Extender: Rowe Pavyannon, Nicholle Falzon Nguyen, Kim Weeks in Treatment: 1 Constitutional She is hypertensive, but asymptomatic.. . . . No acute distress. Respiratory Normal work of breathing on room air. Notes 04/10/2022: The wound measured smaller today. There is some  slough on the surface. Edema control is marginal. Electronic Signature(s) Signed: 04/10/2022 12:02:25 PM By: Duanne Guessannon, Merranda Bolls MD FACS Entered By: Duanne Guessannon, Taheem Fricke on 04/10/2022 12:02:24 -------------------------------------------------------------------------------- Physician Orders Details Patient Name: Date of Service: Elizabeth MoritaWILHITE, Nelle H. 04/10/2022 11:30 A M Medical Record Number: [id number] Patient Account Number: [id number] Date of Birth/Sex: Treating RN: 06/12/1940 (81 y.o. 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 - Dr. Lady Garyannon - room 2 Return A Anesthetic (In clinic) Topical Lidocaine 4% applied to wound bed Devonne DoughtyWILHITE, Tomeika H ([id number]) [id number] Page 4 of 11 Bathing/ Shower/ Hygiene May shower with protection but do not get wound dressing(s) wet. - can purchase a cast protector from [facility] or [facility] Edema Control - Lymphedema / SCD / Other Elevate legs to the level of the heart or above for 30 minutes daily and/or when sitting, a frequency of: Avoid standing for long periods of time. Exercise regularly Moisturize legs daily. Wound Treatment Wound #3 - Lower Leg Wound Laterality: Left, Anterior Cleanser: Soap and Water 1 x Per Week/30 Days Discharge Instructions: May shower and wash wound with dial antibacterial soap and water prior to dressing change. Cleanser: Wound Cleanser 1 x Per Week/30 Days Discharge Instructions: Cleanse the wound with wound cleanser prior to applying a clean dressing using  gauze sponges, not tissue or cotton balls. Peri-Wound Care: Sween Lotion (Moisturizing lotion) 1 x Per Week/30 Days Discharge Instructions: Apply moisturizing lotion as directed Prim Dressing: Sorbalgon AG Dressing 2x2 (in/in) 1 x Per Week/30 Days ary Discharge Instructions: Apply to wound bed as instructed Secondary Dressing: ABD Pad, 5x9 1 x Per Week/30 Days Discharge Instructions: Apply over primary dressing as directed. Secondary Dressing: Woven Gauze Sponge, Non-Sterile 4x4 in 1 x Per Week/30 Days Discharge Instructions: Apply over primary dressing as directed. Secured With: [employer] Tape,  2x10 (in/yd) 1 x Per Week/30 Days Discharge Instructions: Secure dressing with tape as directed. Compression Wrap: Kerlix Roll 4.5x3.1 (in/yd) 1 x Per Week/30 Days Discharge Instructions: Apply Kerlix and Coban compression as directed. Compression Wrap: Coban Self-Adherent Wrap 4x5 (in/yd) 1 x Per Week/30 Days Discharge Instructions: Apply over Kerlix as directed. Compression Stockings: Circaid Juxta Lite Compression Wrap Left Leg Compression Amount: 30-40 mmHG Right Leg Compression Amount: 30-40 mmHG Discharge Instructions: Apply Circaid Juxta Lite Compression Wrap daily as instructed. Apply first thing in the morning, remove at night before bed. Patient Medications llergies: No Known Drug Allergies A Notifications Medication Indication Start End 04/10/2022 lidocaine DOSE topical 4 % cream - cream topical Electronic Signature(s) Signed: 04/10/2022 12:23:18 PM By: Duanne Guess MD FACS Entered By: Duanne Guess on 04/10/2022 12:02:38 -------------------------------------------------------------------------------- Problem List Details Patient Name: Date of Service: Elizabeth [name] 04/10/2022 11:30 A M Medical Record Number: [id number] Patient Account Number: [id number] Date of Birth/Sex: Treating RN: Aug 18, 1940 (81 y.o. 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 with other 04/02/2022 No Yes specified severity I89.0 Lymphedema, not elsewhere classified 04/02/2022 No Yes E11.622 Type 2 diabetes mellitus with other skin ulcer 04/02/2022 No Yes Inactive Problems Resolved Problems Electronic Signature(s) Signed: 04/10/2022 11:58:16 AM By: Duanne Guess MD FACS Entered By: Duanne Guess on 04/10/2022 11:58:16 -------------------------------------------------------------------------------- Progress Note Details Patient Name: Date of Service: Elizabeth [name] 04/10/2022 11:30 A M Medical Record Number: [id number] Patient Account Number: [id number] Date of Birth/Sex: Treating RN: 12/11/1940 (81 y.o. F) Primary Care Provider: Loura Back Other Clinician: Referring Provider: Treating Provider/Extender: Rowe Pavy in Treatment: 1 Subjective Chief Complaint Information obtained from Patient she is here in follow-up evaluation for bilateral lower extremity ulcers History of Present Illness (HPI) The following HPI elements were documented for the patient's wound: Location: bilateral lower extremity swelling with ulceration Quality: Patient reports experiencing a dull pain to affected area(s). Severity: Patient states wound(s) are getting worse. Duration: Patient has had the wound for > 3 months prior to seeking treatment at the wound center Timing: Pain in wound is Intermittent (comes and goes Context: The wound would happen gradually Modifying Factors: Patient wound(s)/ulcer(s) are improving due to:as recently in the ER and she has been put on antibiotics and Lasix Associated Signs  and Symptoms: Patient reports having increase swelling. 81 year old patient who has diabetes mellitus, hypertension, bronchitis, sleep apnea, morbid obesity was recently seen by her PCP and the ER and has been worked up for bilateral lower extremity lymphedema. The daughter and the patient says that she has had large legs for a long while and most recently she has been put on an antibiotics which was doxycycline and Lasix by her PCP and because of progressive problems she went to the ER recently on 11/19/2016. She was placed and Unna's boots and referred to the wound center. Past medical history significant for surgical removal of of her thyroid, abdominal hysterectomy. She does not smoke cigarettes. Recent x-ray of the chest showed cardiac enlargement with no evidence of active pulmonary disease. Her glucose was 120, BUN was 21 and creatinine is 1.06 and albumin was 3.3. We do not have any notes from her PCP 12/04/2016 -- she had a lower extremity arterial duplex evaluation which could  not be done due to large weeping ulcers on bilateral lower legs. Peak systolic velocity and ratio in the right popliteal arteries consistent with a 50-74% stenosis. Her venous duplex study was done yesterday but the report is not in. 12/10/2016 -- Lower Extremity venous duplex reflux evaluation was done on 12/03/2016 and it showed reflux in bilateral common femoral veins and reflux in the right small saphenous vein midsegment and reflux in the left saphenofemoral junction with no reflux visualized in the great saphenous vein or small saphenous vein. It was technically difficult exam due to body habitus. With this result she warrants a vascular consultation regarding possible intervention 12/17/2016 -- lab work reviewed showed BNP, glucose was 111 and BUN was 14 and creatinine was 0.99. Liver function tests within normal limits. A1c as per LIZBETH, FEIJOO ([id number]) [id number] Page 6 of  11 the verbal report from the patient's daughter was 6.8% 01/28/2017 -- was seen by Dr. Leonides Sake on 01/17/2017. Noninvasive vascular imaging with bilateral lower extremity venous insufficiency duplex was reviewed and bilateral lower extremity arterial duplex was reviewed and his impression was that she had bilateral chronic venous insufficiency C5 and phlebolith lymphedema bilaterally. He recommended a repeat bilateral lower extremity ABI in 6 months after she has finished healing her venous ulcers. He suspected technical limitations with the prior right lower extremity arterial duplex. He recommended continuing compression, 30-40 mmHg along with lymph pumps. 02/05/2017 -- she is having a lot of problems with her insurance coverage both for her compression wraps and for the lymphedema pumps and my nursing staff is working on this. She sees her cardiologist this coming week and will clarify the issue regarding clearance to use the lymph pumps. 02/19/2017 -- was seen by Dr. Yates Decamp, who worked her up appropriately and the patientoos resting EKG was normal and perfusion images demonstrated no evidence of ischemia and the left ventricle ejection fraction was 68% and this was a low risk study. He also noted on the arterial and venous lower extremity ultrasound that her arterial duplex done on 12/03/2016 showed biphasic PT on the right and biphasic except PT on the left. On the venous duplex done on 12/03/16 there was no GS reflux and there was a positive SSV reflux. 03/12/2017 -- her home health nurses have not been using the appropriate compression wraps but overall there has been good resolution in the size of the wound. The lymphedema still persists. She has been unable to obtain the lymphedema pumps yet due to insurance coverage and they may consider it after she finishes 6 months of therapy. 03/26/2017 -- she is close to healing and at this stage we are going to order some compression stockings  about the 30-40 mm dual layer compression and the juxta lites. Her lymphedema pumps have not yet been authorized. 04/09/17-she is here in follow-up evaluation for bilateral lower extremity ulcers. She is anticipating to get lymphedema pumps within the next 6 weeks. She was unable to get compression stocking/wraps, insurance did not cover. She presents today with no open areas, no weeping, minimal edema. We will discharge from wound care center with application of 4-layer compression wrap for home health to remove on Friday READMISSION 04/02/2022 Elizabeth [name] is a lady with chronic lymphedema. Recently she saw her primary care doctor who prescribed her over the toe and heel stockings but she was not able to apply them. Also suggested as needed Lasix apparently at 20 mg. She did have a fall about a week ago but  were not really sure what started this wound however it is on the left anterior lower leg. Note that the patient was here for a similar issue in 2018. I have reviewed her past medical history she is a type II diabetic, lymphedema, obstructive sleep apnea, asthma, gastroesophageal reflux disease, hypertension, pancytopenia, CHF and chronic venous insufficiency ABI on the left was noncompressible 04/10/2022: She removed her compression wrap and replaced it with an Ace bandage due to discomfort. She did leave her silver alginate and ABD in place. The wound measured smaller today. There is some slough on the surface. Edema control is marginal. Patient History Information obtained from Patient. Family History Diabetes - Mother, Heart Disease - Mother, Hypertension - Mother, No family history of Cancer, Hereditary Spherocytosis, Kidney Disease, Lung Disease, Seizures, Stroke, Thyroid Problems, Tuberculosis. Social History Former smoker - Quit 01/17/1964, Marital Status - Widowed, Alcohol Use - Never, Drug Use - No History, Caffeine Use - Never. Medical History Eyes Denies history of Cataracts,  Glaucoma, Optic Neuritis Ear/Nose/Mouth/Throat Denies history of Chronic sinus problems/congestion, Middle ear problems Hematologic/Lymphatic Patient has history of Anemia Denies history of Hemophilia, Human Immunodeficiency Virus, Lymphedema, Sickle Cell Disease Respiratory Patient has history of Asthma, Sleep Apnea Denies history of Aspiration, Chronic Obstructive Pulmonary Disease (COPD), Pneumothorax, Tuberculosis Cardiovascular Patient has history of Congestive Heart Failure, Hypertension, Peripheral Venous Disease Denies history of Angina, Arrhythmia, Coronary Artery Disease, Deep Vein Thrombosis, Hypotension, Myocardial Infarction, Peripheral Arterial Disease, Phlebitis, Vasculitis Gastrointestinal Denies history of Cirrhosis , Colitis, Crohnoos, Hepatitis A, Hepatitis B, Hepatitis C Endocrine Patient has history of Type II Diabetes Denies history of Type I Diabetes Genitourinary Denies history of End Stage Renal Disease Immunological Denies history of Lupus Erythematosus, Raynaudoos, Scleroderma Integumentary (Skin) Denies history of History of Burn Musculoskeletal Patient has history of Osteoarthritis Denies history of Gout, Rheumatoid Arthritis, Osteomyelitis Neurologic Patient has history of Neuropathy Denies history of Dementia, Quadriplegia, Paraplegia, Seizure Disorder Oncologic Denies history of Received Chemotherapy, Received Radiation Psychiatric Denies history of Anorexia/bulimia, Confinement Anxiety Hospitalization/Surgery History - Abdominal Hysterectomy. - Cystectomy. - Thyroid Surgery. Medical A Surgical History Notes nd Constitutional Symptoms [facility]) AFREEN, SIEBELS ([id number]) [id number] Page 7 of 11 obesity Respiratory Bronchitis Wheezing Gastrointestinal Constipation Genitourinary stage III kidney disease Musculoskeletal Unspecified Arthritis Oncologic R Breast Mass Objective Constitutional She is  hypertensive, but asymptomatic.Marland Kitchen No acute distress. Vitals Time Taken: 11:43 AM, Height: 63 in, Weight: 285 lbs, BMI: 50.5, Temperature: 97.8 F, Pulse: 85 bpm, Respiratory Rate: 18 breaths/min, Blood Pressure: 161/85 mmHg. Respiratory Normal work of breathing on room air. General Notes: 04/10/2022: The wound measured smaller today. There is some slough on the surface. Edema control is marginal. Integumentary (Hair, Skin) Wound #3 status is Open. Original cause of wound was Blister. The date acquired was: 03/26/2022. The wound has been in treatment 1 weeks. The wound is located on the Left,Anterior Lower Leg. The wound measures 1.6cm length x 4.1cm width x 0.1cm depth; 5.152cm^2 area and 0.515cm^3 volume. There is Fat Layer (Subcutaneous Tissue) exposed. There is no tunneling or undermining noted. There is a medium amount of serous drainage noted. The wound margin is distinct with the outline attached to the wound base. There is large (67-100%) red granulation within the wound bed. There is a small (1-33%) amount of necrotic tissue within the wound bed including Adherent Slough. The periwound skin appearance had no abnormalities noted for texture. The periwound skin appearance exhibited: Dry/Scaly, Hemosiderin Staining. Periwound temperature was noted as No Abnormality. 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 There was a Selective/Open Wound Non-Viable Tissue Debridement with a total area of 6.56 sq cm performed by Duanne Guess, MD. With the following instrument(s): Curette to remove Non-Viable tissue/material. Material removed includes [facility] after achieving pain control using Lidocaine 4% Topical Solution.  No specimens were taken. A time out was conducted at 11:47, prior to the start of the procedure. A Minimum amount of bleeding was controlled with Pressure. The procedure was tolerated well. Post Debridement Measurements: 1.6cm length x 4.1cm width x 0.1cm depth; 0.515cm^3 volume. Character of Wound/Ulcer Post Debridement is improved. Post procedure Diagnosis Wound #3: Same as Pre-Procedure General Notes: scribed for Dr. Lady Gary by Samuella Bruin, RN. Plan Follow-up Appointments: Return Appointment in 1 week. - Dr. Lady Gary - room 2 Anesthetic: (In clinic) Topical Lidocaine 4% applied to wound bed Bathing/ Shower/ Hygiene: May shower with protection but do not get wound dressing(s) wet. - can purchase a cast protector from [facility] or [facility] Edema Control - Lymphedema / SCD / Other: Elevate legs to the level of the heart or above for 30 minutes daily and/or when sitting, a frequency ofVANDA, WASKEY ([id number]) [id number] Page 8 of 11 Avoid standing for long periods of time. Exercise regularly Moisturize legs daily. The following medication(s) was prescribed: lidocaine topical 4 % cream cream topical was prescribed at facility WOUND #3: - Lower Leg Wound Laterality: Left, Anterior Cleanser: Soap and Water 1 x Per Week/30 Days Discharge Instructions: May shower and wash wound with dial antibacterial soap and water prior to dressing change. Cleanser: Wound Cleanser 1 x Per Week/30 Days Discharge Instructions: Cleanse the wound with wound cleanser prior to applying a clean dressing using gauze sponges, not tissue or cotton balls. Peri-Wound Care: Sween Lotion (Moisturizing lotion) 1 x Per Week/30 Days Discharge Instructions: Apply moisturizing lotion as directed Prim Dressing: Sorbalgon AG Dressing 2x2 (in/in) 1 x Per Week/30 Days ary Discharge Instructions: Apply to wound bed as instructed Secondary Dressing: ABD Pad, 5x9 1 x Per Week/30 Days Discharge  Instructions: Apply over primary dressing as directed. Secondary Dressing: Woven Gauze Sponge, Non-Sterile 4x4 in 1 x Per Week/30 Days Discharge Instructions: Apply over primary dressing as directed. Secured With: Transpore Surgical T ape, 2x10 (in/yd) 1 x Per Week/30 Days Discharge Instructions: Secure dressing with tape as directed. Com pression Wrap: Kerlix Roll 4.5x3.1 (in/yd) 1 x Per Week/30 Days Discharge Instructions: Apply Kerlix and Coban compression as directed. Com pression Wrap: Coban Self-Adherent Wrap 4x5 (in/yd) 1 x Per Week/30 Days Discharge Instructions: Apply over Kerlix as directed. Com pression Stockings: Circaid Juxta Lite Compression Wrap Compression Amount: 30-40 mmHg (left) Compression Amount: 30-40 mmHg (right) Discharge Instructions: Apply Circaid Juxta Lite Compression Wrap daily as instructed. Apply first thing in the morning, remove at night before bed. 04/10/2022: The wound measured smaller today. There is some slough on the surface. Edema control is marginal. I used a curette to debride the slough from the wound surface. We will continue silver alginate and try just Curlex and Coban wrapping to see if she tolerates this better. We are awaiting delivery of a juxta lite stocking. Follow-up in 1 week. Electronic Signature(s) Signed: 04/11/2022 8:04:52 AM By: Duanne Guess MD FACS Previous Signature: 04/10/2022 12:03:19 PM Version By: Duanne Guess MD FACS  Entered By: Duanne Guess on 04/11/2022 08:04:52 -------------------------------------------------------------------------------- HxROS Details Patient Name: Date of Service: Elizabeth [name], Elizabeth [name] 04/10/2022 11:30 A M Medical Record Number: [id number] Patient Account Number: [id number] Date of Birth/Sex: Treating RN: 1940/11/13 (81 y.o. F) Primary Care Provider: Loura Back Other Clinician: Referring Provider: Treating Provider/Extender: Rowe Pavy in Treatment: 1 Information  Obtained From Patient Constitutional Symptoms (General Health) Medical History: Past Medical History Notes: obesity Eyes Medical History: Negative for: Cataracts; Glaucoma; Optic Neuritis Ear/Nose/Mouth/Throat Medical History: Negative for: Chronic sinus problems/congestion; Middle ear problems Hematologic/Lymphatic MALAYAH, DEMURO ([id number]) [id number] Page 9 of 11 Medical History: Positive for: Anemia Negative for: Hemophilia; Human Immunodeficiency Virus; Lymphedema; Sickle Cell Disease Respiratory Medical History: Positive for: Asthma; Sleep Apnea Negative for: Aspiration; Chronic Obstructive Pulmonary Disease (COPD); Pneumothorax; Tuberculosis Past Medical History Notes: Bronchitis Wheezing Cardiovascular Medical History: Positive for: Congestive Heart Failure; Hypertension; Peripheral Venous Disease Negative for: Angina; Arrhythmia; Coronary Artery Disease; Deep Vein Thrombosis; Hypotension; Myocardial Infarction; Peripheral Arterial Disease; Phlebitis; Vasculitis Gastrointestinal Medical History: Negative for: Cirrhosis ; Colitis; Crohns; Hepatitis A; Hepatitis B; Hepatitis C Past Medical History Notes: Constipation Endocrine Medical History: Positive for: Type II Diabetes Negative for: Type I Diabetes Time with diabetes: 15 years Treated with: Oral agents Blood sugar tested every day: Yes Tested : 2x day Genitourinary Medical History: Negative for: End Stage Renal Disease Past Medical History Notes: stage III kidney disease Immunological Medical History: Negative for: Lupus Erythematosus; Raynauds; Scleroderma Integumentary (Skin) Medical History: Negative for: History of Burn Musculoskeletal Medical History: Positive for: Osteoarthritis Negative for: Gout; Rheumatoid Arthritis; Osteomyelitis Past Medical History Notes: Unspecified Arthritis Neurologic Medical History: Positive for: Neuropathy Negative for: Dementia;  Quadriplegia; Paraplegia; Seizure Disorder Oncologic Medical History: Negative for: Received Chemotherapy; Received Radiation Past Medical History Notes: R Breast Mass Psychiatric Medical History: Negative for: Anorexia/bulimia; Confinement Anxiety Immunizations NYELA, CORTINAS ([id number]) [id number] Page 10 of 11 Pneumococcal Vaccine: Received Pneumococcal Vaccination: Yes Received Pneumococcal Vaccination On or After 60th Birthday: Yes Implantable Devices No devices added Hospitalization / Surgery History Type of Hospitalization/Surgery Abdominal Hysterectomy Cystectomy Thyroid Surgery Family and Social History Cancer: No; Diabetes: Yes - Mother; Heart Disease: Yes - Mother; Hereditary Spherocytosis: No; Hypertension: Yes - Mother; Kidney Disease: No; Lung Disease: No; Seizures: No; Stroke: No; Thyroid Problems: No; Tuberculosis: No; Former smoker - Quit 01/17/1964; Marital Status - Widowed; Alcohol Use: Never; Drug Use: No History; Caffeine Use: Never; Financial Concerns: No; Food, Clothing or Shelter Needs: No; Support System Lacking: No; Transportation Concerns: No Electronic Signature(s) Signed: 04/10/2022 12:23:18 PM By: Duanne Guess MD FACS Entered By: Duanne Guess on 04/10/2022 12:01:49 -------------------------------------------------------------------------------- SuperBill Details Patient Name: Date of Service: Elizabeth [name] 04/10/2022 Medical Record Number: [id number] Patient Account Number: [id number] Date of Birth/Sex: Treating RN: Dec 13, 1940 (81 y.o. 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 with other specif Modifier: ied severity Quantity: 1 Physician Procedures : CPT4 Code Description Modifier [id number] 99213 - WC PHYS LEVEL 3 - EST PT 25 ICD-10 Diagnosis Description L97.928 Non-pressure chronic ulcer of unspecified part of  left lower leg with other specified severity E11.622 Type 2 diabetes mellitus with other  skin ulcer I89.0 Lymphedema, not elsewhere classified Quantity: 1 : [id number] 97597 - WC PHYS DEBR WO ANESTH 20 SQ CM ICD-10 Diagnosis Description L97.928 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

## 2022-04-10 NOTE — Progress Notes (Signed)
Elizabeth, [name] ([id number]) [id number] Page 1 of 7 Visit Report for 04/10/2022 Arrival Information Details Patient Name: Date of Service: Elizabeth [name], Elizabeth [name] 04/10/2022 11:30 A M Medical Record Number: [id number] Patient Account Number: [id number] Date of Birth/Sex: Treating RN: Apr 06, 1941 (81 y.o. Elizabeth [name] Primary Care Ellard Nan: Elizabeth [name] Other Clinician: Referring Atlee Kluth: Treating Graison Leinberger/Extender: Rowe Pavy in Treatment: 1 Visit Information History Since Last Visit Added or deleted any medications: No Patient Arrived: Dan Humphreys Any new allergies or adverse reactions: No Arrival Time: 11:42 Had a fall or experienced change in No Accompanied By: daughter activities of daily living that may affect Transfer Assistance: None risk of falls: Patient Identification Verified: Yes Signs or symptoms of abuse/neglect since last visito No Secondary Verification Process Completed: Yes Hospitalized since last visit: No Patient Has Alerts: Yes Implantable device outside of the clinic excluding No Patient Alerts: L ABI: [place] in clinic cellular tissue based products placed in the center since last visit: Has Dressing in Place as Prescribed: Yes Has Compression in Place as Prescribed: No Pain Present Now: No Electronic Signature(s) Signed: 04/10/2022 4:38:38 PM By: Samuella Bruin Entered By: Samuella Bruin on 04/10/2022 11:43:31 -------------------------------------------------------------------------------- Encounter Discharge Information Details Patient Name: Date of Service: Elizabeth [name] 04/10/2022 11:30 A M Medical Record Number: [id number] Patient Account Number: [id number] Date of Birth/Sex: Treating RN: [birth year] (81 y.o. Elizabeth [name] Primary Care Elizabeth [name]: Elizabeth [name] Other Clinician: Referring Akua Blethen: Treating Catalyna Reilly/Extender: Rowe Pavy in Treatment: 1 Encounter  Discharge Information Items Post Procedure Vitals Discharge Condition: Stable Temperature (F): 97.8 Ambulatory Status: Walker Pulse (bpm): 85 Discharge Destination: Home Respiratory Rate (breaths/min): 18 Transportation: Private Auto Blood Pressure (mmHg): 161/85 Accompanied By: daughter Schedule Follow-up Appointment: Yes Clinical Summary of Care: Patient Declined Electronic Signature(s) Signed: 04/10/2022 4:38:38 PM By: Samuella Bruin Entered By: Samuella Bruin on 04/10/2022 12:39:03 Elizabeth [name] ([id number]) [id number] Page 2 of 7 -------------------------------------------------------------------------------- Lower Extremity Assessment Details Patient Name: Date of Service: Elizabeth [name], Elizabeth [name] 04/10/2022 11:30 A M Medical Record Number: [id number] Patient Account Number: [id number] Date of Birth/Sex: Treating RN: November 26, 1940 (81 y.o. Elizabeth [name] Primary Care Elizabeth [name]: Elizabeth [name] Other Clinician: Referring Mckenley Birenbaum: Treating Rowin Bayron/Extender: Rowe Pavy in Treatment: 1 Edema Assessment Assessed: [Left: No] [Right: No] [Left: Edema] [Right: :] Calf Left: Right: Point of Measurement: From Medial Instep 43 cm 48 cm Ankle Left: Right: Point of Measurement: From Medial Instep 31.3 cm 32.1 cm Vascular Assessment Pulses: Dorsalis Pedis Palpable: [Left:Yes] Electronic Signature(s) Signed: 04/10/2022 4:38:38 PM By: Samuella Bruin Entered By: Samuella Bruin on 04/10/2022 11:44:22 -------------------------------------------------------------------------------- Multi Wound Chart Details Patient Name: Date of Service: Elizabeth [name] 04/10/2022 11:30 A M Medical Record Number: [id number] Patient Account Number: [id number] Date of Birth/Sex: Treating RN: [birth year] (81 y.o. F) Primary Care Elizabeth [name]: Elizabeth [name] Other Clinician: Referring Elizabeth [name]: Treating Elizabeth [name]/Extender: Rowe Pavy in Treatment: 1 Vital Signs Height(in): 63 Pulse(bpm): 85 Weight(lbs): 285 Blood Pressure(mmHg): 161/85 Body Mass Index(BMI): 50.5 Temperature(F): 97.8 Respiratory Rate(breaths/min): 18 [3:Photos:] [N/A:N/A] Left, Anterior Lower Leg N/A N/A Wound Location: Blister N/A N/A Wounding Event: Lymphedema N/A N/A Primary Etiology: Anemia, Asthma, Sleep Apnea, N/A N/A Comorbid History: Congestive Heart Failure, Hypertension, Peripheral Venous Disease, Type II Diabetes, Osteoarthritis, Neuropathy 03/26/2022 N/A N/A Date Acquired: 1 N/A N/A Weeks of Treatment: Open N/A N/A Wound Status: No N/A N/A Wound Recurrence: 1.6x4.1x0.1 N/A N/A Measurements L x W x D (cm) 5.152 N/A N/A A (cm) : rea 0.515 N/A N/A Volume (  cm) : 35.70% N/A N/A % Reduction in Area: 35.70% N/A N/A % Reduction in Volume: Full Thickness Without Exposed N/A N/A Classification: Support Structures Medium N/A N/A Exudate Amount: Serous N/A N/A Exudate Type: amber N/A N/A Exudate Color: Distinct, outline attached N/A N/A Wound Margin: Large (67-100%) N/A N/A Granulation Amount: Red N/A N/A Granulation Quality: Small (1-33%) N/A N/A Necrotic Amount: Fat Layer (Subcutaneous Tissue): Yes N/A N/A Exposed Structures: Fascia: No Tendon: No Muscle: No Joint: No Bone: No None N/A N/A Epithelialization: No Abnormalities Noted N/A N/A Periwound Skin Texture: Dry/Scaly: Yes N/A N/A Periwound Skin Moisture: Hemosiderin Staining: Yes N/A N/A Periwound Skin Color: No Abnormality N/A N/A Temperature: Treatment Notes Electronic Signature(s) Signed: 04/10/2022 11:58:22 AM By: Elizabeth Guess MD FACS Entered By: Elizabeth [name] on 04/10/2022 11:58:22 -------------------------------------------------------------------------------- Multi-Disciplinary Care Plan Details Patient Name: Date of Service: Elizabeth [name] 04/10/2022 11:30 A M Medical Record Number: [id number] Patient Account  Number: [id number] Date of Birth/Sex: Treating RN: [birth year] (81 y.o. Elizabeth [name] Primary Care Lonita Debes: Elizabeth [name] Other Clinician: Referring Nyazia Canevari: Treating Daouda Lonzo/Extender: Rowe Pavy in Treatment: 1 Active Inactive Abuse / Safety / Falls / Self Care Management Nursing Diagnoses: History of Falls Impaired physical mobility Potential for falls Goals: Patient will remain injury free related to falls Date Initiated: 04/02/2022 Target Resolution Date: 05/31/2022 Goal Status: Active Patient/caregiver will verbalize/demonstrate measure taken to improve self care Date Initiated: 04/02/2022 Target Resolution Date: 05/31/2022 KARMAN, BISWELL ([id number]) [phone].pdf Page 4 of 7 Goal Status: Active Interventions: Assess fall risk on admission and as needed Assess personal safety and home safety (as indicated) on admission and as needed Notes: Wound/Skin Impairment Nursing Diagnoses: Impaired tissue integrity Knowledge deficit related to ulceration/compromised skin integrity Goals: Patient/caregiver will verbalize understanding of skin care regimen Date Initiated: 04/02/2022 Target Resolution Date: 06/01/2022 Goal Status: Active Interventions: Assess ulceration(s) every visit Treatment Activities: Skin care regimen initiated : 04/02/2022 Topical wound management initiated : 04/02/2022 Notes: Electronic Signature(s) Signed: 04/10/2022 4:38:38 PM By: Samuella Bruin Entered By: Samuella Bruin on 04/10/2022 11:48:55 -------------------------------------------------------------------------------- Pain Assessment Details Patient Name: Date of Service: Elizabeth [name] 04/10/2022 11:30 A M Medical Record Number: [id number] Patient Account Number: [id number] Date of Birth/Sex: Treating RN: 05/26/1940 (81 y.o. Elizabeth [name] Primary Care Benn Tarver: Elizabeth [name] Other Clinician: Referring Kendy Haston: Treating  Vasilis Luhman/Extender: Rowe Pavy in Treatment: 1 Active Problems Location of Pain Severity and Description of Pain Patient Has Paino No Site Locations Rate the pain. Current Pain Level: 0 Pain Management and Medication Current Pain Management: TYKIA, MELLONE ([id number]) [phone].pdf Page 5 of 7 Electronic Signature(s) Signed: 04/10/2022 4:38:38 PM By: Gelene Mink By: Samuella Bruin on 04/10/2022 11:43:54 -------------------------------------------------------------------------------- Patient/Caregiver Education Details Patient Name: Date of Service: Elizabeth [name] 12/20/2023andnbsp11:30 A M Medical Record Number: [id number] Patient Account Number: [id number] Date of Birth/Gender: Treating RN: 12-25-1940 (81 y.o. Elizabeth [name] Primary Care Physician: Elizabeth [name] Other Clinician: Referring Physician: Treating Physician/Extender: Rowe Pavy in Treatment: 1 Education Assessment Education Provided To: Patient Education Topics Provided Wound/Skin Impairment: Methods: Explain/Verbal Responses: Reinforcements needed, State content correctly Electronic Signature(s) Signed: 04/10/2022 4:38:38 PM By: Samuella Bruin Entered By: Samuella Bruin on 04/10/2022 11:49:07 -------------------------------------------------------------------------------- Wound Assessment Details Patient Name: Date of Service: Elizabeth [name], Elizabeth [name] 04/10/2022 11:30 A M Medical Record Number: [id number] Patient Account Number: [id number] Date of Birth/Sex: Treating RN: 11/21/1940 (81 y.o. Elizabeth [name] Primary Care Annali Lybrand: Elizabeth [name] Other Clinician: Referring Tydarius Yawn: Treating Ander Wamser/Extender: Gabrielle Dare  Weeks in Treatment: 1 Wound Status Wound Number: 3 Primary Lymphedema Etiology: Wound Location: Left, Anterior Lower Leg Wound Open Wounding Event: Blister Status: Date  Acquired: 03/26/2022 Comorbid Anemia, Asthma, Sleep Apnea, Congestive Heart Failure, Weeks Of Treatment: 1 History: Hypertension, Peripheral Venous Disease, Type II Diabetes, Clustered Wound: No Osteoarthritis, Neuropathy Photos AMERICA, SANDALL ([id number]) [phone].pdf Page 6 of 7 Wound Measurements Length: (cm) 1.6 Width: (cm) 4.1 Depth: (cm) 0.1 Area: (cm) 5.152 Volume: (cm) 0.515 % Reduction in Area: 35.7% % Reduction in Volume: 35.7% Epithelialization: None Tunneling: No Undermining: No Wound Description Classification: Full Thickness Without Exposed Support Structures Wound Margin: Distinct, outline attached Exudate Amount: Medium Exudate Type: Serous Exudate Color: amber Foul Odor After Cleansing: No Slough/Fibrino Yes Wound Bed Granulation Amount: Large (67-100%) Exposed Structure Granulation Quality: Red Fascia Exposed: No Necrotic Amount: Small (1-33%) Fat Layer (Subcutaneous Tissue) Exposed: Yes Necrotic Quality: Adherent Slough Tendon Exposed: No Muscle Exposed: No Joint Exposed: No Bone Exposed: No Periwound Skin Texture Texture Color No Abnormalities Noted: Yes No Abnormalities Noted: No Hemosiderin Staining: Yes Moisture No Abnormalities Noted: No Temperature / Pain Dry / Scaly: Yes Temperature: No Abnormality Treatment Notes Wound #3 (Lower Leg) Wound Laterality: Left, Anterior Cleanser Soap and Water Discharge Instruction: May shower and wash wound with dial antibacterial soap and water prior to dressing change. Wound Cleanser Discharge Instruction: Cleanse the wound with wound cleanser prior to applying a clean dressing using gauze sponges, not tissue or cotton balls. Peri-Wound Care Sween Lotion (Moisturizing lotion) Discharge Instruction: Apply moisturizing lotion as directed Topical Primary Dressing Sorbalgon AG Dressing 2x2 (in/in) Discharge Instruction: Apply to wound bed as instructed Secondary Dressing ABD  Pad, 5x9 Discharge Instruction: Apply over primary dressing as directed. Woven Gauze Sponge, Non-Sterile 4x4 in Discharge Instruction: Apply over primary dressing as directed. Secured With Transpore Surgical Tape, 2x10 (in/yd) Discharge Instruction: Secure dressing with tape as directed. SORREL, CASSETTA ([id number]) [id number] Page 7 of 7 Compression Wrap Kerlix Roll 4.5x3.1 (in/yd) Discharge Instruction: Apply Kerlix and Coban compression as directed. Coban Self-Adherent Wrap 4x5 (in/yd) Discharge Instruction: Apply over Kerlix as directed. Compression Stockings Circaid Juxta Lite Compression Wrap Quantity: 1 Left Leg Compression Amount: 30-40 mmHg Right Leg Compression Amount: 30-40 mmHg Discharge Instruction: Apply Circaid Juxta Lite Compression Wrap daily as instructed. Apply first thing in the morning, remove at night before bed. Add-Ons Electronic Signature(s) Signed: 04/10/2022 4:38:38 PM By: Gelene Mink By: Samuella Bruin on 04/10/2022 11:47:34 -------------------------------------------------------------------------------- Vitals Details Patient Name: Date of Service: Elizabeth [name] 04/10/2022 11:30 A M Medical Record Number: [id number] Patient Account Number: [id number] Date of Birth/Sex: Treating RN: 08/02/1940 (81 y.o. Elizabeth [name] Primary Care Dent Plantz: Elizabeth [name] Other Clinician: Referring Zoha Spranger: Treating Gwen Edler/Extender: Rowe Pavy in Treatment: 1 Vital Signs Time Taken: 11:43 Temperature (F): 97.8 Height (in): 63 Pulse (bpm): 85 Weight (lbs): 285 Respiratory Rate (breaths/min): 18 Body Mass Index (BMI): 50.5 Blood Pressure (mmHg): 161/85 Reference Range: 80 - 120 mg / dl Electronic Signature(s) Signed: 04/10/2022 4:38:38 PM By: Samuella Bruin Entered By: Samuella Bruin on 04/10/2022 11:43:49

## 2022-04-24 ENCOUNTER — Encounter (HOSPITAL_BASED_OUTPATIENT_CLINIC_OR_DEPARTMENT_OTHER): Payer: 59 | Attending: General Surgery | Admitting: General Surgery

## 2022-04-24 DIAGNOSIS — I110 Hypertensive heart disease with heart failure: Secondary | ICD-10-CM | POA: Diagnosis not present

## 2022-04-24 DIAGNOSIS — I890 Lymphedema, not elsewhere classified: Secondary | ICD-10-CM | POA: Diagnosis not present

## 2022-04-24 DIAGNOSIS — Z6841 Body Mass Index (BMI) 40.0 and over, adult: Secondary | ICD-10-CM | POA: Diagnosis not present

## 2022-04-24 DIAGNOSIS — L97928 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 with other specified severity: Secondary | ICD-10-CM | POA: Diagnosis not present

## 2022-04-24 DIAGNOSIS — E11622 Type 2 diabetes mellitus with other skin ulcer: Secondary | ICD-10-CM | POA: Insufficient documentation

## 2022-04-24 DIAGNOSIS — E1140 Type 2 diabetes mellitus with diabetic neuropathy, unspecified: Secondary | ICD-10-CM | POA: Diagnosis not present

## 2022-04-24 DIAGNOSIS — I509 Heart failure, unspecified: Secondary | ICD-10-CM | POA: Insufficient documentation

## 2022-04-24 DIAGNOSIS — G4733 Obstructive sleep apnea (adult) (pediatric): Secondary | ICD-10-CM | POA: Insufficient documentation

## 2022-04-24 NOTE — Progress Notes (Signed)
KEANDRIA, BERROCAL ([id number]) [id number] Page 1 of 8 Visit Report for 04/24/2022 Arrival Information Details Patient Name: Date of Service: EVE, REY 04/24/2022 3:30 PM Medical Record Number: [id number] Patient Account Number: [id number] Date of Birth/Sex: Treating RN: 1940-08-17 (82 y.o. F) Primary Care Kamarri Fischetti: Arthur Holms Other Clinician: Referring Jabar Krysiak: Treating Adam Sanjuan/Extender: Hollice Espy in Treatment: 3 Visit Information History Since Last Visit All ordered tests and consults were completed: No Patient Arrived: Gilford Rile Added or deleted any medications: No Arrival Time: 15:59 Any new allergies or adverse reactions: No Accompanied By: daughter Had a fall or experienced change in No Transfer Assistance: Manual activities of daily living that may affect Patient Identification Verified: Yes risk of falls: Secondary Verification Process Completed: Yes Signs or symptoms of abuse/neglect since last visito No Patient Has Alerts: Yes Hospitalized since last visit: No Patient Alerts: L ABI: [place] in clinic Implantable device outside of the clinic excluding No cellular tissue based products placed in the center since last visit: Pain Present Now: No Electronic Signature(s) Signed: 04/24/2022 4:37:24 PM By: Dellie Catholic RN Previous Signature: 04/24/2022 4:07:32 PM Version By: Worthy Rancher Entered By: Dellie Catholic on 04/24/2022 16:35:28 -------------------------------------------------------------------------------- Clinic Level of Care Assessment Details Patient Name: Date of Service: GENESEE, NASE 04/24/2022 3:30 PM Medical Record Number: [id number] Patient Account Number: [id number] Date of Birth/Sex: Treating RN: September 27, 1940 (82 y.o. America Brown Primary Care Zaylyn Bergdoll: Arthur Holms Other Clinician: Referring Scarlet Abad: Treating Arasely Akkerman/Extender: Hollice Espy in Treatment: 3 Clinic Level of  Care Assessment Items TOOL 4 Quantity Score X- 1 0 Use when only an EandM is performed on FOLLOW-UP visit ASSESSMENTS - Nursing Assessment / Reassessment X- 1 10 Reassessment of Co-morbidities (includes updates in patient status) X- 1 5 Reassessment of Adherence to Treatment Plan ASSESSMENTS - Wound and Skin A ssessment / Reassessment X - Simple Wound Assessment / Reassessment - one wound 1 5 []  - 0 Complex Wound Assessment / Reassessment - multiple wounds []  - 0 Dermatologic / Skin Assessment (not related to wound area) ASSESSMENTS - Focused Assessment X- 1 5 Circumferential Edema Measurements - multi extremities []  - 0 Nutritional Assessment / Counseling / Intervention CANDY, LEVERETT ([id number]) [id number] Page 2 of 8 []  - 0 Lower Extremity Assessment (monofilament, tuning fork, pulses) []  - 0 Peripheral Arterial Disease Assessment (using hand held doppler) ASSESSMENTS - Ostomy and/or Continence Assessment and Care []  - 0 Incontinence Assessment and Management []  - 0 Ostomy Care Assessment and Management (repouching, etc.) PROCESS - Coordination of Care X - Simple Patient / Family Education for ongoing care 1 15 []  - 0 Complex (extensive) Patient / Family Education for ongoing care X- 1 10 Staff obtains Programmer, systems, Records, T Results / Process Orders est X- 1 10 Staff telephones HHA, Nursing Homes / Clarify orders / etc []  - 0 Routine Transfer to another Facility (non-emergent condition) []  - 0 Routine Hospital Admission (non-emergent condition) []  - 0 New Admissions / Biomedical engineer / Ordering NPWT Apligraf, etc. , []  - 0 Emergency Hospital Admission (emergent condition) X- 1 10 Simple Discharge Coordination []  - 0 Complex (extensive) Discharge Coordination PROCESS - Special Needs []  - 0 Pediatric / Minor Patient Management []  - 0 Isolation Patient Management []  - 0 Hearing / Language / Visual special needs []  -  0 Assessment of Community assistance (transportation, D/C planning, etc.) []  - 0 Additional assistance / Altered mentation []  - 0 Support Surface(s) Assessment (bed, cushion, seat, etc.) INTERVENTIONS - Wound Cleansing /  Measurement X - Simple Wound Cleansing - one wound 1 5 []  - 0 Complex Wound Cleansing - multiple wounds X- 1 5 Wound Imaging (photographs - any number of wounds) []  - 0 Wound Tracing (instead of photographs) X- 1 5 Simple Wound Measurement - one wound []  - 0 Complex Wound Measurement - multiple wounds INTERVENTIONS - Wound Dressings X - Small Wound Dressing one or multiple wounds 1 10 []  - 0 Medium Wound Dressing one or multiple wounds []  - 0 Large Wound Dressing one or multiple wounds []  - 0 Application of Medications - topical []  - 0 Application of Medications - injection INTERVENTIONS - Miscellaneous []  - 0 External ear exam []  - 0 Specimen Collection (cultures, biopsies, blood, body fluids, etc.) []  - 0 Specimen(s) / Culture(s) sent or taken to Lab for analysis []  - 0 Patient Transfer (multiple staff / / Similar devices) []  - 0 Simple Staple / Suture removal (25 or less) []  - 0 Complex Staple / Suture removal (26 or more) []  - 0 Hypo / Hyperglycemic Management (close monitor of Blood Glucose) ADRIEL, DESROSIER (  .pdf Page 3 of 8 []  - 0 Ankle / Brachial Index (ABI) - do not check if billed separately X- 1 5 Vital Signs Has the patient been seen at the hospital within the last three years: Yes Total Score: 100 Level Of Care: New/Established - Level 3 Electronic Signature(s) Signed: 04/24/2022 4:37:24 PM By: RN Entered By: on 04/24/2022 16:34:22 -------------------------------------------------------------------------------- Encounter Discharge Information Details Patient Name: Date of Service: 04/24/2022 3:30 PM Medical Record Number:  Patient Account Number: Nurse, adult Date of Birth/Sex: Treating RN: 17-Aug-1940 (82 y.o. Primary Care Abhi Moccia: [name] Other Clinician: Referring Jamiere Gulas: Treating Kong Packett/Extender: [id number] in Treatment: 3 Encounter Discharge Information Items Discharge Condition: Stable Ambulatory Status: Walker Discharge Destination: Home Transportation: Private Auto Accompanied By: daughter Schedule Follow-up Appointment: Yes Clinical Summary of Care: Patient Declined Electronic Signature(s) Signed: 04/24/2022 4:37:24 PM By: RN Entered By: 06/23/2022 on 04/24/2022 16:35:05 -------------------------------------------------------------------------------- Lower Extremity Assessment Details Patient Name: Date of Service: KAYTELYN, GLORE 04/24/2022 3:30 PM Medical Record Number: [name] Patient Account Number: 06/23/2022 Date of Birth/Sex: Treating RN: 1940/12/06 (82 y.o. 06/23/1940 Primary Care Hameed Kolar: 94 Other Clinician: Referring Anselma Herbel: Treating Dailah Opperman/Extender: Katrinka Blazing in Treatment: 3 Edema Assessment Assessed: [Left: No] [Right: No] [Left: Edema] [Right: :] Calf Left: Right: Point of Measurement: 35 cm From Medial Instep 43.2 cm 48 cm Ankle Left: Right: Point of Measurement: 10 cm From Medial Instep 31.9 cm 32.1 cm Knee To Floor Left: Right: KIYAH, DEMARTINI (Rowe Pavy) [phone].pdf Page 4 of 8 From Medial Instep 37 cm 37 cm Vascular Assessment Pulses: Dorsalis Pedis Palpable: [Left:Yes] Electronic Signature(s) Signed: 04/24/2022 4:37:24 PM By: Karie Schwalbe RN Entered By: 06/23/2022 on 04/24/2022 16:10:02 -------------------------------------------------------------------------------- Multi Wound Chart Details Patient Name: Date of Service: 06/23/2022 04/24/2022 3:30 PM Medical Record Number: [id number] Patient  Account Number: 06/23/1940 Date of Birth/Sex: Treating RN: February 02, 1941 (82 y.o. F) Primary Care Barclay Lennox: Loura Back Other Clinician: Referring Riyana Biel: Treating Aydia Maj/Extender: Rowe Pavy in Treatment: 3 Vital Signs Height(in): 63 Capillary Blood Glucose(mg/dl): [name] Weight(lbs): 185631497 Pulse(bpm): 88 Body Mass Index(BMI): 50.5 Blood Pressure(mmHg): 160/85 Temperature(F): 98.3 Respiratory Rate(breaths/min): 20 [3:Photos: No Photos Left, Anterior Lower Leg Wound Location: Blister Wounding Event: Lymphedema Primary Etiology: Anemia, Asthma, Sleep Apnea, Comorbid History: Congestive Heart Failure,  Hypertension, Peripheral Venous Disease, Type II Diabetes, Osteoarthritis,  Neuropathy 03/26/2022 Date Acquired: 3 Weeks of Treatment: Open Wound Status: No Wound Recurrence: 1.6x0.3x0.1 Measurements L x W x D (cm) 0.377 A (cm) : rea 0.038 Volume (cm) : 95.30% % Reduction in Area: 95.30% % Reduction in Volume: Full Thickness  Without Exposed Classification: Support Structures Medium Exudate Amount: Serous Exudate Type: amber Exudate Color: Distinct, outline attached Wound Margin: Large (67-100%) Granulation Amount: Red Granulation Quality: Small (1-33%) Necrotic Amount: Fat  Layer (Subcutaneous Tissue): Yes N/A Exposed Structures: Fascia: No Tendon: No Muscle: No Joint: No Bone: No None Epithelialization: No Abnormalities Noted Periwound Skin Texture: Dry/Scaly: Yes Periwound Skin Moisture: Hemosiderin Staining: Yes  Periwound Skin Color: No Abnormality Temperature:] [N/A:N/A N/A N/A N/A N/A N/A N/A N/A N/A N/A N/A N/A N/A N/A N/A N/A N/A N/A N/A N/A N/A N/A N/A N/A N/A N/A N/A] HILDAGARDE, HOLLERAN ([id number]) [3:Treatment Notes] Electronic Signature(s) Signed: 04/24/2022 4:20:13 PM By: Duanne Guess MD FACS Entered By: Duanne Guess on 04/24/2022 16:20:13 -------------------------------------------------------------------------------- Multi-Disciplinary Care Plan  Details Patient Name: Date of Service: [name] 04/24/2022 3:30 PM Medical Record Number: [id number] Patient Account Number: [id number] Date of Birth/Sex: Treating RN: [birth year] (82 y.o. Katrinka Blazing Primary Care [place] Every: Loura Back Other Clinician: Referring Jaquarius Seder: Treating Thurmond Hildebran/Extender: Rowe Pavy in Treatment: 3 Active Inactive Abuse / Safety / Falls / Self Care Management Nursing Diagnoses: History of Falls Impaired physical mobility Potential for falls Goals: Patient will remain injury free related to falls Date Initiated: 04/02/2022 Target Resolution Date: 07/21/2022 Goal Status: Active Patient/caregiver will verbalize/demonstrate measure taken to improve self care Date Initiated: 04/02/2022 Target Resolution Date: 07/21/2022 Goal Status: Active Interventions: Assess fall risk on admission and as needed Assess personal safety and home safety (as indicated) on admission and as needed Notes: Wound/Skin Impairment Nursing Diagnoses: Impaired tissue integrity Knowledge deficit related to ulceration/compromised skin integrity Goals: Patient/caregiver will verbalize understanding of skin care regimen Date Initiated: 04/02/2022 Target Resolution Date: 07/21/2022 Goal Status: Active Interventions: Assess ulceration(s) every visit Treatment Activities: Skin care regimen initiated : 04/02/2022 Topical wound management initiated : 04/02/2022 Notes: Electronic Signature(s) Signed: 04/24/2022 4:37:24 PM By: Karie Schwalbe RN Entered By: Karie Schwalbe on 04/24/2022 16:31:14 [name] ([id number]) [id number] Page 6 of 8 -------------------------------------------------------------------------------- Pain Assessment Details Patient Name: Date of Service: MAELYS, KINNICK 04/24/2022 3:30 PM Medical Record Number: [id number] Patient Account Number: [id number] Date of Birth/Sex: Treating RN: 31-Mar-1941  (82 y.o. F) Primary Care Saryiah Bencosme: Loura Back Other Clinician: Referring Jaser Fullen: Treating Momoko Slezak/Extender: Rowe Pavy in Treatment: 3 Active Problems Location of Pain Severity and Description of Pain Patient Has Paino Yes Site Locations Rate the pain. Current Pain Level: 5 Worst Pain Level: 10 Least Pain Level: 0 Tolerable Pain Level: 3 Pain Management and Medication Current Pain Management: Electronic Signature(s) Signed: 04/24/2022 4:07:32 PM By: Dayton Scrape Entered By: Dayton Scrape on 04/24/2022 16:01:36 -------------------------------------------------------------------------------- Patient/Caregiver Education Details Patient Name: Date of Service: [name] 1/3/2024andnbsp3:30 PM Medical Record Number: [id number] Patient Account Number: [id number] Date of Birth/Gender: Treating RN: 10/03/1940 (82 y.o. Katrinka Blazing Primary Care Physician: Loura Back Other Clinician: Referring Physician: Treating Physician/Extender: Rowe Pavy in Treatment: 3 Education Assessment Education Provided To: Patient Education Topics Provided Wound/Skin Impairment: Methods: Explain/Verbal Responses: Return demonstration correctly LOVELEE, FORNER ([id number]) [id number] Page 7 of 8 Electronic Signature(s) Signed: 04/24/2022 4:37:24 PM By: Karie Schwalbe RN Entered By: Karie Schwalbe on 04/24/2022 16:32:24 -------------------------------------------------------------------------------- Wound  Assessment Details Patient Name: Date of Service: AITANNA, HAUBNER 04/24/2022 3:30 PM Medical Record Number: [id number] Patient Account Number: [id number] Date of Birth/Sex: Treating RN: [birth year] (82 y.o. America Brown Primary Care Leavy Heatherly: Arthur Holms Other Clinician: Referring Yves Fodor: Treating Briggs Edelen/Extender: Hollice Espy in Treatment: 3 Wound Status Wound Number: 3 Primary  Lymphedema Etiology: Wound Location: Left, Anterior Lower Leg Wound Open Wounding Event: Blister Status: Date Acquired: 03/26/2022 Comorbid Anemia, Asthma, Sleep Apnea, Congestive Heart Failure, Weeks Of Treatment: 3 History: Hypertension, Peripheral Venous Disease, Type II Diabetes, Clustered Wound: No Osteoarthritis, Neuropathy Wound Measurements Length: (cm) 1.6 Width: (cm) 0.3 Depth: (cm) 0.1 Area: (cm) 0.377 Volume: (cm) 0.038 % Reduction in Area: 95.3% % Reduction in Volume: 95.3% Epithelialization: None Tunneling: No Undermining: No Wound Description Classification: Full Thickness Without Exposed Support Structures Wound Margin: Distinct, outline attached Exudate Amount: Medium Exudate Type: Serous Exudate Color: amber Foul Odor After Cleansing: No Slough/Fibrino Yes Wound Bed Granulation Amount: Large (67-100%) Exposed Structure Granulation Quality: Red Fascia Exposed: No Necrotic Amount: Small (1-33%) Fat Layer (Subcutaneous Tissue) Exposed: Yes Necrotic Quality: Adherent Slough Tendon Exposed: No Muscle Exposed: No Joint Exposed: No Bone Exposed: No Periwound Skin Texture Texture Color No Abnormalities Noted: Yes No Abnormalities Noted: No Hemosiderin Staining: Yes Moisture No Abnormalities Noted: No Temperature / Pain Dry / Scaly: Yes Temperature: No Abnormality Treatment Notes Wound #3 (Lower Leg) Wound Laterality: Left, Anterior Cleanser Soap and Water Discharge Instruction: May shower and wash wound with dial antibacterial soap and water prior to dressing change. Wound Cleanser Discharge Instruction: Cleanse the wound with wound cleanser prior to applying a clean dressing using gauze sponges, not tissue or cotton balls. Peri-Wound Care Sween Lotion (Moisturizing lotion) KAYDYNCE, PAT ([id number]) [id number] Page 8 of 8 Discharge Instruction: Apply moisturizing lotion as directed Topical Primary  Dressing Sorbalgon AG Dressing 2x2 (in/in) Discharge Instruction: Apply to wound bed as instructed Secondary Dressing ABD Pad, 5x9 Discharge Instruction: Apply over primary dressing as directed. Woven Gauze Sponge, Non-Sterile 4x4 in Discharge Instruction: Apply over primary dressing as directed. Secured With Transpore Surgical Tape, 2x10 (in/yd) Discharge Instruction: Secure dressing with tape as directed. Compression Wrap Kerlix Roll 4.5x3.1 (in/yd) Discharge Instruction: Apply Kerlix and Coban compression as directed. Coban Self-Adherent Wrap 4x5 (in/yd) Discharge Instruction: Apply over Kerlix as directed. Compression Stockings Circaid Juxta Lite Compression Wrap Quantity: 1 Left Leg Compression Amount: 30-40 mmHg Right Leg Compression Amount: 30-40 mmHg Discharge Instruction: Apply Circaid Juxta Lite Compression Wrap daily as instructed. Apply first thing in the morning, remove at night before bed. Add-Ons Electronic Signature(s) Signed: 04/24/2022 4:37:24 PM By: Dellie Catholic RN Entered By: Dellie Catholic on 04/24/2022 16:10:49 -------------------------------------------------------------------------------- Vitals Details Patient Name: Date of Service: Armando Gang 04/24/2022 3:30 PM Medical Record Number: [id number] Patient Account Number: [id number] Date of Birth/Sex: Treating RN: 1940/12/12 (82 y.o. F) Primary Care Nashonda Limberg: Arthur Holms Other Clinician: Referring Sherod Cisse: Treating Gabryela Kimbrell/Extender: Hollice Espy in Treatment: 3 Vital Signs Time Taken: 04:00 Temperature (F): 98.3 Height (in): 63 Pulse (bpm): 88 Weight (lbs): 285 Respiratory Rate (breaths/min): 20 Body Mass Index (BMI): 50.5 Blood Pressure (mmHg): 160/85 Capillary Blood Glucose (mg/dl): 120 Reference Range: 80 - 120 mg / dl Electronic Signature(s) Signed: 04/24/2022 4:07:32 PM By: Worthy Rancher Entered By: Worthy Rancher on 04/24/2022 16:01:09

## 2022-04-24 NOTE — Progress Notes (Signed)
KEONA, BILYEU ([id number]) [id number] Page 1 of 9 Visit Report for 04/24/2022 Chief Complaint Document Details Patient Name: Date of Service: Elizabeth [name], FLATER 04/24/2022 3:30 PM Medical Record Number: [id number] Patient Account Number: [id number] Date of Birth/Sex: Treating RN: 28-Jul-1940 (82 y.o. F) Primary Care Provider: Loura Back Other Clinician: Referring Provider: Treating Provider/Extender: Rowe Pavy in Treatment: 3 Information Obtained from: Patient Chief Complaint she is here in follow-up evaluation for bilateral lower extremity ulcers Electronic Signature(s) Signed: 04/24/2022 4:20:20 PM By: Duanne Guess MD FACS Entered By: Duanne Guess on 04/24/2022 16:20:19 -------------------------------------------------------------------------------- HPI Details Patient Name: Date of Service: Elizabeth [name] 04/24/2022 3:30 PM Medical Record Number: [id number] Patient Account Number: [id number] Date of Birth/Sex: Treating RN: Oct 26, 1940 (82 y.o. F) Primary Care Provider: Loura Back Other Clinician: Referring Provider: Treating Provider/Extender: Rowe Pavy in Treatment: 3 History of Present Illness Location: bilateral lower extremity swelling with ulceration Quality: Patient reports experiencing a dull pain to affected area(s). Severity: Patient states wound(s) are getting worse. Duration: Patient has had the wound for > 3 months prior to seeking treatment at the wound center Timing: Pain in wound is Intermittent (comes and goes Context: The wound would happen gradually Modifying Factors: Patient wound(s)/ulcer(s) are improving due to:as recently in the ER and she has been put on antibiotics and Lasix ssociated Signs and Symptoms: Patient reports having increase swelling. A HPI Description: 82 year old patient who has diabetes mellitus, hypertension, bronchitis, sleep apnea, morbid obesity was  recently seen by her PCP and the ER and has been worked up for bilateral lower extremity lymphedema. The daughter and the patient says that she has had large legs for a long while and most recently she has been put on an antibiotics which was doxycycline and Lasix by her PCP and because of progressive problems she went to the ER recently on 11/19/2016. She was placed and Unna's boots and referred to the wound center. Past medical history significant for surgical removal of of her thyroid, abdominal hysterectomy. She does not smoke cigarettes. Recent x-ray of the chest showed cardiac enlargement with no evidence of active pulmonary disease. Her glucose was 120, BUN was 21 and creatinine is 1.06 and albumin was 3.3. We do not have any notes from her PCP 12/04/2016 -- she had a lower extremity arterial duplex evaluation which could not be done due to large weeping ulcers on bilateral lower legs. Peak systolic velocity and ratio in the right popliteal arteries consistent with a 50-74% stenosis. Her venous duplex study was done yesterday but the report is not in. 12/10/2016 -- Lower Extremity venous duplex reflux evaluation was done on 12/03/2016 and it showed reflux in bilateral common femoral veins and reflux in the right small saphenous vein midsegment and reflux in the left saphenofemoral junction with no reflux visualized in the great saphenous vein or small saphenous vein. It was technically difficult exam due to body habitus. With this result she warrants a vascular consultation regarding possible intervention 12/17/2016 -- lab work reviewed showed BNP, glucose was 111 and BUN was 14 and creatinine was 0.99. Liver function tests within normal limits. A1c as per the verbal report from the patient's daughter was 6.8% 01/28/2017 -- was seen by Dr. Leonides Sake on 01/17/2017. Noninvasive vascular imaging with bilateral lower extremity venous insufficiency duplex was reviewed and bilateral lower  extremity arterial duplex was reviewed and his impression was that she had bilateral chronic venous insufficiency C5 and phlebolith lymphedema bilaterally. He recommended a repeat  bilateral lower extremity ABI in 6 months after she has finished healing her venous ulcers. He suspected Elizabeth MoritaWILHITE, [name] ([id number]) [id number] Page 2 of 9 technical limitations with the prior right lower extremity arterial duplex. He recommended continuing compression, 30-40 mmHg along with lymph pumps. 02/05/2017 -- she is having a lot of problems with her insurance coverage both for her compression wraps and for the lymphedema pumps and my nursing staff is working on this. She sees her cardiologist this coming week and will clarify the issue regarding clearance to use the lymph pumps. 02/19/2017 -- was seen by Dr. Yates DecampJay Ganji, who worked her up appropriately and the patients resting EKG was normal and perfusion images demonstrated no evidence of ischemia and the left ventricle ejection fraction was 68% and this was a low risk study. He also noted on the arterial and venous lower extremity ultrasound that her arterial duplex done on 12/03/2016 showed biphasic PT on the right and biphasic except PT on the left. On the venous duplex done on 12/03/16 there was no GS reflux and there was a positive SSV reflux. 03/12/2017 -- her home health nurses have not been using the appropriate compression wraps but overall there has been good resolution in the size of the wound. The lymphedema still persists. She has been unable to obtain the lymphedema pumps yet due to insurance coverage and they may consider it after she finishes 6 months of therapy. 03/26/2017 -- she is close to healing and at this stage we are going to order some compression stockings about the 30-40 mm dual layer compression and the juxta lites. Her lymphedema pumps have not yet been authorized. 04/09/17-she is here in follow-up evaluation  for bilateral lower extremity ulcers. She is anticipating to get lymphedema pumps within the next 6 weeks. She was unable to get compression stocking/wraps, insurance did not cover. She presents today with no open areas, no weeping, minimal edema. We will discharge from wound care center with application of 4-layer compression wrap for home health to remove on Friday READMISSION 04/02/2022 Mrs. Heinz is a lady with chronic lymphedema. Recently she saw her primary care doctor who prescribed her over the toe and heel stockings but she was not able to apply them. Also suggested as needed Lasix apparently at 20 mg. She did have a fall about a week ago but were not really sure what started this wound however it is on the left anterior lower leg. Note that the patient was here for a similar issue in 2018. I have reviewed her past medical history she is a type II diabetic, lymphedema, obstructive sleep apnea, asthma, gastroesophageal reflux disease, hypertension, pancytopenia, CHF and chronic venous insufficiency ABI on the left was noncompressible 04/10/2022: She removed her compression wrap and replaced it with an Ace bandage due to discomfort. She did leave her silver alginate and ABD in place. The wound measured smaller today. There is some slough on the surface. Edema control is marginal. 04/24/2022: The wound is down to just 3 tiny openings. They are superficial and clean. Edema control remains marginal. Electronic Signature(s) Signed: 04/24/2022 4:21:08 PM By: Duanne Guessannon, Marlyn Rabine MD FACS Entered By: Duanne Guessannon, Tarshia Kot on 04/24/2022 16:21:08 -------------------------------------------------------------------------------- Physical Exam Details Patient Name: Date of Service: Elizabeth MoritaWILHITE, Tewana H. 04/24/2022 3:30 PM Medical Record Number: [id number] Patient Account Number: [id number] Date of Birth/Sex: Treating RN: 11/26/1940 (82 y.o. F) Primary Care Provider: Loura BackNguyen, Kim Other Clinician: Referring  Provider: Treating Provider/Extender: Rowe Pavyannon, Kamdin Follett Nguyen, Kim Weeks in Treatment: 3 Constitutional She is  hypertensive, but asymptomatic.. . . . No acute distress. Respiratory Normal work of breathing on room air. Notes 04/24/2022: The wound is down to just 3 tiny openings. They are superficial and clean. Edema control remains marginal. Electronic Signature(s) Signed: 04/24/2022 4:21:45 PM By: Duanne Guessannon, Najah Liverman MD FACS Entered By: Duanne Guessannon, Shantese Raven on 04/24/2022 16:21:45 Elizabeth MoritaWILHITE, Keairra H ([id number]) [id number] Page 3 of 9 -------------------------------------------------------------------------------- Physician Orders Details Patient Name: Date of Service: Elizabeth MoritaWILHITE, Ameera H. 04/24/2022 3:30 PM Medical Record Number: [id number] Patient Account Number: [id number] Date of Birth/Sex: Treating RN: 10/24/1940 (82 y.o. 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 - Dr. Lady Garyannon - room 2 Return A Anesthetic (In clinic) Topical Lidocaine 4% applied to wound bed Edema Control - Lymphedema / SCD / Other Avoid standing for long periods of time. Exercise regularly Moisturize legs daily. Wound Treatment Wound #3 - Lower Leg Wound Laterality: Left, Anterior Cleanser: Soap and Water 1 x Per Week/30 Days Discharge Instructions: May shower and wash wound with dial antibacterial soap and water prior to dressing change. Cleanser: Wound Cleanser 1 x Per Week/30 Days Discharge Instructions: Cleanse the wound with wound cleanser prior  to applying a clean dressing using gauze sponges, not tissue or cotton balls. Peri-Wound Care: Sween Lotion (Moisturizing lotion) 1 x Per Week/30 Days Discharge Instructions: Apply moisturizing lotion as directed Prim Dressing: Sorbalgon AG Dressing 2x2 (in/in) 1 x Per Week/30 Days ary Discharge Instructions: Apply to wound bed as instructed Secondary Dressing: ABD Pad, 5x9 1 x Per Week/30 Days Discharge Instructions: Apply over primary dressing as directed. Secondary Dressing: Woven Gauze Sponge, Non-Sterile 4x4 in 1 x Per Week/30 Days Discharge Instructions: Apply over primary dressing as directed. Secured With: Transpore Surgical Tape, 2x10 (in/yd) 1 x Per Week/30 Days Discharge Instructions: Secure dressing with tape as directed. Compression Wrap: Kerlix Roll 4.5x3.1 (in/yd) 1 x Per Week/30 Days Discharge Instructions: Apply Kerlix and Coban compression as directed. Compression Wrap: Coban Self-Adherent Wrap 4x5 (in/yd) 1 x Per Week/30 Days Discharge Instructions: Apply over Kerlix as directed. Compression Stockings: Circaid Juxta Lite Compression Wrap (DME) Left Leg Compression Amount: 30-40 mmHG Right Leg Compression Amount: 30-40 mmHG Discharge Instructions: Apply Circaid Juxta Lite Compression Wrap daily as instructed. Apply first thing in the morning, remove at night before bed. Electronic Signature(s) Signed: 04/24/2022 4:24:49 PM By: Duanne Guessannon, Taneesha Edgin MD FACS Entered By: Duanne Guessannon, Litzy Dicker on 04/24/2022 16:21:58 Elizabeth MoritaWILHITE, Shanaiya H ([id number]) [id number] Page 4 of 9 -------------------------------------------------------------------------------- Problem List Details Patient Name: Date of Service: Elizabeth MoritaWILHITE, Greidy H. 04/24/2022 3:30 PM Medical Record Number: [id number] Patient Account Number: [id number] Date of Birth/Sex: Treating RN: 09/22/1940 (82 y.o. 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 with other 04/02/2022 No Yes specified severity I89.0 Lymphedema, not elsewhere classified 04/02/2022 No Yes E11.622 Type 2 diabetes mellitus with other skin ulcer 04/02/2022 No Yes Inactive Problems Resolved Problems Electronic Signature(s) Signed: 04/24/2022 4:20:04 PM By: Duanne Guessannon, Khoa Opdahl MD FACS Entered By: Duanne Guessannon, Keysha Damewood on 04/24/2022 16:20:04 -------------------------------------------------------------------------------- Progress Note  Details Patient Name: Date of Service: Elizabeth [name], LOUIS 04/24/2022 3:30 PM Medical Record Number: [id number] Patient Account Number: [id number] Date of Birth/Sex: Treating RN: 1940/09/27 (82 y.o. F) Primary Care Provider: Loura Back Other Clinician: Referring Provider: Treating Provider/Extender: Rowe Pavy in Treatment: 3 Subjective Chief Complaint Information obtained from Patient she is here in follow-up evaluation for bilateral lower extremity ulcers History of Present Illness (HPI) The following HPI elements were documented for the patient's wound: Location: bilateral lower extremity swelling with ulceration Quality: Patient reports experiencing a dull pain to affected area(s). Severity: Patient states wound(s) are getting worse. Duration: Patient has had the wound for > 3 months prior to seeking treatment at the wound center Timing: Pain in wound is Intermittent (comes and goes Context: The wound would happen gradually Modifying Factors: Patient wound(s)/ulcer(s) are improving due to:as recently in the ER and she has been put on antibiotics and Lasix Associated Signs and Symptoms: Patient reports having increase swelling. KATRIA, BOTTS ([id number]) [id number] Page 68 of 29 82 year old  patient who has diabetes mellitus, hypertension, bronchitis, sleep apnea, morbid obesity was recently seen by her PCP and the ER and has been worked up for bilateral lower extremity lymphedema. The daughter and the patient says that she has had large legs for a long while and most recently she has been put on an antibiotics which was doxycycline and Lasix by her PCP and because of progressive problems she went to the ER recently on 11/19/2016. She was placed and Unna's boots and referred to the wound center. Past medical history significant for surgical removal of of her thyroid, abdominal hysterectomy. She does not smoke cigarettes. Recent x-ray of the chest showed cardiac enlargement with no evidence of active pulmonary disease. Her glucose was 120, BUN was 21 and creatinine is 1.06 and albumin was 3.3. We do not have any notes from her PCP 12/04/2016 -- she had a lower extremity arterial duplex evaluation which could not be done due to large weeping ulcers on bilateral lower legs. Peak systolic velocity and ratio in the right popliteal arteries consistent with a 50-74% stenosis. Her venous duplex study was done yesterday but the report is not in. 12/10/2016 -- Lower Extremity venous duplex reflux evaluation was done on 12/03/2016 and it showed reflux in bilateral common femoral veins and reflux in the right small saphenous vein midsegment and reflux in the left saphenofemoral junction with no reflux visualized in the great saphenous vein or small saphenous vein. It was technically difficult exam due to body habitus. With this result she warrants a vascular consultation regarding possible intervention 12/17/2016 -- lab work reviewed showed BNP, glucose was 111 and BUN was 14 and creatinine was 0.99. Liver function tests within normal limits. A1c as per the verbal report from the patient's daughter was 6.8% 01/28/2017 -- was seen by Dr. Leonides Sake on 01/17/2017. Noninvasive vascular imaging with  bilateral lower extremity venous insufficiency duplex was reviewed and bilateral lower extremity arterial duplex was reviewed and his impression was that she had bilateral chronic venous insufficiency C5 and phlebolith lymphedema bilaterally. He recommended a repeat bilateral lower extremity ABI in 6 months after she has finished healing her venous ulcers. He suspected technical limitations with the prior right lower extremity arterial duplex. He recommended continuing compression, 30-40 mmHg along with lymph pumps. 02/05/2017 -- she is having a lot of problems with her insurance coverage both for her compression wraps and for the lymphedema pumps and my nursing staff is working  on this. She sees her cardiologist this coming week and will clarify the issue regarding clearance to use the lymph pumps. 02/19/2017 -- was seen by Dr. Yates Decamp, who worked her up appropriately and the patientoos resting EKG was normal and perfusion images demonstrated no evidence of ischemia and the left ventricle ejection fraction was 68% and this was a low risk study. He also noted on the arterial and venous lower extremity ultrasound that her arterial duplex done on 12/03/2016 showed biphasic PT on the right and biphasic except PT on the left. On the venous duplex done on 12/03/16 there was no GS reflux and there was a positive SSV reflux. 03/12/2017 -- her home health nurses have not been using the appropriate compression wraps but overall there has been good resolution in the size of the wound. The lymphedema still persists. She has been unable to obtain the lymphedema pumps yet due to insurance coverage and they may consider it after she finishes 6 months of therapy. 03/26/2017 -- she is close to healing and at this stage we are going to order some compression stockings about the 30-40 mm dual layer compression and the juxta lites. Her lymphedema pumps have not yet been authorized. 04/09/17-she is here in follow-up  evaluation for bilateral lower extremity ulcers. She is anticipating to get lymphedema pumps within the next 6 weeks. She was unable to get compression stocking/wraps, insurance did not cover. She presents today with no open areas, no weeping, minimal edema. We will discharge from wound care center with application of 4-layer compression wrap for home health to remove on Friday READMISSION 04/02/2022 Mrs. Adeyemi is a lady with chronic lymphedema. Recently she saw her primary care doctor who prescribed her over the toe and heel stockings but she was not able to apply them. Also suggested as needed Lasix apparently at 20 mg. She did have a fall about a week ago but were not really sure what started this wound however it is on the left anterior lower leg. Note that the patient was here for a similar issue in 2018. I have reviewed her past medical history she is a type II diabetic, lymphedema, obstructive sleep apnea, asthma, gastroesophageal reflux disease, hypertension, pancytopenia, CHF and chronic venous insufficiency ABI on the left was noncompressible 04/10/2022: She removed her compression wrap and replaced it with an Ace bandage due to discomfort. She did leave her silver alginate and ABD in place. The wound measured smaller today. There is some slough on the surface. Edema control is marginal. 04/24/2022: The wound is down to just 3 tiny openings. They are superficial and clean. Edema control remains marginal. Patient History Information obtained from Patient. Family History Diabetes - Mother, Heart Disease - Mother, Hypertension - Mother, No family history of Cancer, Hereditary Spherocytosis, Kidney Disease, Lung Disease, Seizures, Stroke, Thyroid Problems, Tuberculosis. Social History Former smoker - Quit 01/17/1964, Marital Status - Widowed, Alcohol Use - Never, Drug Use - No History, Caffeine Use - Never. Medical History Eyes Denies history of Cataracts, Glaucoma, Optic  Neuritis Ear/Nose/Mouth/Throat Denies history of Chronic sinus problems/congestion, Middle ear problems Hematologic/Lymphatic Patient has history of Anemia Denies history of Hemophilia, Human Immunodeficiency Virus, Lymphedema, Sickle Cell Disease Respiratory Patient has history of Asthma, Sleep Apnea Denies history of Aspiration, Chronic Obstructive Pulmonary Disease (COPD), Pneumothorax, Tuberculosis Cardiovascular Patient has history of Congestive Heart Failure, Hypertension, Peripheral Venous Disease Denies history of Angina, Arrhythmia, Coronary Artery Disease, Deep Vein Thrombosis, Hypotension, Myocardial Infarction, Peripheral Arterial Disease, Phlebitis, Vasculitis Gastrointestinal Denies  history of Cirrhosis , Colitis, Crohnoos, Hepatitis A, Hepatitis B, Hepatitis C Endocrine Patient has history of Type II Diabetes Denies history of Type I Diabetes FUMIYE, LUBBEN ([id number]) [id number] Page 6 of 9 Genitourinary Denies history of End Stage Renal Disease Immunological Denies history of Lupus Erythematosus, Raynaudoos, Scleroderma Integumentary (Skin) Denies history of History of Burn Musculoskeletal Patient has history of Osteoarthritis Denies history of Gout, Rheumatoid Arthritis, Osteomyelitis Neurologic Patient has history of Neuropathy Denies history of Dementia, Quadriplegia, Paraplegia, Seizure Disorder Oncologic Denies history of Received Chemotherapy, Received Radiation Psychiatric Denies history of Anorexia/bulimia, Confinement Anxiety Hospitalization/Surgery History - Abdominal Hysterectomy. - Cystectomy. - Thyroid Surgery. Medical A Surgical History Notes nd Constitutional Symptoms (General Health) obesity Respiratory Bronchitis Wheezing Gastrointestinal Constipation Genitourinary stage III kidney disease Musculoskeletal Unspecified Arthritis Oncologic R Breast Mass Objective Constitutional She is hypertensive, but  asymptomatic.Marland Kitchen No acute distress. Vitals Time Taken: 4:00 AM, Height: 63 in, Weight: 285 lbs, BMI: 50.5, Temperature: 98.3 F, Pulse: 88 bpm, Respiratory Rate: 20 breaths/min, Blood Pressure: 160/85 mmHg, Capillary Blood Glucose: 120 mg/dl. Respiratory Normal work of breathing on room air. General Notes: 04/24/2022: The wound is down to just 3 tiny openings. They are superficial and clean. Edema control remains marginal. Integumentary (Hair, Skin) Wound #3 status is Open. Original cause of wound was Blister. The date acquired was: 03/26/2022. The wound has been in treatment 3 weeks. The wound is located on the Left,Anterior Lower Leg. The wound measures 1.6cm length x 0.3cm width x 0.1cm depth; 0.377cm^2 area and 0.038cm^3 volume. There is Fat Layer (Subcutaneous Tissue) exposed. There is no tunneling or undermining noted. There is a medium amount of serous drainage noted. The wound margin is distinct with the outline attached to the wound base. There is large (67-100%) red granulation within the wound bed. There is a small (1-33%) amount of necrotic tissue within the wound bed including Adherent Slough. The periwound skin appearance had no abnormalities noted for texture. The periwound skin appearance exhibited: Dry/Scaly, Hemosiderin Staining. Periwound temperature was noted as No Abnormality. 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 - Dr. Lady Gary - room 2 Anesthetic: KYRSTYN, GREEAR ([id number]) [id number] Page 7 of 9 (In clinic) Topical Lidocaine 4% applied to wound bed Edema Control - Lymphedema / SCD / Other: Avoid standing for long periods of time. Exercise regularly Moisturize legs daily. WOUND #3: - Lower Leg Wound Laterality:  Left, Anterior Cleanser: Soap and Water 1 x Per Week/30 Days Discharge Instructions: May shower and wash wound with dial antibacterial soap and water prior to dressing change. Cleanser: Wound Cleanser 1 x Per Week/30 Days Discharge Instructions: Cleanse the wound with wound cleanser prior to applying a clean dressing using gauze sponges, not tissue or cotton balls. Peri-Wound Care: Sween Lotion (Moisturizing lotion) 1 x Per Week/30 Days Discharge Instructions: Apply moisturizing lotion as directed Prim Dressing: Sorbalgon AG Dressing 2x2 (in/in) 1 x Per Week/30 Days ary Discharge Instructions: Apply to wound bed as instructed Secondary Dressing: ABD Pad, 5x9 1 x Per Week/30 Days Discharge Instructions: Apply over primary dressing as directed. Secondary Dressing: Woven Gauze Sponge, Non-Sterile 4x4 in 1 x Per Week/30 Days Discharge Instructions: Apply over primary dressing as directed. Secured With: Transpore Surgical T ape, 2x10 (in/yd) 1 x Per Week/30 Days Discharge Instructions: Secure dressing with tape as directed. Com pression  Wrap: Kerlix Roll 4.5x3.1 (in/yd) 1 x Per Week/30 Days Discharge Instructions: Apply Kerlix and Coban compression as directed. Com pression Wrap: Coban Self-Adherent Wrap 4x5 (in/yd) 1 x Per Week/30 Days Discharge Instructions: Apply over Kerlix as directed. Com pression Stockings: Circaid Juxta Lite Compression Wrap (DME) Compression Amount: 30-40 mmHg (left) Compression Amount: 30-40 mmHg (right) Discharge Instructions: Apply Circaid Juxta Lite Compression Wrap daily as instructed. Apply first thing in the morning, remove at night before bed. 04/24/2022: The wound is down to just 3 tiny openings. They are superficial and clean. Edema control remains marginal. No debridement was necessary. We will continue silver alginate, Kerlix and Coban wrapping. Hopefully she will be healed in the next 1 to 2 weeks. Follow-up in 1 week's time. Electronic  Signature(s) Signed: 04/24/2022 4:22:34 PM By: Fredirick Maudlin MD FACS Entered By: Fredirick Maudlin on 04/24/2022 16:22:34 -------------------------------------------------------------------------------- HxROS Details Patient Name: Date of Service: Elizabeth [name] 04/24/2022 3:30 PM Medical Record Number: [id number] Patient Account Number: [id number] Date of Birth/Sex: Treating RN: Sep 30, 1940 (82 y.o. F) Primary Care Provider: Arthur Holms Other Clinician: Referring Provider: Treating Provider/Extender: Hollice Espy in Treatment: 3 Information Obtained From Patient Constitutional Symptoms (General Health) Medical History: Past Medical History Notes: obesity Eyes Medical History: Negative for: Cataracts; Glaucoma; Optic Neuritis Ear/Nose/Mouth/Throat Medical History: Negative for: Chronic sinus problems/congestion; Middle ear problems Hematologic/Lymphatic HADESSAH, GRENNAN ([id number]) [id number] Page 8 of 9 Medical History: Positive for: Anemia Negative for: Hemophilia; Human Immunodeficiency Virus; Lymphedema; Sickle Cell Disease Respiratory Medical History: Positive for: Asthma; Sleep Apnea Negative for: Aspiration; Chronic Obstructive Pulmonary Disease (COPD); Pneumothorax; Tuberculosis Past Medical History Notes: Bronchitis Wheezing Cardiovascular Medical History: Positive for: Congestive Heart Failure; Hypertension; Peripheral Venous Disease Negative for: Angina; Arrhythmia; Coronary Artery Disease; Deep Vein Thrombosis; Hypotension; Myocardial Infarction; Peripheral Arterial Disease; Phlebitis; Vasculitis Gastrointestinal Medical History: Negative for: Cirrhosis ; Colitis; Crohns; Hepatitis A; Hepatitis B; Hepatitis C Past Medical History Notes: Constipation Endocrine Medical History: Positive for: Type II Diabetes Negative for: Type I Diabetes Time with diabetes: 15 years Treated with: Oral agents Blood sugar  tested every day: Yes Tested : 2x day Genitourinary Medical History: Negative for: End Stage Renal Disease Past Medical History Notes: stage III kidney disease Immunological Medical History: Negative for: Lupus Erythematosus; Raynauds; Scleroderma Integumentary (Skin) Medical History: Negative for: History of Burn Musculoskeletal Medical History: Positive for: Osteoarthritis Negative for: Gout; Rheumatoid Arthritis; Osteomyelitis Past Medical History Notes: Unspecified Arthritis Neurologic Medical History: Positive for: Neuropathy Negative for: Dementia; Quadriplegia; Paraplegia; Seizure Disorder Oncologic Medical History: Negative for: Received Chemotherapy; Received Radiation Past Medical History Notes: R Breast Mass Psychiatric Medical History: Negative for: Anorexia/bulimia; Confinement Anxiety Immunizations MARJORIA, MANCILLAS ([id number]) [id number] Page 9 of 9 Pneumococcal Vaccine: Received Pneumococcal Vaccination: Yes Received Pneumococcal Vaccination On or After 60th Birthday: Yes Implantable Devices No devices added Hospitalization / Surgery History Type of Hospitalization/Surgery Abdominal Hysterectomy Cystectomy Thyroid Surgery Family and Social History Cancer: No; Diabetes: Yes - Mother; Heart Disease: Yes - Mother; Hereditary Spherocytosis: No; Hypertension: Yes - Mother; Kidney Disease: No; Lung Disease: No; Seizures: No; Stroke: No; Thyroid Problems: No; Tuberculosis: No; Former smoker - Quit 01/17/1964; Marital Status - Widowed; Alcohol Use: Never; Drug Use: No History; Caffeine Use: Never; Financial Concerns: No; Food, Clothing or Shelter Needs: No; Support System Lacking: No; Transportation Concerns: No Electronic Signature(s) Signed: 04/24/2022 4:24:49 PM By: Fredirick Maudlin MD FACS Entered By: Fredirick Maudlin on 04/24/2022  16:21:15 -------------------------------------------------------------------------------- SuperBill Details Patient Name: Date of Service: Elizabeth [name]. 04/24/2022  Medical Record Number: [id number] Patient Account Number: [id number] Date of Birth/Sex: Treating RN: 1940-05-05 (82 y.o. 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 I89.0 Lymphedema, not elsewhere classified E11.622 Type 2 diabetes mellitus with other skin ulcer Facility Procedures : CPT4 Code: [id number] Description: 99213 - WOUND CARE VISIT-LEV 3 EST PT Modifier: Quantity: 1 Physician Procedures : CPT4 Code Description Modifier [id number] - WC PHYS LEVEL 3 - EST PT ICD-10 Diagnosis Description L97.928 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

## 2022-05-02 ENCOUNTER — Ambulatory Visit (HOSPITAL_BASED_OUTPATIENT_CLINIC_OR_DEPARTMENT_OTHER): Payer: Medicare Other | Admitting: General Surgery

## 2022-05-09 ENCOUNTER — Encounter (HOSPITAL_BASED_OUTPATIENT_CLINIC_OR_DEPARTMENT_OTHER): Payer: 59 | Admitting: General Surgery

## 2022-05-09 ENCOUNTER — Ambulatory Visit (HOSPITAL_BASED_OUTPATIENT_CLINIC_OR_DEPARTMENT_OTHER): Payer: Medicare Other | Admitting: General Surgery

## 2022-05-17 ENCOUNTER — Encounter (HOSPITAL_BASED_OUTPATIENT_CLINIC_OR_DEPARTMENT_OTHER): Payer: 59 | Admitting: General Surgery

## 2022-05-17 NOTE — Progress Notes (Signed)
Elizabeth [name] ([id number]) [id number] Page 1 of 9 Visit Report for 05/17/2022 Chief Complaint Document Details Patient Name: Date of Service: Elizabeth [name], Elizabeth [name] 05/17/2022 12:45 PM Medical Record Number: [id number] Patient Account Number: [id number] Date of Birth/Sex: Treating RN: [birth year] (82 y.o. F) Primary Care Provider: Loura Back Other Clinician: Referring Provider: Treating Provider/Extender: Rowe Pavy in Treatment: 0 Information Obtained from: Patient Chief Complaint she is here in follow-up evaluation for bilateral lower extremity ulcers Electronic Signature(s) Signed: 05/17/2022 1:29:56 PM By: Duanne Guess MD FACS Entered By: Duanne Guess on 05/17/2022 13:29:56 -------------------------------------------------------------------------------- HPI Details Patient Name: Date of Service: Elizabeth [name] 05/17/2022 12:45 PM Medical Record Number: [id number] Patient Account Number: [id number] Date of Birth/Sex: Treating RN: 02/04/1941 (82 y.o. F) Primary Care Provider: Loura Back Other Clinician: Referring Provider: Treating Provider/Extender: Rowe Pavy in Treatment: 0 History of Present Illness Location: bilateral lower extremity swelling with ulceration Quality: Patient reports experiencing a dull pain to affected area(s). Severity: Patient states wound(s) are getting worse. Duration: Patient has had the wound for > 3 months prior to seeking treatment at the wound center Timing: Pain in wound is Intermittent (comes and goes Context: The wound would happen gradually Modifying Factors: Patient wound(s)/ulcer(s) are improving due to:as recently in the ER and she has been put on antibiotics and Lasix ssociated Signs and Symptoms: Patient reports having increase swelling. A HPI Description: 82 year old patient who has diabetes mellitus, hypertension, bronchitis, sleep apnea, morbid obesity was  recently seen by her PCP and the ER and has been worked up for bilateral lower extremity lymphedema. The daughter and the patient says that she has had large legs for a long while and most recently she has been put on an antibiotics which was doxycycline and Lasix by her PCP and because of progressive problems she went to the ER recently on 11/19/2016. She was placed and Unna's boots and referred to the wound center. Past medical history significant for surgical removal of of her thyroid, abdominal hysterectomy. She does not smoke cigarettes. Recent x-ray of the chest showed cardiac enlargement with no evidence of active pulmonary disease. Her glucose was 120, BUN was 21 and creatinine is 1.06 and albumin was 3.3. We do not have any notes from her PCP 12/04/2016 -- she had a lower extremity arterial duplex evaluation which could not be done due to large weeping ulcers on bilateral lower legs. Peak systolic velocity and ratio in the right popliteal arteries consistent with a 50-74% stenosis. Her venous duplex study was done yesterday but the report is not in. 12/10/2016 -- Lower Extremity venous duplex reflux evaluation was done on 12/03/2016 and it showed reflux in bilateral common femoral veins and reflux in the right small saphenous vein midsegment and reflux in the left saphenofemoral junction with no reflux visualized in the great saphenous vein or small saphenous vein. It was technically difficult exam due to body habitus. With this result she warrants a vascular consultation regarding possible intervention 12/17/2016 -- lab work reviewed showed BNP, glucose was 111 and BUN was 14 and creatinine was 0.99. Liver function tests within normal limits. A1c as per the verbal report from the patient's daughter was 6.8% 01/28/2017 -- was seen by Dr. Leonides Sake on 01/17/2017. Noninvasive vascular imaging with bilateral lower extremity venous insufficiency duplex was reviewed and bilateral lower  extremity arterial duplex was reviewed and his impression was that she had bilateral chronic venous insufficiency C5 and phlebolith lymphedema bilaterally. He recommended a repeat  bilateral lower extremity ABI in 6 months after she has finished healing her venous ulcers. He suspected Elizabeth [name] ([id number]) [id number] Page 2 of 9 technical limitations with the prior right lower extremity arterial duplex. He recommended continuing compression, 30-40 mmHg along with lymph pumps. 02/05/2017 -- she is having a lot of problems with her insurance coverage both for her compression wraps and for the lymphedema pumps and my nursing staff is working on this. She sees her cardiologist this coming week and will clarify the issue regarding clearance to use the lymph pumps. 02/19/2017 -- was seen by Dr. Adrian Prows, who worked her up appropriately and the patients resting EKG was normal and perfusion images demonstrated no evidence of ischemia and the left ventricle ejection fraction was 68% and this was a low risk study. He also noted on the arterial and venous lower extremity ultrasound that her arterial duplex done on 12/03/2016 showed biphasic PT on the right and biphasic except PT on the left. On the venous duplex done on 12/03/16 there was no GS reflux and there was a positive SSV reflux. 03/12/2017 -- her home health nurses have not been using the appropriate compression wraps but overall there has been good resolution in the size of the wound. The lymphedema still persists. She has been unable to obtain the lymphedema pumps yet due to insurance coverage and they may consider it after she finishes 6 months of therapy. 03/26/2017 -- she is close to healing and at this stage we are going to order some compression stockings about the 30-40 mm dual layer compression and the juxta lites. Her lymphedema pumps have not yet been authorized. 04/09/17-she is here in follow-up evaluation  for bilateral lower extremity ulcers. She is anticipating to get lymphedema pumps within the next 6 weeks. She was unable to get compression stocking/wraps, insurance did not cover. She presents today with no open areas, no weeping, minimal edema. We will discharge from wound care center with application of 4-layer compression wrap for home health to remove on Friday READMISSION 04/02/2022 Elizabeth [name] is a lady with chronic lymphedema. Recently she saw her primary care doctor who prescribed her over the toe and heel stockings but she was not able to apply them. Also suggested as needed Lasix apparently at 20 mg. She did have a fall about a week ago but were not really sure what started this wound however it is on the left anterior lower leg. Note that the patient was here for a similar issue in 2018. I have reviewed her past medical history she is a type II diabetic, lymphedema, obstructive sleep apnea, asthma, gastroesophageal reflux disease, hypertension, pancytopenia, CHF and chronic venous insufficiency ABI on the left was noncompressible 04/10/2022: She removed her compression wrap and replaced it with an Ace bandage due to discomfort. She did leave her silver alginate and ABD in place. The wound measured smaller today. There is some slough on the surface. Edema control is marginal. 04/24/2022: The wound is down to just 3 tiny openings. They are superficial and clean. Edema control remains marginal. 05/17/2022: Her wound is completely healed. She brought the stocking component of the juxta lites with her but not the actual wraps. Her right leg is swollen but there are no ulcers. Electronic Signature(s) Signed: 05/17/2022 1:31:12 PM By: Fredirick Maudlin MD FACS Entered By: Fredirick Maudlin on 05/17/2022 13:31:12 -------------------------------------------------------------------------------- Physical Exam Details Patient Name: Date of Service: Elizabeth [name] 05/17/2022 12:45 PM Medical  Record Number: [id number] Patient Account Number:  [id number] Date of Birth/Sex: Treating RN: [birth year] (82 y.o. F) Primary Care Provider: Arthur Holms Other Clinician: Referring Provider: Treating Provider/Extender: Hollice Espy in Treatment: 0 Constitutional no acute distress. Respiratory Normal work of breathing on room air. Notes 05/17/2022: Her wounds are healed. Electronic Signature(s) Signed: 05/17/2022 1:31:38 PM By: Fredirick Maudlin MD FACS Entered By: Fredirick Maudlin on 05/17/2022 13:31:38 Elizabeth [name] ([id number]) [id number] Page 3 of 9 -------------------------------------------------------------------------------- Physician Orders Details Patient Name: Date of Service: AISHAT, WOLLIN 05/17/2022 12:45 PM Medical Record Number: [id number] Patient Account Number: [id number] Date of Birth/Sex: Treating RN: 1941/01/25 (82 y.o. 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 Your wounds are healed! Electronic Signature(s) Signed: 05/17/2022 1:31:47 PM By: Fredirick Maudlin MD FACS Entered By: Fredirick Maudlin on 05/17/2022 13:31:46 -------------------------------------------------------------------------------- Problem List Details Patient Name: Date of Service: Elizabeth [name] 05/17/2022 12:45 PM Medical Record Number: [id number] Patient Account Number: [id number] Date of Birth/Sex: Treating RN: 04-24-1940 (82 y.o. 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 with other 04/02/2022 No Yes specified severity I89.0 Lymphedema, not elsewhere classified 04/02/2022 No Yes E11.622 Type 2 diabetes mellitus with other skin ulcer 04/02/2022 No Yes Inactive Problems Resolved Problems Electronic Signature(s) FAY, GLATT ([id number]) [id number] Page 4 of 9 Signed: 05/17/2022 1:29:43 PM By: Fredirick Maudlin MD FACS Entered By: Fredirick Maudlin on 05/17/2022 13:29:43 -------------------------------------------------------------------------------- Progress Note Details Patient Name: Date of Service: Elizabeth [name] 05/17/2022 12:45 PM Medical Record Number: [id number] Patient Account Number: [id number] Date of Birth/Sex: Treating RN: [birth year] (82 y.o. F) Primary Care Provider: Arthur Holms Other Clinician: Referring Provider: Treating Provider/Extender: Hollice Espy in Treatment: 0 Subjective Chief Complaint Information obtained from Patient she is here in follow-up evaluation for bilateral lower extremity ulcers History of Present Illness (HPI) The following HPI elements were documented for the patient's wound: Location: bilateral lower extremity swelling with ulceration Quality: Patient reports experiencing a dull pain to affected area(s). Severity: Patient states wound(s) are getting worse. Duration: Patient has had the wound for > 3 months prior to seeking treatment at the wound center Timing: Pain in wound is Intermittent (comes and goes Context: The wound would happen gradually Modifying Factors: Patient wound(s)/ulcer(s) are improving due to:as recently in the ER and she has been put on antibiotics and Lasix Associated  Signs and Symptoms: Patient reports having increase swelling. 82 year old patient who has diabetes mellitus, hypertension, bronchitis, sleep apnea, morbid obesity was recently seen by her PCP and the ER and has been worked up for bilateral lower extremity lymphedema. The daughter and the patient says that she has had large legs for a long while and most recently she has been put on an antibiotics which was doxycycline and Lasix by her PCP and because of progressive problems she went to the ER recently on 11/19/2016. She was placed and Unna's boots and referred to the wound center. Past medical history significant for surgical removal  of of her thyroid, abdominal hysterectomy. She does not smoke cigarettes. Recent x-ray of the chest showed cardiac enlargement with no evidence of active pulmonary disease. Her glucose was 120, BUN was 21 and creatinine is 1.06 and albumin was 3.3. We do not have any notes from her PCP 12/04/2016 -- she had a lower extremity arterial duplex evaluation which could not be done due to large weeping ulcers on bilateral lower legs. Peak systolic velocity and ratio in the right popliteal arteries consistent with a 50-74% stenosis. Her venous duplex study was done yesterday but the report is not in. 12/10/2016 -- Lower Extremity venous duplex reflux evaluation was done on 12/03/2016 and it showed reflux in bilateral common femoral veins and reflux in the right small saphenous vein midsegment and reflux in the left saphenofemoral junction with no reflux visualized in the great saphenous vein or small saphenous vein. It was technically difficult exam due to body habitus. With this result she warrants a vascular consultation regarding possible intervention 12/17/2016 -- lab work reviewed showed BNP, glucose was 111 and BUN was 14 and creatinine was 0.99. Liver function tests within normal limits. A1c as per the verbal report from the patient's daughter was 6.8% 01/28/2017 -- was  seen by Dr. Adele Barthel on 01/17/2017. Noninvasive vascular imaging with bilateral lower extremity venous insufficiency duplex was reviewed and bilateral lower extremity arterial duplex was reviewed and his impression was that she had bilateral chronic venous insufficiency C5 and phlebolith lymphedema bilaterally. He recommended a repeat bilateral lower extremity ABI in 6 months after she has finished healing her venous ulcers. He suspected technical limitations with the prior right lower extremity arterial duplex. He recommended continuing compression, 30-40 mmHg along with lymph pumps. 02/05/2017 -- she is having a lot of problems with her insurance coverage both for her compression wraps and for the lymphedema pumps and my nursing staff is working on this. She sees her cardiologist this coming week and will clarify the issue regarding clearance to use the lymph pumps. 02/19/2017 -- was seen by Dr. Adrian Prows, who worked her up appropriately and the patientoos resting EKG was normal and perfusion images demonstrated no evidence of ischemia and the left ventricle ejection fraction was 68% and this was a low risk study. He also noted on the arterial and venous lower extremity ultrasound that her arterial duplex done on 12/03/2016 showed biphasic PT on the right and biphasic except PT on the left. On the venous duplex done on 12/03/16 there was no GS reflux and there was a positive SSV reflux. 03/12/2017 -- her home health nurses have not been using the appropriate compression wraps but overall there has been good resolution in the size of the wound. The lymphedema still persists. She has been unable to obtain the lymphedema pumps yet due to insurance coverage and they may consider it after she finishes 6 months of therapy. 03/26/2017 -- she is close to healing and at this stage we are going to order some compression stockings about the 30-40 mm dual layer compression and the juxta lites. Her lymphedema  pumps have not yet been authorized. 04/09/17-she is here in follow-up evaluation for bilateral lower extremity ulcers. She is anticipating to get lymphedema pumps within the next 6 weeks. She was unable to get compression stocking/wraps, insurance did not cover. She presents today with no open areas, no weeping, minimal edema. We will discharge from wound care center with application of 4-layer compression wrap for home health to remove on Friday  READMISSION 04/02/2022 Elizabeth [name] is a lady with chronic lymphedema. Recently she saw her primary care doctor who prescribed her over the toe and heel stockings but she was not able to apply them. Also suggested as needed Lasix apparently at 20 mg. She did have a fall about a week ago but were not really sure what started this wound however it is on the left anterior lower leg. Note that the patient was here for a similar issue in 2018. Elizabeth [name], Elizabeth [name] ([id number]) [id number] Page 5 of 9 I have reviewed her past medical history she is a type II diabetic, lymphedema, obstructive sleep apnea, asthma, gastroesophageal reflux disease, hypertension, pancytopenia, CHF and chronic venous insufficiency ABI on the left was noncompressible 04/10/2022: She removed her compression wrap and replaced it with an Ace bandage due to discomfort. She did leave her silver alginate and ABD in place. The wound measured smaller today. There is some slough on the surface. Edema control is marginal. 04/24/2022: The wound is down to just 3 tiny openings. They are superficial and clean. Edema control remains marginal. 05/17/2022: Her wound is completely healed. She brought the stocking component of the juxta lites with her but not the actual wraps. Her right leg is swollen but there are no ulcers. Patient History Information obtained from Patient. Family History Diabetes - Mother, Heart Disease - Mother, Hypertension - Mother, No family history of  Cancer, Hereditary Spherocytosis, Kidney Disease, Lung Disease, Seizures, Stroke, Thyroid Problems, Tuberculosis. Social History Former smoker - Quit 01/17/1964, Marital Status - Widowed, Alcohol Use - Never, Drug Use - No History, Caffeine Use - Never. Medical History Eyes Denies history of Cataracts, Glaucoma, Optic Neuritis Ear/Nose/Mouth/Throat Denies history of Chronic sinus problems/congestion, Middle ear problems Hematologic/Lymphatic Patient has history of Anemia Denies history of Hemophilia, Human Immunodeficiency Virus, Lymphedema, Sickle Cell Disease Respiratory Patient has history of Asthma, Sleep Apnea Denies history of Aspiration, Chronic Obstructive Pulmonary Disease (COPD), Pneumothorax, Tuberculosis Cardiovascular Patient has history of Congestive Heart Failure, Hypertension, Peripheral Venous Disease Denies history of Angina, Arrhythmia, Coronary Artery Disease, Deep Vein Thrombosis, Hypotension, Myocardial Infarction, Peripheral Arterial Disease, Phlebitis, Vasculitis Gastrointestinal Denies history of Cirrhosis , Colitis, Crohnoos, Hepatitis A, Hepatitis B, Hepatitis C Endocrine Patient has history of Type II Diabetes Denies history of Type I Diabetes Genitourinary Denies history of End Stage Renal Disease Immunological Denies history of Lupus Erythematosus, Raynaudoos, Scleroderma Integumentary (Skin) Denies history of History of Burn Musculoskeletal Patient has history of Osteoarthritis Denies history of Gout, Rheumatoid Arthritis, Osteomyelitis Neurologic Patient has history of Neuropathy Denies history of Dementia, Quadriplegia, Paraplegia, Seizure Disorder Oncologic Denies history of Received Chemotherapy, Received Radiation Psychiatric Denies history of Anorexia/bulimia, Confinement Anxiety Hospitalization/Surgery History - Abdominal Hysterectomy. - Cystectomy. - Thyroid Surgery. Medical A Surgical History Notes nd Constitutional Symptoms (General  Health) obesity Respiratory Bronchitis Wheezing Gastrointestinal Constipation Genitourinary stage III kidney disease Musculoskeletal Unspecified Arthritis Oncologic R Breast Mass Objective Constitutional Elizabeth [name], Elizabeth [name] ([id number]) [id number] Page 6 of 9 no acute distress. Vitals Time Taken: 1:01 AM, Height: 63 in, Weight: 285 lbs, BMI: 50.5, Temperature: 97.8 F, Respiratory Rate: 20 breaths/min. Respiratory Normal work of breathing on room air. General Notes: 05/17/2022: Her wounds are healed. Integumentary (Hair, Skin) Wound #3 status is Healed - Epithelialized. Original cause of wound was Blister. The date acquired was: 03/26/2022. The wound has been in treatment 6 weeks. The wound is located on the Left,Anterior Lower Leg. The wound measures 0cm length x 0cm width x 0cm depth; 0cm^2 area and 0cm^3 volume.  There is no tunneling or undermining noted. There is a none present amount of drainage noted. The wound margin is distinct with the outline attached to the wound base. There is no granulation within the wound bed. There is no necrotic tissue within the wound bed. The periwound skin appearance had no abnormalities noted for texture. The periwound skin appearance had no abnormalities noted for moisture. The periwound skin appearance had no abnormalities noted for color. Periwound temperature was noted as No Abnormality. 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 Your wounds are healed! 05/17/2022: Her wounds are all healed on the left leg. She brought the stocking component of her juxta lite with her but not the wrap. She will apply it at home. She does not have any wounds on the right leg but her  leg is fairly swollen. We measured her for compression stockings and gave her the information for elastic therapy in [place] so she can get an appropriately sized and fitted compression stocking for this leg. At this point, she can be discharged from the wound care center and she may follow-up as needed. Electronic Signature(s) Signed: 05/17/2022 1:32:41 PM By: Fredirick Maudlin MD FACS Entered By: Fredirick Maudlin on 05/17/2022 13:32:41 -------------------------------------------------------------------------------- HxROS Details Patient Name: Date of Service: Elizabeth [name] 05/17/2022 12:45 PM Medical Record Number: [id number] Patient Account Number: [id number] Date of Birth/Sex: Treating RN: 1941/01/06 (82 y.o. F) Primary Care Provider: Arthur Holms Other Clinician: Referring Provider: Treating Provider/Extender: Hollice Espy in Treatment: 0 Information Obtained From Patient Constitutional Symptoms ([place]) Medical History: Past Medical History Notes: obesity Eyes ALYSSANDRA, [place] ([id number]) [id number] Page 7 of 9 Medical History: Negative for: Cataracts; Glaucoma; Optic Neuritis Ear/Nose/Mouth/Throat Medical History: Negative for: Chronic sinus problems/congestion; Middle ear problems Hematologic/Lymphatic Medical History: Positive for: Anemia Negative for: Hemophilia; Human Immunodeficiency Virus; Lymphedema; Sickle Cell Disease Respiratory Medical History: Positive for: Asthma; Sleep Apnea Negative for: Aspiration; Chronic Obstructive Pulmonary Disease (COPD); Pneumothorax; Tuberculosis Past Medical History Notes: Bronchitis Wheezing Cardiovascular Medical History: Positive for: Congestive Heart Failure; Hypertension; Peripheral Venous Disease Negative for: Angina; Arrhythmia; Coronary Artery Disease; Deep Vein Thrombosis; Hypotension; Myocardial Infarction; Peripheral Arterial Disease; Phlebitis;  Vasculitis Gastrointestinal Medical History: Negative for: Cirrhosis ; Colitis; Crohns; Hepatitis A; Hepatitis B; Hepatitis C Past Medical History Notes: Constipation Endocrine Medical History: Positive for: Type II Diabetes Negative for: Type I Diabetes Time with diabetes: 15 years Treated with: Oral agents Blood sugar tested every day: Yes Tested : 2x day Genitourinary Medical History: Negative for: End Stage Renal Disease Past Medical History Notes: stage III kidney disease Immunological Medical History: Negative for: Lupus Erythematosus; Raynauds; Scleroderma Integumentary (Skin) Medical History: Negative for: History of Burn Musculoskeletal Medical History: Positive for: Osteoarthritis Negative for: Gout; Rheumatoid Arthritis; Osteomyelitis Past Medical History Notes: Unspecified Arthritis Neurologic Medical History: Positive for: Neuropathy Negative for: Dementia; Quadriplegia; Paraplegia; Seizure Disorder Oncologic Medical History: ALEXIANA, LAVERDURE ([id number]) [id number] Page 8 of 9 Negative for: Received Chemotherapy; Received Radiation Past Medical History Notes: R Breast Mass Psychiatric Medical History: Negative for: Anorexia/bulimia; Confinement Anxiety Immunizations Pneumococcal Vaccine: Received Pneumococcal Vaccination: Yes Received Pneumococcal Vaccination On or After 60th Birthday: Yes Implantable Devices No devices added Hospitalization / Surgery History Type of Hospitalization/Surgery Abdominal Hysterectomy Cystectomy Thyroid Surgery Family and Social History Cancer: No; Diabetes: Yes -  Mother; Heart Disease: Yes - Mother; Hereditary Spherocytosis: No; Hypertension: Yes - Mother; Kidney Disease: No; Lung Disease: No; Seizures: No; Stroke: No; Thyroid Problems: No; Tuberculosis: No; Former smoker - Quit 01/17/1964; Marital Status - Widowed; Alcohol Use: Never; Drug Use: No History; Caffeine Use: Never; Financial  Concerns: No; Food, Clothing or Shelter Needs: No; Support System Lacking: No; Transportation Concerns: No Electronic Signature(s) Signed: 05/17/2022 3:31:20 PM By: Fredirick Maudlin MD FACS Entered By: Fredirick Maudlin on 05/17/2022 13:31:20 -------------------------------------------------------------------------------- SuperBill Details Patient Name: Date of Service: Elizabeth [name] 05/17/2022 Medical Record Number: [id number] Patient Account Number: [id number] Date of Birth/Sex: Treating RN: Sep 14, 1940 (82 y.o. 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

## 2022-05-17 NOTE — Progress Notes (Signed)
AILEANA, HODDER ([id number]) [id number] Page 1 of 8 Visit Report for 05/17/2022 Arrival Information Details Patient Name: Date of Service: Elizabeth [name], Elizabeth [name] 05/17/2022 12:45 PM Medical Record Number: [id number] Patient Account Number: [id number] Date of Birth/Sex: Treating RN: 1940/12/21 (82 y.o. F) Primary Care Arva Slaugh: Arthur Holms Other Clinician: Referring Yitzchok Carriger: Treating Jadae Steinke/Extender: Hollice Espy in Treatment: 0 Visit Information History Since Last Visit All ordered tests and consults were completed: No Patient Arrived: Gilford Rile Added or deleted any medications: No Arrival Time: 13:01 Any new allergies or adverse reactions: No Accompanied By: daughter Had a fall or experienced change in No Transfer Assistance: Manual activities of daily living that may affect Patient Identification Verified: Yes risk of falls: Secondary Verification Process Completed: Yes Signs or symptoms of abuse/neglect since last visito No Patient Has Alerts: Yes Hospitalized since last visit: No Patient Alerts: L ABI: Bellamy in clinic Implantable device outside of the clinic excluding No cellular tissue based products placed in the center since last visit: Pain Present Now: No Electronic Signature(s) Signed: 05/17/2022 1:35:26 PM By: Worthy Rancher Entered By: Worthy Rancher on 05/17/2022 13:01:35 -------------------------------------------------------------------------------- Clinic Level of Care Assessment Details Patient Name: Date of Service: Elizabeth [name], Elizabeth [name] 05/17/2022 12:45 PM Medical Record Number: [id number] Patient Account Number: [id number] Date of Birth/Sex: Treating RN: 04-Jul-1940 (82 y.o. America Brown Primary Care Garreth Burnsworth: Arthur Holms Other Clinician: Referring Amarise Lillo: Treating Hasana Alcorta/Extender: Hollice Espy in Treatment: 0 Clinic Level of Care Assessment Items TOOL 4 Quantity Score X- 1 0 Use when only an  EandM is performed on FOLLOW-UP visit ASSESSMENTS - Nursing Assessment / Reassessment X- 1 10 Reassessment of Co-morbidities (includes updates in patient status) X- 1 5 Reassessment of Adherence to Treatment Plan ASSESSMENTS - Wound and Skin A ssessment / Reassessment X - Simple Wound Assessment / Reassessment - one wound 1 5 []  - 0 Complex Wound Assessment / Reassessment - multiple wounds []  - 0 Dermatologic / Skin Assessment (not related to wound area) ASSESSMENTS - Focused Assessment X- 1 5 Circumferential Edema Measurements - multi extremities []  - 0 Nutritional Assessment / Counseling / Intervention Elizabeth [name], Elizabeth [name] ([id number]) [id number] Page 2 of 8 []  - 0 Lower Extremity Assessment (monofilament, tuning fork, pulses) []  - 0 Peripheral Arterial Disease Assessment (using hand held doppler) ASSESSMENTS - Ostomy and/or Continence Assessment and Care []  - 0 Incontinence Assessment and Management []  - 0 Ostomy Care Assessment and Management (repouching, etc.) PROCESS - Coordination of Care X - Simple Patient / Family Education for ongoing care 1 15 []  - 0 Complex (extensive) Patient / Family Education for ongoing care X- 1 10 Staff obtains Programmer, systems, Records, T Results / Process Orders est X- 1 10 Staff telephones HHA, Nursing Homes / Clarify orders / etc []  - 0 Routine Transfer to another Facility (non-emergent condition) []  - 0 Routine Hospital Admission (non-emergent condition) []  - 0 New Admissions / Biomedical engineer / Ordering NPWT Apligraf, etc. , []  - 0 Emergency Hospital Admission (emergent condition) X- 1 10 Simple Discharge Coordination []  - 0 Complex (extensive) Discharge Coordination PROCESS - Special Needs []  - 0 Pediatric / Minor Patient Management []  - 0 Isolation Patient Management []  - 0 Hearing / Language / Visual special needs []  - 0 Assessment of Community assistance (transportation, D/C planning,  etc.) []  - 0 Additional assistance / Altered mentation []  - 0 Support Surface(s) Assessment (bed, cushion, seat, etc.) INTERVENTIONS - Wound Cleansing / Measurement X - Simple Wound Cleansing - one wound  1 5 []  - 0 Complex Wound Cleansing - multiple wounds X- 1 5 Wound Imaging (photographs - any number of wounds) []  - 0 Wound Tracing (instead of photographs) X- 1 5 Simple Wound Measurement - one wound []  - 0 Complex Wound Measurement - multiple wounds INTERVENTIONS - Wound Dressings []  - 0 Small Wound Dressing one or multiple wounds []  - 0 Medium Wound Dressing one or multiple wounds []  - 0 Large Wound Dressing one or multiple wounds []  - 0 Application of Medications - topical []  - 0 Application of Medications - injection INTERVENTIONS - Miscellaneous []  - 0 External ear exam []  - 0 Specimen Collection (cultures, biopsies, blood, body fluids, etc.) []  - 0 Specimen(s) / Culture(s) sent or taken to Lab for analysis []  - 0 Patient Transfer (multiple staff / Civil Service fast streamer / Similar devices) []  - 0 Simple Staple / Suture removal (25 or less) []  - 0 Complex Staple / Suture removal (26 or more) []  - 0 Hypo / Hyperglycemic Management (close monitor of Blood Glucose) Elizabeth [name], Elizabeth [name] ([id number]) [id number] Page 3 of 8 []  - 0 Ankle / Brachial Index (ABI) - do not check if billed separately X- 1 5 Vital Signs Has the patient been seen at the hospital within the last three years: Yes Total Score: 90 Level Of Care: New/Established - Level 3 Electronic Signature(s) Signed: 05/17/2022 4:47:02 PM By: Dellie Catholic RN Entered By: Dellie Catholic on 05/17/2022 16:36:42 -------------------------------------------------------------------------------- Encounter Discharge Information Details Patient Name: Date of Service: Elizabeth [name] 05/17/2022 12:45 PM Medical Record Number: [id number] Patient Account Number: [id number] Date of Birth/Sex: Treating  RN: 1940-05-18 (82 y.o. America Brown Primary Care Kourtnei Rauber: Arthur Holms Other Clinician: Referring Flay Ghosh: Treating Disney Ruggiero/Extender: Hollice Espy in Treatment: 0 Encounter Discharge Information Items Discharge Condition: Stable Ambulatory Status: Walker Discharge Destination: Home Transportation: Private Auto Accompanied By: Daughter Schedule Follow-up Appointment: Yes Clinical Summary of Care: Patient Declined Electronic Signature(s) Signed: 05/17/2022 4:47:02 PM By: Dellie Catholic RN Entered By: Dellie Catholic on 05/17/2022 16:37:25 -------------------------------------------------------------------------------- Lower Extremity Assessment Details Patient Name: Date of Service: Elizabeth [name], Elizabeth [name] 05/17/2022 12:45 PM Medical Record Number: [id number] Patient Account Number: [id number] Date of Birth/Sex: Treating RN: 06/22/1940 (82 y.o. America Brown Primary Care Remedios Mckone: Arthur Holms Other Clinician: Referring Giancarlos Berendt: Treating Haddy Mullinax/Extender: Hollice Espy in Treatment: 0 Edema Assessment Assessed: [Left: No] [Right: No] [Left: Edema] [Right: :] Calf Left: Right: Point of Measurement: 35 cm From Medial Instep 49 cm 48 cm Ankle Left: Right: Point of Measurement: 10 cm From Medial Instep 32 cm 32.1 cm Knee To Floor Left: Right: CIENA, SAMPLEY ([id number]) [id number] Page 4 of 8 From Medial Instep 37 cm 37 cm Vascular Assessment Pulses: Dorsalis Pedis Palpable: [Left:Yes] [Right:Yes] Electronic Signature(s) Signed: 05/17/2022 4:47:02 PM By: Dellie Catholic RN Entered By: Dellie Catholic on 05/17/2022 13:05:47 -------------------------------------------------------------------------------- Multi Wound Chart Details Patient Name: Date of Service: Elizabeth [name] 05/17/2022 12:45 PM Medical Record Number: [id number] Patient Account Number: [id number] Date of Birth/Sex: Treating  RN: [birth year] (82 y.o. F) Primary Care Therman Hughlett: Arthur Holms Other Clinician: Referring Jayzen Paver: Treating Jewelia Bocchino/Extender: Hollice Espy in Treatment: 0 Vital Signs Height(in): 63 Pulse(bpm): Weight(lbs): 536 Blood Pressure(mmHg): Body Mass Index(BMI): 50.5 Temperature(F): 97.8 Respiratory Rate(breaths/min): 20 [3:Photos:] [N/A:N/A] Left, Anterior Lower Leg N/A N/A Wound Location: Blister N/A N/A Wounding Event: Lymphedema N/A N/A Primary Etiology: Anemia, Asthma, Sleep Apnea, N/A N/A Comorbid History: Congestive Heart Failure, Hypertension, Peripheral Venous Disease, Type II Diabetes, Osteoarthritis,  Neuropathy 03/26/2022 N/A N/A Date Acquired: 6 N/A N/A Weeks of Treatment: Healed - Epithelialized N/A N/A Wound Status: No N/A N/A Wound Recurrence: 0x0x0 N/A N/A Measurements L x W x D (cm) 0 N/A N/A A (cm) : rea 0 N/A N/A Volume (cm) : 100.00% N/A N/A % Reduction in Area: 100.00% N/A N/A % Reduction in Volume: Full Thickness Without Exposed N/A N/A Classification: Support Structures None Present N/A N/A Exudate Amount: Distinct, outline attached N/A N/A Wound Margin: None Present (0%) N/A N/A Granulation Amount: None Present (0%) N/A N/A Necrotic Amount: Fascia: No N/A N/A Exposed Structures: Fat Layer (Subcutaneous Tissue): No Tendon: No Muscle: No Joint: No Bone: No None N/A N/A EpithelializationGUILLERMINA, Elizabeth [name] ([id number]) [id number] Page 5 of 8 Excoriation: No N/A N/A Periwound Skin Texture: Induration: No Callus: No Crepitus: No Rash: No Scarring: No Maceration: No N/A N/A Periwound Skin Moisture: Dry/Scaly: No Atrophie Blanche: No N/A N/A Periwound Skin Color: Cyanosis: No Ecchymosis: No Erythema: No Hemosiderin Staining: No Mottled: No Pallor: No Rubor: No No Abnormality N/A N/A Temperature: Treatment Notes Electronic Signature(s) Signed: 05/17/2022 1:29:49 PM By:  Duanne Guess MD FACS Entered By: Duanne Guess on 05/17/2022 13:29:49 -------------------------------------------------------------------------------- Multi-Disciplinary Care Plan Details Patient Name: Date of Service: Elizabeth [name] 05/17/2022 12:45 PM Medical Record Number: [id number] Patient Account Number: [id number] Date of Birth/Sex: Treating RN: Jan 31, 1941 (82 y.o. Katrinka Blazing Primary Care Maliek Schellhorn: Loura Back Other Clinician: Referring Sharia Averitt: Treating Amear Strojny/Extender: Rowe Pavy in Treatment: 0 Active Inactive Electronic Signature(s) Signed: 05/17/2022 4:47:02 PM By: Karie Schwalbe RN Entered By: Karie Schwalbe on 05/17/2022 13:19:10 -------------------------------------------------------------------------------- Pain Assessment Details Patient Name: Date of Service: Elizabeth [name], Elizabeth [name] 05/17/2022 12:45 PM Medical Record Number: [id number] Patient Account Number: [id number] Date of Birth/Sex: Treating RN: 1940/11/02 (82 y.o. Katrinka Blazing Primary Care Cimberly Stoffel: Loura Back Other Clinician: Referring Porschea Borys: Treating Sudais Banghart/Extender: Rowe Pavy in Treatment: 0 Active Problems Location of Pain Severity and Description of Pain Patient Has Paino No Site Locations [place], [place] ([id number]) [id number] Page 6 of 8 Pain Management and Medication Current Pain Management: Electronic Signature(s) Signed: 05/17/2022 4:47:02 PM By: Karie Schwalbe RN Entered By: Karie Schwalbe on 05/17/2022 13:06:08 -------------------------------------------------------------------------------- Patient/Caregiver Education Details Patient Name: Date of Service: Elizabeth [name] 1/26/2024andnbsp12:45 PM Medical Record Number: [id number] Patient Account Number: [id number] Date of Birth/Gender: Treating RN: 04-Sep-1940 (82 y.o. Katrinka Blazing Primary Care Physician: Loura Back Other  Clinician: Referring Physician: Treating Physician/Extender: Rowe Pavy in Treatment: 0 Education Assessment Education Provided To: Patient Education Topics Provided Wound/Skin Impairment: Methods: Explain/Verbal Responses: Return demonstration correctly Electronic Signature(s) Signed: 05/17/2022 4:47:02 PM By: Karie Schwalbe RN Entered By: Karie Schwalbe on 05/17/2022 16:35:39 -------------------------------------------------------------------------------- Wound Assessment Details Patient Name: Date of Service: Elizabeth [name], Elizabeth [name] 05/17/2022 12:45 PM Medical Record Number: [id number] Patient Account Number: [id number] Date of Birth/Sex: Treating RN: [birth year] (82 y.o. Katrinka Blazing Primary Care Kennady Zimmerle: Loura Back Other Clinician: Referring March Steyer: Treating Donzell Coller/Extender: Gabrielle Dare [place], Derwood Kaplan ([id number]) [id number] Page 7 of 8 Weeks in Treatment: 0 Wound Status Wound Number: 3 Primary Lymphedema Etiology: Wound Location: Left, Anterior Lower Leg Wound Healed - Epithelialized Wounding Event: Blister Status: Date Acquired: 03/26/2022 Comorbid Anemia, Asthma, Sleep Apnea, Congestive Heart Failure, Weeks Of Treatment: 6 History: Hypertension, Peripheral Venous Disease, Type II Diabetes, Clustered Wound: No Osteoarthritis, Neuropathy Photos Wound Measurements Length: (cm) Width: (cm) Depth: (cm) Area: (cm) Volume: (cm) 0 % Reduction in Area: 100% 0 % Reduction  in Volume: 100% 0 Epithelialization: None 0 Tunneling: No 0 Undermining: No Wound Description Classification: Full Thickness Without Exposed Support Structures Wound Margin: Distinct, outline attached Exudate Amount: None Present Foul Odor After Cleansing: No Slough/Fibrino No Wound Bed Granulation Amount: None Present (0%) Exposed Structure Necrotic Amount: None Present (0%) Fascia Exposed: No Fat Layer (Subcutaneous  Tissue) Exposed: No Tendon Exposed: No Muscle Exposed: No Joint Exposed: No Bone Exposed: No Periwound Skin Texture Texture Color No Abnormalities Noted: Yes No Abnormalities Noted: Yes Moisture Temperature / Pain No Abnormalities Noted: Yes Temperature: No Abnormality Electronic Signature(s) Signed: 05/17/2022 4:47:02 PM By: Karie Schwalbe RN Entered By: Karie Schwalbe on 05/17/2022 13:18:38 -------------------------------------------------------------------------------- Vitals Details Patient Name: Date of Service: Elizabeth [name] 05/17/2022 12:45 PM Medical Record Number: [id number] Patient Account Number: [id number] Date of Birth/Sex: Treating RN: 1940/11/30 (82 y.o. F) Primary Care Deisha Stull: Loura Back Other Clinician: Referring Dariyon Urquilla: Treating Marijke Guadiana/Extender: Rowe Pavy in Treatment: [address] Elizabeth [name], Elizabeth [name] ([id number]) [id number] Page 8 of 8 Vital Signs Time Taken: 01:01 Temperature (F): 97.8 Height (in): 63 Respiratory Rate (breaths/min): 20 Weight (lbs): 285 Reference Range: 80 - 120 mg / dl Body Mass Index (BMI): 50.5 Electronic Signature(s) Signed: 05/17/2022 1:35:26 PM By: Dayton Scrape Entered By: Dayton Scrape on 05/17/2022 13:02:13

## 2022-05-22 ENCOUNTER — Ambulatory Visit: Payer: 59 | Admitting: Podiatry

## 2022-06-03 ENCOUNTER — Ambulatory Visit: Payer: 59 | Admitting: Podiatry

## 2022-06-05 ENCOUNTER — Ambulatory Visit: Payer: 59 | Admitting: Podiatry

## 2022-06-18 ENCOUNTER — Ambulatory Visit (INDEPENDENT_AMBULATORY_CARE_PROVIDER_SITE_OTHER): Payer: 59 | Admitting: Podiatry

## 2022-06-18 ENCOUNTER — Encounter: Payer: Self-pay | Admitting: Podiatry

## 2022-06-18 DIAGNOSIS — M79675 Pain in left toe(s): Secondary | ICD-10-CM

## 2022-06-18 DIAGNOSIS — E119 Type 2 diabetes mellitus without complications: Secondary | ICD-10-CM | POA: Diagnosis not present

## 2022-06-18 DIAGNOSIS — B351 Tinea unguium: Secondary | ICD-10-CM | POA: Diagnosis not present

## 2022-06-18 DIAGNOSIS — M79674 Pain in right toe(s): Secondary | ICD-10-CM | POA: Diagnosis not present

## 2022-06-18 NOTE — Progress Notes (Signed)
This patient returns to my office for at risk foot care.  This patient requires this care by a professional since this patient will be at risk due to having type 2 diabetes and chronic venous insufficiency. This patient is unable to cut nails herself since the patient cannot reach his nails.These nails are painful walking and wearing shoes.  This patient presents for at risk foot care today.  General Appearance  Alert, conversant and in no acute stress.  Vascular  Dorsalis pedis and posterior tibial  pulses are palpable  bilaterally.  Capillary return is within normal limits  bilaterally. Temperature is within normal limits  bilaterally.  Neurologic  Senn-Weinstein monofilament wire test diminished   bilaterally. Muscle power within normal limits bilaterally.  Nails Thick disfigured discolored nails with subungual debris  from hallux to fifth toes bilaterally. No evidence of bacterial infection or drainage bilaterally.  Orthopedic  No limitations of motion  feet .  No crepitus or effusions noted.  No bony pathology or digital deformities noted.  Pes planus  Skin  normotropic skin with no porokeratosis noted bilaterally.  No signs of infections or ulcers noted.   Dry scaly skin.  Onychomycosis  Pain in right toes  Pain in left toes  Consent was obtained for treatment procedures.   Mechanical debridement of nails 1-5  bilaterally performed with a nail nipper.  Filed with dremel without incident.    Return office visit   3 months                   Told patient to return for periodic foot care and evaluation due to potential at risk complications.   Gardiner Barefoot DPM

## 2022-07-02 ENCOUNTER — Encounter: Payer: Self-pay | Admitting: Physician Assistant

## 2022-07-11 ENCOUNTER — Other Ambulatory Visit (INDEPENDENT_AMBULATORY_CARE_PROVIDER_SITE_OTHER): Payer: 59

## 2022-07-11 ENCOUNTER — Ambulatory Visit: Payer: 59

## 2022-07-11 ENCOUNTER — Ambulatory Visit (INDEPENDENT_AMBULATORY_CARE_PROVIDER_SITE_OTHER): Payer: 59 | Admitting: Physician Assistant

## 2022-07-11 ENCOUNTER — Encounter: Payer: Self-pay | Admitting: Physician Assistant

## 2022-07-11 VITALS — BP 174/80 | HR 102 | Resp 18 | Ht 64.0 in | Wt 283.0 lb

## 2022-07-11 DIAGNOSIS — R413 Other amnesia: Secondary | ICD-10-CM | POA: Diagnosis not present

## 2022-07-11 MED ORDER — DONEPEZIL HCL 5 MG PO TABS: 5.0000 mg | ORAL_TABLET | Freq: Every day | ORAL | 2 refills | Status: DC

## 2022-07-11 NOTE — Patient Instructions (Signed)
It was a pleasure to see you today at our office.   Recommendations:   MRI of the brain, the radiology office will call you to arrange you appointment Check labs today Follow up in May 6 at 1 pm  Start Donepezil 5 mg  daily. Side effects discussed    Whom to call:  Memory  decline, memory medications: Call our office [phone]       For assessment of decision of mental capacity and competency:  Call Dr. Anthoney Harada, geriatric psychiatrist at [phone]        Feel free to visit Facebook page " [email or web address]" for tips of how to care for people with memory problems.       RECOMMENDATIONS FOR ALL PATIENTS WITH MEMORY PROBLEMS: 1. Continue to exercise (Recommend 30 minutes of walking everyday, or 3 hours every week) 2. Increase social interactions - continue going to [place] and enjoy social gatherings with friends and family 3. Eat healthy, avoid fried foods and eat more fruits and vegetables 4. Maintain adequate blood pressure, blood sugar, and blood cholesterol level. Reducing the risk of stroke and cardiovascular disease also helps promoting better memory. 5. Avoid stressful situations. Live a simple life and avoid aggravations. Organize your time and prepare for the next day in anticipation. 6. Sleep well, avoid any interruptions of sleep and avoid any distractions in the bedroom that may interfere with adequate sleep quality 7. Avoid sugar, avoid sweets as there is a strong link between excessive sugar intake, diabetes, and cognitive impairment We discussed the Mediterranean diet, which has been shown to help patients reduce the risk of progressive memory disorders and reduces cardiovascular risk. This includes eating fish, eat fruits and green leafy vegetables, nuts like almonds and hazelnuts, walnuts, and also use olive oil. Avoid fast foods and fried foods as much as possible. Avoid sweets and sugar as sugar use has been linked to worsening of memory function.  There is always  a concern of gradual progression of memory problems. If this is the case, then we may need to adjust level of care according to patient needs. Support, both to the patient and caregiver, should then be put into place.       FALL PRECAUTIONS: Be cautious when walking. Scan the area for obstacles that may increase the risk of trips and falls. When getting up in the mornings, sit up at the edge of the bed for a few minutes before getting out of bed. Consider elevating the bed at the head end to avoid drop of blood pressure when getting up. Walk always in a well-lit room (use night lights in the walls). Avoid area rugs or power cords from appliances in the middle of the walkways. Use a walker or a cane if necessary and consider physical therapy for balance exercise. Get your eyesight checked regularly.  FINANCIAL OVERSIGHT: Supervision, especially oversight when making financial decisions or transactions is also recommended.  HOME SAFETY: Consider the safety of the kitchen when operating appliances like stoves, microwave oven, and blender. Consider having supervision and share cooking responsibilities until no longer able to participate in those. Accidents with firearms and other hazards in the house should be identified and addressed as well.   ABILITY TO BE LEFT ALONE: If patient is unable to contact 911 operator, consider using LifeLine, or when the need is there, arrange for someone to stay with patients. Smoking is a fire hazard, consider supervision or cessation. Risk of wandering should be assessed by caregiver and  if detected at any point, supervision and safe proof recommendations should be instituted.  MEDICATION SUPERVISION: Inability to self-administer medication needs to be constantly addressed. Implement a mechanism to ensure safe administration of the medications.   DRIVING: Regarding driving, in patients with progressive memory problems, driving will be impaired. We advise to have  someone else do the driving if trouble finding directions or if minor accidents are reported. Independent driving assessment is available to determine safety of driving.   If you are interested in the driving assessment, you can contact the following:  [employer] in [place]  [place] [phone] or [phone]    Elizabeth [name] refers to food and lifestyle choices that are based on the traditions of countries located on the [employer]. This way of eating has been shown to help prevent certain conditions and improve outcomes for people who have chronic diseases, like kidney disease and heart disease. What are tips for following this plan? Lifestyle  Cook and eat meals together with your family, when possible. Drink enough fluid to keep your urine clear or pale yellow. Be physically active every day. This includes: Aerobic exercise like running or swimming. Leisure activities like gardening, walking, or housework. Get 7-8 hours of sleep each night. If recommended by your health care provider, drink red wine in moderation. This means 1 glass a day for nonpregnant women and 2 glasses a day for men. A glass of wine equals 5 oz (150 mL). Reading food labels  Check the serving size of packaged foods. For foods such as rice and pasta, the serving size refers to the amount of cooked product, not dry. Check the total fat in packaged foods. Avoid foods that have saturated fat or trans fats. Check the ingredients list for added sugars, such as corn syrup. Shopping  At the grocery store, buy most of your food from the areas near the walls of the store. This includes: Fresh fruits and vegetables (produce). Grains, beans, nuts, and seeds. Some of these may be available in unpackaged forms or large amounts (in bulk). Fresh seafood. Poultry and  eggs. Low-fat dairy products. Buy whole ingredients instead of prepackaged foods. Buy fresh fruits and vegetables in-season from local farmers markets. Buy frozen fruits and vegetables in resealable bags. If you do not have access to quality fresh seafood, buy precooked frozen shrimp or canned fish, such as tuna, salmon, or sardines. Buy small amounts of raw or cooked vegetables, salads, or olives from the deli or salad bar at your store. Stock your pantry so you always have certain foods on hand, such as olive oil, canned tuna, canned tomatoes, rice, pasta, and beans. Cooking  Cook foods with extra-virgin olive oil instead of using butter or other vegetable oils. Have meat as a side dish, and have vegetables or grains as your main dish. This means having meat in small portions or adding small amounts of meat to foods like pasta or stew. Use beans or vegetables instead of meat in common dishes like chili or lasagna. Experiment with different cooking methods. Try roasting or broiling vegetables instead of steaming or sauteing them. Add frozen vegetables to soups, stews, pasta, or rice. Add nuts or seeds for added healthy fat at each meal. You can add these to yogurt, salads, or vegetable dishes. Marinate fish or vegetables using olive oil, lemon juice, garlic, and fresh herbs. Meal planning  Plan to eat 1  vegetarian meal one day each week. Try to work up to 2 vegetarian meals, if possible. Eat seafood 2 or more times a week. Have healthy snacks readily available, such as: Vegetable sticks with hummus. Greek yogurt. Fruit and nut trail mix. Eat balanced meals throughout the week. This includes: Fruit: 2-3 servings a day Vegetables: 4-5 servings a day Low-fat dairy: 2 servings a day Fish, poultry, or lean meat: 1 serving a day Beans and legumes: 2 or more servings a week Nuts and seeds: 1-2 servings a day Whole grains: 6-8 servings a day Extra-virgin olive oil: 3-4 servings a day Limit  red meat and sweets to only a few servings a month What are my food choices? Mediterranean diet Recommended Grains: Whole-grain pasta. Brown rice. Bulgar wheat. Polenta. Couscous. Whole-wheat bread. [place]. Vegetables: Artichokes. Beets. Broccoli. Cabbage. Carrots. Eggplant. Green beans. Chard. Kale. Spinach. Onions. Leeks. Peas. Squash. Tomatoes. Peppers. Radishes. Fruits: Apples. Apricots. Avocado. Berries. Bananas. Cherries. Dates. Figs. Grapes. Lemons. Melon. Oranges. Peaches. Plums. Pomegranate. Meats and other protein foods: Beans. Almonds. Sunflower seeds. Pine nuts. Peanuts. [place]. Salmon. Scallops. Shrimp. [place]. Tilapia. Clams. Oysters. Eggs. Dairy: Low-fat milk. Cheese. Greek yogurt. Beverages: Water. Red wine. Herbal tea. Fats and oils: Extra virgin olive oil. Avocado oil. Grape seed oil. Sweets and desserts: [place] yogurt with honey. Baked apples. Poached pears. Trail mix. Seasoning and other foods: Basil. Cilantro. Coriander. Cumin. Mint. Parsley. Sage. Rosemary. Tarragon. Garlic. Oregano. Thyme. Pepper. Balsalmic vinegar. Tahini. Hummus. Tomato sauce. Olives. Mushrooms. Limit these Grains: Prepackaged pasta or rice dishes. Prepackaged cereal with added sugar. Vegetables: Deep fried potatoes (french fries). Fruits: Fruit canned in syrup. Meats and other protein foods: Beef. Pork. Lamb. Poultry with skin. Hot dogs. [place] Salines. Dairy: Ice cream. Sour cream. Whole milk. Beverages: Juice. Sugar-sweetened soft drinks. Beer. Liquor and spirits. Fats and oils: Butter. Canola oil. Vegetable oil. Beef fat (tallow). Lard. Sweets and desserts: Cookies. Cakes. Pies. Candy. Seasoning and other foods: Mayonnaise. Premade sauces and marinades. The items listed may not be a complete list. Talk with your dietitian about what dietary choices are right for you. Summary The Mediterranean diet includes both food and lifestyle choices. Eat a variety of fresh fruits and vegetables, beans, nuts,  seeds, and whole grains. Limit the amount of red meat and sweets that you eat. Talk with your health care provider about whether it is safe for you to drink red wine in moderation. This means 1 glass a day for nonpregnant women and 2 glasses a day for men. A glass of wine equals 5 oz (150 mL). This information is not intended to replace advice given to you by your health care provider. Make sure you discuss any questions you have with your health care provider. Document Released: 11/30/2015 Document Revised: 01/02/2016 Document Reviewed: 11/30/2015 Elsevier Interactive Patient Education  2017 [employer].

## 2022-07-11 NOTE — Progress Notes (Addendum)
swallowing   Assessment/Plan:    The patient is seen in neurologic consultation at the request of [name], MD for the evaluation of memory.  Elizabeth [name] is a very pleasant 82 y.o. year old RH female with a history of hypertension, hyperlipidemia, OSA not on CPAP, seen today for evaluation of memory loss. MoCA today is 15/30 .  Patient is not on antidementia medication.  Workup is currently in progress.   Memory Impairment  MRI brain without contrast to assess for underlying structural abnormality and assess vascular load  Recommend resuming use of CPAP. Check B12, TSH Folllow up in  2 months.  Subjective:    The patient is accompanied by her daughter who supplements the history.    How long did patient have memory difficulties?  For about 2 years.  The patient was noted to have some difficulty remembering recent conversations and names of people.  She is not aware of these changes.  This was seen by family.    repeats oneself?  Endorsed for the last year  Disoriented when walking into a room?  Patient denies   Leaving objects in unusual places?   denies    Wandering behavior? denies   Any personality changes since last visit? denies  "She may get a little meaner than before" Any history of depression?: Her father was killed when she was 41 and since then she does not talk about it, she keeps stuff.  "She was the black sheep of the family, per daughter report " Hallucinations or paranoia?   She saw her dead father one time.  Seizures? denies    Any sleep changes?  Denies  vivid dreams, REM behavior or sleepwalking   Sleep apnea? She does not want to use the CPAP and sleeps in the chair.  Any hygiene concerns?  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  Endorsed  Does the patient need help with medications? Daughter  is in charge   Who is in charge of the finances? Daughter is in charge     Any  changes in appetite? Denies  Patient have trouble swallowing?  denies   Does the patient cook?  No Any kitchen accidents such as leaving the stove on?  Her son reported that 1 time she has put beads in the spaghetti, she denies  Any headaches?  denies   Chronic back pain?  denies    Ambulates with difficulty?  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  No loss of consciousness or head injury.  They blame it on using slippery shoes.  She now uses a walker to ambulate.   Recent falls or head injuries? denies     Vision changes? Denies  Unilateral weakness, numbness or tingling?  denies   Any tremors?  denies   Any anosmia?  denies   Any incontinence of urine? Endorsed Any bowel dysfunction?    Endorsed, "she waits till last minute ands then she soils herself " Patient lives   with her son History of heavy alcohol intake? denies   History of heavy tobacco use? denies   Family history of dementia?  Mother had dementia  Does patient drive?  No longer drives   No Known Allergies  Current Outpatient Medications  Medication Instructions   acetaminophen (TYLENOL) 650 mg, Oral, Every 6 hours PRN  albuterol (PROVENTIL HFA;VENTOLIN HFA) 108 (90 BASE) MCG/ACT inhaler 2 puffs, Every 6 hours PRN   albuterol (PROVENTIL) 2.5 mg, Nebulization, Every 6 hours PRN, For bronchitis    budesonide-formoterol (SYMBICORT) 160-4.5 MCG/ACT inhaler 2 puffs, Inhalation, 2 times daily   Cetirizine HCl (ZYRTEC PO) Oral, Daily   diclofenac Sodium (VOLTAREN) 2 g, 4 times daily   fluticasone (FLONASE) 50 MCG/ACT nasal spray 2 sprays, Daily   GEMTESA 75 MG TABS 1 tablet, Oral, Daily   guaiFENesin-dextromethorphan (ROBITUSSIN DM) 100-10 MG/5ML syrup 10 mLs, Oral, Every 4 hours PRN   hydrALAZINE (APRESOLINE) 10 mg, Oral, 2 times daily   hydrochlorothiazide (HYDRODIURIL) 25 mg, Oral, Daily   losartan (COZAAR) 100 MG tablet TAKE 1 TABLET BY MOUTH ONCE A DAY TAKE 1 TABLET BY  MOUTH ONCE DAILY FOR BLOOD PRESSURE   metFORMIN (GLUCOPHAGE) 500 mg, Oral, 2 times daily with meals   Multiple Vitamins-Minerals (CENTRUM SILVER PO) 1 tablet   nitroGLYCERIN (NITROSTAT) 0.4 MG SL tablet PLEASE SEE ATTACHED FOR DETAILED DIRECTIONS   oxybutynin (DITROPAN) 5 mg, 2 times daily   pantoprazole (PROTONIX) 40 mg, Daily   potassium chloride (KLOR-CON) 10 MEQ tablet 20 mEq, Oral, Daily   Potassium Chloride ER 20 MEQ TBCR 1 tablet, Oral, Daily   pravastatin (PRAVACHOL) 10 mg, Daily at bedtime     VITALS:   Vitals:   07/11/22 1312  BP: (!) 174/80  Pulse: (!) 102  Resp: 18  SpO2: 95%  Weight: 283 lb (128.4 kg)  Height: 5\' 4"  (1.626 m)       No data to display          PHYSICAL EXAM   HEENT:  Normocephalic, atraumatic. The mucous membranes are moist. The superficial temporal arteries are without ropiness or tenderness. Cardiovascular: Regular rate and rhythm. Lungs: Clear to auscultation bilaterally. Neck: There are no carotid bruits noted bilaterally.  NEUROLOGICAL:    07/11/2022    1:00 PM  Montreal Cognitive Assessment   Visuospatial/ Executive (0/5) 1  Naming (0/3) 3  Attention: Read list of digits (0/2) 2  Attention: Read list of letters (0/1) 1  Attention: Serial 7 subtraction starting at 100 (0/3) 0  Language: Repeat phrase (0/2) 2  Language : Fluency (0/1) 0  Abstraction (0/2) 1  Delayed Recall (0/5) 2  Orientation (0/6) 2  Total 14  Adjusted Score (based on education) 15        No data to display           Orientation:  Alert and oriented to person, not to place or time. No aphasia or dysarthria. Fund of knowledge is appropriate. Recent and remote memory impaired.  Attention and concentration are reduced. Able to name objects and repeat phrases. Delayed recall 2/5 Cranial nerves: There is good facial symmetry. Extraocular muscles are intact and visual fields are full to confrontational testing. Speech is fluent and clear. no tongue deviation.  Hearing is intact to conversational tone.  Tone: Tone is good throughout. Sensation: Sensation is intact to light touch and pinprick throughout. Vibration is intact at the bilateral big toe.  There is no sensory dermatomal level identified. Coordination: The patient has no difficulty with RAM's or FNF bilaterally. Normal finger to nose  Motor: Strength is 5/5 in the bilateral upper and lower extremities. There is no pronator drift. There are no fasciculations noted. DTR's: Deep tendon reflexes are 1/4 at the bilateral biceps, triceps, brachioradialis, patella and achilles.  Plantar responses are downgoing bilaterally. Gait and Station: The patient is  able to ambulate some difficulty due to arthritis and right lymphedema, needs a walker, she has left shoulder arthritis..      Thank you for allowing [place] the opportunity to participate in the care of this nice patient. Please do not hesitate to contact us for any questions or concerns.   Total time spent on today's visit was 40 minutes dedicated to this patient today, preparing to see patient, examining the patient, ordering tests and/or medications and counseling the patient, documenting clinical information in the EHR or other health record, independently interpreting results and communicating results to the patient/family, discussing treatment and goals, answering patient's questions and coordinating care.  Cc:  Arthur Holms, NP  Sharene Butters 07/11/2022 1:42 PM

## 2022-07-12 ENCOUNTER — Other Ambulatory Visit: Payer: Self-pay | Admitting: Registered Nurse

## 2022-07-12 DIAGNOSIS — Z1231 Encounter for screening mammogram for malignant neoplasm of breast: Secondary | ICD-10-CM

## 2022-07-12 LAB — TSH: TSH: 3.3 u[IU]/mL (ref 0.35–5.50)

## 2022-07-12 LAB — VITAMIN B12: Vitamin B-12: 231 pg/mL (ref 211–911)

## 2022-07-12 NOTE — Progress Notes (Signed)
B12 is low needs vitamin B12 is low.  Recommend starting on vitamin B12 1000 mcg daily.  Follow-up with PCP.  Thank you

## 2022-07-15 ENCOUNTER — Ambulatory Visit
Admission: RE | Admit: 2022-07-15 | Discharge: 2022-07-15 | Disposition: A | Discharge status: Home / Self Care | Payer: 59 | Source: Ambulatory Visit | Attending: Registered Nurse | Admitting: Registered Nurse

## 2022-07-15 DIAGNOSIS — Z1231 Encounter for screening mammogram for malignant neoplasm of breast: Secondary | ICD-10-CM

## 2022-08-06 ENCOUNTER — Ambulatory Visit
Admission: RE | Admit: 2022-08-06 | Discharge: 2022-08-06 | Disposition: A | Discharge status: Home / Self Care | Payer: 59 | Source: Ambulatory Visit | Attending: Physician Assistant | Admitting: Physician Assistant

## 2022-08-12 NOTE — Progress Notes (Signed)
MRI brain no acute findings, some chronic vascular changes but no significant volume loss or atrophy. 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 Recommend referral to ENT for that.

## 2022-08-13 ENCOUNTER — Encounter (HOSPITAL_BASED_OUTPATIENT_CLINIC_OR_DEPARTMENT_OTHER): Payer: Self-pay | Admitting: Pulmonary Disease

## 2022-08-13 ENCOUNTER — Ambulatory Visit (INDEPENDENT_AMBULATORY_CARE_PROVIDER_SITE_OTHER): Payer: 59 | Admitting: Pulmonary Disease

## 2022-08-13 VITALS — BP 130/70 | HR 77 | Ht 64.0 in | Wt 283.6 lb

## 2022-08-13 DIAGNOSIS — G4733 Obstructive sleep apnea (adult) (pediatric): Secondary | ICD-10-CM | POA: Diagnosis not present

## 2022-08-13 DIAGNOSIS — J4540 Moderate persistent asthma, uncomplicated: Secondary | ICD-10-CM

## 2022-08-13 DIAGNOSIS — J329 Chronic sinusitis, unspecified: Secondary | ICD-10-CM

## 2022-08-13 MED ORDER — FLUTICASONE PROPIONATE 50 MCG/ACT NA SUSP: 1.0000 | Freq: Two times a day (BID) | NASAL | 2 refills | Status: DC

## 2022-08-13 MED ORDER — AZELASTINE HCL 0.15 % NA SOLN: 1.0000 | Freq: Two times a day (BID) | NASAL | 2 refills | Status: DC

## 2022-08-13 NOTE — Patient Instructions (Signed)
Saline nasal rinse twice per day. Astepro 1 spray in each nostril twice per day. Flonase 1 spray in each nostril twice per day. Symbicort two puffs in the morning and two puffs in the evening, and rinse your mouth after each use. Albuterol two puffs every 6 hours as needed for cough, wheeze, chest congestion, or shortness of breath. Will arrange for overnight oxygen test while using CPAP. Follow up in 3 months at [facility] office with Dr. Wynona Neat.

## 2022-08-13 NOTE — Progress Notes (Signed)
[facility]  Chief Complaint  Patient presents with   Follow-up    Wants to be seen at [address]    Past Surgical History:  She  has a past surgical history that includes Abdominal hysterectomy (1996); Thyroid surgery (2001); and Cystectomy (1980's).  Past Medical History:  Arthritis, Asthma, GERD, HTN, DM type 2, COVID January 2023  Constitutional:  BP 130/70 (BP Location: Right Arm, Cuff Size: Large)   Pulse 77   Ht 5\' 4"  (1.626 m)   Wt 283 lb 9.6 oz (128.6 kg)   SpO2 96%   BMI 48.68 kg/m   Brief Summary:  Elizabeth [name] is a 82 y.o. female former smoker with obstructive sleep apnea.      Subjective:   She is here with her daughter.  Home sleep study showed mild sleep apnea.  Started on auto CPAP.  Feels this helps.  Has full face mask.  Has continued problem with her sinuses.  She was seen by neurology for dementia assessment.  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  She is asking for ENT referral.  She uses symbicort one puff bid.  Hasn't needed albuterol much.  Hasn't been using flonase.  Uses nasal irrigation intermittently.  Has congestion and pressure in her sinuses.  Has persistent runny nose.  It is difficult for her to come to the [facility] office and would prefer to have follow up in the [facility].  Physical Exam:   Appearance - well kempt   ENMT - no sinus tenderness, no oral exudate, no LAN, Mallampati 3 airway, no stridor, wears dentures, nasally voice  Respiratory - equal breath sounds bilaterally, no wheezing or rales  CV - s1s2 regular rate and rhythm, no murmurs  Ext - no clubbing, no edema  Skin - no rashes  Psych - normal mood and affect   Sleep Tests:  HST 03/11/22 >> AHI 13.7, SpO2 low 81%  Auto CPAP 07/13/22 to 08/11/22 >> used on 28 of 30 nights with average 1 hrs 32 min.   Average AHI 1.5 with median CPAP 9 and 95 th percentile CPAP 13 cm H2O  Cardiac Tests:  Echo 11/19/16 >> EF 55 to 60%, grade 1 DD, mild LVH  Social History:  She  reports that she quit smoking about 58 years ago. Her smoking use included cigarettes. She has never used smokeless tobacco. She reports that she does not drink alcohol and does not use drugs.  Family History:  Her family history includes Cancer in her maternal uncle; Diabetes in her mother; Heart disease in her mother.     Assessment/Plan:   Obstructive sleep apnea. 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 - will have her use nasal irrigation, flonase, and astepro bid - will arrange for referral to Dr. Narda Bonds with ENT  Moderate, persistent asthma. - reviewed different roles for her inhalers - symbicort 160 two puffs bid - prn albuterol  Time Spent Involved in Patient Care on Day of Examination:  35 minutes  Follow up:   Patient Instructions  Saline nasal  rinse twice per day. Astepro 1 spray in each nostril twice per day. Flonase 1 spray in each nostril twice per day. Symbicort two puffs in the morning and two puffs in the evening, and rinse your mouth after each use. Albuterol two puffs every 6 hours as needed for cough, wheeze, chest congestion, or shortness of breath. Will arrange for overnight oxygen test while using CPAP. Follow up in 3 months at [facility] office with Dr. Wynona Neat.  Medication List:   Allergies as of 08/13/2022   No Known Allergies      Medication List        Accurate as of August 13, 2022  2:45 PM. If you have any questions, ask your nurse or doctor.          acetaminophen 325 MG tablet Commonly  known as: TYLENOL Take 2 tablets (650 mg total) by mouth every 6 (six) hours as needed for mild pain or headache (fever >/= 101).   albuterol 108 (90 Base) MCG/ACT inhaler Commonly known as: VENTOLIN HFA Inhale 2 puffs into the lungs every 6 (six) hours as needed. For bronchitis   albuterol (2.5 MG/3ML) 0.083% nebulizer solution Commonly known as: PROVENTIL Take 2.5 mg by nebulization every 6 (six) hours as needed. For bronchitis   Azelastine HCl 0.15 % Soln Commonly known as: Astepro Place 1 spray into the nose 2 (two) times daily. Started by: Coralyn Helling, MD   budesonide-formoterol 160-4.5 MCG/ACT inhaler Commonly known as: SYMBICORT Inhale 2 puffs into the lungs 2 (two) times daily.   CENTRUM SILVER PO Take 1 tablet by mouth.   diclofenac Sodium 1 % Gel Commonly known as: VOLTAREN Apply 2 g topically 4 (four) times daily.   donepezil 5 MG tablet Commonly known as: ARICEPT Take 1 tablet (5 mg total) by mouth daily.   fluticasone 50 MCG/ACT nasal spray Commonly known as: FLONASE Place 1 spray into both nostrils 2 (two) times daily. What changed:  how much to take how to take this when to take this Changed by: Coralyn Helling, MD   Gemtesa 75 MG Tabs Generic drug: Vibegron Take 1 tablet by mouth daily.   guaiFENesin-dextromethorphan 100-10 MG/5ML syrup Commonly known as: ROBITUSSIN DM Take 10 mLs by mouth every 4 (four) hours as needed for cough.   hydrALAZINE 10 MG tablet Commonly known as: APRESOLINE Take 10 mg by mouth 2 (two) times daily.   hydrochlorothiazide 25 MG tablet Commonly known as: HYDRODIURIL Take 25 mg by mouth daily.   losartan 100 MG tablet Commonly known as: COZAAR TAKE 1 TABLET BY MOUTH ONCE A DAY TAKE 1 TABLET BY MOUTH ONCE DAILY FOR BLOOD PRESSURE   metFORMIN 500 MG tablet Commonly known as: GLUCOPHAGE Take 500 mg by mouth 2 (two) times daily with a meal.   nitroGLYCERIN 0.4 MG SL tablet Commonly known as: NITROSTAT PLEASE SEE  ATTACHED FOR DETAILED DIRECTIONS   oxybutynin 5 MG tablet Commonly known as: DITROPAN Take 5 mg by mouth 2 (two) times daily.   pantoprazole 40 MG tablet Commonly known as: PROTONIX Take 40 mg by mouth daily.   potassium chloride 10 MEQ tablet Commonly known as: KLOR-CON Take 20 mEq by mouth daily.   Potassium Chloride ER 20 MEQ Tbcr Take 1 tablet by mouth daily.   pravastatin 10 MG tablet Commonly known as: PRAVACHOL Take 10 mg by mouth at bedtime.   ZYRTEC PO Take by mouth daily.        Signature:  Coralyn Helling, MD [facility]/Critical  Care Pager - [phone] - [phone] 08/13/2022, 2:45 PM

## 2022-08-15 ENCOUNTER — Other Ambulatory Visit (HOSPITAL_BASED_OUTPATIENT_CLINIC_OR_DEPARTMENT_OTHER): Payer: Self-pay | Admitting: Pulmonary Disease

## 2022-08-22 NOTE — Telephone Encounter (Signed)
Dr. Craige Cotta: Azelastine 0.15% on backorder per pharmacy.  Can we substitute another nasal spray?  Please advise.   Last OV 08/13/2022.  Per chart: Astepro 1 spray in each nostril twice per day.  Follow up in 3 months at [facility] office with Dr. Wynona Neat.

## 2022-08-26 ENCOUNTER — Ambulatory Visit: Payer: 59 | Admitting: Physician Assistant

## 2022-09-02 ENCOUNTER — Encounter: Payer: Self-pay | Admitting: Physician Assistant

## 2022-09-02 ENCOUNTER — Ambulatory Visit (INDEPENDENT_AMBULATORY_CARE_PROVIDER_SITE_OTHER): Payer: 59 | Admitting: Physician Assistant

## 2022-09-02 VITALS — BP 110/80 | HR 92 | Ht 63.0 in | Wt 284.0 lb

## 2022-09-02 DIAGNOSIS — R413 Other amnesia: Secondary | ICD-10-CM

## 2022-09-02 NOTE — Progress Notes (Signed)
Assessment/Plan:   Mild Cognitive Impairment   Elizabeth [name] is a very pleasant 82 y.o. RH female e with a history of hypertension, hyperlipidemia, OSA not on CPAP seen today in follow up to discuss the MRI of the brain results obtained on 08/09/2022. These were personally reviewed, remarkable for mild to moderate chronic small vessel ischemic disease, without acute intracranial abnormalities.  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 This patient is accompanied in the office by her daughter who supplements the history.  Previous records as well as any outside records available were reviewed prior to todays visit. She was last seen on 07/11/22 at which time her MoCA was 15/30. She has not been taking her prescribed donepezil because she is concerned about side effects. She is able to perform her ADLs to her physical ability.     Follow up in  6 months. Recommend good control of cardiovascular risk factors Follow up with Pulmonary for Sleep Apnea, recommend using CPAP  Patient is scheduled with ENT for sinus disease  Continue to control mood as per PCP Consider donepezil 5 mg daily, side effects discussed   Initial visit 07/11/2022  How long did patient have memory difficulties?  For about 2 years.  The patient was noted to have some difficulty remembering recent conversations and names of people.  She is not aware of these changes.  This was seen by family.    repeats oneself?  Endorsed for the last year  Disoriented when walking into a room?  Patient denies   Leaving objects in unusual places?   denies    Wandering behavior? denies   Any personality changes since last visit? denies  "She may get a little meaner than before" Any history of depression?: Her father was killed when she was 10 and since then she does not talk about it, she keeps stuff.  "She was the black sheep of the family, per daughter report  " Hallucinations or paranoia?   She saw her dead father one time.  Seizures? denies    Any sleep changes?  Denies  vivid dreams, REM behavior or sleepwalking   Sleep apnea? She does not want to use the CPAP and sleeps in the chair.  Any hygiene concerns?  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  Endorsed  Does the patient need help with medications? Daughter  is in charge   Who is in charge of the finances? Daughter is in charge     Any changes in appetite? Denies  Patient have trouble swallowing?  denies   Does the patient cook?  No Any kitchen accidents such as leaving the stove on?  Her son reported that 1 time she has put beads in the spaghetti, she denies  Any headaches?  denies   Chronic back pain?  denies    Ambulates with difficulty?  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  No loss of consciousness or head injury.  They blame it on using slippery shoes.  She now uses a walker to ambulate.   Recent falls or head injuries? denies     Vision changes? Denies  Unilateral weakness, numbness or tingling?  denies   Any tremors?  denies   Any anosmia?  denies   Any incontinence of  urine? Endorsed Any bowel dysfunction?    Endorsed, "she waits till last minute ands then she soils herself " Patient lives   with her son History of heavy alcohol intake? denies   History of heavy tobacco use? denies   Family history of dementia?  Mother had dementia  Does patient drive?  No longer drives       CURRENT MEDICATIONS:  Outpatient Encounter Medications as of 09/02/2022  Medication Sig   acetaminophen (TYLENOL) 325 MG tablet Take 2 tablets (650 mg total) by mouth every 6 (six) hours as needed for mild pain or headache (fever >/= 101).   albuterol (PROVENTIL HFA;VENTOLIN HFA) 108 (90 BASE) MCG/ACT inhaler Inhale 2 puffs into the lungs every 6 (six) hours as needed. For  bronchitis (Patient not taking: Reported on 03/28/2022)   albuterol (PROVENTIL) (2.5 MG/3ML) 0.083% nebulizer solution Take 2.5 mg by nebulization every 6 (six) hours as needed. For bronchitis   Azelastine HCl 0.15 % SOLN PLACE 1 SPRAY INTO THE NOSE 2 (TWO) TIMES DAILY.   budesonide-formoterol (SYMBICORT) 160-4.5 MCG/ACT inhaler Inhale 2 puffs into the lungs 2 (two) times daily.   Cetirizine HCl (ZYRTEC PO) Take by mouth daily.   diclofenac Sodium (VOLTAREN) 1 % GEL Apply 2 g topically 4 (four) times daily.   donepezil (ARICEPT) 5 MG tablet Take 1 tablet (5 mg total) by mouth daily.   fluticasone (FLONASE) 50 MCG/ACT nasal spray Place 1 spray into both nostrils 2 (two) times daily.   GEMTESA 75 MG TABS Take 1 tablet by mouth daily.   hydrALAZINE (APRESOLINE) 10 MG tablet Take 10 mg by mouth 2 (two) times daily.   hydrochlorothiazide (HYDRODIURIL) 25 MG tablet Take 25 mg by mouth daily.   losartan (COZAAR) 100 MG tablet TAKE 1 TABLET BY MOUTH ONCE A DAY TAKE 1 TABLET BY MOUTH ONCE DAILY FOR BLOOD PRESSURE   metFORMIN (GLUCOPHAGE) 500 MG tablet Take 500 mg by mouth 2 (two) times daily with a meal.   Multiple Vitamins-Minerals (CENTRUM SILVER PO) Take 1 tablet by mouth.   nitroGLYCERIN (NITROSTAT) 0.4 MG SL tablet PLEASE SEE ATTACHED FOR DETAILED DIRECTIONS   pantoprazole (PROTONIX) 40 MG tablet Take 40 mg by mouth daily.   potassium chloride (KLOR-CON) 10 MEQ tablet Take 20 mEq by mouth daily.   pravastatin (PRAVACHOL) 10 MG tablet Take 10 mg by mouth at bedtime.   No facility-administered encounter medications on file as of 09/02/2022.        No data to display            07/11/2022    1:00 PM  Montreal Cognitive Assessment   Visuospatial/ Executive (0/5) 1  Naming (0/3) 3  Attention: Read list of digits (0/2) 2  Attention: Read list of letters (0/1) 1  Attention: Serial 7 subtraction starting at 100 (0/3) 0  Language: Repeat phrase (0/2) 2  Language : Fluency (0/1) 0  Abstraction  (0/2) 1  Delayed Recall (0/5) 2  Orientation (0/6) 2  Total 14  Adjusted Score (based on education) 15   Thank you for allowing [place] the opportunity to participate in the care of this nice patient. Please do not hesitate to contact us for any questions or concerns.   Total time spent on today's visit was 20 minutes dedicated to this patient today, preparing to see patient, examining the patient, ordering tests and/or medications and counseling the patient, documenting clinical information in the EHR or other health record, independently interpreting results and communicating results to the  patient/family, discussing treatment and goals, answering patient's questions and coordinating care.  Cc:  Loura Back, NP  Marlowe Kays 09/02/2022 7:37 AM

## 2022-09-02 NOTE — Patient Instructions (Signed)
It was a pleasure to see you today at our office.   Recommendations:  Follow up in 6 month Follow with pulmonary on the sleep apnea Follow with ENT rega Sinusitis  Consider  Donepezil 5 mg  daily. Side effects discussed    Whom to call:  Memory  decline, memory medications: Call our office [phone]       For assessment of decision of mental capacity and competency:  Call Dr. Erick Blinks, geriatric psychiatrist at [phone]        Feel free to visit Facebook page " [email or web address]" for tips of how to care for people with memory problems.       RECOMMENDATIONS FOR ALL PATIENTS WITH MEMORY PROBLEMS: 1. Continue to exercise (Recommend 30 minutes of walking everyday, or 3 hours every week) 2. Increase social interactions - continue going to [place] and enjoy social gatherings with friends and family 3. Eat healthy, avoid fried foods and eat more fruits and vegetables 4. Maintain adequate blood pressure, blood sugar, and blood cholesterol level. Reducing the risk of stroke and cardiovascular disease also helps promoting better memory. 5. Avoid stressful situations. Live a simple life and avoid aggravations. Organize your time and prepare for the next day in anticipation. 6. Sleep well, avoid any interruptions of sleep and avoid any distractions in the bedroom that may interfere with adequate sleep quality 7. Avoid sugar, avoid sweets as there is a strong link between excessive sugar intake, diabetes, and cognitive impairment We discussed the Mediterranean diet, which has been shown to help patients reduce the risk of progressive memory disorders and reduces cardiovascular risk. This includes eating fish, eat fruits and green leafy vegetables, nuts like almonds and hazelnuts, walnuts, and also use olive oil. Avoid fast foods and fried foods as much as possible. Avoid sweets and sugar as sugar use has been linked to worsening of memory function.  There is always a concern of gradual  progression of memory problems. If this is the case, then we may need to adjust level of care according to patient needs. Support, both to the patient and caregiver, should then be put into place.       FALL PRECAUTIONS: Be cautious when walking. Scan the area for obstacles that may increase the risk of trips and falls. When getting up in the mornings, sit up at the edge of the bed for a few minutes before getting out of bed. Consider elevating the bed at the head end to avoid drop of blood pressure when getting up. Walk always in a well-lit room (use night lights in the walls). Avoid area rugs or power cords from appliances in the middle of the walkways. Use a walker or a cane if necessary and consider physical therapy for balance exercise. Get your eyesight checked regularly.  FINANCIAL OVERSIGHT: Supervision, especially oversight when making financial decisions or transactions is also recommended.  HOME SAFETY: Consider the safety of the kitchen when operating appliances like stoves, microwave oven, and blender. Consider having supervision and share cooking responsibilities until no longer able to participate in those. Accidents with firearms and other hazards in the house should be identified and addressed as well.   ABILITY TO BE LEFT ALONE: If patient is unable to contact 911 operator, consider using LifeLine, or when the need is there, arrange for someone to stay with patients. Smoking is a fire hazard, consider supervision or cessation. Risk of wandering should be assessed by caregiver and if detected at any point, supervision and safe  proof recommendations should be instituted.  MEDICATION SUPERVISION: Inability to self-administer medication needs to be constantly addressed. Implement a mechanism to ensure safe administration of the medications.   DRIVING: Regarding driving, in patients with progressive memory problems, driving will be impaired. We advise to have someone else do the driving  if trouble finding directions or if minor accidents are reported. Independent driving assessment is available to determine safety of driving.   If you are interested in the driving assessment, you can contact the following:  [employer] [facility] in [place] [phone]  [facility] [phone]  [facility] [phone] or [phone]    Mediterranean Diet A Mediterranean diet refers to food and lifestyle choices that are based on the traditions of countries located on the [employer]. This way of eating has been shown to help prevent certain conditions and improve outcomes for people who have chronic diseases, like kidney disease and heart disease. What are tips for following this plan? Lifestyle  Cook and eat meals together with your family, when possible. Drink enough fluid to keep your urine clear or pale yellow. Be physically active every day. This includes: Aerobic exercise like running or swimming. Leisure activities like gardening, walking, or housework. Get 7-8 hours of sleep each night. If recommended by your health care provider, drink red wine in moderation. This means 1 glass a day for nonpregnant women and 2 glasses a day for men. A glass of wine equals 5 oz (150 mL). Reading food labels  Check the serving size of packaged foods. For foods such as rice and pasta, the serving size refers to the amount of cooked product, not dry. Check the total fat in packaged foods. Avoid foods that have saturated fat or trans fats. Check the ingredients list for added sugars, such as corn syrup. Shopping  At the grocery store, buy most of your food from the areas near the walls of the store. This includes: Fresh fruits and vegetables (produce). Grains, beans, nuts, and seeds. Some of these may be available in unpackaged forms or large amounts (in bulk). Fresh seafood. Poultry and eggs. Low-fat dairy  products. Buy whole ingredients instead of prepackaged foods. Buy fresh fruits and vegetables in-season from local farmers markets. Buy frozen fruits and vegetables in resealable bags. If you do not have access to quality fresh seafood, buy precooked frozen shrimp or canned fish, such as tuna, salmon, or sardines. Buy small amounts of raw or cooked vegetables, salads, or olives from the deli or salad bar at your store. Stock your pantry so you always have certain foods on hand, such as olive oil, canned tuna, canned tomatoes, rice, pasta, and beans. Cooking  Cook foods with extra-virgin olive oil instead of using butter or other vegetable oils. Have meat as a side dish, and have vegetables or grains as your main dish. This means having meat in small portions or adding small amounts of meat to foods like pasta or stew. Use beans or vegetables instead of meat in common dishes like chili or lasagna. Experiment with different cooking methods. Try roasting or broiling vegetables instead of steaming or sauteing them. Add frozen vegetables to soups, stews, pasta, or rice. Add nuts or seeds for added healthy fat at each meal. You can add these to yogurt, salads, or vegetable dishes. Marinate fish or vegetables using olive oil, lemon juice, garlic, and fresh herbs. Meal planning  Plan to eat 1 vegetarian meal one day each week. Try to  work up to 2 vegetarian meals, if possible. Eat seafood 2 or more times a week. Have healthy snacks readily available, such as: Vegetable sticks with hummus. Greek yogurt. Fruit and nut trail mix. Eat balanced meals throughout the week. This includes: Fruit: 2-3 servings a day Vegetables: 4-5 servings a day Low-fat dairy: 2 servings a day Fish, poultry, or lean meat: 1 serving a day Beans and legumes: 2 or more servings a week Nuts and seeds: 1-2 servings a day Whole grains: 6-8 servings a day Extra-virgin olive oil: 3-4 servings a day Limit red meat and sweets  to only a few servings a month What are my food choices? Mediterranean diet Recommended Grains: Whole-grain pasta. Brown rice. Bulgar wheat. Polenta. Couscous. Whole-wheat bread. Orpah Cobb. Vegetables: Artichokes. Beets. Broccoli. Cabbage. Carrots. Eggplant. Green beans. Chard. Kale. Spinach. Onions. Leeks. Peas. Squash. Tomatoes. Peppers. Radishes. Fruits: Apples. Apricots. Avocado. Berries. Bananas. Cherries. Dates. Figs. Grapes. Lemons. Melon. Oranges. Peaches. Plums. Pomegranate. Meats and other protein foods: Beans. Almonds. Sunflower seeds. Pine nuts. Peanuts. Cod. Salmon. Scallops. Shrimp. Tuna. Tilapia. Clams. Oysters. Eggs. Dairy: Low-fat milk. Cheese. Greek yogurt. Beverages: Water. Red wine. Herbal tea. Fats and oils: Extra virgin olive oil. Avocado oil. Grape seed oil. Sweets and desserts: [place] yogurt with honey. Baked apples. Poached pears. Trail mix. Seasoning and other foods: Basil. Cilantro. Coriander. Cumin. Mint. Parsley. Sage. Rosemary. Tarragon. Garlic. Oregano. Thyme. Pepper. Balsalmic vinegar. Tahini. Hummus. Tomato sauce. Olives. Mushrooms. Limit these Grains: Prepackaged pasta or rice dishes. Prepackaged cereal with added sugar. Vegetables: Deep fried potatoes (french fries). Fruits: Fruit canned in syrup. Meats and other protein foods: Beef. Pork. Lamb. Poultry with skin. Hot dogs. Tomasa Blase. Dairy: Ice cream. Sour cream. Whole milk. Beverages: Juice. Sugar-sweetened soft drinks. Beer. Liquor and spirits. Fats and oils: Butter. Canola oil. Vegetable oil. Beef fat (tallow). Lard. Sweets and desserts: Cookies. Cakes. Pies. Candy. Seasoning and other foods: Mayonnaise. Premade sauces and marinades. The items listed may not be a complete list. Talk with your dietitian about what dietary choices are right for you. Summary The Mediterranean diet includes both food and lifestyle choices. Eat a variety of fresh fruits and vegetables, beans, nuts, seeds, and whole  grains. Limit the amount of red meat and sweets that you eat. Talk with your health care provider about whether it is safe for you to drink red wine in moderation. This means 1 glass a day for nonpregnant women and 2 glasses a day for men. A glass of wine equals 5 oz (150 mL). This information is not intended to replace advice given to you by your health care provider. Make sure you discuss any questions you have with your health care provider. Document Released: 11/30/2015 Document Revised: 01/02/2016 Document Reviewed: 11/30/2015 Elsevier Interactive Patient Education  2017 [employer].

## 2022-09-09 ENCOUNTER — Telehealth: Payer: Self-pay | Admitting: Pulmonary Disease

## 2022-09-09 DIAGNOSIS — G4733 Obstructive sleep apnea (adult) (pediatric): Secondary | ICD-10-CM

## 2022-09-09 NOTE — Telephone Encounter (Signed)
ONO with CPAP 08/22/22 >> test time 7 hrs 56 min.  Basal SpO2 92.6%, SpO2 low 79%.  Spent 10 min with an SpO2 < 88%.     Please let her know that her oxygen level is still low at night even though she is using CPAP.  Please arrange for a CPAP titration study.

## 2022-09-13 ENCOUNTER — Other Ambulatory Visit (HOSPITAL_BASED_OUTPATIENT_CLINIC_OR_DEPARTMENT_OTHER): Payer: Self-pay | Admitting: Pulmonary Disease

## 2022-09-17 ENCOUNTER — Ambulatory Visit: Payer: 59 | Admitting: Podiatry

## 2022-09-18 IMAGING — CR DG CHEST 2V
2 series · 2 of 2 positions shown · non-contrast
Comparison: None.

CLINICAL DATA: Leg swelling.  Evaluate for pulmonary edema.

EXAM:
CHEST - 2 VIEW

[w chest pa]
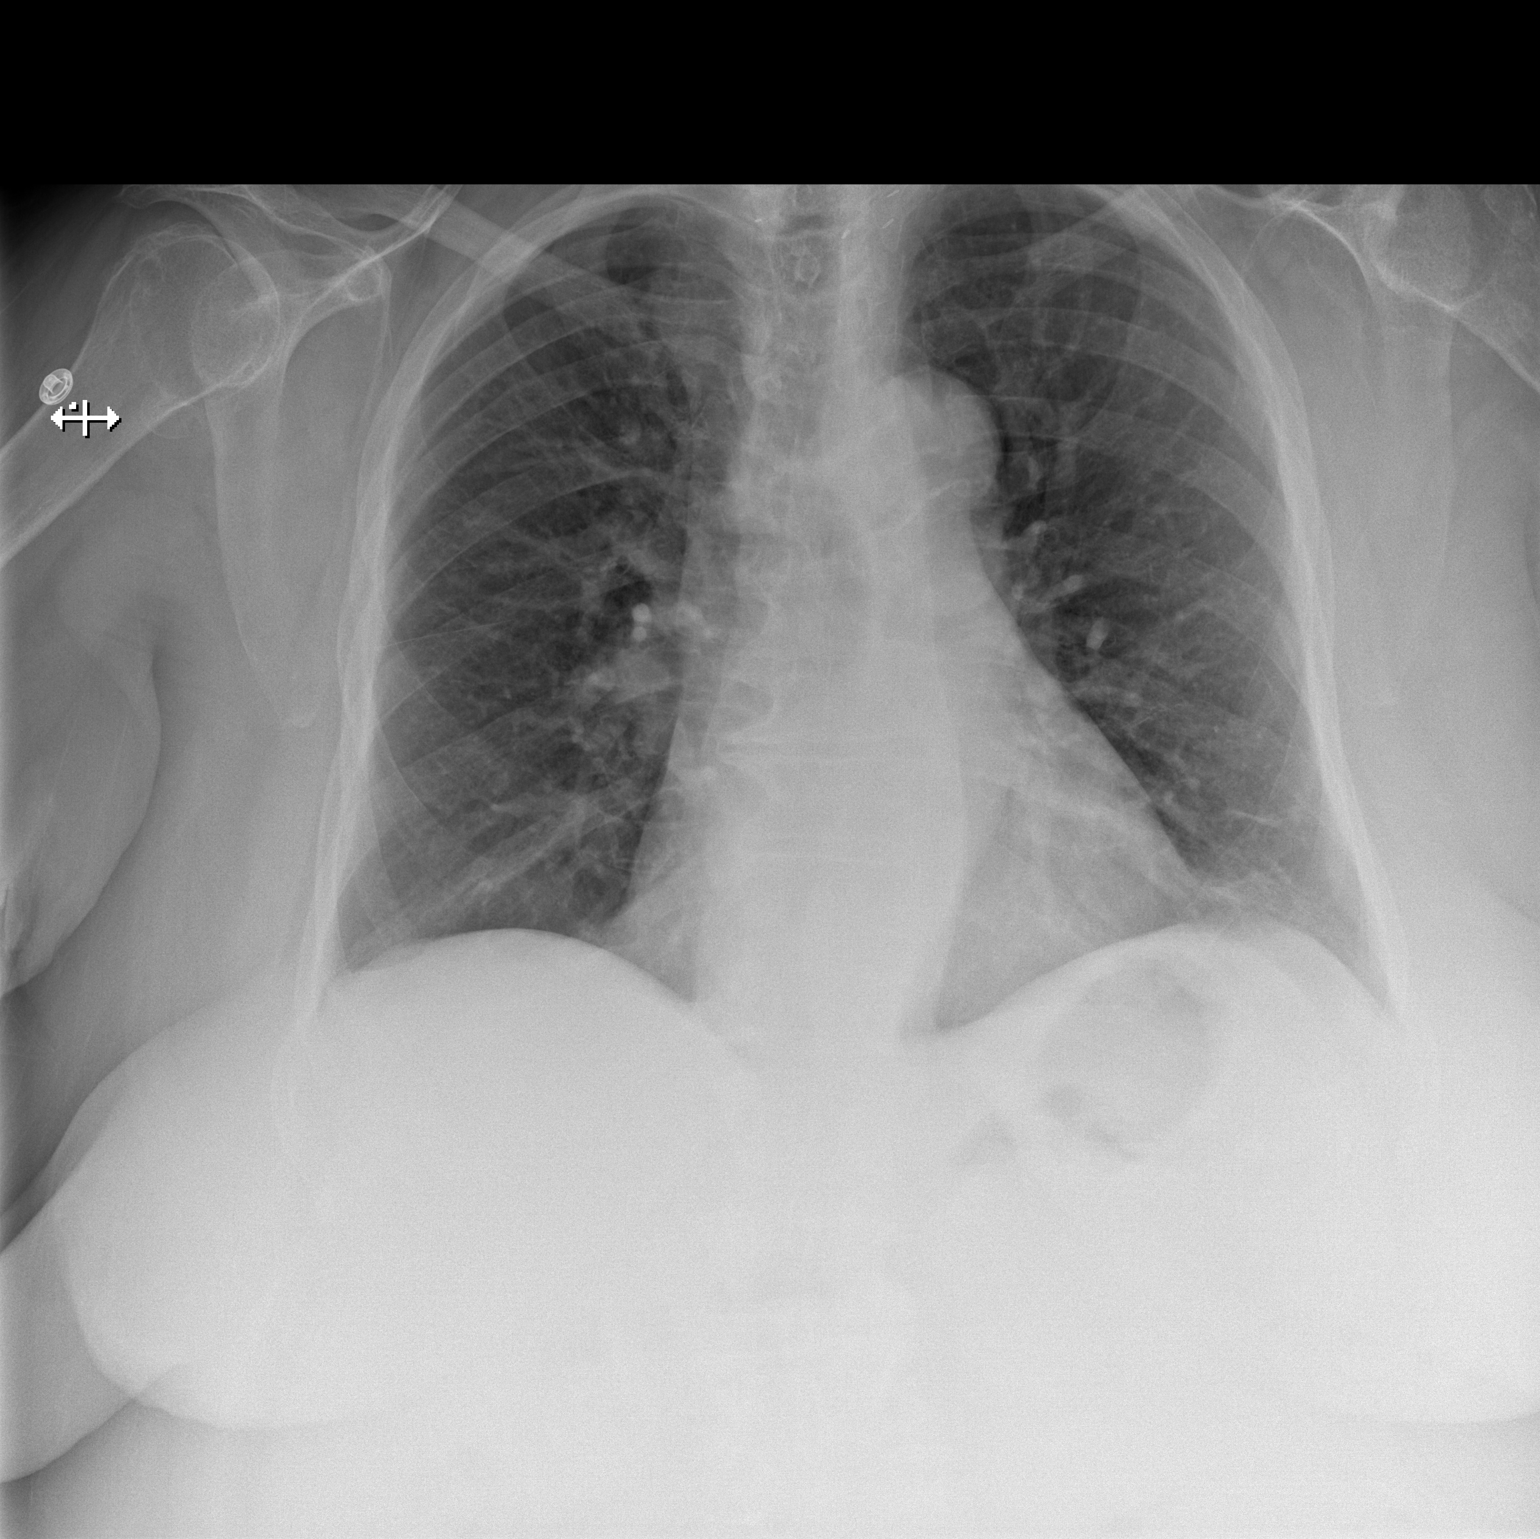

[w chest lat]
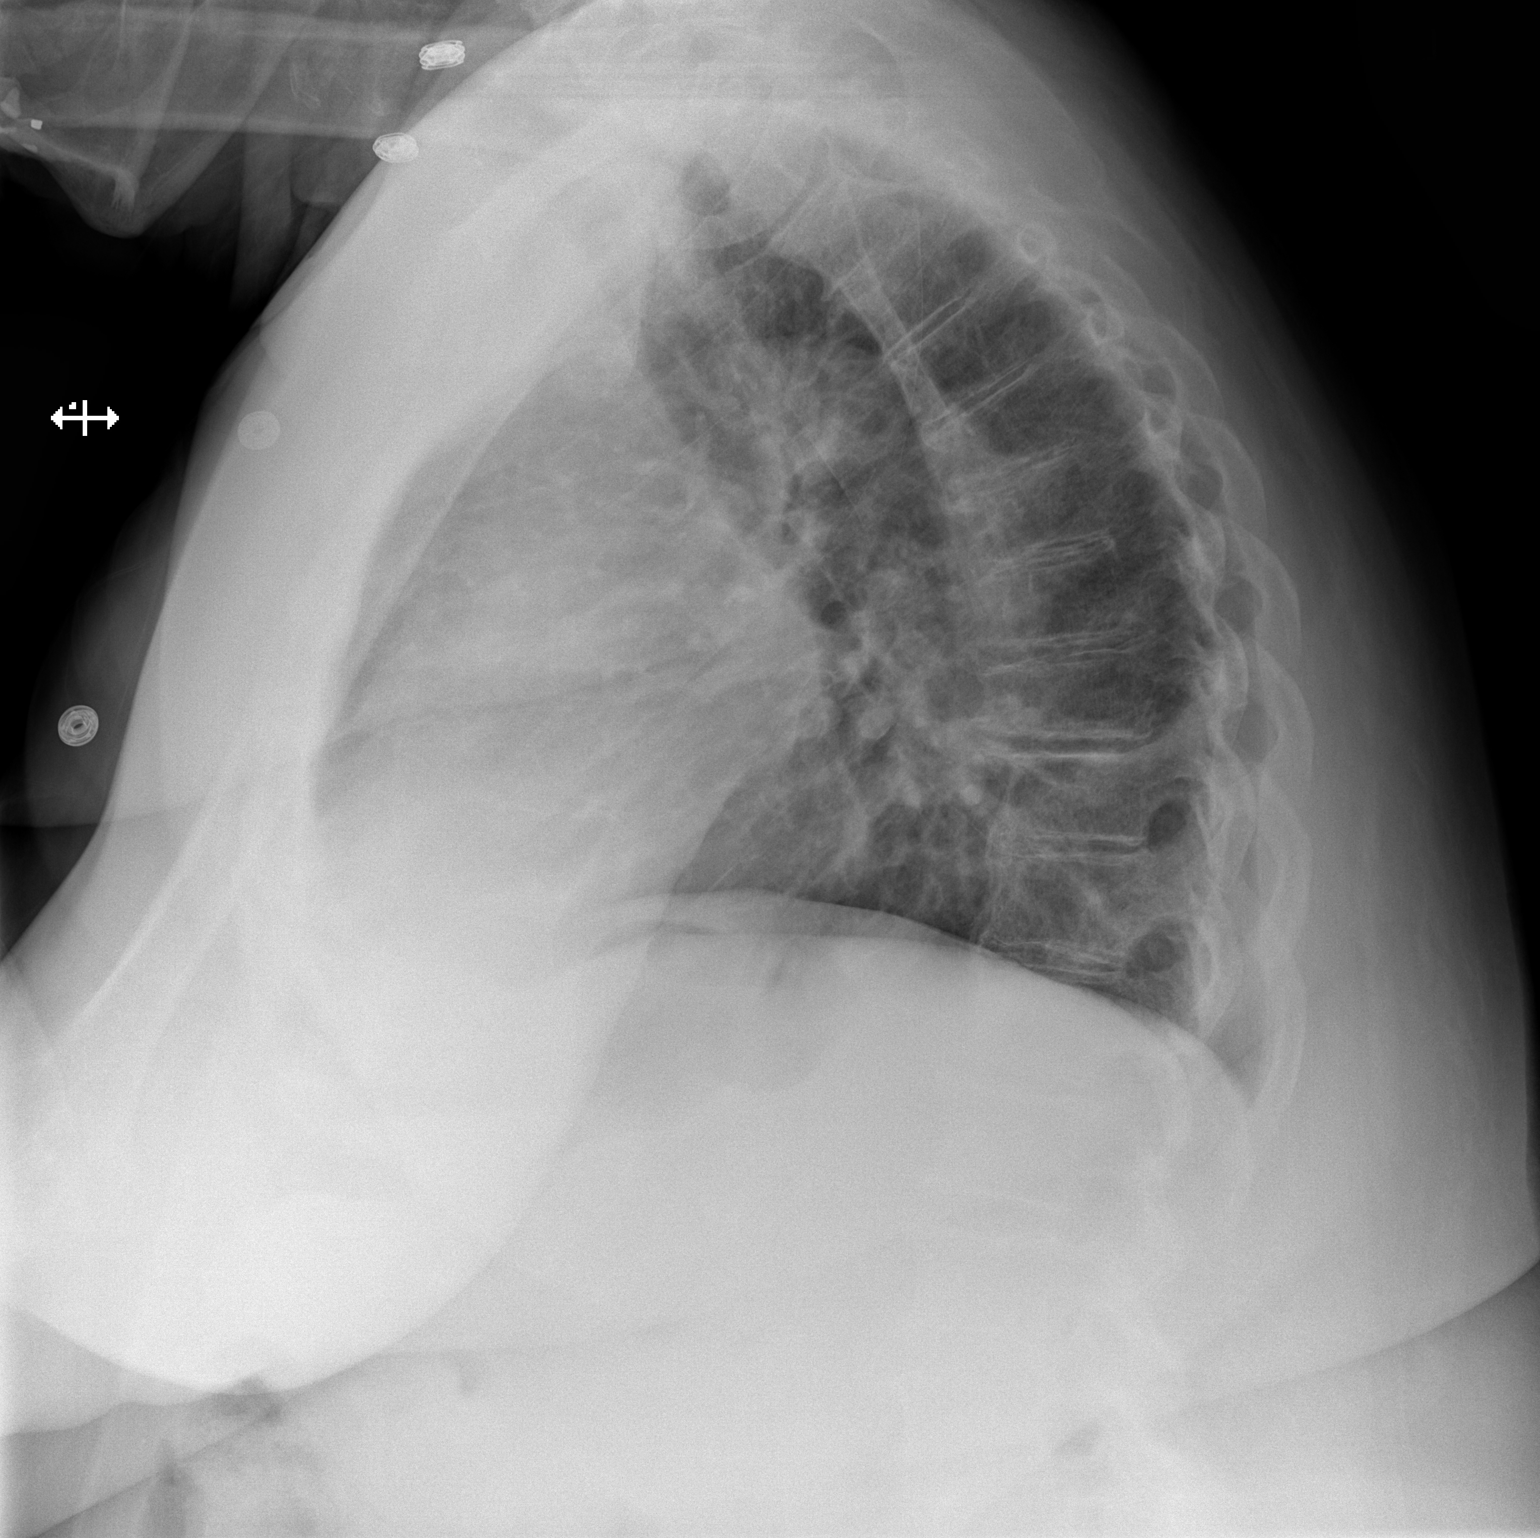

[2 of 2 positions shown; findings below may reference images not displayed]

FINDINGS: The lungs are clear without focal pneumonia, edema, pneumothorax or
pleural effusion. Minimal atelectasis noted left base. The
cardiopericardial silhouette is within normal limits for size. The
visualized bony structures of the thorax show no acute abnormality.
IMPRESSION: No active cardiopulmonary disease.

## 2022-09-22 IMAGING — DX DG CHEST 1V PORT
1 series · 1 of 1 positions shown · non-contrast
Comparison: Chest radiograph dated 05/08/2021.

CLINICAL DATA: Acute on chronic kidney disease.

EXAM:
PORTABLE CHEST 1 VIEW

[chest]
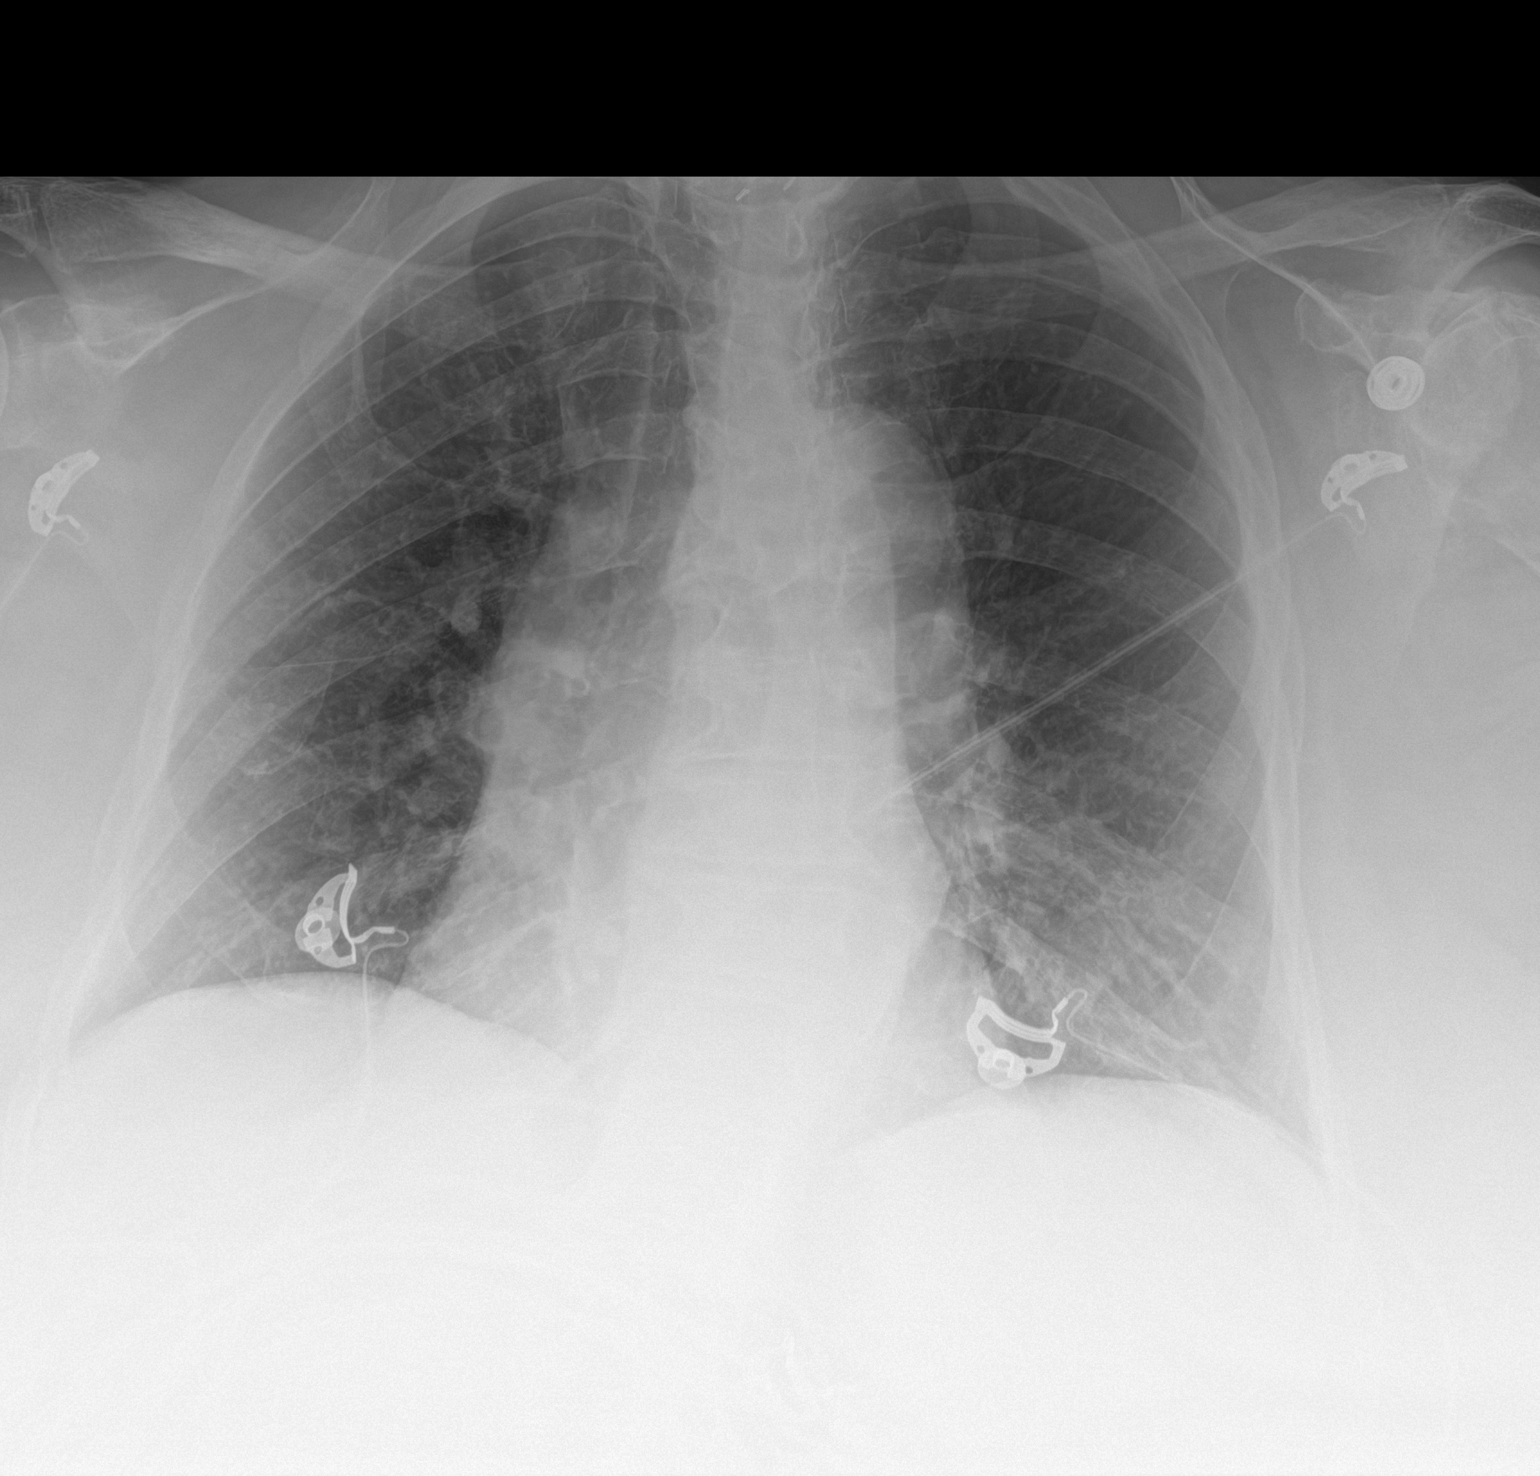

[1 of 1 positions shown; findings below may reference images not displayed]

FINDINGS: No focal consolidation, pleural effusion, pneumothorax. The cardiac
silhouette is within normal limits. No acute osseous pathology.
Degenerative changes of the spine and shoulders. Surgical clips over
the lower neck.
IMPRESSION: No active disease.

## 2022-09-23 ENCOUNTER — Encounter: Payer: Self-pay | Admitting: Pulmonary Disease

## 2022-09-23 NOTE — Telephone Encounter (Signed)
ATC pt left detailed VM (ok per DPR) - instructed pt to call back with any questions or concerns.   CPAP titration has been ordered per VS.

## 2022-10-07 ENCOUNTER — Other Ambulatory Visit: Payer: Self-pay | Admitting: Physician Assistant

## 2022-10-17 ENCOUNTER — Ambulatory Visit (HOSPITAL_BASED_OUTPATIENT_CLINIC_OR_DEPARTMENT_OTHER): Payer: 59 | Attending: Pulmonary Disease | Admitting: Pulmonary Disease

## 2022-10-30 ENCOUNTER — Ambulatory Visit (INDEPENDENT_AMBULATORY_CARE_PROVIDER_SITE_OTHER): Payer: 59 | Admitting: Pulmonary Disease

## 2022-10-30 ENCOUNTER — Encounter: Payer: Self-pay | Admitting: Pulmonary Disease

## 2022-10-30 VITALS — BP 112/84 | HR 87 | Ht 64.0 in | Wt 283.0 lb

## 2022-10-30 DIAGNOSIS — G4733 Obstructive sleep apnea (adult) (pediatric): Secondary | ICD-10-CM | POA: Diagnosis not present

## 2022-10-30 DIAGNOSIS — J4540 Moderate persistent asthma, uncomplicated: Secondary | ICD-10-CM | POA: Diagnosis not present

## 2022-10-30 NOTE — Progress Notes (Signed)
Elizabeth [name]    [id number]    1941-03-18  Primary Care Physician:Nguyen, Selena Batten, NP  Referring Physician: Loura Back, NP [address] [place],  [place] [address]  Chief complaint:   Patient being seen for obstructive sleep apnea  HPI:  Obstructive sleep apnea, tries to use CPAP on a nightly basis Has multiple awakenings at night to use the bathroom   Sleep study showed mild obstructive sleep apnea with AHI of 13.7 with SpO2 of 81%-study was performed 03/11/2022  An overnight oximetry revealed desaturations with CPAP in place -She was unable to complete a CPAP titration  Has been doing relatively well  Denies any pain or discomfort  Limited with activities Only able to walk a few steps Uses a walker  She has a history of chronic right-sided sinusitis  Has been on Symbicort, albuterol, Flonase  Quit over 58 years ago Outpatient Encounter Medications as of 10/30/2022  Medication Sig   acetaminophen (TYLENOL) 325 MG tablet Take 2 tablets (650 mg total) by mouth every 6 (six) hours as needed for mild pain or headache (fever >/= 101).   albuterol (PROVENTIL HFA;VENTOLIN HFA) 108 (90 BASE) MCG/ACT inhaler Inhale 2 puffs into the lungs every 6 (six) hours as needed. For bronchitis   albuterol (PROVENTIL) (2.5 MG/3ML) 0.083% nebulizer solution Take 2.5 mg by nebulization every 6 (six) hours as needed. For bronchitis   Azelastine HCl 0.15 % SOLN PLACE 1 SPRAY INTO THE NOSE 2 (TWO) TIMES DAILY.   budesonide-formoterol (SYMBICORT) 160-4.5 MCG/ACT inhaler Inhale 2 puffs into the lungs 2 (two) times daily.   Cetirizine HCl (ZYRTEC PO) Take by mouth daily.   cyanocobalamin (VITAMIN B12) 1000 MCG tablet Take 1,000 mcg by mouth daily.   diclofenac Sodium (VOLTAREN) 1 % GEL Apply 2 g topically 4 (four) times daily.   donepezil (ARICEPT) 5 MG tablet TAKE 1 TABLET (5 MG TOTAL) BY MOUTH DAILY.   fluticasone (FLONASE) 50 MCG/ACT nasal spray PLACE 1 SPRAY INTO BOTH NOSTRILS 2 (TWO)  TIMES DAILY   GEMTESA 75 MG TABS Take 1 tablet by mouth daily.   hydrALAZINE (APRESOLINE) 10 MG tablet Take 10 mg by mouth 2 (two) times daily.   hydrochlorothiazide (HYDRODIURIL) 25 MG tablet Take 25 mg by mouth daily.   losartan (COZAAR) 100 MG tablet TAKE 1 TABLET BY MOUTH ONCE A DAY TAKE 1 TABLET BY MOUTH ONCE DAILY FOR BLOOD PRESSURE   metFORMIN (GLUCOPHAGE) 500 MG tablet Take 500 mg by mouth 2 (two) times daily with a meal.   Multiple Vitamins-Minerals (CENTRUM SILVER PO) Take 1 tablet by mouth.   nitroGLYCERIN (NITROSTAT) 0.4 MG SL tablet PLEASE SEE ATTACHED FOR DETAILED DIRECTIONS   pantoprazole (PROTONIX) 40 MG tablet Take 40 mg by mouth daily.   potassium chloride (KLOR-CON) 10 MEQ tablet Take 20 mEq by mouth daily.   pravastatin (PRAVACHOL) 10 MG tablet Take 10 mg by mouth at bedtime.   No facility-administered encounter medications on file as of 10/30/2022.    Allergies as of 10/30/2022   (No Known Allergies)    Past Medical History:  Diagnosis Date   Arthritis    Asthma    Breast mass in female    Bronchitis    Bronchitis    Constipation    Cough    GERD (gastroesophageal reflux disease)    Hypertension associated with diabetes (HCC)    Type 2 diabetes mellitus (HCC)    Wheezing     Past Surgical History:  Procedure Laterality Date   ABDOMINAL HYSTERECTOMY  1996   CYSTECTOMY  1980's   back of neck   THYROID SURGERY  2001    Family History  Problem Relation Age of Onset   Diabetes Mother    Heart disease Mother    Cancer Maternal Uncle     Social History   Socioeconomic History   Marital status: Widowed    Spouse name: Not on file   Number of children: 4   Years of education: 65   Highest education level: Not on file  Occupational History   Not on file  Tobacco Use   Smoking status: Former    Types: Cigarettes    Quit date: 01/17/1964    Years since quitting: 58.8   Smokeless tobacco: Never  Vaping Use   Vaping Use: Never used  Substance  and Sexual Activity   Alcohol use: No   Drug use: No   Sexual activity: Not on file  Other Topics Concern   Not on file  Social History Narrative   Right handed   Drinks caffeine   One floor home, son lives with her   Retried   Social Determinants of Health   Financial Resource Strain: Not on file  Food Insecurity: Not on file  Transportation Needs: Not on file  Physical Activity: Not on file  Stress: Not on file  Social Connections: Not on file  Intimate Partner Violence: Not on file    Review of Systems  Respiratory:  Positive for apnea. Negative for shortness of breath.   Psychiatric/Behavioral:  Positive for sleep disturbance.     Vitals:   10/30/22 1351  Pulse: 87  SpO2: 97%     Physical Exam Constitutional:      Appearance: She is obese.  HENT:     Head: Normocephalic.     Mouth/Throat:     Mouth: Mucous membranes are moist.  Cardiovascular:     Rate and Rhythm: Normal rate and regular rhythm.     Heart sounds: No murmur heard.    No friction rub.  Pulmonary:     Effort: No respiratory distress.     Breath sounds: No stridor. No wheezing or rhonchi.  Musculoskeletal:     Cervical back: No rigidity or tenderness.  Neurological:     Mental Status: She is alert.  Psychiatric:        Mood and Affect: Mood normal.    Data Reviewed: Sleep study reviewed  Assessment:  Chronic sinusitis for which he recently saw ENT -Will follow-up with ENT  Obstructive sleep apnea -Encouraged compliance with CPAP therapy  Nocturnal desaturations -Was unable to complete CPAP titration study  The importance of continuing with CPAP on a nightly basis discussed with patient Encouraged to use CPAP for more hours at night  Plan/Recommendations: Graded activities as tolerated  I will follow-up with her in about 6 months  Encouraged to call with significant concerns  Encouraged to make sure she follows up with ENT  Prescription for new nebulizer  I spent 30  minutes dedicated to the care of this patient on the date of this encounter to include previsit review of records, face-to-face time with the patient discussing conditions above, post visit ordering of testing, clinical documentation with electronic health record, making appropriate referrals as documented, and communicated necessary findings to members of the patient's care team   Virl Diamond MD [facility] 10/30/2022, 1:53 PM  CC: Loura Back, NP

## 2022-10-30 NOTE — Patient Instructions (Addendum)
I will see you back in about 6 months  Try and make sure you use your CPAP every night  Graded activities as tolerated  It is important to use your CPAP nightly  Since we cannot find a safe way of getting the oxygen added to the system, I will recommend that you at least ensure that you are using your CPAP  Call with significant concerns

## 2022-11-15 NOTE — Telephone Encounter (Signed)
Patient's daughter states she spoke with Dr. Wynona Neat  And he said okay to bypass sleep study. Patient went to appt and could not get on the bed by herself   Currently on cpap and is good for now

## 2022-11-15 NOTE — Telephone Encounter (Signed)
Patient no showed her sleep study   Sending to be resch and calling pt

## 2022-12-05 ENCOUNTER — Telehealth: Payer: Self-pay | Admitting: Pulmonary Disease

## 2022-12-05 NOTE — Telephone Encounter (Signed)
Dr. Val Eagle can you please advise? Patient should be able to continue using cpap in nursing home?

## 2022-12-05 NOTE — Telephone Encounter (Signed)
[phone] Pt Daughter stopped in to inform us the pt will be going to a nursing facility and will like to know what should she do with her CPAP Machine

## 2022-12-06 NOTE — Telephone Encounter (Signed)
Should be able to continue using CPAP in the nursing facility

## 2022-12-09 NOTE — Telephone Encounter (Signed)
ATC X1 LVM for patients daughter to call the office back

## 2022-12-17 ENCOUNTER — Encounter (HOSPITAL_BASED_OUTPATIENT_CLINIC_OR_DEPARTMENT_OTHER): Payer: 59 | Admitting: Pulmonary Disease

## 2022-12-24 ENCOUNTER — Ambulatory Visit (INDEPENDENT_AMBULATORY_CARE_PROVIDER_SITE_OTHER): Payer: 59 | Admitting: Cardiovascular Disease

## 2022-12-24 ENCOUNTER — Encounter (HOSPITAL_BASED_OUTPATIENT_CLINIC_OR_DEPARTMENT_OTHER): Payer: Self-pay | Admitting: Cardiovascular Disease

## 2022-12-24 VITALS — BP 118/82 | HR 59 | Ht 64.0 in | Wt 283.0 lb

## 2022-12-24 DIAGNOSIS — Z8679 Personal history of other diseases of the circulatory system: Secondary | ICD-10-CM | POA: Diagnosis not present

## 2022-12-24 DIAGNOSIS — I5032 Chronic diastolic (congestive) heart failure: Secondary | ICD-10-CM

## 2022-12-24 DIAGNOSIS — I5030 Unspecified diastolic (congestive) heart failure: Secondary | ICD-10-CM | POA: Insufficient documentation

## 2022-12-24 DIAGNOSIS — E1159 Type 2 diabetes mellitus with other circulatory complications: Secondary | ICD-10-CM

## 2022-12-24 DIAGNOSIS — G4733 Obstructive sleep apnea (adult) (pediatric): Secondary | ICD-10-CM | POA: Diagnosis not present

## 2022-12-24 DIAGNOSIS — I152 Hypertension secondary to endocrine disorders: Secondary | ICD-10-CM

## 2022-12-24 HISTORY — DX: Unspecified diastolic (congestive) heart failure: I50.30

## 2022-12-24 NOTE — Patient Instructions (Signed)
Medication Instructions:  Your physician recommends that you continue on your current medications as directed. Please refer to the Current Medication list given to you today.   *If you need a refill on your cardiac medications before your next appointment, please call your pharmacy*  Lab Work: FASTING LP/CMET/BNP SOON   If you have labs (blood work) drawn today and your tests are completely normal, you will receive your results only by: MyChart Message (if you have MyChart) OR A paper copy in the mail If you have any lab test that is abnormal or we need to change your treatment, we will call you to review the results.  Testing/Procedures: Your physician has requested that you have an echocardiogram. Echocardiography is a painless test that uses sound waves to create images of your heart. It provides your doctor with information about the size and shape of your heart and how well your heart's chambers and valves are working. This procedure takes approximately one hour. There are no restrictions for this procedure. Please do NOT wear cologne, perfume, aftershave, or lotions (deodorant is allowed). Please arrive 15 minutes prior to your appointment time.   Follow-Up: At [facility], you and your health needs are our priority.  As part of our continuing mission to provide you with exceptional heart care, we have created designated Provider Care Teams.  These Care Teams include your primary Cardiologist (physician) and Advanced Practice Providers (APPs -  Physician Assistants and Nurse Practitioners) who all work together to provide you with the care you need, when you need it.  We recommend signing up for the patient portal called "MyChart".  Sign up information is provided on this After Visit Summary.  MyChart is used to connect with patients for Virtual Visits (Telemedicine).  Patients are able to view lab/test results, encounter notes, upcoming appointments, etc.  Non-urgent messages  can be sent to your provider as well.   To learn more about what you can do with MyChart, go to [email or web address].    Your next appointment:   2 month(s)  Provider:   Gillian Shields, NP    Other Instructions WEAR MEDIUM STRENGTH (15-20 mmHg) COMPRESSION STOCKINGS DURING THE DAY

## 2022-12-24 NOTE — Progress Notes (Deleted)
  Cardiology Office Note:  .   Date:  12/24/2022  ID:  Elizabeth [name], DOB 11-20-1940, MRN [id number] PCP: Loura Back, NP  [facility] Providers Cardiologist:  None { Click to update primary MD,subspecialty MD or APP then REFRESH:1}   History of Present Illness: .   Elizabeth [name] is a 82 y.o. female with hypertension, diabetes, OSA on CPAP, GERD, and CKD who is being seen today for the evaluation of *** at the request of Mindi Slicker, NP.  She previously had an echo in 2018 that revealed LVEF 55-60% with mild LVH and grade 1 diastolic dysfunction.  08/2022 her sleep medicine doctor informed her that her oxygen saturations were 79% despite being on CPAP and recommended CPAP titration.  ROS: ***  Studies Reviewed: .        *** Risk Assessment/Calculations:   {Does this patient have ATRIAL FIBRILLATION?[phone]} No BP recorded.  {Refresh Note OR Click here to enter BP  :1}***       Physical Exam:   VS:  There were no vitals taken for this visit.   Wt Readings from Last 3 Encounters:  10/30/22 283 lb (128.4 kg)  09/02/22 284 lb (128.8 kg)  08/13/22 283 lb 9.6 oz (128.6 kg)    GEN: Well nourished, well developed in no acute distress NECK: No JVD; No carotid bruits CARDIAC: ***RRR, no murmurs, rubs, gallops RESPIRATORY:  Clear to auscultation without rales, wheezing or rhonchi  ABDOMEN: Soft, non-tender, non-distended EXTREMITIES:  No edema; No deformity   ASSESSMENT AND PLAN: .   ***    {Are you ordering a CV Procedure (e.g. stress test, cath, DCCV, TEE, etc)?   Press F2        :[id number]}  Dispo: ***  Signed, Chilton Si, MD

## 2022-12-24 NOTE — Progress Notes (Signed)
Cardiology Office Note:  .    Date:  12/24/2022  ID:  Elizabeth [name], DOB 09/07/1940, MRN [id number] PCP: Loura Back, NP  [facility] Providers Cardiologist:  None     History of Present Illness: .    Elizabeth [name] is a 82 y.o. female with hypertension, diabetes, OSA on CPAP, GERD, and CKD, who is being seen today for preoperative cardiovascular risk assessment at the request of Mindi Slicker, NP. Elizabeth [name] is going to pursue endoscopic sinus surgery pending cardiology clearance. Her daughter will contact Dr. Pollyann Kennedy when they are ready to proceed.  She previously had an echo in 2018 that revealed LVEF 55-60% with mild LVH and grade 1 diastolic dysfunction.  08/2022 her sleep medicine doctor informed her that her oxygen saturations were 79% despite being on CPAP and recommended CPAP titration.  Today, she is accompanied by her daughter. Her blood pressure was 120 systolic this morning. In the office today her blood pressure is well controlled at 118/82. She reports having a history of congestive heart failure. At the time she was struggling with fluid in her legs and was following with her PCP. Not seen by cardiology in the past. Currently she has noticed some mild swelling in her feet, wears diabetic socks. Her swelling usually improves by the next morning, and worsens by the end of the day. She also complains of significant leg pain and burning sensations that have been keeping her awake at night which she attributes to neuropathy. Additionally she has severe sleep apnea; she has a CPAP that she uses inconsistently. For exercise she will usually walk and participate in an exercise class daily. She walks down the long hallways and may be a little winded sometimes. Otherwise she denies any anginal symptoms. She denies any palpitations, chest pain, lightheadedness, headaches, syncope, orthopnea, or PND.  ROS:  Please see the history of present illness. All other systems are reviewed and  negative.  (+) Shortness of breath (+) LE pain, burning sensations (+) Mild swelling of feet  Studies Reviewed: Marland Kitchen       EKG 12/24/22: Sinus rhythm.  Rate 59 bpm.  RBBB.  Early R wave progression.  Echo  11/19/2016: Study Conclusions  - Left ventricle: The cavity size was normal. Wall thickness was    increased in a pattern of mild LVH. There was moderate focal    basal hypertrophy of the septum. Systolic function was normal.    The estimated ejection fraction was in the range of 55% to 60%.    Wall motion was normal; there were no regional wall motion    abnormalities. Doppler parameters are consistent with abnormal    left ventricular relaxation (grade 1 diastolic dysfunction).    Doppler parameters are consistent with high ventricular filling    pressure.  - Ascending aorta: The ascending aorta was mildly dilated.  - Mitral valve: Calcified annulus.  - Atrial septum: There was an atrial septal aneurysm.   Impressions:  - Normal LV systolic function; mild LVH with proximal septal    thickening; mild diastolic dysfunction; mildly dilated ascending    aorta.   Risk Assessment/Calculations:             Physical Exam:    VS:  BP 118/82   Pulse (!) 59   Ht 5\' 4"  (1.626 m)   Wt 283 lb (128.4 kg)   BMI 48.58 kg/m  , BMI Body mass index is 48.58 kg/m. GENERAL:  Well appearing HEENT: Pupils equal  round and reactive, fundi not visualized, oral mucosa unremarkable NECK:  No jugular venous distention, waveform within normal limits, carotid upstroke brisk and symmetric, no bruits, no thyromegaly LUNGS:  Clear to auscultation bilaterally HEART:  RRR.  PMI not displaced or sustained,S1 and S2 within normal limits, no S3, no S4, no clicks, no rubs, no murmurs ABD:  Flat, positive bowel sounds normal in frequency in pitch, no bruits, no rebound, no guarding, no midline pulsatile mass, no hepatomegaly, no splenomegaly EXT:  2 plus pulses throughout, 2+ LE edema bilaterally, no cyanosis no  clubbing SKIN:  No rashes no nodules NEURO:  Cranial nerves II through XII grossly intact, motor grossly intact throughout PSYCH:  Cognitively intact, oriented to person place and time  Wt Readings from Last 3 Encounters:  12/24/22 283 lb (128.4 kg)  10/30/22 283 lb (128.4 kg)  09/02/22 284 lb (128.8 kg)     ASSESSMENT AND PLAN: .    # Congestive Heart Failure History of CHF reported by primary care physician. No recent echocardiogram available. No current symptoms of heart failure reported. She has peripheral edema but no JVD or other heart failure symptoms.  Unclear whether this is heart failure or venous insufficiency.  Suspect that her PCP is referring to HFpEF.   -Order echocardiogram to assess current cardiac function.  Grade 1 diastolic dysfunction on echo in 2018. -Order BNP to assess for current heart failure exacerbation. -Recommend use of compression socks for peripheral edema.  # Sleep Apnea Severe sleep apnea reported, but patient is not using CPAP machine regularly. -Emphasize importance of regular CPAP use to manage sleep apnea and reduce strain on heart and lungs.  # Hyperlipidemia No recent cholesterol check available. -Order lipid panel to assess current cholesterol levels.  Continue statin.   # Preoperative Evaluation Patient requires sinus surgery.  Overall low risk surgery.  She has no ischemic symptoms, though her main activity is walking and low-impact exercise classes.  Once cardiac function and volume status are assessed and deemed stable, provide clearance for sinus surgery.      Dispo:  FU with APP in 2 months.   I,Mathew Stumpf,acting as a Neurosurgeon for Chilton Si, MD.,have documented all relevant documentation on the behalf of Chilton Si, MD,as directed by  Chilton Si, MD while in the presence of Chilton Si, MD.  I, Darragh Nay C. [name] Salvia, MD have reviewed all documentation for this visit.  The documentation of the exam, diagnosis,  procedures, and orders on 12/24/2022 are all accurate and complete.   Signed, Chilton Si, MD

## 2023-01-10 ENCOUNTER — Ambulatory Visit (INDEPENDENT_AMBULATORY_CARE_PROVIDER_SITE_OTHER): Payer: 59

## 2023-01-10 DIAGNOSIS — I152 Hypertension secondary to endocrine disorders: Secondary | ICD-10-CM

## 2023-01-10 DIAGNOSIS — E1159 Type 2 diabetes mellitus with other circulatory complications: Secondary | ICD-10-CM

## 2023-01-10 DIAGNOSIS — Z8679 Personal history of other diseases of the circulatory system: Secondary | ICD-10-CM

## 2023-01-10 LAB — ECHOCARDIOGRAM COMPLETE
Area-P 1/2: 3.85 cm2
Calc EF: 45.1 %
MV M vel: 2.41 m/s
MV Peak grad: 23.2 mmHg
Single Plane A2C EF: 47.7 %
Single Plane A4C EF: 40.7 %

## 2023-03-05 ENCOUNTER — Ambulatory Visit: Payer: 59 | Admitting: Physician Assistant

## 2023-03-11 ENCOUNTER — Encounter (HOSPITAL_BASED_OUTPATIENT_CLINIC_OR_DEPARTMENT_OTHER): Payer: Self-pay | Admitting: Family

## 2023-03-11 ENCOUNTER — Ambulatory Visit (INDEPENDENT_AMBULATORY_CARE_PROVIDER_SITE_OTHER): Payer: 59 | Admitting: Family

## 2023-03-11 VITALS — BP 134/80 | HR 72 | Ht 64.0 in | Wt 279.0 lb

## 2023-03-11 DIAGNOSIS — E1159 Type 2 diabetes mellitus with other circulatory complications: Secondary | ICD-10-CM | POA: Diagnosis not present

## 2023-03-11 DIAGNOSIS — I152 Hypertension secondary to endocrine disorders: Secondary | ICD-10-CM

## 2023-03-11 DIAGNOSIS — I10 Essential (primary) hypertension: Secondary | ICD-10-CM

## 2023-03-11 DIAGNOSIS — G4733 Obstructive sleep apnea (adult) (pediatric): Secondary | ICD-10-CM

## 2023-03-11 DIAGNOSIS — I5032 Chronic diastolic (congestive) heart failure: Secondary | ICD-10-CM

## 2023-03-11 DIAGNOSIS — Z8679 Personal history of other diseases of the circulatory system: Secondary | ICD-10-CM

## 2023-03-11 NOTE — Patient Instructions (Addendum)
Medication Instructions:  Your physician recommends that you continue on your current medications as directed. Please refer to the Current Medication list given to you today.  *If you need a refill on your cardiac medications before your next appointment, please call your pharmacy*  Lab Work: CMP, BNP, Direct LDL today  If you have labs (blood work) drawn today and your tests are completely normal, you will receive your results only by: MyChart Message (if you have MyChart) OR A paper copy in the mail If you have any lab test that is abnormal or we need to change your treatment, we will call you to review the results.   Follow-Up: At [facility], you and your health needs are our priority.  As part of our continuing mission to provide you with exceptional heart care, we have created designated Provider Care Teams.  These Care Teams include your primary Cardiologist (physician) and Advanced Practice Providers (APPs -  Physician Assistants and Nurse Practitioners) who all work together to provide you with the care you need, when you need it.  We recommend signing up for the patient portal called "MyChart".  Sign up information is provided on this After Visit Summary.  MyChart is used to connect with patients for Virtual Visits (Telemedicine).  Patients are able to view lab/test results, encounter notes, upcoming appointments, etc.  Non-urgent messages can be sent to your provider as well.   To learn more about what you can do with MyChart, go to [email or web address].    Your next appointment:   4 month(s)  Provider:   Eligha Bridegroom, NP         Other Instructions  To prevent or reduce lower extremity swelling: Eat a low salt diet. Salt makes the body hold onto extra fluid which causes swelling. Sit with legs elevated. For example, in the recliner or on an ottoman.  Wear knee-high compression stockings during the daytime. Ones labeled 15-20 mmHg provide good compression.

## 2023-03-11 NOTE — Progress Notes (Unsigned)
Cardiology Office Note:  .   Date:  03/12/2023  ID:  Elizabeth [name], DOB 1941-04-10, MRN [id number] PCP: Elizabeth Back, NP  [facility] Providers Cardiologist:  None    History of Present Illness: .   Elizabeth [name] is a 82 y.o. female with history of hypertension, diabetes, OSA on CPAP, GERD, CKD.  Established 12/23/2022 with Dr. [name] Salvia for surgical clearance.  Prior echo 2018 LVEF 55 to 60%, mild LVH, grade 1 diastolic dysfunction.  She self-reported a history of congestive heart failure but had not seen cardiology previously.  She was recommended for updated echocardiogram.  She was encouraged to use her CPAP regularly.  She was recommended to use compression socks for peripheral edema.  BMP, lipid panel were recommended but not collected.  Preoperative clearance is granted.  Updated echo 01/2023 LVEF 55 to 60%, grade 2 diastolic dysfunction, RV normal, mildly elevated PASP, mild MR, trivial AI with no stenosis.  Presents today for follow-up with her daughter.  She is residing at [facility].  Plan is to be there long-term due to mold in their home making her unable to return.  Has decided against sinus surgery. Reports a few episodes of hypoglycemia in the 50s, encouraged to discuss with provider at SNF.  She notes mild lightheadedness with position changes-we discussed orthostatic precautions.  She does stay well-hydrated.  Notes her lower extremity edema has been improved and she is wearing compression stockings bilaterally.  No chest pain, pressure, tightness.  Her exertional dyspnea is overall stable at baseline  ROS: Please see the history of present illness.    All other systems reviewed and are negative.   Studies Reviewed: .        Cardiac Studies & Procedures     STRESS TESTS  MYOCARDIAL PERFUSION IMAGING 07/22/2001   ECHOCARDIOGRAM  ECHOCARDIOGRAM COMPLETE 01/10/2023  Narrative ECHOCARDIOGRAM REPORT    Patient Name:   Elizabeth [name] Date of Exam:  01/10/2023 Medical Rec #:  [id number]       Height:       64.0 in Accession #:    [id number]      Weight:       283.0 lb Date of Birth:  [birth year]        BSA:          2.267 m Patient Age:    82 years        BP:           176/105 mmHg Patient Gender: F               HR:           79 bpm. Exam Location:  Outpatient  Procedure: 2D Echo, 3D Echo, Color Doppler, Cardiac Doppler and Strain Analysis  Indications:    Lower extremity edema  History:        Patient has prior history of Echocardiogram examinations, most recent 11/19/2016. Risk Factors:Diabetes, Hypertension and Former Smoker. GERD; HFpEF.  Sonographer:    Elizabeth [name] RDCS Referring Phys: [id number] [facility]   Sonographer Comments: Patient denies any headache or dizziness. Patient got ready in a hurry and forgot to take blood pressure mediciation. She will take them when she gets home and recheck pressure in one hour. IMPRESSIONS   1. Left ventricular ejection fraction, by estimation, is 55 to 60%. The left ventricle has normal function. The left ventricle has no regional wall motion abnormalities. Left ventricular diastolic parameters are consistent with Grade II diastolic  dysfunction (pseudonormalization). 2. Right ventricular systolic function is normal. The right ventricular size is normal. There is mildly elevated pulmonary artery systolic pressure. The estimated right ventricular systolic pressure is 44.2 mmHg. 3. Left atrial size was mildly dilated. 4. The mitral valve is grossly normal. Mild mitral valve regurgitation. No evidence of mitral stenosis. 5. The aortic valve is tricuspid. There is moderate calcification of the aortic valve. There is moderate thickening of the aortic valve. Aortic valve regurgitation is trivial. Aortic valve sclerosis/calcification is present, without any evidence of aortic stenosis. 6. The inferior vena cava is normal in size with greater than 50% respiratory variability, suggesting  right atrial pressure of 3 mmHg.  FINDINGS Left Ventricle: Left ventricular ejection fraction, by estimation, is 55 to 60%. The left ventricle has normal function. The left ventricle has no regional wall motion abnormalities. Global longitudinal strain performed but not reported based on interpreter judgement due to suboptimal tracking. The left ventricular internal cavity size was normal in size. There is no left ventricular hypertrophy. Left ventricular diastolic parameters are consistent with Grade II diastolic dysfunction (pseudonormalization).  Right Ventricle: The right ventricular size is normal. No increase in right ventricular wall thickness. Right ventricular systolic function is normal. There is mildly elevated pulmonary artery systolic pressure. The tricuspid regurgitant velocity is 3.21 m/s, and with an assumed right atrial pressure of 3 mmHg, the estimated right ventricular systolic pressure is 44.2 mmHg.  Left Atrium: Left atrial size was mildly dilated.  Right Atrium: Right atrial size was normal in size.  Pericardium: There is no evidence of pericardial effusion.  Mitral Valve: The mitral valve is grossly normal. Mild mitral valve regurgitation. No evidence of mitral valve stenosis.  Tricuspid Valve: The tricuspid valve is grossly normal. Tricuspid valve regurgitation is trivial. No evidence of tricuspid stenosis.  Aortic Valve: The aortic valve is tricuspid. There is moderate calcification of the aortic valve. There is moderate thickening of the aortic valve. Aortic valve regurgitation is trivial. Aortic valve sclerosis/calcification is present, without any evidence of aortic stenosis.  Pulmonic Valve: The pulmonic valve was grossly normal. Pulmonic valve regurgitation is not visualized. No evidence of pulmonic stenosis.  Aorta: The aortic root and ascending aorta are structurally normal, with no evidence of dilitation.  Venous: The inferior vena cava is normal in size  with greater than 50% respiratory variability, suggesting right atrial pressure of 3 mmHg.  IAS/Shunts: The atrial septum is grossly normal.   LEFT VENTRICLE PLAX 2D LVOT diam:     2.20 cm      Diastology LV SV:         73           LV e' medial:    5.44 cm/s LV SV Index:   32           LV E/e' medial:  20.6 LVOT Area:     3.80 cm     LV e' lateral:   5.44 cm/s LV E/e' lateral: 20.6  LV Volumes (MOD) LV vol d, MOD A2C: 115.0 ml LV vol d, MOD A4C: 138.0 ml LV vol s, MOD A2C: 60.1 ml  3D Volume EF: LV vol s, MOD A4C: 81.8 ml  3D EF:        55 % LV SV MOD A2C:     54.9 ml  LV EDV:       177 ml LV SV MOD A4C:     138.0 ml LV ESV:       80 ml  LV SV MOD BP:      58.6 ml  LV SV:        97 ml  RIGHT VENTRICLE RV Basal diam:  3.78 cm RV Mid diam:    2.96 cm RV S prime:     8.70 cm/s TAPSE (M-mode): 3.0 cm  LEFT ATRIUM             Index        RIGHT ATRIUM           Index LA diam:        4.10 cm 1.81 cm/m   RA Area:     15.50 cm LA Vol (A2C):   71.0 ml 31.31 ml/m  RA Volume:   37.20 ml  16.41 ml/m LA Vol (A4C):   80.4 ml 35.46 ml/m LA Biplane Vol: 75.6 ml 33.34 ml/m AORTIC VALVE LVOT Vmax:   94.30 cm/s LVOT Vmean:  60.800 cm/s LVOT VTI:    0.191 m  AORTA Ao Root diam: 3.60 cm Ao Asc diam:  3.10 cm  MITRAL VALVE                TRICUSPID VALVE MV Area (PHT): 3.85 cm     TR Peak grad:   41.2 mmHg MV Decel Time: 197 msec     TR Vmax:        321.00 cm/s MR Peak grad: 23.2 mmHg MR Vmax:      241.00 cm/s   SHUNTS MV E velocity: 112.00 cm/s  Systemic VTI:  0.19 m MV A velocity: 127.00 cm/s  Systemic Diam: 2.20 cm MV E/A ratio:  0.88  Lennie Odor MD Electronically signed by Lennie Odor MD Signature Date/Time: 01/10/2023/12:13:45 PM    Final             Risk Assessment/Calculations:             Physical Exam:   VS:  BP 134/80   Pulse 72   Ht 5\' 4"  (1.626 m)   Wt 279 lb (126.6 kg)   SpO2 97%   BMI 47.89 kg/m    Wt Readings from Last 3 Encounters:   03/11/23 279 lb (126.6 kg)  12/24/22 283 lb (128.4 kg)  10/30/22 283 lb (128.4 kg)    Vitals:   03/11/23 1532 03/11/23 1540  BP: (!) 152/74 134/80  Pulse: 72   Height: 5\' 4"  (1.626 m)   Weight: 279 lb (126.6 kg)   SpO2: 97%   BMI (Calculated): 47.87     GEN: Well nourished, well developed in no acute distress NECK: No JVD; No carotid bruits CARDIAC: RRR, no murmurs, rubs, gallops RESPIRATORY:  Clear to auscultation without rales, wheezing or rhonchi  ABDOMEN: Soft, non-tender, non-distended EXTREMITIES:  No edema; No deformity   ASSESSMENT AND PLAN: .    Diastolic heart failure / Mild MR-echo 01/2023 normal EF, grade 2 diastolic dysfunction, mild MR.  Monitor with periodic echo as clinically indicated.  Recommend fluid restriction less than 2 L and low-sodium diet.  Lower extremity edema has improved with compression stockings.  No longer requiring Lasix.  Continue present regimen hydrochlorothiazide 25 mg daily, losartan 100 mg daily.  Was previously on olmesartan but anticipate was transition to losartan due to available formulary at her SNF.  We discussed possible addition of SGLT2i but as is feeling overall well she prefers to defer medication changes at this time. Update BNP today.  Cardiovascular risk factor-update CMP and direct LDL today.  OSA-encouraged regular utilization of CPAP.  HTN - initial BP in clinic 152/74 but improved 134/80 without intervention.  Request her SNF contact us if BP routinely greater than 130/80.    DM2-notes episodes of hypoglycemia in the 50s at SNF.  Encouraged to discuss with her provider there.  Notes poor appetite related to dislike of food choices which may be contributing to hypoglycemia.       Dispo: follow up in 4 months. Prefers to follow at [facility] location for convenience, will schedule her follow up with Eligha Bridegroom, NP and route note to Dr. Anne Fu to request change of provider  Signed, Alver Sorrow, NP

## 2023-03-12 ENCOUNTER — Encounter (HOSPITAL_BASED_OUTPATIENT_CLINIC_OR_DEPARTMENT_OTHER): Payer: Self-pay | Admitting: Family

## 2023-03-12 LAB — COMPREHENSIVE METABOLIC PANEL
ALT: 16 [IU]/L (ref 0–32)
AST: 19 [IU]/L (ref 0–40)
Albumin: 4.1 g/dL (ref 3.7–4.7)
Alkaline Phosphatase: 91 [IU]/L (ref 44–121)
BUN/Creatinine Ratio: 16 (ref 12–28)
BUN: 14 mg/dL (ref 8–27)
Bilirubin Total: 0.3 mg/dL (ref 0.0–1.2)
CO2: 25 mmol/L (ref 20–29)
Calcium: 9.6 mg/dL (ref 8.7–10.3)
Chloride: 102 mmol/L (ref 96–106)
Creatinine, Ser: 0.89 mg/dL (ref 0.57–1.00)
Globulin, Total: 3.1 g/dL (ref 1.5–4.5)
Glucose: 92 mg/dL (ref 70–99)
Potassium: 4.5 mmol/L (ref 3.5–5.2)
Sodium: 143 mmol/L (ref 134–144)
Total Protein: 7.2 g/dL (ref 6.0–8.5)
eGFR: 65 mL/min/{1.73_m2} (ref >59)

## 2023-03-12 LAB — LDL CHOLESTEROL, DIRECT: LDL Direct: 53 mg/dL (ref 0–99)

## 2023-03-12 LAB — BRAIN NATRIURETIC PEPTIDE: BNP: 196.9 pg/mL — ABNORMAL HIGH (ref 0.0–100.0)

## 2023-03-14 ENCOUNTER — Telehealth (HOSPITAL_BASED_OUTPATIENT_CLINIC_OR_DEPARTMENT_OTHER): Payer: Self-pay

## 2023-03-14 NOTE — Telephone Encounter (Signed)
Called daughter and discussed lab results and recommendations. She is in agreement with plan; recommendations and lab orders to be faxed to [facility].  ----- Message from Alver Sorrow sent at 03/14/2023  2:31 PM EST ----- Normal kidneys, electrolytes. Cholesterol at goal. BNP shows volume overload. Recommend start Jardiance 10mg  daily. This will help her to not hold onto fluid and reduce risk of kidney disease. Repeat BMP in 2 weeks - we can fax to [facility] to request they do follow up labs and fax results to [place].    Recommend call daughter with recommendations. Will also need to call (and likely fax) to [facility]. Their phone number is [phone].

## 2023-03-14 NOTE — Telephone Encounter (Signed)
-----   Message from Alver Sorrow sent at 03/14/2023  2:31 PM EST ----- Normal kidneys, electrolytes. Cholesterol at goal. BNP shows volume overload. Recommend start Jardiance 10mg  daily. This will help her to not hold onto fluid and reduce risk of kidney disease. Repeat BMP in 2 weeks - we can fax to [facility] to request they do follow up labs and fax results to [place].   Recommend call daughter with recommendations. Will also need to call (and likely fax) to [facility]. Their phone number is [phone].

## 2023-03-17 NOTE — Telephone Encounter (Signed)
Spoke to Huslia, nurse from [facility] and discussed new orders of Jardiance 10mg  daily and BMP in 2 weeks. She verbalized understanding.

## 2023-04-17 ENCOUNTER — Ambulatory Visit: Payer: 59 | Admitting: Physician Assistant

## 2023-04-23 ENCOUNTER — Emergency Department (HOSPITAL_COMMUNITY): Payer: 59

## 2023-04-23 ENCOUNTER — Other Ambulatory Visit: Payer: Self-pay

## 2023-04-23 ENCOUNTER — Inpatient Hospital Stay (HOSPITAL_COMMUNITY)
Admission: EM | Admit: 2023-04-23 | Discharge: 2023-04-28 | DRG: 872 | Disposition: A | Discharge status: Skilled Nursing Facility | Payer: 59 | Source: Skilled Nursing Facility | Attending: Internal Medicine | Admitting: Internal Medicine

## 2023-04-23 ENCOUNTER — Encounter (HOSPITAL_COMMUNITY): Payer: Self-pay

## 2023-04-23 DIAGNOSIS — K224 Dyskinesia of esophagus: Secondary | ICD-10-CM | POA: Diagnosis present

## 2023-04-23 DIAGNOSIS — Z7984 Long term (current) use of oral hypoglycemic drugs: Secondary | ICD-10-CM

## 2023-04-23 DIAGNOSIS — Z833 Family history of diabetes mellitus: Secondary | ICD-10-CM | POA: Diagnosis not present

## 2023-04-23 DIAGNOSIS — N3000 Acute cystitis without hematuria: Secondary | ICD-10-CM | POA: Diagnosis present

## 2023-04-23 DIAGNOSIS — D61818 Other pancytopenia: Secondary | ICD-10-CM | POA: Diagnosis present

## 2023-04-23 DIAGNOSIS — I110 Hypertensive heart disease with heart failure: Secondary | ICD-10-CM | POA: Diagnosis present

## 2023-04-23 DIAGNOSIS — K529 Noninfective gastroenteritis and colitis, unspecified: Secondary | ICD-10-CM | POA: Diagnosis present

## 2023-04-23 DIAGNOSIS — E785 Hyperlipidemia, unspecified: Secondary | ICD-10-CM | POA: Diagnosis present

## 2023-04-23 DIAGNOSIS — K5730 Diverticulosis of large intestine without perforation or abscess without bleeding: Secondary | ICD-10-CM | POA: Diagnosis present

## 2023-04-23 DIAGNOSIS — K219 Gastro-esophageal reflux disease without esophagitis: Secondary | ICD-10-CM | POA: Diagnosis present

## 2023-04-23 DIAGNOSIS — R6520 Severe sepsis without septic shock: Secondary | ICD-10-CM | POA: Diagnosis present

## 2023-04-23 DIAGNOSIS — A419 Sepsis, unspecified organism: Secondary | ICD-10-CM | POA: Diagnosis present

## 2023-04-23 DIAGNOSIS — Z6841 Body Mass Index (BMI) 40.0 and over, adult: Secondary | ICD-10-CM

## 2023-04-23 DIAGNOSIS — I4891 Unspecified atrial fibrillation: Secondary | ICD-10-CM | POA: Diagnosis present

## 2023-04-23 DIAGNOSIS — R680 Hypothermia, not associated with low environmental temperature: Secondary | ICD-10-CM | POA: Diagnosis present

## 2023-04-23 DIAGNOSIS — N179 Acute kidney failure, unspecified: Secondary | ICD-10-CM | POA: Diagnosis present

## 2023-04-23 DIAGNOSIS — Z8249 Family history of ischemic heart disease and other diseases of the circulatory system: Secondary | ICD-10-CM

## 2023-04-23 DIAGNOSIS — E875 Hyperkalemia: Secondary | ICD-10-CM | POA: Diagnosis present

## 2023-04-23 DIAGNOSIS — Z7951 Long term (current) use of inhaled steroids: Secondary | ICD-10-CM

## 2023-04-23 DIAGNOSIS — Z1152 Encounter for screening for COVID-19: Secondary | ICD-10-CM

## 2023-04-23 DIAGNOSIS — J4489 Other specified chronic obstructive pulmonary disease: Secondary | ICD-10-CM | POA: Diagnosis present

## 2023-04-23 DIAGNOSIS — K5909 Other constipation: Secondary | ICD-10-CM | POA: Diagnosis present

## 2023-04-23 DIAGNOSIS — R0600 Dyspnea, unspecified: Secondary | ICD-10-CM | POA: Diagnosis not present

## 2023-04-23 DIAGNOSIS — I5032 Chronic diastolic (congestive) heart failure: Secondary | ICD-10-CM | POA: Diagnosis present

## 2023-04-23 DIAGNOSIS — Z87891 Personal history of nicotine dependence: Secondary | ICD-10-CM | POA: Diagnosis not present

## 2023-04-23 DIAGNOSIS — F0390 Unspecified dementia without behavioral disturbance: Secondary | ICD-10-CM | POA: Diagnosis present

## 2023-04-23 DIAGNOSIS — E1122 Type 2 diabetes mellitus with diabetic chronic kidney disease: Secondary | ICD-10-CM | POA: Diagnosis present

## 2023-04-23 DIAGNOSIS — E871 Hypo-osmolality and hyponatremia: Secondary | ICD-10-CM | POA: Diagnosis present

## 2023-04-23 DIAGNOSIS — Z79899 Other long term (current) drug therapy: Secondary | ICD-10-CM

## 2023-04-23 DIAGNOSIS — Z90710 Acquired absence of both cervix and uterus: Secondary | ICD-10-CM

## 2023-04-23 LAB — CBC WITH DIFFERENTIAL/PLATELET
Abs Immature Granulocytes: 0.04 10*3/uL (ref 0.00–0.07)
Basophils Absolute: 0 10*3/uL (ref 0.0–0.1)
Basophils Relative: 0 %
Eosinophils Absolute: 0.1 10*3/uL (ref 0.0–0.5)
Eosinophils Relative: 2 %
HCT: 34 % — ABNORMAL LOW (ref 36.0–46.0)
Hemoglobin: 10.3 g/dL — ABNORMAL LOW (ref 12.0–15.0)
Immature Granulocytes: 1 %
Lymphocytes Relative: 26 %
Lymphs Abs: 0.8 10*3/uL (ref 0.7–4.0)
MCH: 29.8 pg (ref 26.0–34.0)
MCHC: 30.3 g/dL (ref 30.0–36.0)
MCV: 98.3 fL (ref 80.0–100.0)
Monocytes Absolute: 0.3 10*3/uL (ref 0.1–1.0)
Monocytes Relative: 9 %
Neutro Abs: 1.9 10*3/uL (ref 1.7–7.7)
Neutrophils Relative %: 62 %
Platelets: 112 10*3/uL — ABNORMAL LOW (ref 150–400)
RBC: 3.46 MIL/uL — ABNORMAL LOW (ref 3.87–5.11)
RDW: 16 % — ABNORMAL HIGH (ref 11.5–15.5)
WBC: 3.1 10*3/uL — ABNORMAL LOW (ref 4.0–10.5)
nRBC: 0 % (ref 0.0–0.2)

## 2023-04-23 LAB — RESP PANEL BY RT-PCR (RSV, FLU A&B, COVID)  RVPGX2
Influenza A by PCR: NEGATIVE
Influenza B by PCR: NEGATIVE
Resp Syncytial Virus by PCR: NEGATIVE
SARS Coronavirus 2 by RT PCR: NEGATIVE

## 2023-04-23 LAB — URINALYSIS, W/ REFLEX TO CULTURE (INFECTION SUSPECTED)
Bacteria, UA: NONE SEEN
Bilirubin Urine: NEGATIVE
Glucose, UA: 50 mg/dL — AB
Ketones, ur: NEGATIVE mg/dL
Nitrite: NEGATIVE
Protein, ur: 100 mg/dL — AB
RBC / HPF: >50 RBC/hpf (ref 0–5)
Specific Gravity, Urine: 1.015 (ref 1.005–1.030)
WBC, UA: >50 WBC/hpf (ref 0–5)
pH: 5 (ref 5.0–8.0)

## 2023-04-23 LAB — COMPREHENSIVE METABOLIC PANEL
ALT: 24 U/L (ref 0–44)
AST: 15 U/L (ref 15–41)
Albumin: 3.4 g/dL — ABNORMAL LOW (ref 3.5–5.0)
Alkaline Phosphatase: 74 U/L (ref 38–126)
Anion gap: 4 — ABNORMAL LOW (ref 5–15)
BUN: 25 mg/dL — ABNORMAL HIGH (ref 8–23)
CO2: 24 mmol/L (ref 22–32)
Calcium: 9.2 mg/dL (ref 8.9–10.3)
Chloride: 100 mmol/L (ref 98–111)
Creatinine, Ser: 1.79 mg/dL — ABNORMAL HIGH (ref 0.44–1.00)
GFR, Estimated: 28 mL/min — ABNORMAL LOW (ref >60)
Glucose, Bld: 85 mg/dL (ref 70–99)
Potassium: 5.6 mmol/L — ABNORMAL HIGH (ref 3.5–5.1)
Sodium: 128 mmol/L — ABNORMAL LOW (ref 135–145)
Total Bilirubin: 0.5 mg/dL (ref 0.0–1.2)
Total Protein: 6.9 g/dL (ref 6.5–8.1)

## 2023-04-23 LAB — I-STAT CG4 LACTIC ACID, ED
Lactic Acid, Venous: 0.7 mmol/L (ref 0.5–1.9)
Lactic Acid, Venous: 0.6 mmol/L (ref 0.5–1.9)

## 2023-04-23 LAB — PROTIME-INR
INR: 1 (ref 0.8–1.2)
Prothrombin Time: 13.5 s (ref 11.4–15.2)

## 2023-04-23 LAB — TROPONIN I (HIGH SENSITIVITY): Troponin I (High Sensitivity): 11 ng/L (ref <18)

## 2023-04-23 LAB — T4, FREE: Free T4: 0.9 ng/dL (ref 0.61–1.12)

## 2023-04-23 LAB — MAGNESIUM: Magnesium: 2.2 mg/dL (ref 1.7–2.4)

## 2023-04-23 LAB — TSH: TSH: 6.528 u[IU]/mL — ABNORMAL HIGH (ref 0.350–4.500)

## 2023-04-23 MED ORDER — CHLORHEXIDINE GLUCONATE CLOTH 2 % EX PADS
6.0000 | MEDICATED_PAD | Freq: Every day | CUTANEOUS | Status: DC
  Administered 2023-04-25 – 2023-04-28 (×4): 6 via TOPICAL

## 2023-04-23 MED ORDER — FLUTICASONE FUROATE-VILANTEROL 200-25 MCG/ACT IN AEPB
1.0000 | INHALATION_SPRAY | Freq: Every day | RESPIRATORY_TRACT | Status: DC
  Administered 2023-04-24 – 2023-04-28 (×5): 1 via RESPIRATORY_TRACT
  Filled 2023-04-23: qty 28

## 2023-04-23 MED ORDER — ONDANSETRON HCL 4 MG/2ML IJ SOLN
4.0000 mg | Freq: Four times a day (QID) | INTRAMUSCULAR | Status: DC | PRN
  Administered 2023-04-23 – 2023-04-24 (×2): 4 mg via INTRAVENOUS
  Filled 2023-04-23 (×2): qty 2

## 2023-04-23 MED ORDER — ENOXAPARIN SODIUM 60 MG/0.6ML IJ SOSY
60.0000 mg | PREFILLED_SYRINGE | INTRAMUSCULAR | Status: DC
  Administered 2023-04-23 – 2023-04-24 (×2): 60 mg via SUBCUTANEOUS
  Filled 2023-04-23 (×2): qty 0.6

## 2023-04-23 MED ORDER — LOSARTAN POTASSIUM 50 MG PO TABS: 100.0000 mg | ORAL_TABLET | Freq: Every day | ORAL | Status: DC

## 2023-04-23 MED ORDER — PANTOPRAZOLE SODIUM 40 MG PO TBEC
40.0000 mg | DELAYED_RELEASE_TABLET | Freq: Every day | ORAL | Status: DC
  Administered 2023-04-25 – 2023-04-28 (×4): 40 mg via ORAL
  Filled 2023-04-23 (×4): qty 1

## 2023-04-23 MED ORDER — VANCOMYCIN HCL IN DEXTROSE 1-5 GM/200ML-% IV SOLN
1000.0000 mg | Freq: Once | INTRAVENOUS | Status: AC
  Administered 2023-04-23: 1000 mg via INTRAVENOUS
  Filled 2023-04-23: qty 200

## 2023-04-23 MED ORDER — POLYETHYLENE GLYCOL 3350 17 G PO PACK
17.0000 g | PACK | Freq: Two times a day (BID) | ORAL | Status: DC
  Administered 2023-04-25 – 2023-04-28 (×7): 17 g via ORAL
  Filled 2023-04-23 (×8): qty 1

## 2023-04-23 MED ORDER — ONDANSETRON HCL 4 MG PO TABS: 4.0000 mg | ORAL_TABLET | Freq: Four times a day (QID) | ORAL | Status: DC | PRN

## 2023-04-23 MED ORDER — OXYCODONE HCL 5 MG PO TABS
5.0000 mg | ORAL_TABLET | ORAL | Status: DC | PRN
Start: 2023-04-23 — End: 2023-04-28
  Administered 2023-04-25 – 2023-04-28 (×2): 5 mg via ORAL
  Filled 2023-04-23 (×2): qty 1

## 2023-04-23 MED ORDER — OXYBUTYNIN CHLORIDE ER 10 MG PO TB24
10.0000 mg | ORAL_TABLET | Freq: Every day | ORAL | Status: DC
  Administered 2023-04-25 – 2023-04-28 (×4): 10 mg via ORAL
  Filled 2023-04-23 (×5): qty 1

## 2023-04-23 MED ORDER — SENNA 8.6 MG PO TABS
1.0000 | ORAL_TABLET | Freq: Two times a day (BID) | ORAL | Status: DC
  Administered 2023-04-25 – 2023-04-28 (×7): 8.6 mg via ORAL
  Filled 2023-04-23 (×7): qty 1

## 2023-04-23 MED ORDER — SODIUM CHLORIDE 0.9 % IV SOLN
2.0000 g | Freq: Once | INTRAVENOUS | Status: AC
  Administered 2023-04-23: 2 g via INTRAVENOUS
  Filled 2023-04-23: qty 12.5

## 2023-04-23 MED ORDER — MAGNESIUM CITRATE PO SOLN
1.0000 | Freq: Once | ORAL | Status: DC
  Filled 2023-04-23: qty 296

## 2023-04-23 MED ORDER — HYDROCHLOROTHIAZIDE 12.5 MG PO TABS: 25.0000 mg | ORAL_TABLET | Freq: Every day | ORAL | Status: DC

## 2023-04-23 MED ORDER — SODIUM CHLORIDE 0.9 % IV SOLN: INTRAVENOUS | Status: DC

## 2023-04-23 MED ORDER — SODIUM CHLORIDE 0.9 % IV SOLN
2.0000 g | INTRAVENOUS | Status: AC
  Administered 2023-04-23 – 2023-04-27 (×5): 2 g via INTRAVENOUS
  Filled 2023-04-23 (×5): qty 20

## 2023-04-23 MED ORDER — DONEPEZIL HCL 5 MG PO TABS
5.0000 mg | ORAL_TABLET | Freq: Every day | ORAL | Status: DC
  Administered 2023-04-25 – 2023-04-28 (×4): 5 mg via ORAL
  Filled 2023-04-23 (×4): qty 1

## 2023-04-23 MED ORDER — SODIUM CHLORIDE 0.9 % IV BOLUS
500.0000 mL | Freq: Once | INTRAVENOUS | Status: AC
  Administered 2023-04-23: 500 mL via INTRAVENOUS

## 2023-04-23 MED ORDER — ROSUVASTATIN CALCIUM 5 MG PO TABS
5.0000 mg | ORAL_TABLET | Freq: Every day | ORAL | Status: DC
  Administered 2023-04-25 – 2023-04-28 (×4): 5 mg via ORAL
  Filled 2023-04-23 (×5): qty 1

## 2023-04-23 NOTE — ED Notes (Signed)
 Pt was placed dn a bear hugger due to her temp being 91.9 rectal.

## 2023-04-23 NOTE — ED Notes (Signed)
Unable to get oral or axillary temp.  ?

## 2023-04-23 NOTE — ED Notes (Signed)
 Bear hugger removed.

## 2023-04-23 NOTE — ED Notes (Signed)
 Nurse notified MD that pt may have difficulty with taking PO meds due to difficulty keeping fluids down. MD verbalized understanding.

## 2023-04-23 NOTE — ED Notes (Signed)
 Pt requested water. Nurse gave pt 2oz of water. Once done pt beg retching and stated she could not swallow. Pt proceed to spit out small amount of foamy saliva.

## 2023-04-23 NOTE — ED Notes (Signed)
 Attempted IV, unsuccessful. Blood obtained and sent.

## 2023-04-23 NOTE — H&P (Signed)
 History and Physical    Elizabeth [name] FMW:[id number] DOB: [birth year] DOA: 04/23/2023  PCP: Albina GORMAN Dine, MD   Chief Complaint:  abd pain  HPI: Elizabeth [name] is a 83 y.o. female with medical history significant of heart failure with preserved ejection fraction, GERD, hypertension, type 2 diabetes who presents to the ED due to abdominal pain constipation and suprapubic fullness.  Patient is not a bowel movement in 3 to 7 days.  She is also developed some shortness of breath and cough.  She lives at assisted living facility and was transported to the ER for further assessment.  On arrival she was found to be hypothermic with a temperature of 91.9 and hemodynamically stable.  Labs were obtained which showed WBC 3.1, hemoglobin 10.3, platelets 112, sodium 128, potassium 5.6, creatinine 1.8 baseline 1.3, INR 1.0, lactic acid within normal limits, TSH 6.5, urinalysis concerning for infection.  Chest x-ray was obtained which showed pulmonary venous congestion.  CT abdomen pelvis showed thickening of the colon with diverticulosis, adrenal nodules and cholelithiasis.  Patient was started on IV fluids and broad-spectrum antibiotics admitted for further workup.  On evaluation she was hard to get the story out of that she was mumbling.  She endorsed discomfort in her suprapubic area felt as if she could not pee.  Foley catheter was placed.  She also having difficulty swallowing so esophagram was ordered.  Review of Systems: Review of Systems  Constitutional: Negative.  Negative for fever.  HENT: Negative.    Eyes: Negative.   Respiratory: Negative.    Cardiovascular: Negative.   Gastrointestinal:  Positive for abdominal pain.  Genitourinary:  Positive for hematuria.  Musculoskeletal: Negative.   Skin: Negative.   Neurological: Negative.   Endo/Heme/Allergies: Negative.   All other systems reviewed and are negative.    As per HPI otherwise 10 point review of systems negative.   No Known  Allergies  Past Medical History:  Diagnosis Date   (HFpEF) heart failure with preserved ejection fraction (HCC) 12/24/2022   Arthritis    Asthma    Breast mass in female    Bronchitis    Bronchitis    Constipation    Cough    GERD (gastroesophageal reflux disease)    Hypertension associated with diabetes (HCC)    Type 2 diabetes mellitus (HCC)    Wheezing     Past Surgical History:  Procedure Laterality Date   ABDOMINAL HYSTERECTOMY  1996   CYSTECTOMY  1980's   back of neck   THYROID  SURGERY  2001     reports that she quit smoking about 59 years ago. Her smoking use included cigarettes. She has never used smokeless tobacco. She reports that she does not drink alcohol  and does not use drugs.  Family History  Problem Relation Age of Onset   Diabetes Mother    Heart disease Mother    Cancer Maternal Uncle     Prior to Admission medications   Medication Sig Start Date End Date Taking? Authorizing Provider  acetaminophen  (TYLENOL ) 325 MG tablet Take 2 tablets (650 mg total) by mouth every 6 (six) hours as needed for mild pain or headache (fever >/= 101). 05/14/21   Regalado, Belkys A, MD  albuterol  (PROVENTIL  HFA;VENTOLIN  HFA) 108 (90 BASE) MCG/ACT inhaler Inhale 2 puffs into the lungs every 6 (six) hours as needed. For bronchitis    [provider]  albuterol  (PROVENTIL ) (2.5 MG/3ML) 0.083% nebulizer solution Take 2.5 mg by nebulization every 6 (six) hours as  needed. For bronchitis    [provider]  Azelastine  HCl 0.15 % SOLN PLACE 1 SPRAY INTO THE NOSE 2 (TWO) TIMES DAILY. 08/23/22   Sood, Vineet, MD  budesonide -formoterol  (SYMBICORT) 160-4.5 MCG/ACT inhaler Inhale 2 puffs into the lungs 2 (two) times daily.    [provider]  Cetirizine  HCl (ZYRTEC  PO) Take by mouth daily.    [provider]  cyanocobalamin  (VITAMIN B12) 1000 MCG tablet Take 1,000 mcg by mouth daily.    [provider]  diclofenac  Sodium (VOLTAREN ) 1 % GEL Apply 2  g topically 4 (four) times daily. 04/24/21   [provider]  donepezil  (ARICEPT ) 5 MG tablet TAKE 1 TABLET (5 MG TOTAL) BY MOUTH DAILY. 10/07/22   Wertman, Sara E, PA-C  fluticasone  (FLONASE ) 50 MCG/ACT nasal spray PLACE 1 SPRAY INTO BOTH NOSTRILS 2 (TWO) TIMES DAILY 09/13/22   Shellia Oh, MD  hydrochlorothiazide  (HYDRODIURIL ) 25 MG tablet Take 25 mg by mouth daily.    [provider]  losartan  (COZAAR ) 100 MG tablet Take 100 mg by mouth daily.    [provider]  metFORMIN (GLUCOPHAGE) 500 MG tablet Take 500 mg by mouth 2 (two) times daily with a meal.    [provider]  Multiple Vitamins-Minerals (CENTRUM SILVER PO) Take 1 tablet by mouth.    [provider]  nitroGLYCERIN (NITROSTAT) 0.4 MG SL tablet PLEASE SEE ATTACHED FOR DETAILED DIRECTIONS    [provider]  oxybutynin  (DITROPAN -XL) 10 MG 24 hr tablet Take 10 mg by mouth daily. 12/09/22   [provider]  pantoprazole  (PROTONIX ) 40 MG tablet Take 40 mg by mouth daily.    [provider]  potassium chloride  (KLOR-CON ) 10 MEQ tablet Take 20 mEq by mouth daily.    [provider]  rosuvastatin  (CRESTOR ) 5 MG tablet Take 5 mg by mouth daily. 12/09/22   [provider]    Physical Exam: Vitals:   04/23/23 1345 04/23/23 1400 04/23/23 1415 04/23/23 1519  BP: (!) 140/71 100/70 134/60   Pulse: (!) 52 (!) 55 67 (!) 52  Resp: 17 15 (!) 21 13  Temp:    (!) 94.2 F (34.6 C)  TempSrc:    Rectal  SpO2: 95% 94% 97% 92%  Weight:      Height:       Physical Exam Constitutional:      Appearance: She is obese.  HENT:     Head: Normocephalic.  Cardiovascular:     Rate and Rhythm: Normal rate and regular rhythm.  Pulmonary:     Effort: Pulmonary effort is normal.     Breath sounds: Normal breath sounds.  Abdominal:     General: Abdomen is flat. Bowel sounds are normal.     Palpations: Abdomen is soft.  Skin:    General: Skin is warm.     Capillary  Refill: Capillary refill takes less than 2 seconds.  Neurological:     General: No focal deficit present.     Mental Status: She is alert.  Psychiatric:        Mood and Affect: Mood normal.        Labs on Admission: I have personally reviewed the patients's labs and imaging studies.  Assessment/Plan Principal Problem:   Sepsis (HCC)   # Sepsis secondary to urinary tract infection - Patient has UA with possible UTI - Hypothermic with pancytopenia -Endorsing urinary complaints  Plan: Continue ceftriaxone  Continue IV fluids.  Will be conservative due to hypervolemia Apply bairhugger  #  Atrial fibrillation with slow ventricular response-continue to monitor on telemetry  #Dysphagia- order esophagram  #Colitis on CT- likely infectious. Will check GIPP  # Severe constipation-we will schedule MiraLAX  and senna and administer 1 dose of magnesium  citrate  # Cognitive impairment-continue home donepezil   # Pancytopenia most likely secondary to sepsis-will trend CBC  # AKI-possibly prerenal.  Will continue IV hydration  # COPD-continue home inhalers  # Hypertension-continue hydrochlorothiazide , losartan   # Urinary retention-continue oxybutynin , order foley  # GERD-continue Protonix   # Hyperlipidemia-continue statin  # Hyperkalemia-continue to monitor  # Hyponatremia-continue IV fluids    Admission status: Inpatient Stepdown  Certification: The appropriate patient status for this patient is INPATIENT. Inpatient status is judged to be reasonable and necessary in order to provide the required intensity of service to ensure the patient's safety. The patient's presenting symptoms, physical exam findings, and initial radiographic and laboratory data in the context of their chronic comorbidities is felt to place them at high risk for further clinical deterioration. Furthermore, it is not anticipated that the patient will be medically stable for discharge from the hospital  within 2 midnights of admission.   * I certify that at the point of admission it is my clinical judgment that the patient will require inpatient hospital care spanning beyond 2 midnights from the point of admission due to high intensity of service, high risk for further deterioration and high frequency of surveillance required.DEWAINE Lamar Dess MD [facility] If 7PM-7AM, please contact night-coverage [email or web address]  04/23/2023, 3:25 PM

## 2023-04-23 NOTE — ED Provider Notes (Signed)
 [place] EMERGENCY DEPARTMENT AT [facility] Provider Note   CSN: [id number] Arrival date & time: 04/23/23  1030     History  Chief Complaint  Patient presents with   Abdominal Pain    Elizabeth [name] is a 83 y.o. female.  Patient with history of dementia, chronic kidney disease, diabetes, hypertension, asthma, GERD, HFpEF --presents to the emergency department today for evaluation of abdominal pain, constipation, sensation of urinary retention.  Symptoms have been ongoing for about a week.  Also reports shortness of breath with cough.  Temp 91.9 F rectally on arrival.  Per EMS report, patient from [facility].  They more specifically referenced left upper quadrant pain.       Home Medications Prior to Admission medications   Medication Sig Start Date End Date Taking? Authorizing Provider  acetaminophen  (TYLENOL ) 325 MG tablet Take 2 tablets (650 mg total) by mouth every 6 (six) hours as needed for mild pain or headache (fever >/= 101). 05/14/21   Regalado, Belkys A, MD  albuterol  (PROVENTIL  HFA;VENTOLIN  HFA) 108 (90 BASE) MCG/ACT inhaler Inhale 2 puffs into the lungs every 6 (six) hours as needed. For bronchitis    [provider]  albuterol  (PROVENTIL ) (2.5 MG/3ML) 0.083% nebulizer solution Take 2.5 mg by nebulization every 6 (six) hours as needed. For bronchitis    [provider]  Azelastine  HCl 0.15 % SOLN PLACE 1 SPRAY INTO THE NOSE 2 (TWO) TIMES DAILY. 08/23/22   Sood, Vineet, MD  budesonide -formoterol  (SYMBICORT) 160-4.5 MCG/ACT inhaler Inhale 2 puffs into the lungs 2 (two) times daily.    [provider]  Cetirizine  HCl (ZYRTEC  PO) Take by mouth daily.    [provider]  cyanocobalamin  (VITAMIN B12) 1000 MCG tablet Take 1,000 mcg by mouth daily.    [provider]  diclofenac  Sodium (VOLTAREN ) 1 % GEL Apply 2 g topically 4 (four) times daily. 04/24/21   [provider]  donepezil  (ARICEPT ) 5 MG tablet TAKE 1  TABLET (5 MG TOTAL) BY MOUTH DAILY. 10/07/22   Wertman, Sara E, PA-C  fluticasone  (FLONASE ) 50 MCG/ACT nasal spray PLACE 1 SPRAY INTO BOTH NOSTRILS 2 (TWO) TIMES DAILY 09/13/22   Shellia Oh, MD  hydrochlorothiazide  (HYDRODIURIL ) 25 MG tablet Take 25 mg by mouth daily.    [provider]  losartan  (COZAAR ) 100 MG tablet Take 100 mg by mouth daily.    [provider]  metFORMIN (GLUCOPHAGE) 500 MG tablet Take 500 mg by mouth 2 (two) times daily with a meal.    [provider]  Multiple Vitamins-Minerals (CENTRUM SILVER PO) Take 1 tablet by mouth.    [provider]  nitroGLYCERIN (NITROSTAT) 0.4 MG SL tablet PLEASE SEE ATTACHED FOR DETAILED DIRECTIONS    [provider]  oxybutynin  (DITROPAN -XL) 10 MG 24 hr tablet Take 10 mg by mouth daily. 12/09/22   [provider]  pantoprazole  (PROTONIX ) 40 MG tablet Take 40 mg by mouth daily.    [provider]  potassium chloride  (KLOR-CON ) 10 MEQ tablet Take 20 mEq by mouth daily.    [provider]  rosuvastatin  (CRESTOR ) 5 MG tablet Take 5 mg by mouth daily. 12/09/22   [provider]      Allergies    Patient has no known allergies.    Review of Systems   Review of Systems  Physical Exam Updated Vital Signs BP (!) 175/65 (BP Location: Left Arm)   Pulse (!) 56   Temp (!) 91.9 F (33.3  C) (Rectal)   Resp (!) 22   Ht 5' 4 (1.626 m)   Wt 126.6 kg   SpO2 100%   BMI 47.91 kg/m   Physical Exam Vitals and nursing note reviewed.  Constitutional:      General: She is not in acute distress.    Appearance: She is well-developed.  HENT:     Head: Normocephalic and atraumatic.     Right Ear: External ear normal.     Left Ear: External ear normal.     Nose: Nose normal.  Eyes:     Conjunctiva/sclera: Conjunctivae normal.  Cardiovascular:     Rate and Rhythm: Regular rhythm. Bradycardia present.     Heart sounds: No murmur heard. Pulmonary:     Effort: No  respiratory distress.     Breath sounds: No wheezing, rhonchi or rales.  Abdominal:     Palpations: Abdomen is soft.     Tenderness: There is no abdominal tenderness. There is no guarding or rebound.  Musculoskeletal:     Cervical back: Normal range of motion and neck supple.     Right lower leg: No edema.     Left lower leg: No edema.  Skin:    General: Skin is warm and dry.     Findings: No rash.  Neurological:     General: No focal deficit present.     Mental Status: She is alert. Mental status is at baseline.     Motor: No weakness.  Psychiatric:        Mood and Affect: Mood normal.     ED Results / Procedures / Treatments   Labs (all labs ordered are listed, but only abnormal results are displayed) Labs Reviewed  CBC WITH DIFFERENTIAL/PLATELET - Abnormal; Notable for the following components:      Result Value   WBC 3.1 (*)    RBC 3.46 (*)    Hemoglobin 10.3 (*)    HCT 34.0 (*)    RDW 16.0 (*)    Platelets 112 (*)    All other components within normal limits  COMPREHENSIVE METABOLIC PANEL - Abnormal; Notable for the following components:   Sodium 128 (*)    Potassium 5.6 (*)    BUN 25 (*)    Creatinine, Ser 1.79 (*)    Albumin 3.4 (*)    GFR, Estimated 28 (*)    Anion gap 4 (*)    All other components within normal limits  URINALYSIS, W/ REFLEX TO CULTURE (INFECTION SUSPECTED) - Abnormal; Notable for the following components:   Color, Urine AMBER (*)    APPearance CLOUDY (*)    Glucose, UA 50 (*)    Hgb urine dipstick LARGE (*)    Protein, ur 100 (*)    Leukocytes,Ua SMALL (*)    All other components within normal limits  TSH - Abnormal; Notable for the following components:   TSH 6.528 (*)    All other components within normal limits  RESP PANEL BY RT-PCR (RSV, FLU A&B, COVID)  [id number]  CULTURE, BLOOD (ROUTINE X 2)  CULTURE, BLOOD (ROUTINE X 2)  URINE CULTURE  PROTIME-INR  MAGNESIUM   T4, FREE  I-STAT CG4 LACTIC ACID, ED  I-STAT CG4 LACTIC ACID, ED   TROPONIN I (HIGH SENSITIVITY)    ED ECG REPORT   Date: 04/23/2023  Rate: 40  Rhythm: atrial fibrillation with slow ventricular response  QRS Axis: left  Intervals: normal  ST/T Wave abnormalities: normal  Conduction Disutrbances:right bundle branch block  Narrative Interpretation:  Old EKG Reviewed: changes noted, slower, now regular  I have personally reviewed the EKG tracing and agree with the computerized printout as noted.   Radiology CT ABDOMEN PELVIS WO CONTRAST Result Date: 04/23/2023 CLINICAL DATA:  Abdominal pain. Left upper quadrant pain for 1 week and constipation for 1 week. Urinary retention for 1 week. Patient also as cough and congestion. EXAM: CT ABDOMEN AND PELVIS WITHOUT CONTRAST TECHNIQUE: Multidetector CT imaging of the abdomen and pelvis was performed following the standard protocol without IV contrast. RADIATION DOSE REDUCTION: This exam was performed according to the departmental dose-optimization program which includes automated exposure control, adjustment of the mA and/or kV according to patient size and/or use of iterative reconstruction technique. COMPARISON:  None. FINDINGS: Lower chest: Small bilateral pleural effusions with overlying atelectasis. Hepatobiliary: 1.2 cm low-density structure over the anterior aspect of segment 4 measures 27 Hounsfield units. Subcapsular low-density structure with peripheral calcification is noted overlying the dome of right lobe of liver measuring 20 Hounsfield units and 1.6 cm, image 17/2. Sludge versus tiny stones noted within the dependent portion of the gallbladder, image 40/2. No signs of gallbladder wall inflammation or bile duct dilatation. Pancreas: Unremarkable. No pancreatic ductal dilatation or surrounding inflammatory changes. Spleen: Normal in size without focal abnormality. Adrenals/Urinary Tract: Bilateral adrenal nodules are identified. The right adrenal nodule measures 1.2 cm and 39 Hounsfield units, image 29/2.  Left adrenal nodule measures 1.3 cm and 28 Hounsfield units, image 32/2 within the inferior pole of the right kidney there is a 6 mm stone, image 38/2 within the inferior pole of the left kidney there is an 8 mm stone, image 41/2. No mass or signs of obstructive uropathy identified bilaterally. Urinary bladder is unremarkable. Stomach/Bowel: Stomach appears nondistended. There is no pathologic dilatation of the large or small bowel loops. The appendix is visualized and appears normal. Diffuse colonic diverticulosis is identified. This is most severe at the level of the sigmoid colon. Equivocal wall thickening involving the descending colon and sigmoid colon noted with surrounding mild pericolonic haziness. Findings are equivocal for underlying colitis. No significant free fluid or fluid collections identified. Vascular/Lymphatic: Aortic atherosclerosis. No aneurysm. No signs of abdominopelvic adenopathy. Reproductive: Status post hysterectomy. No adnexal masses. Other: No significant free fluid or fluid collections. No signs of pneumoperitoneum. Fat containing umbilical hernia noted. Musculoskeletal: There are degenerative changes involving both hip joints. Curvature of the lumbar spine is convex towards the right. Lumbar degenerative disc disease noted. No acute or suspicious osseous abnormality. IMPRESSION: 1. Equivocal wall thickening involving the descending colon and sigmoid colon noted with surrounding mild pericolonic haziness. Findings are equivocal for underlying colitis. 2. Diffuse colonic diverticulosis. Most severe at the level of the sigmoid colon. 3. Bilateral nonobstructing renal calculi. 4. Small bilateral pleural effusions with overlying atelectasis. 5. Bilateral adrenal nodules are identified. These are indeterminate. 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 Consider 12 month follow-up adrenal protocol CT. 6. Sludge versus tiny stones noted within the dependent  portion of the gallbladder. No signs of gallbladder wall inflammation or bile duct dilatation. 7. Fat containing umbilical hernia. 8.  Aortic Atherosclerosis (ICD10-I70.0). Electronically Signed   By: Waddell Calk M.D.   On: 04/23/2023 13:43   DG Chest Portable 1 View Result Date: 04/23/2023 CLINICAL DATA:  Cough and shortness of breath. EXAM: PORTABLE CHEST 1 VIEW COMPARISON:  04/22/2021 FINDINGS: The cardiac enlargement and aortic atherosclerosis. Low lung volumes. Pulmonary vascular congestion. No signs of pleural effusion or airspace  consolidation. Visualized osseous structures are unremarkable. IMPRESSION: Cardiac enlargement and pulmonary vascular congestion. Electronically Signed   By: Waddell Calk M.D.   On: 04/23/2023 12:29    Procedures Procedures    Medications Ordered in ED Medications - No data to display  ED Course/ Medical Decision Making/ A&P    Patient seen and examined. History obtained directly from patient.  Patient hypothermic, on Bair hugger.  Labs/EKG: Ordered sepsis workup, troponin, thyroid  studies.  Imaging: Ordered chest x-ray and CT of the abdomen pelvis.  Medications/Fluids: None ordered  Most recent vital signs reviewed and are as follows: BP (!) 175/65 (BP Location: Left Arm)   Pulse (!) 56   Temp (!) 91.9 F (33.3 C) (Rectal)   Resp (!) 22   Ht 5' 4 (1.626 m)   Wt 126.6 kg   SpO2 100%   BMI 47.91 kg/m   Initial impression: Patient with hypothermia and bradycardia with new A-fib, presenting with abdominal pain, constipation and urinary retention over the past 1 week.  Patient currently mentating well.  Blood pressure is elevated.  1:43 PM Pt stable. Abx ordered. Cath performed, urine is cloudy.   2:42 PM Reassessment performed. Patient appears stable.   Labs personally reviewed and interpreted including: CBC with white blood cell count of 3.1, hemoglobin 10.3 and platelets 112, patient has had pancytopenia in the past but slightly worse  today; CMP with sodium 128, potassium elevated at 5.6 with AKI 1.79 and a BUN of 25, normal anion gap; INR ordered as part of sepsis order set to evaluate for endorgan damage normal; lactate normal; respiratory panel negative; magnesium  normal at 2.2; troponin normal at 11; TSH mildly elevated at 6.5, T4 pending; urine compelling for infection.  Most current vital signs reviewed and are as follows: BP 134/60   Pulse 67   Temp (!) 91.9 F (33.3 C) (Rectal)   Resp (!) 21   Ht 5' 4 (1.626 m)   Wt 126.6 kg   SpO2 97%   BMI 47.91 kg/m   Plan: Admit to the hospital  3:06 PM Hospitalist to see.   CRITICAL CARE Performed by: Fonda Ruby PA-C Total critical care time: 40 minutes Critical care time was exclusive of separately billable procedures and treating other patients. Critical care was necessary to treat or prevent imminent or life-threatening deterioration. Critical care was time spent personally by me on the following activities: development of treatment plan with patient and/or surrogate as well as nursing, discussions with consultants, evaluation of patient's response to treatment, examination of patient, obtaining history from patient or surrogate, ordering and performing treatments and interventions, ordering and review of laboratory studies, ordering and review of radiographic studies, pulse oximetry and re-evaluation of patient's condition.                                 Medical Decision Making Amount and/or Complexity of Data Reviewed Labs: ordered. Radiology: ordered.   UTI with sepsis: Patient with constipation and sensation of urinary retention over the past 1 week.  UA grossly infected.  CT scan of the abdomen pelvis concerning for mild colitis, however overall symptomatology more consistent with UTI.  Patient was hypothermic on arrival.  No hypotension.  She does have new AKI likely related to her infection and poor oral intake.  She does have a history of CHF, small  fluid bolus given here due to elevated kidney function.  Fortunately lactate is normal.  Atrial fibrillation, slow ventricular response at times: This appears to be a new problem for the patient and previous cardiology notes do not reference a history of A-fib.  No active blood thinner.   CHA2DS2/VASc Stroke Risk Points: 6  Possible element of hypothyroidism with mildly elevated TSH.  Considered myxedema due to bradycardia and hypothermia, urosepsis more likely.         Final Clinical Impression(s) / ED Diagnoses Final diagnoses:  Sepsis with acute renal failure without septic shock, due to unspecified organism, unspecified acute renal failure type [facility])  Atrial fibrillation, unspecified type (HCC)  Acute cystitis without hematuria    Rx / DC Orders ED Discharge Orders     None         Desiderio Chew, PA-C 04/23/23 1520    Levander Houston, MD 04/28/23 1726

## 2023-04-23 NOTE — ED Notes (Signed)
 Nurse spoke attempted to contact [facility] at [phone] no answer. Nurse contacted pt daughter Duayne (emergency contact)... Pt daughter stated pt has been complaining of abdominal pain and not feeling well for 2 weeks. The past 2 days pt has felt worst and requesting to go to ED. Pt daughter stated pt has dementia and dementia seems to have worsen. Pt daughter stated pt has not urinated or had bowel movement for 2 days. Pt seemed more confused than usual. Pt normally eats and drinks and is alert to self, place and situation and time with some confusion.

## 2023-04-23 NOTE — ED Triage Notes (Addendum)
 Pt BIB EMS from [facility]. Pt reports with LUQ abdominal pain x 1 week ago. Pt has constipation x 1 week and urinary retention x 1 week. Pt has a hx of dementia. Pt also has congestion and cough.

## 2023-04-24 ENCOUNTER — Inpatient Hospital Stay (HOSPITAL_COMMUNITY): Payer: 59

## 2023-04-24 DIAGNOSIS — I4891 Unspecified atrial fibrillation: Secondary | ICD-10-CM | POA: Diagnosis not present

## 2023-04-24 DIAGNOSIS — A419 Sepsis, unspecified organism: Secondary | ICD-10-CM | POA: Diagnosis not present

## 2023-04-24 DIAGNOSIS — K529 Noninfective gastroenteritis and colitis, unspecified: Secondary | ICD-10-CM | POA: Diagnosis present

## 2023-04-24 DIAGNOSIS — N179 Acute kidney failure, unspecified: Secondary | ICD-10-CM | POA: Diagnosis not present

## 2023-04-24 DIAGNOSIS — N3000 Acute cystitis without hematuria: Secondary | ICD-10-CM

## 2023-04-24 LAB — BASIC METABOLIC PANEL
Anion gap: 4 — ABNORMAL LOW (ref 5–15)
Anion gap: 7 (ref 5–15)
BUN: 21 mg/dL (ref 8–23)
BUN: 23 mg/dL (ref 8–23)
CO2: 23 mmol/L (ref 22–32)
CO2: 20 mmol/L — ABNORMAL LOW (ref 22–32)
Calcium: 8.6 mg/dL — ABNORMAL LOW (ref 8.9–10.3)
Calcium: 8.5 mg/dL — ABNORMAL LOW (ref 8.9–10.3)
Chloride: 102 mmol/L (ref 98–111)
Chloride: 103 mmol/L (ref 98–111)
Creatinine, Ser: 1.68 mg/dL — ABNORMAL HIGH (ref 0.44–1.00)
Creatinine, Ser: 1.51 mg/dL — ABNORMAL HIGH (ref 0.44–1.00)
GFR, Estimated: 30 mL/min — ABNORMAL LOW (ref >60)
GFR, Estimated: 34 mL/min — ABNORMAL LOW (ref >60)
Glucose, Bld: 70 mg/dL (ref 70–99)
Glucose, Bld: 70 mg/dL (ref 70–99)
Potassium: 6 mmol/L — ABNORMAL HIGH (ref 3.5–5.1)
Potassium: 5.6 mmol/L — ABNORMAL HIGH (ref 3.5–5.1)
Sodium: 129 mmol/L — ABNORMAL LOW (ref 135–145)
Sodium: 130 mmol/L — ABNORMAL LOW (ref 135–145)

## 2023-04-24 LAB — URINE CULTURE: Culture: NO GROWTH

## 2023-04-24 LAB — CBC
HCT: 32.9 % — ABNORMAL LOW (ref 36.0–46.0)
Hemoglobin: 9.9 g/dL — ABNORMAL LOW (ref 12.0–15.0)
MCH: 29.7 pg (ref 26.0–34.0)
MCHC: 30.1 g/dL (ref 30.0–36.0)
MCV: 98.8 fL (ref 80.0–100.0)
Platelets: 112 10*3/uL — ABNORMAL LOW (ref 150–400)
RBC: 3.33 MIL/uL — ABNORMAL LOW (ref 3.87–5.11)
RDW: 15.8 % — ABNORMAL HIGH (ref 11.5–15.5)
WBC: 3.4 10*3/uL — ABNORMAL LOW (ref 4.0–10.5)
nRBC: 0.6 % — ABNORMAL HIGH (ref 0.0–0.2)

## 2023-04-24 LAB — MRSA NEXT GEN BY PCR, NASAL: MRSA by PCR Next Gen: NOT DETECTED

## 2023-04-24 MED ORDER — HYDRALAZINE HCL 25 MG PO TABS
25.0000 mg | ORAL_TABLET | Freq: Four times a day (QID) | ORAL | Status: DC | PRN
Start: 2023-04-24 — End: 2023-04-28

## 2023-04-24 MED ORDER — SODIUM ZIRCONIUM CYCLOSILICATE 10 G PO PACK
10.0000 g | PACK | Freq: Once | ORAL | Status: AC
  Administered 2023-04-24: 10 g via ORAL
  Filled 2023-04-24: qty 1

## 2023-04-24 MED ORDER — METRONIDAZOLE 500 MG/100ML IV SOLN
500.0000 mg | Freq: Two times a day (BID) | INTRAVENOUS | Status: DC
  Administered 2023-04-24 – 2023-04-28 (×9): 500 mg via INTRAVENOUS
  Filled 2023-04-24 (×9): qty 100

## 2023-04-24 NOTE — Plan of Care (Signed)

## 2023-04-24 NOTE — ED Notes (Signed)
 Holding meds at this time until swallow study is completed.

## 2023-04-24 NOTE — Progress Notes (Addendum)
 [facility]  PROGRESS NOTE  Elizabeth [name] FMW:[id number] DOB: Feb 09, 1941 DOA: 04/23/2023 PCP: Albina GORMAN Dine, MD   Brief HPI:    83 y.o. female with medical history significant of heart failure with preserved ejection fraction, GERD, hypertension, type 2 diabetes who presents to the ED due to abdominal pain constipation and suprapubic fullness.  Patient is not a bowel movement in 3 to 7 days.  She is also developed some shortness of breath and cough.  She lives at assisted living facility and was transported to the ER for further assessment.  On arrival she was found to be hypothermic with a temperature of 91.9 and hemodynamically stable  CT abdomen pelvis showed thickening of the colon with diverticulosis, adrenal nodules and cholelithiasis. Patient was started on IV fluids and broad-spectrum antibiotics admitted for further workup.    Assessment/Plan:   Sepsis -Presented with hypothermia, abnormal UA -Started on IV Rocephin  for possible UTI -Follow urine culture results -Hypothermia resolved after Bair hugger was placed  Dysphagia -Esophagram showed moderate esophageal dysmotility -Will obtain swallow evaluation with speech therapy  Hyperkalemia -Potassium is 6.0 -She was given Lokelma  10 g p.o. times 1 in the morning -Will recheck BMP at this time  Colitis -Seen on CT abdomen/pelvis -Started on ceftriaxone  and Flagyl  -Follow GI pathogen panel  Acute kidney injury -Likely in setting of poor p.o. intake -Started on IV fluids -Will check BMP today  Hyponatremia, mild -Likely in setting of poor p.o. intake -Follow BMP in am  Urinary retention -Likely due to constipation -Foley catheter ordered  Constipation -Continue scheduled MiraLAX , Senokot  Chronic diastolic CHF -Echocardiogram from 01/10/2023 showed EF of 55 to 60% with grade 2 diastolic dysfunction  Atrial fibrillation with slow ventricular response -Seen on EKG at the time of admission -Continue  monitoring on telemetry -CHA2DS2VASc score is 5 -Continue monitoring on telemetry -Called and discussed with cardiology Dr. Alveta, he reviewed EKG and it confirmed that patient has A-fib with slow ventricular response. -Will start her on anticoagulation after discussion with patient's daughter   Medications     Chlorhexidine  Gluconate Cloth  6 each Topical Daily   donepezil   5 mg Oral Daily   enoxaparin  (LOVENOX ) injection  60 mg Subcutaneous Q24H   fluticasone  furoate-vilanterol  1 puff Inhalation Daily   losartan   100 mg Oral Daily   oxybutynin   10 mg Oral Daily   pantoprazole   40 mg Oral Daily   polyethylene glycol  17 g Oral BID   rosuvastatin   5 mg Oral Daily   senna  1 tablet Oral BID     Data Reviewed:   CBG:  No results for input(s): GLUCAP in the last 168 hours.  SpO2: 100 % O2 Flow Rate (L/min): 1 L/min    Vitals:   04/24/23 0843 04/24/23 1000 04/24/23 1008 04/24/23 1440  BP:  (!) 125/52  (!) 119/58  Pulse:  68  64  Resp:  13    Temp:   97.8 F (36.6 C)   TempSrc:   Axillary   SpO2: 98% 98%  100%  Weight:      Height:          Data Reviewed:  Basic Metabolic Panel: Recent Labs  Lab 04/23/23 1055 04/23/23 1149 04/24/23 0419  NA 128*  --  129*  K 5.6*  --  6.0*  CL 100  --  102  CO2 24  --  23  GLUCOSE 85  --  70  BUN 25*  --  21  CREATININE 1.79*  --  1.68*  CALCIUM  9.2  --  8.6*  MG  --  2.2  --     CBC: Recent Labs  Lab 04/23/23 1055 04/24/23 0419  WBC 3.1* 3.4*  NEUTROABS 1.9  --   HGB 10.3* 9.9*  HCT 34.0* 32.9*  MCV 98.3 98.8  PLT 112* 112*    LFT Recent Labs  Lab 04/23/23 1055  AST 15  ALT 24  ALKPHOS 74  BILITOT 0.5  PROT 6.9  ALBUMIN 3.4*     Antibiotics: Anti-infectives (From admission, onward)    Start     Dose/Rate Route Frequency Ordered Stop   04/24/23 0800  metroNIDAZOLE  (FLAGYL ) IVPB 500 mg        500 mg 100 mL/hr over 60 Minutes Intravenous Every 12 hours 04/24/23 0745     04/23/23 2200   cefTRIAXone  (ROCEPHIN ) 2 g in sodium chloride  0.9 % 100 mL IVPB        2 g 200 mL/hr over 30 Minutes Intravenous Every 24 hours 04/23/23 1531 04/28/23 2159   04/23/23 1345  ceFEPIme  (MAXIPIME ) 2 g in sodium chloride  0.9 % 100 mL IVPB        2 g 200 mL/hr over 30 Minutes Intravenous  Once 04/23/23 1335 04/23/23 1451   04/23/23 1345  vancomycin  (VANCOCIN ) IVPB 1000 mg/200 mL premix        1,000 mg 200 mL/hr over 60 Minutes Intravenous  Once 04/23/23 1335 04/23/23 1609        DVT prophylaxis: Lovenox   Code Status: Full code  Family Communication:    CONSULTS    Subjective   Patient seen, esophagram showed moderate esophageal dysmotility without evidence of esophageal obstruction.   Objective    Physical Examination:   General: Appears in no acute distress Cardiovascular: S1-S2, regular Respiratory: Lungs clear to auscultation bilaterally Abdomen: Soft, nontender, no organomegaly Extremities: No edema in the lower extremities Neurologic: Alert, confused at baseline   Status is: Inpatient:             Elizabeth [name]   [facility] If 7PM-7AM, please contact night-coverage at [email or web address], Office  [phone]   04/24/2023, 4:53 PM  LOS: 1 day

## 2023-04-25 DIAGNOSIS — I4891 Unspecified atrial fibrillation: Secondary | ICD-10-CM | POA: Diagnosis not present

## 2023-04-25 DIAGNOSIS — N179 Acute kidney failure, unspecified: Secondary | ICD-10-CM | POA: Diagnosis not present

## 2023-04-25 DIAGNOSIS — K529 Noninfective gastroenteritis and colitis, unspecified: Secondary | ICD-10-CM

## 2023-04-25 DIAGNOSIS — A419 Sepsis, unspecified organism: Secondary | ICD-10-CM | POA: Diagnosis not present

## 2023-04-25 DIAGNOSIS — N3000 Acute cystitis without hematuria: Secondary | ICD-10-CM | POA: Diagnosis not present

## 2023-04-25 LAB — BASIC METABOLIC PANEL
Anion gap: 7 (ref 5–15)
BUN: 22 mg/dL (ref 8–23)
CO2: 22 mmol/L (ref 22–32)
Calcium: 9.2 mg/dL (ref 8.9–10.3)
Chloride: 104 mmol/L (ref 98–111)
Creatinine, Ser: 1.47 mg/dL — ABNORMAL HIGH (ref 0.44–1.00)
GFR, Estimated: 35 mL/min — ABNORMAL LOW (ref >60)
Glucose, Bld: 69 mg/dL — ABNORMAL LOW (ref 70–99)
Potassium: 5.8 mmol/L — ABNORMAL HIGH (ref 3.5–5.1)
Sodium: 133 mmol/L — ABNORMAL LOW (ref 135–145)

## 2023-04-25 MED ORDER — GUAIFENESIN 100 MG/5ML PO LIQD
10.0000 mL | Freq: Four times a day (QID) | ORAL | Status: DC
  Administered 2023-04-25 – 2023-04-28 (×12): 10 mL via ORAL
  Filled 2023-04-25 (×13): qty 10

## 2023-04-25 MED ORDER — SODIUM ZIRCONIUM CYCLOSILICATE 5 G PO PACK
5.0000 g | PACK | Freq: Two times a day (BID) | ORAL | Status: AC
Start: 2023-04-25 — End: 2023-04-25
  Administered 2023-04-25 (×2): 5 g via ORAL
  Filled 2023-04-25 (×2): qty 1

## 2023-04-25 MED ORDER — APIXABAN 2.5 MG PO TABS
2.5000 mg | ORAL_TABLET | Freq: Two times a day (BID) | ORAL | Status: DC
  Administered 2023-04-25 (×2): 2.5 mg via ORAL
  Filled 2023-04-25 (×3): qty 1

## 2023-04-25 MED ORDER — FLUTICASONE PROPIONATE 50 MCG/ACT NA SUSP
1.0000 | Freq: Every day | NASAL | Status: DC
  Administered 2023-04-25 – 2023-04-28 (×4): 1 via NASAL
  Filled 2023-04-25: qty 16

## 2023-04-25 MED ORDER — SALINE SPRAY 0.65 % NA SOLN: 1.0000 | NASAL | Status: DC | PRN

## 2023-04-25 NOTE — Progress Notes (Addendum)
 [facility]  PROGRESS NOTE  Elizabeth [name] FMW:[id number] DOB: [birth year] DOA: 04/23/2023 PCP: Albina GORMAN Dine, MD   Brief HPI:    83 y.o. female with medical history significant of heart failure with preserved ejection fraction, GERD, hypertension, type 2 diabetes who presents to the ED due to abdominal pain constipation and suprapubic fullness.  Patient is not a bowel movement in 3 to 7 days.  She is also developed some shortness of breath and cough.  She lives at assisted living facility and was transported to the ER for further assessment.  On arrival she was found to be hypothermic with a temperature of 91.9 and hemodynamically stable  CT abdomen pelvis showed thickening of the colon with diverticulosis, adrenal nodules and cholelithiasis. Patient was started on IV fluids and broad-spectrum antibiotics admitted for further workup.    Assessment/Plan:   Sepsis -Sepsis physiology has resolved -Presented with hypothermia, abnormal UA -Started on IV Rocephin  for possible UTI -Urine culture showed no growth -Hypothermia resolved after Bair hugger was placed  Dysphagia -Esophagram showed moderate esophageal dysmotility -Will obtain swallow evaluation with speech therapy  Hyperkalemia -Potassium was 6.0, received 10 g of Lokelma  yesterday -Repeat potassium 5.6 -Will recheck BMP this morning -Lokelma  5 mg p.o. twice daily for 2 doses given. -Check BMP in a.m.  Colitis -Seen on CT abdomen/pelvis -Started on ceftriaxone  and Flagyl  -Follow GI pathogen panel  Acute kidney injury -Likely in setting of poor p.o. intake -Started on IV fluids -Creatinine is down to 1.51  Hyponatremia, mild -Likely in setting of poor p.o. intake -Follow BMP in am  Urinary retention -Likely due to constipation -Foley catheter ordered  Constipation -Continue scheduled MiraLAX , Senokot  Chronic diastolic CHF -Echocardiogram from 01/10/2023 showed EF of 55 to 60% with grade 2 diastolic  dysfunction  Atrial fibrillation with slow ventricular response -Seen on EKG at the time of admission -Continue monitoring on telemetry -CHA2DS2VASc score is 5 -Continue monitoring on telemetry -Called and discussed with cardiology Dr. Alveta, he reviewed EKG and it confirmed that patient has A-fib with slow ventricular response. -Discussed with patient's daughter; no contraindication for anticoagulation -Will start Eliquis  per pharmacy   Medications     Chlorhexidine  Gluconate Cloth  6 each Topical Daily   donepezil   5 mg Oral Daily   enoxaparin  (LOVENOX ) injection  60 mg Subcutaneous Q24H   fluticasone  furoate-vilanterol  1 puff Inhalation Daily   oxybutynin   10 mg Oral Daily   pantoprazole   40 mg Oral Daily   polyethylene glycol  17 g Oral BID   rosuvastatin   5 mg Oral Daily   senna  1 tablet Oral BID     Data Reviewed:   CBG:  No results for input(s): GLUCAP in the last 168 hours.  SpO2: 100 % O2 Flow Rate (L/min): 1 L/min    Vitals:   04/24/23 1814 04/24/23 2244 04/25/23 0240 04/25/23 0833  BP: (!) 130/58 (!) 131/50 (!) 141/60   Pulse: (!) 57 (!) 51 (!) 44   Resp: 20 17 19    Temp:  97.9 F (36.6 C)    TempSrc:      SpO2: 100% 100% 100% 100%  Weight:      Height:          Data Reviewed:  Basic Metabolic Panel: Recent Labs  Lab 04/23/23 1055 04/23/23 1149 04/24/23 0419 04/24/23 1940  NA 128*  --  129* 130*  K 5.6*  --  6.0* 5.6*  CL 100  --  102 103  CO2 24  --  23 20*  GLUCOSE 85  --  70 70  BUN 25*  --  21 23  CREATININE 1.79*  --  1.68* 1.51*  CALCIUM  9.2  --  8.6* 8.5*  MG  --  2.2  --   --     CBC: Recent Labs  Lab 04/23/23 1055 04/24/23 0419  WBC 3.1* 3.4*  NEUTROABS 1.9  --   HGB 10.3* 9.9*  HCT 34.0* 32.9*  MCV 98.3 98.8  PLT 112* 112*    LFT Recent Labs  Lab 04/23/23 1055  AST 15  ALT 24  ALKPHOS 74  BILITOT 0.5  PROT 6.9  ALBUMIN 3.4*     Antibiotics: Anti-infectives (From admission, onward)    Start      Dose/Rate Route Frequency Ordered Stop   04/24/23 0800  metroNIDAZOLE  (FLAGYL ) IVPB 500 mg        500 mg 100 mL/hr over 60 Minutes Intravenous Every 12 hours 04/24/23 0745     04/23/23 2200  cefTRIAXone  (ROCEPHIN ) 2 g in sodium chloride  0.9 % 100 mL IVPB        2 g 200 mL/hr over 30 Minutes Intravenous Every 24 hours 04/23/23 1531 04/28/23 2159   04/23/23 1345  ceFEPIme  (MAXIPIME ) 2 g in sodium chloride  0.9 % 100 mL IVPB        2 g 200 mL/hr over 30 Minutes Intravenous  Once 04/23/23 1335 04/23/23 1451   04/23/23 1345  vancomycin  (VANCOCIN ) IVPB 1000 mg/200 mL premix        1,000 mg 200 mL/hr over 60 Minutes Intravenous  Once 04/23/23 1335 04/23/23 1609        DVT prophylaxis: Lovenox   Code Status: Full code  Family Communication: Discussed with patient's daughter at bedside   CONSULTS    Subjective   Coughing up clear phlegm.  Awaiting formal swallow evaluation.   Objective    Physical Examination:   Appears in no acute distress S1-S2, regular Lungs clear to auscultation bilaterally Neuro-alert, confused at baseline   Status is: Inpatient:             [name]   [facility] If 7PM-7AM, please contact night-coverage at [email or web address], Office  [phone]   04/25/2023, 8:57 AM  LOS: 2 days

## 2023-04-25 NOTE — Evaluation (Signed)
 Clinical/Bedside Swallow Evaluation Patient Details  Name: Elizabeth [name] MRN: [id number] Date of Birth: Sep 04, 1940  Today's Date: 04/25/2023 Time: SLP Start Time (ACUTE ONLY): 1055 SLP Stop Time (ACUTE ONLY): 1115 SLP Time Calculation (min) (ACUTE ONLY): 20 min  Past Medical History:  Past Medical History:  Diagnosis Date   (HFpEF) heart failure with preserved ejection fraction (HCC) 12/24/2022   Arthritis    Asthma    Breast mass in female    Bronchitis    Bronchitis    Constipation    Cough    GERD (gastroesophageal reflux disease)    Hypertension associated with diabetes (HCC)    Type 2 diabetes mellitus (HCC)    Wheezing    Past Surgical History:  Past Surgical History:  Procedure Laterality Date   ABDOMINAL HYSTERECTOMY  1996   CYSTECTOMY  1980's   back of neck   THYROID  SURGERY  2001   HPI:  83 y.o. female who presents to the ED due to abdominal pain constipation and suprapubic fullness.  Patient has not had a bowel movement in 3 to 7 days.  She developed some shortness of breath and cough.  She lives at assisted living facility and was transported to the ER for further assessment.  On arrival she was found to be hypothermic with a temperature of 91.9 and hemodynamically stable   CT abdomen pelvis showed thickening of the colon with diverticulosis, adrenal nodules and cholelithiasis. Dx of sepsis, AKI, colitis, possible UTI. Pt with medical history significant of heart failure with preserved ejection fraction, GERD, hypertension, type 2 diabetes. Barium esophagram . Moderate esophageal dysmotility without evidence of esophageal obstruction. Patient was unable to swallow a  barium tablet, and assessment for a distal esophageal stricture is limited.    Assessment / Plan / Recommendation  Clinical Impression  Pt presents with a suspected primary esophageal dysphagia. She has upper denture plate, lower partial and strong volitional cough. Pt expectorating mucous prior to po's  and intermittently pt regurgitated partial amounts of the water. Frequent ecructation present after most swallows. Solid texture was masticated, transited timely without oral residue or regurgitation present. Pt states she tends to have more difficulty with liquids. Towards end of evaluation she had some delayed coughing suspected due to esophageal involvement. Yesterday's barium esophagram revealed moderate esophgeal dysmotility and assessment for a distal esophageal stricture is limited. Recommend continue regular texture, thin liquids, alternate liquids and solids and remain upright 45 min after meals. Pt may benefit from GI consult to further evaluate esophagus. SLP updated MD and RN. SLP updated daughter who arrived and she received phone call from doctor stating they are going to do an endoscopy. ST will follow briefly for precautions. SLP Visit Diagnosis: Dysphagia, unspecified (R13.10)    Aspiration Risk  Mild aspiration risk;Moderate aspiration risk    Diet Recommendation Regular;Thin liquid    Liquid Administration via: Straw;Cup Medication Administration: Crushed with puree Supervision: Patient able to self feed Compensations: Follow solids with liquid Postural Changes: Remain upright for at least 30 minutes after po intake;Seated upright at 90 degrees    Other  Recommendations Recommended Consults: Consider GI evaluation Oral Care Recommendations: Oral care BID    Recommendations for follow up therapy are one component of a multi-disciplinary discharge planning process, led by the attending physician.  Recommendations may be updated based on patient status, additional functional criteria and insurance authorization.  Follow up Recommendations  (TBD)      Assistance Recommended at Discharge    Functional Status  Assessment Patient has had a recent decline in their functional status and demonstrates the ability to make significant improvements in function in a reasonable and  predictable amount of time.  Frequency and Duration min 1 x/week  2 weeks       Prognosis Prognosis for improved oropharyngeal function:  (fair-good)      Swallow Study   General Date of Onset: 04/25/23 HPI: 83 y.o. female who presents to the ED due to abdominal pain constipation and suprapubic fullness.  Patient has not had a bowel movement in 3 to 7 days.  She developed some shortness of breath and cough.  She lives at assisted living facility and was transported to the ER for further assessment.  On arrival she was found to be hypothermic with a temperature of 91.9 and hemodynamically stable   CT abdomen pelvis showed thickening of the colon with diverticulosis, adrenal nodules and cholelithiasis. Dx of sepsis, AKI, colitis, possible UTI. Pt with medical history significant of heart failure with preserved ejection fraction, GERD, hypertension, type 2 diabetes. Barium esophagram . Moderate esophageal dysmotility without evidence of esophageal obstruction. Patient was unable to swallow a  barium tablet, and assessment for a distal esophageal stricture is limited. Type of Study: Bedside Swallow Evaluation Previous Swallow Assessment:  (none) Diet Prior to this Study: Regular;Thin liquids (Level 0) Temperature Spikes Noted: No Respiratory Status: Nasal cannula History of Recent Intubation: No Behavior/Cognition: Alert;Cooperative;Pleasant mood Oral Cavity Assessment: Within Functional Limits Oral Care Completed by SLP: No Oral Cavity - Dentition: Dentures, top;Dentures, bottom;Other (Comment) (partials lower) Vision: Functional for self-feeding Self-Feeding Abilities: Able to feed self Patient Positioning: Upright in chair Baseline Vocal Quality: Normal Volitional Cough: Strong Volitional Swallow: Able to elicit    Oral/Motor/Sensory Function Overall Oral Motor/Sensory Function: Within functional limits   Ice Chips Ice chips: Not tested   Thin Liquid Thin Liquid:  Impaired Presentation: Straw;Cup Oral Phase Impairments:  (none) Pharyngeal  Phase Impairments: Cough - Delayed    Nectar Thick Nectar Thick Liquid: Not tested   Honey Thick Honey Thick Liquid: Not tested   Puree Puree: Within functional limits   Solid     Solid: Within functional limits      Dustin Olam Bull 04/25/2023,11:53 AM

## 2023-04-25 NOTE — Discharge Instructions (Signed)

## 2023-04-25 NOTE — Plan of Care (Signed)

## 2023-04-25 NOTE — Evaluation (Signed)
 Physical Therapy Evaluation Patient Details Name: Elizabeth [name] MRN: [id number] DOB: 10-15-1940 Today's Date: 04/25/2023  History of Present Illness  83 y.o. female who presents to the ED due to abdominal pain constipation and suprapubic fullness.  Patient has not had a bowel movement in 3 to 7 days.  Shedeveloped some shortness of breath and cough.  She lives at assisted living facility and was transported to the ER for further assessment.  On arrival she was found to be hypothermic with a temperature of 91.9 and hemodynamically stable   CT abdomen pelvis showed thickening of the colon with diverticulosis, adrenal nodules and cholelithiasis. Dx of sepsis, AKI, colitis, possible UTI. Pt with medical history significant of heart failure with preserved ejection fraction, GERD, hypertension, type 2 diabetes.  Clinical Impression  Pt admitted with above diagnosis. +2 mod assist sit to stand from elevated bed, pt able to take several pivotal steps with RW to recliner. SpO2 91% on 1L O2 with activity. Pt is not a reliable historian, no family present, however nurse stated pt is from ALF and walks with a RW at baseline. Patient will benefit from continued inpatient follow up therapy, <3 hours/day. Pt currently with functional limitations due to the deficits listed below (see PT Problem List). Pt will benefit from acute skilled PT to increase their independence and safety with mobility to allow discharge.           If plan is discharge home, recommend the following: Two people to help with walking and/or transfers;A lot of help with bathing/dressing/bathroom;Assistance with cooking/housework;Assist for transportation;Help with stairs or ramp for entrance;Direct supervision/assist for medications management;Direct supervision/assist for financial management;Supervision due to cognitive status   Can travel by private vehicle   No    Equipment Recommendations None recommended by PT  Recommendations for  Other Services       Functional Status Assessment Patient has had a recent decline in their functional status and demonstrates the ability to make significant improvements in function in a reasonable and predictable amount of time.     Precautions / Restrictions Precautions Precautions: Fall Precaution Comments: per nurse pt's daughter reported pt fell ~1 month ago; pt denied falls in past 6 months      Mobility  Bed Mobility Overal bed mobility: Needs Assistance Bed Mobility: Supine to Sit     Supine to sit: Max assist, HOB elevated, Used rails     General bed mobility comments: HOB up, max A to raise trunk and pivot hips to EOB    Transfers Overall transfer level: Needs assistance Equipment used: Rolling walker (2 wheels) Transfers: Sit to/from Stand, Bed to chair/wheelchair/BSC Sit to Stand: Mod assist, +2 safety/equipment, From elevated surface           General transfer comment: assist to power up from elevated bed, pt took several pivotal steps from bed to recliner with RW    Ambulation/Gait                  Stairs            Wheelchair Mobility     Tilt Bed    Modified Rankin (Stroke Patients Only)       Balance Overall balance assessment: Needs assistance Sitting-balance support: Feet supported, No upper extremity supported Sitting balance-Leahy Scale: Fair     Standing balance support: Bilateral upper extremity supported, During functional activity, Reliant on assistive device for balance Standing balance-Leahy Scale: Poor  Pertinent Vitals/Pain Pain Assessment Pain Assessment: No/denies pain    Home Living Family/patient expects to be discharged to:: Assisted living                 Home Equipment: Agricultural Consultant (2 wheels);Cane - quad      Prior Function Prior Level of Function : Needs assist;Patient poor historian/Family not available             Mobility Comments: no  family present, pt not a reliable historian       Extremity/Trunk Assessment   Upper Extremity Assessment Upper Extremity Assessment: Generalized weakness    Lower Extremity Assessment Lower Extremity Assessment: RLE deficits/detail;LLE deficits/detail RLE Deficits / Details: knee ext +4/5 RLE Sensation: history of peripheral neuropathy;decreased light touch LLE Deficits / Details: knee ext 4/5 LLE Sensation: decreased light touch;history of peripheral neuropathy    Cervical / Trunk Assessment Cervical / Trunk Assessment: Normal  Communication   Communication Communication: Difficulty communicating thoughts/reduced clarity of speech  Cognition Arousal: Alert, Lethargic Behavior During Therapy: WFL for tasks assessed/performed Overall Cognitive Status: No family/caregiver present to determine baseline cognitive functioning                                 General Comments: mildly lethargic, increased time to respond to questions/commands, not a reliable historian, oriented to self and to location, speech mildly slurred        General Comments General comments (skin integrity, edema, etc.): SpO2 91% on 1L with activity    Exercises     Assessment/Plan    PT Assessment Patient needs continued PT services  PT Problem List Decreased activity tolerance;Decreased mobility;Obesity;Decreased balance       PT Treatment Interventions Functional mobility training;Therapeutic activities;Therapeutic exercise;Balance training;Gait training;Patient/family education    PT Goals (Current goals can be found in the Care Plan section)  Acute Rehab PT Goals PT Goal Formulation: Patient unable to participate in goal setting Time For Goal Achievement: 05/09/23 Potential to Achieve Goals: Fair    Frequency Min 1X/week     Co-evaluation               AM-PAC PT 6 Clicks Mobility  Outcome Measure Help needed turning from your back to your side while in a flat bed  without using bedrails?: A Lot Help needed moving from lying on your back to sitting on the side of a flat bed without using bedrails?: A Lot Help needed moving to and from a bed to a chair (including a wheelchair)?: A Lot Help needed standing up from a chair using your arms (e.g., wheelchair or bedside chair)?: A Lot Help needed to walk in hospital room?: Total Help needed climbing 3-5 steps with a railing? : Total 6 Click Score: 10    End of Session Equipment Utilized During Treatment: Gait belt;Oxygen  Activity Tolerance: Patient tolerated treatment well Patient left: in chair;with call bell/phone within reach;with nursing/sitter in room Nurse Communication: Mobility status;Need for lift equipment PT Visit Diagnosis: Muscle weakness (generalized) (M62.81);Difficulty in walking, not elsewhere classified (R26.2);History of falling (Z91.81)    Time: 9055-8984 PT Time Calculation (min) (ACUTE ONLY): 31 min   Charges:   PT Evaluation $PT Eval Moderate Complexity: 1 Mod PT Treatments $Therapeutic Activity: 8-22 mins PT General Charges $$ ACUTE PT VISIT: 1 Visit         Sylvan Delon Copp PT 04/25/2023  Acute Rehabilitation Services  Office [phone]

## 2023-04-25 NOTE — Consult Note (Signed)
 [facility] Gastroenterology Consult  Referring Provider: No ref. provider found Primary Care Physician:  Albina GORMAN Dine, MD Primary Gastroenterologist: [facility] - Dr. Lennard  Reason for Consultation: Dysphagia  SUBJECTIVE:   HPI: Elizabeth [name] is a 83 y.o. female with past medical history significant for heart failure preserved ejection fraction, dementia, chronic kidney disease, diabetes mellitus.  Presented to hospital on 04/23/2023 for abdominal pain, constipation and urinary retention.  She has been diagnosed with new onset atrial fibrillation and received first dose of Eliquis  on 04/25/2023.  She has known history of dysphagia, has previously seen Dr. Lennard in the office for this.  Underwent barium swallow exam on 12/25/2012 with findings of J-hook configuration of the distal esophagus with no stricture, poor secondary stripping wave.  At follow-up appointment in December 2014, patient had no dysphagia if she watched what she ate/avoiding problematic foods.  No EGD was pursued.  Barium swallow completed 04/24/2023 showed moderate esophageal dysmotility, unable to swallow barium tablet and could not exclude stricture.  CT scan of abdomen pelvis completed 04/23/2023 showed equivocal wall thickening of the descending and sigmoid colon reflecting possible colitis.  She is on antibiotic therapy. She has yet to have a bowel movement.   Today, she she noted that her dysphagia is same as it has been previously.  She denied any solid food dysphagia.  She reported that she has difficulty with swallowing water.  She feels no odynophagia.  No abdominal pain.  She does have abdominal distention as she has not had a bowel movement in 4-5 days.  She has chronic constipation, she believes she uses a laxative outside the hospital though is unable to tell me what medication.  Colonoscopy 06/07/2014 for personal history of colon polyp (Dr. Lennard) showed sigmoid and descending colon diverticulosis, no polyps.  Past Medical  History:  Diagnosis Date   (HFpEF) heart failure with preserved ejection fraction (HCC) 12/24/2022   Arthritis    Asthma    Breast mass in female    Bronchitis    Bronchitis    Constipation    Cough    GERD (gastroesophageal reflux disease)    Hypertension associated with diabetes (HCC)    Type 2 diabetes mellitus (HCC)    Wheezing    Past Surgical History:  Procedure Laterality Date   ABDOMINAL HYSTERECTOMY  1996   CYSTECTOMY  1980's   back of neck   THYROID  SURGERY  2001   Prior to Admission medications   Medication Sig Start Date End Date Taking? Authorizing Provider  acetaminophen  (TYLENOL ) 325 MG tablet Take 2 tablets (650 mg total) by mouth every 6 (six) hours as needed for mild pain or headache (fever >/= 101). 05/14/21  Yes Regalado, Belkys A, MD  Azelastine  HCl 0.15 % SOLN PLACE 1 SPRAY INTO THE NOSE 2 (TWO) TIMES DAILY. 08/23/22  Yes Sood, Vineet, MD  Cetirizine  HCl (ZYRTEC  PO) Take by mouth daily.   Yes [provider]  cyanocobalamin  (VITAMIN B12) 1000 MCG tablet Take 1,000 mcg by mouth daily.   Yes [provider]  diclofenac  Sodium (VOLTAREN ) 1 % GEL Apply 2 g topically 4 (four) times daily. 04/24/21  Yes [provider]  donepezil  (ARICEPT ) 5 MG tablet TAKE 1 TABLET (5 MG TOTAL) BY MOUTH DAILY. 10/07/22  Yes Wertman, Sara E, PA-C  fluticasone  (FLONASE ) 50 MCG/ACT nasal spray PLACE 1 SPRAY INTO BOTH NOSTRILS 2 (TWO) TIMES DAILY 09/13/22  Yes Sood, Vineet, MD  gabapentin  (NEURONTIN ) 100 MG capsule Take 100 mg  by mouth daily. 03/27/23  Yes [provider]  guaiFENesin  (MUCINEX ) 600 MG 12 hr tablet Take 600 mg by mouth 2 (two) times daily. For 5 days starting 04/20/2023   Yes [provider]  hydrALAZINE  (APRESOLINE ) 50 MG tablet Take 50 mg by mouth every 8 (eight) hours as needed (hypertension). 04/10/23  Yes [provider]  hydrochlorothiazide  (HYDRODIURIL ) 25 MG tablet Take 25 mg by mouth daily.   Yes [provider]  JARDIANCE  10 MG TABS tablet Take 10 mg by mouth daily. 04/09/23  Yes [provider]  levofloxacin (LEVAQUIN) 500 MG tablet Take 500 mg by mouth daily. For 6 days starting 04/22/2023   Yes [provider]  lidocaine  (LIDODERM ) 5 % Place 1 patch onto the skin at bedtime. 03/13/23  Yes [provider]  losartan  (COZAAR ) 100 MG tablet Take 100 mg by mouth daily.   Yes [provider]  metFORMIN (GLUCOPHAGE) 500 MG tablet Take 500 mg by mouth 2 (two) times daily with a meal.   Yes [provider]  Multiple Vitamins-Minerals (CENTRUM SILVER PO) Take 1 tablet by mouth.   Yes [provider]  oxybutynin  (DITROPAN -XL) 10 MG 24 hr tablet Take 10 mg by mouth daily. 12/09/22  Yes [provider]  pantoprazole  (PROTONIX ) 40 MG tablet Take 40 mg by mouth daily.   Yes [provider]  potassium chloride  (KLOR-CON ) 10 MEQ tablet Take 20 mEq by mouth daily.   Yes [provider]  Propylene Glycol (SYSTANE COMPLETE OP) Place 1 drop into both eyes 4 (four) times daily as needed (itching).   Yes [provider]  rosuvastatin  (CRESTOR ) 5 MG tablet Take 5 mg by mouth daily. 12/09/22  Yes [provider]  senna (SENOKOT) 8.6 MG tablet Take 2 tablets by mouth daily.   Yes [provider]  albuterol  (PROVENTIL  HFA;VENTOLIN  HFA) 108 (90 BASE) MCG/ACT inhaler Inhale 2 puffs into the lungs every 6 (six) hours as needed. For bronchitis Patient not taking: Reported on 04/24/2023    [provider]  albuterol  (PROVENTIL ) (2.5 MG/3ML) 0.083% nebulizer solution Take 2.5 mg by nebulization every 6 (six) hours as needed. For bronchitis Patient not taking: Reported on 04/24/2023    [provider]  budesonide -formoterol  (SYMBICORT) 160-4.5 MCG/ACT inhaler Inhale 2 puffs into the lungs 2 (two) times daily. Patient not taking: Reported on 04/24/2023    [provider]  nitroGLYCERIN (NITROSTAT) 0.4 MG SL  tablet PLEASE SEE ATTACHED FOR DETAILED DIRECTIONS Patient not taking: Reported on 04/24/2023    [provider]   Current Facility-Administered Medications  Medication Dose Route Frequency Provider Last Rate Last Admin   apixaban  (ELIQUIS ) tablet 2.5 mg  2.5 mg Oral BID Carolee Browning T, RPH   2.5 mg at 04/25/23 9063   cefTRIAXone  (ROCEPHIN ) 2 g in sodium chloride  0.9 % 100 mL IVPB  2 g Intravenous Q24H Dena Charleston, MD   Stopped at 04/24/23 2223   Chlorhexidine  Gluconate Cloth 2 % PADS 6 each  6 each Topical Daily Dena Charleston, MD   6 each at 04/25/23 9182   donepezil  (ARICEPT ) tablet 5 mg  5 mg Oral Daily Dorrell, Robert, MD   5 mg at 04/25/23 9182   fluticasone  (FLONASE ) 50 MCG/ACT nasal spray 1 spray  1 spray Each Nare Daily Drusilla Sabas RAMAN, MD   1 spray at 04/25/23 1202   fluticasone  furoate-vilanterol (BREO ELLIPTA ) 200-25 MCG/ACT 1 puff  1 puff Inhalation Daily Dena Charleston, MD  1 puff at 04/25/23 9166   guaiFENesin  (ROBITUSSIN) 100 MG/5ML liquid 10 mL  10 mL Oral Q6H Drusilla Sabas RAMAN, MD   10 mL at 04/25/23 1202   hydrALAZINE  (APRESOLINE ) tablet 25 mg  25 mg Oral Q6H PRN Lama, Gagan S, MD       metroNIDAZOLE  (FLAGYL ) IVPB 500 mg  500 mg Intravenous Q12H Drusilla Sabas RAMAN, MD 100 mL/hr at 04/25/23 0907 500 mg at 04/25/23 9092   ondansetron  (ZOFRAN ) tablet 4 mg  4 mg Oral Q6H PRN Dena Charleston, MD       Or   ondansetron  (ZOFRAN ) injection 4 mg  4 mg Intravenous Q6H PRN Dena Charleston, MD   4 mg at 04/24/23 9964   oxybutynin  (DITROPAN -XL) 24 hr tablet 10 mg  10 mg Oral Daily Dorrell, Charleston, MD   10 mg at 04/25/23 0818   oxyCODONE  (Oxy IR/ROXICODONE ) immediate release tablet 5 mg  5 mg Oral Q4H PRN Dena Charleston, MD   5 mg at 04/25/23 1200   pantoprazole  (PROTONIX ) EC tablet 40 mg  40 mg Oral Daily Dorrell, Charleston, MD   40 mg at 04/25/23 0818   polyethylene glycol (MIRALAX  / GLYCOLAX ) packet 17 g  17 g Oral BID Dena Charleston, MD   17 g at 04/25/23 0818   rosuvastatin   (CRESTOR ) tablet 5 mg  5 mg Oral Daily Dorrell, Charleston, MD   5 mg at 04/25/23 9180   senna (SENOKOT) tablet 8.6 mg  1 tablet Oral BID Dena Charleston, MD   8.6 mg at 04/25/23 9180   sodium chloride  (OCEAN) 0.65 % nasal spray 1 spray  1 spray Each Nare PRN Drusilla Sabas RAMAN, MD       sodium zirconium cyclosilicate  (LOKELMA ) packet 5 g  5 g Oral BID Drusilla Sabas RAMAN, MD   5 g at 04/25/23 9082   Allergies as of 04/23/2023   (No Known Allergies)   Family History  Problem Relation Age of Onset   Diabetes Mother    Heart disease Mother    Cancer Maternal Uncle    Social History   Socioeconomic History   Marital status: Widowed    Spouse name: Not on file   Number of children: 4   Years of education: 71   Highest education level: Not on file  Occupational History   Not on file  Tobacco Use   Smoking status: Former    Current packs/day: 0.00    Types: Cigarettes    Quit date: 01/17/1964    Years since quitting: 59.3   Smokeless tobacco: Never  Vaping Use   Vaping status: Never Used  Substance and Sexual Activity   Alcohol  use: No   Drug use: No   Sexual activity: Not on file  Other Topics Concern   Not on file  Social History Narrative   Right handed   Drinks caffeine   One floor home, son lives with her   Retried   Social Drivers of Health   Financial Resource Strain: Not on file  Food Insecurity: No Food Insecurity (04/24/2023)   Hunger Vital Sign    Worried About Running Out of Food in the Last Year: Never true    Ran Out of Food in the Last Year: Never true  Transportation Needs: No Transportation Needs (04/24/2023)   PRAPARE - Administrator, Civil Service (Medical): No    Lack of Transportation (Non-Medical): No  Physical Activity: Not on file  Stress: Not on file  Social Connections:  Unknown (04/24/2023)   Social Connection and Isolation Panel [NHANES]    Frequency of Communication with Friends and Family: Patient unable to answer    Frequency of Social  Gatherings with Friends and Family: More than three times a week    Attends Religious Services: Patient unable to answer    Active Member of Clubs or Organizations: Patient unable to answer    Attends Banker Meetings: Patient unable to answer    Marital Status: Patient unable to answer  Intimate Partner Violence: Not At Risk (04/24/2023)   Humiliation, Afraid, Rape, and Kick questionnaire    Fear of Current or Ex-Partner: No    Emotionally Abused: No    Physically Abused: No    Sexually Abused: No   Review of Systems:  Review of Systems  Respiratory:  Negative for shortness of breath.   Cardiovascular:  Negative for chest pain.  Gastrointestinal:  Positive for constipation. Negative for abdominal pain, blood in stool, diarrhea, nausea and vomiting.       Dysphagia to water. No dysphagia to solids.     OBJECTIVE:   Temp:  [97.9 F (36.6 C)] 97.9 F (36.6 C) (01/02 2244) Pulse Rate:  [44-57] 44 (01/03 0240) Resp:  [17-20] 19 (01/03 0240) BP: (130-141)/(50-60) 141/60 (01/03 0240) SpO2:  [100 %] 100 % (01/03 9166)   Physical Exam Constitutional:      General: She is not in acute distress.    Appearance: She is not ill-appearing, toxic-appearing or diaphoretic.  Cardiovascular:     Rate and Rhythm: Regular rhythm. Tachycardia present.  Pulmonary:     Effort: No respiratory distress.     Breath sounds: Normal breath sounds.  Abdominal:     General: Bowel sounds are normal. There is no distension.     Palpations: Abdomen is soft.     Tenderness: There is no abdominal tenderness. There is no guarding.  Skin:    General: Skin is warm and dry.  Neurological:     Mental Status: She is alert.     Labs: Recent Labs    04/23/23 1055 04/24/23 0419  WBC 3.1* 3.4*  HGB 10.3* 9.9*  HCT 34.0* 32.9*  PLT 112* 112*   BMET Recent Labs    04/24/23 0419 04/24/23 1940 04/25/23 1118  NA 129* 130* 133*  K 6.0* 5.6* 5.8*  CL 102 103 104  CO2 23 20* 22  GLUCOSE 70  70 69*  BUN 21 23 22   CREATININE 1.68* 1.51* 1.47*  CALCIUM  8.6* 8.5* 9.2   LFT Recent Labs    04/23/23 1055  PROT 6.9  ALBUMIN 3.4*  AST 15  ALT 24  ALKPHOS 74  BILITOT 0.5   PT/INR Recent Labs    04/23/23 1055  LABPROT 13.5  INR 1.0    Diagnostic imaging: DG ESOPHAGUS W SINGLE CM (SOL OR THIN BA) Result Date: 04/24/2023 CLINICAL DATA:  Dysphagia with difficulty keeping liquids down. Hypothermia and constipation. EXAM: ESOPHOGRAM/BARIUM SWALLOW TECHNIQUE: Single contrast examination was performed using  thin barium. FLUOROSCOPY: Radiation Exposure Index (as provided by the fluoroscopic device): 21.7 mGy Kerma COMPARISON:  Chest radiographs and abdominopelvic CT 04/23/2023. Barium esophagram 12/25/2012. FINDINGS: This is a limited study which was performed in the supine, semi erect and RPO positions. The patient was unable to swallow a barium tablet. Moderate esophageal dysmotility with a decreased primary stripping wave and increased tertiary contractions. Liquid barium flowed freely into the stomach, although there was some stasis of the esophageal contents in the  semi erect position. No mucosal irregularity identified. Difficult to exclude narrowing of the distal esophagus on this limited study. No aspiration or reflux was observed. IMPRESSION: Limited study as described. Moderate esophageal dysmotility without evidence of esophageal obstruction. Patient was unable to swallow a barium tablet, and assessment for a distal esophageal stricture is limited. Electronically Signed   By: Elsie Perone M.D.   On: 04/24/2023 11:00   IMPRESSION: Chronic dysphagia, esophageal dysmotility, no sign of esophageal stricture New onset atrial fibrillation, started on Eliquis  04/25/23 Heart failure with preserved ejection fraction  Type 2 diabetes mellitus Chronic kidney disease Dementia  PLAN: -No signs of solid food dysphagia, patient had consumed entirety of lunch tray at time of my  evaluation, she denied odynophagia, would not pursue EGD  -Would also not pursue EGD at this time as she just started Eliquis  medication therapy for atrial fibrillation -Would continue SLP evaluation/management as it appears her primary complaint is with regurgitation of liquids -Ok for patient to follow up with me in office as an outpatient to discuss dysphagia and consider EGD in the future -[facility] will sign off and be available as needed should any questions arise   LOS: 2 days   Estefana Keas, DO [facility] Gastroenterology

## 2023-04-25 NOTE — Progress Notes (Addendum)
 PHARMACY - ANTICOAGULATION CONSULT NOTE  Pharmacy Consult for apixaban  Indication: atrial fibrillation  No Known Allergies  Patient Measurements: Height: 5' 4 (162.6 cm) Weight: 126.6 kg (279 lb 1.6 oz) IBW/kg (Calculated) : 54.7 Heparin Dosing Weight:   Vital Signs: Temp: 97.9 F (36.6 C) (01/02 2244) BP: 141/60 (01/03 0240) Pulse Rate: 44 (01/03 0240)  Labs: Recent Labs    04/23/23 1055 04/23/23 1149 04/24/23 0419 04/24/23 1940  HGB 10.3*  --  9.9*  --   HCT 34.0*  --  32.9*  --   PLT 112*  --  112*  --   LABPROT 13.5  --   --   --   INR 1.0  --   --   --   CREATININE 1.79*  --  1.68* 1.51*  TROPONINIHS  --  11  --   --     Estimated Creatinine Clearance: 37.9 mL/min (A) (by C-G formula based on SCr of 1.51 mg/dL (H)).   Medical History: Past Medical History:  Diagnosis Date   (HFpEF) heart failure with preserved ejection fraction (HCC) 12/24/2022   Arthritis    Asthma    Breast mass in female    Bronchitis    Bronchitis    Constipation    Cough    GERD (gastroesophageal reflux disease)    Hypertension associated with diabetes (HCC)    Type 2 diabetes mellitus (HCC)    Wheezing     Medications:  Medications Prior to Admission  Medication Sig Dispense Refill Last Dose/Taking   acetaminophen  (TYLENOL ) 325 MG tablet Take 2 tablets (650 mg total) by mouth every 6 (six) hours as needed for mild pain or headache (fever >/= 101). 10 tablet 0 Past Month   Azelastine  HCl 0.15 % SOLN PLACE 1 SPRAY INTO THE NOSE 2 (TWO) TIMES DAILY. 30 mL 2 04/22/2023   Cetirizine  HCl (ZYRTEC  PO) Take by mouth daily.   04/22/2023   cyanocobalamin  (VITAMIN B12) 1000 MCG tablet Take 1,000 mcg by mouth daily.   04/22/2023   diclofenac  Sodium (VOLTAREN ) 1 % GEL Apply 2 g topically 4 (four) times daily.   04/23/2023   donepezil  (ARICEPT ) 5 MG tablet TAKE 1 TABLET (5 MG TOTAL) BY MOUTH DAILY. 90 tablet 1 04/22/2023   fluticasone  (FLONASE ) 50 MCG/ACT nasal spray PLACE 1 SPRAY INTO BOTH  NOSTRILS 2 (TWO) TIMES DAILY 48 mL 2 04/22/2023   gabapentin  (NEURONTIN ) 100 MG capsule Take 100 mg by mouth daily.   04/22/2023   guaiFENesin  (MUCINEX ) 600 MG 12 hr tablet Take 600 mg by mouth 2 (two) times daily. For 5 days starting 04/20/2023   04/23/2023   hydrALAZINE  (APRESOLINE ) 50 MG tablet Take 50 mg by mouth every 8 (eight) hours as needed (hypertension).   04/23/2023   hydrochlorothiazide  (HYDRODIURIL ) 25 MG tablet Take 25 mg by mouth daily.   04/22/2023   JARDIANCE  10 MG TABS tablet Take 10 mg by mouth daily.   04/22/2023   levofloxacin (LEVAQUIN) 500 MG tablet Take 500 mg by mouth daily. For 6 days starting 04/22/2023   04/23/2023   lidocaine  (LIDODERM ) 5 % Place 1 patch onto the skin at bedtime.   04/23/2023   losartan  (COZAAR ) 100 MG tablet Take 100 mg by mouth daily.   04/22/2023   metFORMIN (GLUCOPHAGE) 500 MG tablet Take 500 mg by mouth 2 (two) times daily with a meal.   04/22/2023   Multiple Vitamins-Minerals (CENTRUM SILVER PO) Take 1 tablet by mouth.   04/22/2023   oxybutynin  (DITROPAN -XL)  10 MG 24 hr tablet Take 10 mg by mouth daily.   04/22/2023   pantoprazole  (PROTONIX ) 40 MG tablet Take 40 mg by mouth daily.   04/22/2023   potassium chloride  (KLOR-CON ) 10 MEQ tablet Take 20 mEq by mouth daily.   04/22/2023   Propylene Glycol (SYSTANE COMPLETE OP) Place 1 drop into both eyes 4 (four) times daily as needed (itching).   Taking As Needed   rosuvastatin  (CRESTOR ) 5 MG tablet Take 5 mg by mouth daily.   04/23/2023   senna (SENOKOT) 8.6 MG tablet Take 2 tablets by mouth daily.   04/22/2023   albuterol  (PROVENTIL  HFA;VENTOLIN  HFA) 108 (90 BASE) MCG/ACT inhaler Inhale 2 puffs into the lungs every 6 (six) hours as needed. For bronchitis (Patient not taking: Reported on 04/24/2023)   Not Taking   albuterol  (PROVENTIL ) (2.5 MG/3ML) 0.083% nebulizer solution Take 2.5 mg by nebulization every 6 (six) hours as needed. For bronchitis (Patient not taking: Reported on 04/24/2023)   Not Taking    budesonide -formoterol  (SYMBICORT) 160-4.5 MCG/ACT inhaler Inhale 2 puffs into the lungs 2 (two) times daily. (Patient not taking: Reported on 04/24/2023)   Not Taking   nitroGLYCERIN (NITROSTAT) 0.4 MG SL tablet PLEASE SEE ATTACHED FOR DETAILED DIRECTIONS (Patient not taking: Reported on 04/24/2023)   Not Taking    Assessment: 83 yo F admitted with abdominal pain, constipation and suprapubic fullness.  Pharmacy consulted to dose apixaban  for Afib.  Hg 10.3> 9.9 PLT 112  Age > 80 Wt 126.6 kg AKI SCr > = 1.5  (1.79> 1.68> 1.51 down trending, was 0.89 02/2023)   Goal of Therapy:  Monitor platelets by anticoagulation protocol: Yes   Plan:  DC LMWH 60 q24 for VTE px Start Apixaban  2.5 mg po bid- currently all PO meds charted as hold pending SLP eval F/u AM SCr - may be able to increase dose to 5 mg po bid Will educate and provide 30 day free card prior to discharge  Rosaline IVAR Edison, Pharm.D Use secure chat for questions 04/25/2023 9:32 AM

## 2023-04-25 NOTE — Evaluation (Signed)
 Occupational Therapy Evaluation Patient Details Name: Elizabeth [name] MRN: [id number] DOB: [birth year] Today's Date: 04/25/2023   History of Present Illness 83 y.o. female who presents to the ED due to abdominal pain constipation and suprapubic fullness.  Patient has not had a bowel movement in 3 to 7 days.  Shedeveloped some shortness of breath and cough.  She lives at assisted living facility and was transported to the ER for further assessment.  On arrival she was found to be hypothermic with a temperature of 91.9 and hemodynamically stable   CT abdomen pelvis showed thickening of the colon with diverticulosis, adrenal nodules and cholelithiasis. Dx of sepsis, AKI, colitis, possible UTI. Pt with medical history significant of heart failure with preserved ejection fraction, GERD, hypertension, type 2 diabetes.   Clinical Impression   Pt presents with decline in function and safety with ADLs and ADL mobility with impaired strength, balance and endurance; pt with hx of cognitive impairments. Pt is not a reliable historian, no family present, however nurse stated pt is from ALF and walks with a RW at baseline. Pt reports that aides assists her with all ADLs except grooming and toileting. Pt currently requires max A sit - stand/SPTs, max A for transfers and toileting tasks, is at baseline total A for bathing and dressing. Pt would benefit from acute OT services to address impairments to maximize level of function and safety      If plan is discharge home, recommend the following: A lot of help with bathing/dressing/bathroom;A lot of help with walking and/or transfers;Two people to help with walking and/or transfers;Supervision due to cognitive status    Functional Status Assessment  Patient has had a recent decline in their functional status and demonstrates the ability to make significant improvements in function in a reasonable and predictable amount of time.  Equipment Recommendations  None  recommended by OT    Recommendations for Other Services       Precautions / Restrictions Precautions Precautions: Fall Precaution Comments: per nurse pt's daughter reported pt fell ~1 month ago; pt denied falls in past 6 months Restrictions Weight Bearing Restrictions Per Provider Order: No      Mobility Bed Mobility               General bed mobility comments: pt in recliner upon OT arrival    Transfers Overall transfer level: Needs assistance Equipment used: Rolling walker (2 wheels) Transfers: Sit to/from Stand, Bed to chair/wheelchair/BSC Sit to Stand: Max assist           General transfer comment: max A sit - stand for recliner, SPT to EOB and back to recliner      Balance Overall balance assessment: Needs assistance Sitting-balance support: Feet supported, No upper extremity supported Sitting balance-Leahy Scale: Fair     Standing balance support: Bilateral upper extremity supported, During functional activity, Reliant on assistive device for balance Standing balance-Leahy Scale: Poor                             ADL either performed or assessed with clinical judgement   ADL Overall ADL's : Needs assistance/impaired Eating/Feeding: Set up;Sitting   Grooming: Wash/dry hands;Wash/dry face;Contact guard assist;Sitting                   Statistician: Maximal Dentist Details (indicate cue type and reason): simulated Toileting- Clothing Manipulation and Hygiene: Maximal assistance       Functional  mobility during ADLs: Maximal assistance       Vision Ability to See in Adequate Light: 0 Adequate Patient Visual Report: No change from baseline       Perception         Praxis         Pertinent Vitals/Pain Pain Assessment Pain Assessment: No/denies pain     Extremity/Trunk Assessment Upper Extremity Assessment Upper Extremity Assessment: Generalized weakness   Lower Extremity Assessment Lower  Extremity Assessment: Defer to PT evaluation RLE Deficits / Details: knee ext +4/5 RLE Sensation: history of peripheral neuropathy;decreased light touch LLE Deficits / Details: knee ext 4/5 LLE Sensation: decreased light touch;history of peripheral neuropathy   Cervical / Trunk Assessment Cervical / Trunk Assessment: Normal   Communication Communication Communication: Difficulty communicating thoughts/reduced clarity of speech Cueing Techniques: Verbal cues   Cognition Arousal: Alert, Lethargic Behavior During Therapy: WFL for tasks assessed/performed Overall Cognitive Status: No family/caregiver present to determine baseline cognitive functioning                                 General Comments: increased time to respond to questions/commands, not a reliable historian, oriented to self and to location     General Comments  SpO2 91% on 1L with activity    Exercises     Shoulder Instructions      Home Living Family/patient expects to be discharged to:: Assisted living                             Home Equipment: Agricultural Consultant (2 wheels);Cane - quad          Prior Functioning/Environment Prior Level of Function : Needs assist;Patient poor historian/Family not available             Mobility Comments: no family present, pt not a reliable historian ADLs Comments: pt reports that she has total A with bathing and dressing, toilets herself        OT Problem List: Decreased strength;Impaired balance (sitting and/or standing);Decreased safety awareness;Decreased cognition;Decreased activity tolerance;Decreased knowledge of use of DME or AE;Obesity      OT Treatment/Interventions: Self-care/ADL training;Therapeutic exercise;DME and/or AE instruction;Therapeutic activities;Patient/family education    OT Goals(Current goals can be found in the care plan section) Acute Rehab OT Goals Patient Stated Goal: get back to my house OT Goal Formulation:  With patient Time For Goal Achievement: 05/09/23 Potential to Achieve Goals: Good ADL Goals Pt Will Perform Grooming: with supervision;sitting Pt Will Transfer to Toilet: with mod assist;stand pivot transfer;bedside commode;ambulating;regular height toilet;grab bars Pt Will Perform Toileting - Clothing Manipulation and hygiene: with mod assist;with min assist;sit to/from stand  OT Frequency: Min 1X/week    Co-evaluation              AM-PAC OT 6 Clicks Daily Activity     Outcome Measure Help from another person eating meals?: None Help from another person taking care of personal grooming?: A Little Help from another person toileting, which includes using toliet, bedpan, or urinal?: Total Help from another person bathing (including washing, rinsing, drying)?: Total Help from another person to put on and taking off regular upper body clothing?: Total Help from another person to put on and taking off regular lower body clothing?: Total 6 Click Score: 11   End of Session Equipment Utilized During Treatment: Gait belt;Rolling walker (2 wheels)  Activity Tolerance: Patient tolerated  treatment well Patient left: in chair;with call bell/phone within reach;with chair alarm set  OT Visit Diagnosis: Unsteadiness on feet (R26.81);Other abnormalities of gait and mobility (R26.89);Muscle weakness (generalized) (M62.81)                Time: 8770-8746 OT Time Calculation (min): 24 min Charges:  OT General Charges $OT Visit: 1 Visit OT Evaluation $OT Eval Moderate Complexity: 1 Mod OT Treatments $Therapeutic Activity: 8-22 mins    Jacques Karna Loose 04/25/2023, 2:10 PM

## 2023-04-26 DIAGNOSIS — R0600 Dyspnea, unspecified: Secondary | ICD-10-CM | POA: Diagnosis not present

## 2023-04-26 LAB — BASIC METABOLIC PANEL
Anion gap: 6 (ref 5–15)
BUN: 20 mg/dL (ref 8–23)
CO2: 26 mmol/L (ref 22–32)
Calcium: 9.6 mg/dL (ref 8.9–10.3)
Chloride: 103 mmol/L (ref 98–111)
Creatinine, Ser: 1.24 mg/dL — ABNORMAL HIGH (ref 0.44–1.00)
GFR, Estimated: 43 mL/min — ABNORMAL LOW (ref >60)
Glucose, Bld: 70 mg/dL (ref 70–99)
Potassium: 5.4 mmol/L — ABNORMAL HIGH (ref 3.5–5.1)
Sodium: 135 mmol/L (ref 135–145)

## 2023-04-26 MED ORDER — APIXABAN 5 MG PO TABS
5.0000 mg | ORAL_TABLET | Freq: Two times a day (BID) | ORAL | Status: DC
  Administered 2023-04-26 – 2023-04-28 (×5): 5 mg via ORAL
  Filled 2023-04-26 (×4): qty 1

## 2023-04-26 MED ORDER — BISACODYL 5 MG PO TBEC
10.0000 mg | DELAYED_RELEASE_TABLET | Freq: Two times a day (BID) | ORAL | Status: DC
  Administered 2023-04-26 – 2023-04-28 (×5): 10 mg via ORAL
  Filled 2023-04-26 (×5): qty 2

## 2023-04-26 MED ORDER — SODIUM ZIRCONIUM CYCLOSILICATE 10 G PO PACK
10.0000 g | PACK | Freq: Two times a day (BID) | ORAL | Status: AC
Start: 2023-04-26 — End: 2023-04-26
  Administered 2023-04-26 (×2): 10 g via ORAL
  Filled 2023-04-26 (×2): qty 1

## 2023-04-26 NOTE — Progress Notes (Addendum)
 PROGRESS NOTE    MERRISSA GIACOBBE  FMW:[id number] DOB: 04-Aug-1940 DOA: 04/23/2023 PCP: Albina GORMAN Dine, MD    Brief Narrative:  83 year old female with history of CHF with preserved EF, GERD, HTN, DM2 presents to the ED with complaints of abdominal pain, constipation and suprapubic fullness.  CT abdomen pelvis showed concerns of colitis.  Initially was also hypothermic.  Started on IV Rocephin .  Also concerns of urinary tract infection.   Assessment & Plan:  Principal Problem:   Sepsis (HCC) Active Problems:   Colitis    Sepsis secondary to colitis/urinary tract infection -Sepsis physiology is improving.  Follow culture data.  On empiric IV Rocephin .   Dysphagia, chronic dysmotility -Esophagram showed moderate esophageal dysmotility.  Seen by [facility], no endoscopic evaluation recommended.  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 Speech and swallow-mild aspiration risk.  Otherwise continue regular/thin liquids   Hyperkalemia -Improved.  Will give Lokelma    Uncomplicated acute colitis of descending/sigmoid -Seen on CT abdomen pelvis.  GI panel not ordered.  This could also be steriocolitis in the setting of constipation.  On empiric IV Rocephin  and Flagyl .  Monitor diet as tolerated.  If starts having diarrhea, will pursue further workup.   Acute kidney injury -Admission creatinine 1.68 > 1.24   Hyponatremia, mild -Improving with fluids   Urinary retention -Will discussed with RN regarding removing Foley catheter   Constipation Scheduled bowel regimen   Chronic diastolic CHF, preserved EF -Echocardiogram from 01/10/2023 showed EF of 55 to 60% with grade 2 diastolic dysfunction.  Appears to be constipated   Atrial fibrillation with slow ventricular response -Previous provider discussed with cardiology.  Slow ventricular response rate.  Not on AV nodal blocker.  Eliquis  started  PT  OT-SNF.   DVT prophylaxis: Eliquis     Code Status: Full Code Family Communication:  Daughter updated.  Status is: Inpatient Remains inpatient appropriate because: SNF placement    Subjective: Patient concerned about esophageal dysmotility and frequent poor tolerance to oral fluid.  No further input from GI at this point.  Daughter agrees to use basic precautions at this time. No adv feeding tube.   Examination:  General exam: Appears calm and comfortable  Respiratory system: Clear to auscultation. Respiratory effort normal. Cardiovascular system: S1 & S2 heard, RRR. No JVD, murmurs, rubs, gallops or clicks. No pedal edema. Gastrointestinal system: Abdomen is nondistended, soft and nontender. No organomegaly or masses felt. Normal bowel sounds heard. Central nervous system: Alert and oriented. No focal neurological deficits. Extremities: Symmetric 5 x 5 power. Skin: No rashes, lesions or ulcers Psychiatry: Judgement and insight appear normal. Mood & affect appropriate.        Diet Orders (From admission, onward)     Start     Ordered   04/23/23 1521  Diet regular Room service appropriate? Yes; Fluid consistency: Thin  Diet effective now       Question Answer Comment  Room service appropriate? Yes   Fluid consistency: Thin      04/23/23 1525            Objective: Vitals:   04/25/23 1701 04/25/23 1922 04/26/23 0402 04/26/23 0942  BP:  (!) 156/60 (!) 170/64   Pulse:  65 67   Resp:  18 17   Temp:   (!) 96.9 F (36.1 C)   TempSrc:   Axillary   SpO2: 94% 91% 98% 97%  Weight:  Height:        Intake/Output Summary (Last 24 hours) at 04/26/2023 1042 Last data filed at 04/26/2023 0907 Gross per 24 hour  Intake 195.73 ml  Output 1550 ml  Net -1354.27 ml   Filed Weights   04/23/23 1040  Weight: 126.6 kg    Scheduled Meds:  apixaban   5 mg Oral BID   bisacodyl   10 mg Oral BID   Chlorhexidine  Gluconate Cloth  6 each Topical Daily   donepezil   5 mg Oral  Daily   fluticasone   1 spray Each Nare Daily   fluticasone  furoate-vilanterol  1 puff Inhalation Daily   guaiFENesin   10 mL Oral Q6H   oxybutynin   10 mg Oral Daily   pantoprazole   40 mg Oral Daily   polyethylene glycol  17 g Oral BID   rosuvastatin   5 mg Oral Daily   senna  1 tablet Oral BID   sodium zirconium cyclosilicate   10 g Oral BID   Continuous Infusions:  cefTRIAXone  (ROCEPHIN )  IV 2 g (04/25/23 2203)   metronidazole  500 mg (04/26/23 0958)    Nutritional status     Body mass index is 47.91 kg/m.  Data Reviewed:   CBC: Recent Labs  Lab 04/23/23 1055 04/24/23 0419  WBC 3.1* 3.4*  NEUTROABS 1.9  --   HGB 10.3* 9.9*  HCT 34.0* 32.9*  MCV 98.3 98.8  PLT 112* 112*   Basic Metabolic Panel: Recent Labs  Lab 04/23/23 1055 04/23/23 1149 04/24/23 0419 04/24/23 1940 04/25/23 1118 04/26/23 0702  NA 128*  --  129* 130* 133* 135  K 5.6*  --  6.0* 5.6* 5.8* 5.4*  CL 100  --  102 103 104 103  CO2 24  --  23 20* 22 26  GLUCOSE 85  --  70 70 69* 70  BUN 25*  --  21 23 22 20   CREATININE 1.79*  --  1.68* 1.51* 1.47* 1.24*  CALCIUM  9.2  --  8.6* 8.5* 9.2 9.6  MG  --  2.2  --   --   --   --    GFR: Estimated Creatinine Clearance: 45.3 mL/min (A) (by C-G formula based on SCr of 1.24 mg/dL (H)). Liver Function Tests: Recent Labs  Lab 04/23/23 1055  AST 15  ALT 24  ALKPHOS 74  BILITOT 0.5  PROT 6.9  ALBUMIN 3.4*   No results for input(s): LIPASE, AMYLASE in the last 168 hours. No results for input(s): AMMONIA in the last 168 hours. Coagulation Profile: Recent Labs  Lab 04/23/23 1055  INR 1.0   Cardiac Enzymes: No results for input(s): CKTOTAL, CKMB, CKMBINDEX, TROPONINI in the last 168 hours. BNP (last 3 results) No results for input(s): PROBNP in the last 8760 hours. HbA1C: No results for input(s): HGBA1C in the last 72 hours. CBG: No results for input(s): GLUCAP in the last 168 hours. Lipid Profile: No results for input(s):  CHOL, HDL, LDLCALC, TRIG, CHOLHDL, LDLDIRECT in the last 72 hours. Thyroid  Function Tests: Recent Labs    04/23/23 1149  TSH 6.528*  FREET4 0.90   Anemia Panel: No results for input(s): VITAMINB12, FOLATE, FERRITIN, TIBC, IRON, RETICCTPCT in the last 72 hours. Sepsis Labs: Recent Labs  Lab 04/23/23 1113 04/23/23 1449  LATICACIDVEN 0.7 0.6    Recent Results (from the past 240 hours)  Culture, blood (Routine x 2)     Status: None (Preliminary result)   Collection Time: 04/23/23 10:55 AM   Specimen: BLOOD  Result Value Ref Range  Status   Specimen Description   Final    BLOOD RIGHT BRACHIAL ARTERY Performed at [facility], [address], [place], [place] [address]    Special Requests   Final    BOTTLES DRAWN AEROBIC AND ANAEROBIC Blood Culture adequate volume Performed at [facility], [address] [place], [place] [address]    Culture   Final    NO GROWTH 3 DAYS Performed at [facility], [address] [place], [place] [address]    Report Status PENDING  Incomplete  Resp panel by RT-PCR (RSV, Flu A&B, Covid) Anterior Nasal Swab     Status: None   Collection Time: 04/23/23 11:40 AM   Specimen: Anterior Nasal Swab  Result Value Ref Range Status   SARS Coronavirus 2 by RT PCR NEGATIVE NEGATIVE Final    Comment: (NOTE) SARS-CoV-2 target nucleic acids are NOT DETECTED.  The SARS-CoV-2 RNA is generally detectable in upper respiratory specimens during the acute phase of infection. The lowest concentration of SARS-CoV-2 viral copies this assay can detect is 138 copies/mL. A negative result does not preclude SARS-Cov-2 infection and should not be used as the sole basis for treatment or other patient management decisions. A negative result may occur with  improper specimen collection/handling, submission of specimen other than nasopharyngeal swab, presence of viral mutation(s) within the areas targeted  by this assay, and inadequate number of viral copies(<138 copies/mL). A negative result must be combined with clinical observations, patient history, and epidemiological information. The expected result is Negative.  Fact Sheet for Patients:  bloggercourse.com  Fact Sheet for Healthcare Providers:  seriousbroker.it  This test is no t yet approved or cleared by the [place]  FDA and  has been authorized for detection and/or diagnosis of SARS-CoV-2 by FDA under an Emergency Use Authorization (EUA). This EUA will remain  in effect (meaning this test can be used) for the duration of the COVID-19 declaration under Section 564(b)(1) of the Act, 21 U.S.C.section 360bbb-3(b)(1), unless the authorization is terminated  or revoked sooner.       Influenza A by PCR NEGATIVE NEGATIVE Final   Influenza B by PCR NEGATIVE NEGATIVE Final    Comment: (NOTE) The Xpert Xpress SARS-CoV-2/FLU/RSV plus assay is intended as an aid in the diagnosis of influenza from Nasopharyngeal swab specimens and should not be used as a sole basis for treatment. Nasal washings and aspirates are unacceptable for Xpert Xpress SARS-CoV-2/FLU/RSV testing.  Fact Sheet for Patients: bloggercourse.com  Fact Sheet for Healthcare Providers: seriousbroker.it  This test is not yet approved or cleared by the [place]  FDA and has been authorized for detection and/or diagnosis of SARS-CoV-2 by FDA under an Emergency Use Authorization (EUA). This EUA will remain in effect (meaning this test can be used) for the duration of the COVID-19 declaration under Section 564(b)(1) of the Act, 21 U.S.C. section 360bbb-3(b)(1), unless the authorization is terminated or revoked.     Resp Syncytial Virus by PCR NEGATIVE NEGATIVE Final    Comment: (NOTE) Fact Sheet for Patients: bloggercourse.com  Fact  Sheet for Healthcare Providers: seriousbroker.it  This test is not yet approved or cleared by the [place]  FDA and has been authorized for detection and/or diagnosis of SARS-CoV-2 by FDA under an Emergency Use Authorization (EUA). This EUA will remain in effect (meaning this test can be used) for the duration of the COVID-19 declaration under Section 564(b)(1) of the Act, 21 U.S.C. section 360bbb-3(b)(1), unless the authorization is terminated or  revoked.  Performed at [facility], [address] [place], [place] [address]   Culture, blood (Routine x 2)     Status: None (Preliminary result)   Collection Time: 04/23/23 11:49 AM   Specimen: BLOOD  Result Value Ref Range Status   Specimen Description   Final    BLOOD RIGHT ANTECUBITAL Performed at [facility], [address], [place], [place] [address]    Special Requests   Final    BOTTLES DRAWN AEROBIC AND ANAEROBIC Blood Culture results may not be optimal due to an inadequate volume of blood received in culture bottles Performed at [facility], [address], [place], [place] [address]    Culture   Final    NO GROWTH 3 DAYS Performed at [facility], [address] [place], [place] [address]    Report Status PENDING  Incomplete  Urine Culture     Status: None   Collection Time: 04/23/23  1:53 PM   Specimen: Urine, Catheterized  Result Value Ref Range Status   Specimen Description   Final    URINE, CATHETERIZED Performed at [facility], [address], [place], [place] [address]    Special Requests   Final    NONE Performed at [facility], [address] [place], [place] [address]    Culture   Final    NO GROWTH Performed at [facility], [address], [place], [place] [address]    Report Status 04/24/2023 FINAL  Final  MRSA Next Gen by PCR, Nasal     Status: None    Collection Time: 04/24/23  4:19 AM   Specimen: Nasal Mucosa; Nasal Swab  Result Value Ref Range Status   MRSA by PCR Next Gen NOT DETECTED NOT DETECTED Final    Comment: (NOTE) The GeneXpert MRSA Assay (FDA approved for NASAL specimens only), is one component of a comprehensive MRSA colonization surveillance program. It is not intended to diagnose MRSA infection nor to guide or monitor treatment for MRSA infections. Test performance is not FDA approved in patients less than 69 years old. Performed at [facility], [address], [place], [place] [address]          Radiology Studies: No results found.          LOS: 3 days   Time spent= 35 mins    Burgess JAYSON Dare, MD [facility]  If 7PM-7AM, please contact night-coverage  04/26/2023, 10:42 AM

## 2023-04-26 NOTE — TOC Initial Note (Addendum)
 Transition of Care [facility]) - Initial/Assessment Note    Patient Details  Name: Elizabeth [name] MRN: [id number] Date of Birth: November 06, 1940  Transition of Care [facility]) CM/SW Contact:    Sonda Manuella Quill, RN Phone Number: 04/26/2023, 11:51 AM  Clinical Narrative:                 TOC for d/c planning; pt disoriented; spoke w/ her dtr Khloie Hamada [phone]); she says pt is from [facility]; she does not want the pt to return at d/c; she verified PCP/Insurance; she denies pt experiencing SDOH risks; she says pt needs ambulance for transport; Ms Baum says pt has rolator; she does not have home oxygen  or HH services; her dtr says pt has glasses, upper partial, and bil HAs; TOC will follow; also spoke w/ Ezzie, RN at facility; he verified pt's residency, and says she SNF level of care; TOC will follow.    Expected Discharge Plan: Skilled Nursing Facility Barriers to Discharge: Continued Medical Work up   Patient Goals and CMS Choice Patient states their goals for this hospitalization and ongoing recovery are:: pt's daughter Marji Kuehnel says she will d/c to SNF CMS Medicare.gov Compare Post Acute Care list provided to:: Patient Represenative (must comment) ([name] Roan (dtr))   [place] ownership interest in [facility].provided to:: Adult Children    Expected Discharge Plan and Services   Discharge Planning Services: CM Consult Post Acute Care Choice: Skilled Nursing Facility Living arrangements for the past 2 months: Assisted Living Facility [facility])                                      Prior Living Arrangements/Services Living arrangements for the past 2 months: Assisted Living Facility [facility]) Lives with:: Facility Resident Patient language and need for interpreter reviewed:: Yes Do you feel safe going back to the place where you live?: Yes      Need for Family Participation in Patient Care: Yes (Comment) Care giver support  system in place?: Yes (comment) Current home services: DME ([employer]) Criminal Activity/Legal Involvement Pertinent to Current Situation/Hospitalization: No - Comment as needed  Activities of Daily Living   ADL Screening (condition at time of admission) Independently performs ADLs?: No Does the patient have a NEW difficulty with bathing/dressing/toileting/self-feeding that is expected to last >3 days?: Yes (Initiates electronic notice to provider for possible OT consult) Does the patient have a NEW difficulty with getting in/out of bed, walking, or climbing stairs that is expected to last >3 days?: Yes (Initiates electronic notice to provider for possible PT consult) Does the patient have a NEW difficulty with communication that is expected to last >3 days?: Yes (Initiates electronic notice to provider for possible SLP consult) Is the patient deaf or have difficulty hearing?: No Does the patient have difficulty seeing, even when wearing glasses/contacts?: No Does the patient have difficulty concentrating, remembering, or making decisions?: Yes  Permission Sought/Granted Permission sought to share information with : Case Manager Permission granted to share information with : Yes, Verbal Permission Granted  Share Information with NAME: Case Manager     Permission granted to share info w Relationship: [name] Knowlton (dtr) [phone]     Emotional Assessment Appearance:: Other (Comment Required (unable to assess) Attitude/Demeanor/Rapport: Unable to Assess Affect (typically observed): Unable to Assess Orientation: :  (unable to assess) Alcohol  / Substance Use: Not Applicable Psych Involvement: No (comment)  Admission diagnosis:  Colitis [K52.9] Acute cystitis without hematuria [N30.00] Acute kidney injury (HCC) [N17.9] Sepsis (HCC) [A41.9] Atrial fibrillation, unspecified type (HCC) [I48.91] Sepsis with acute renal failure without septic shock, due to unspecified organism,  unspecified acute renal failure type (HCC) [A41.9, R65.20, N17.9] Patient Active Problem List   Diagnosis Date Noted   Colitis 04/24/2023   Sepsis (HCC) 04/23/2023   (HFpEF) heart failure with preserved ejection fraction (HCC) 12/24/2022   Morbid obesity (HCC) 12/24/2022   OSA (obstructive sleep apnea) 03/28/2022   Diabetes mellitus without complication (HCC) 06/19/2021   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 [place] [address] Phone: [phone] Fax: [phone]     Social Drivers of Health (SDOH) Social History: SDOH Screenings   Food Insecurity: No Food Insecurity (04/26/2023)  Housing: Low Risk  (04/26/2023)  Transportation Needs: No Transportation Needs (04/26/2023)  Utilities: Not At Risk (04/26/2023)  Social Connections: Unknown (04/24/2023)  Tobacco Use: Medium Risk (04/23/2023)   SDOH Interventions: Food Insecurity Interventions: Intervention Not Indicated, Inpatient TOC Housing Interventions: Intervention Not Indicated, Inpatient TOC Transportation Interventions: Intervention Not Indicated, Inpatient TOC Utilities Interventions: Intervention Not Indicated, Inpatient TOC   Readmission Risk Interventions     No data to display

## 2023-04-26 NOTE — Hospital Course (Addendum)
 Brief Narrative:  83 year old female with history of CHF with preserved EF, GERD, HTN, DM2 presents to the ED with complaints of abdominal pain, constipation and suprapubic fullness.  CT abdomen pelvis showed concerns of colitis.  Initially was also hypothermic.  Empirically patient was started on Rocephin , cultures remain negative. 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  Was also seen by [facility] gastroenterology and no further workup including endoscopy was recommended.  Recommended outpatient follow-up.  Speech and swallow recommended mild aspiration precaution otherwise can continue regular/thin liquids. PT/OT-recommending SNF.  TOC consulted  Assessment & Plan:  Principal Problem:   Sepsis (HCC) Active Problems:   Colitis    Sepsis secondary to colitis/urinary tract infection -Sepsis physiology is improving.  Follow culture data.  On empiric IV Rocephin  (EOT 1/7 due to colitis). Augmentin  upon dc for 2 more days.    Dysphagia, chronic dysmotility -Esophagram showed moderate esophageal dysmotility.  Seen by [facility], no endoscopic evaluation recommended.  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 Speech and swallow-mild aspiration risk.  Otherwise continue regular/thin liquids. -Discussed with patient's daughter as well that she is not a good candidate for any sort of advance feeding tube.   Hyperkalemia -Improved.  Received Lokelma .    Uncomplicated acute colitis of descending/sigmoid -Seen on CT abdomen pelvis.  GI panel not ordered.  This could also be steriocolitis in the setting of constipation.  No diarrhea during this admission.  Will complete total 7-day course, EOT 1/7   Acute kidney injury -Admission creatinine 1.68 . Now around baseline 1.0 Stopped hydrochlorothiazide . Ok to resume losartan .   Hypomagnesemia - As needed repletion    Hyponatremia, mild -Improving with fluids   Urinary retention Dc foley today. Continue to monitor for retention.    Constipation Scheduled bowel regimen  Diabetes mellitus type 2 - Continue home metformin and Jardiance .  Adjust as necessary.  Accu-Cheks 3 times a day and before bedtime.   Chronic diastolic CHF, preserved EF -Echocardiogram from 01/10/2023 showed EF of 55 to 60% with grade 2 diastolic dysfunction.  Continue home medications including losartan , Jardiance .  To monitor for any signs of dehydration especially patient is on Jardiance .   Atrial fibrillation with slow ventricular response -Previous provider discussed with cardiology.  Slow ventricular response rate.  Not on AV nodal blocker.  Eliquis  started BID.   Hydrochlorothiazide  stopped for now.    PT OT-SNF.   DVT prophylaxis: Eliquis     Code Status: Full Code Family Communication:  Daughter updated on the day of DC by me.  Status is: Inpatient Remains inpatient appropriate because: SNF placement  Subjective: Able to tolerate her breakfast this morning.  No other complaints.  Examination:  General exam: Appears calm and comfortable  Respiratory system: Clear to auscultation. Respiratory effort normal. Cardiovascular system: S1 & S2 heard, RRR. No JVD, murmurs, rubs, gallops or clicks.  2+ bilateral lower extremity pitting edema, chronic Gastrointestinal system: Abdomen is nondistended, soft and nontender. No organomegaly or masses felt. Normal bowel sounds heard. Central nervous system: Alert and oriented. No focal neurological deficits. Extremities: Symmetric 5 x 5 power. Skin: No rashes, lesions or ulcers Psychiatry: Judgement and insight appear normal. Mood & affect appropriate. Foley in place

## 2023-04-26 NOTE — Plan of Care (Signed)
  Problem: Coping: Goal: Level of anxiety will decrease Outcome: Progressing   Problem: Elimination: Goal: Will not experience complications related to bowel motility Outcome: Progressing   Problem: Pain Management: Goal: General experience of comfort will improve Outcome: Progressing   Problem: Safety: Goal: Ability to remain free from injury will improve Outcome: Progressing   Problem: Skin Integrity: Goal: Risk for impaired skin integrity will decrease Outcome: Progressing

## 2023-04-26 NOTE — Plan of Care (Signed)

## 2023-04-26 NOTE — Progress Notes (Signed)
 PHARMACY - ANTICOAGULATION CONSULT NOTE  Pharmacy Consult for apixaban  Indication: atrial fibrillation  No Known Allergies  Patient Measurements: Height: 5' 4 (162.6 cm) Weight: 126.6 kg (279 lb 1.6 oz) IBW/kg (Calculated) : 54.7  Vital Signs: Temp: 96.9 F (36.1 C) (01/04 0402) Temp Source: Axillary (01/04 0402) BP: 170/64 (01/04 0402) Pulse Rate: 67 (01/04 0402)  Labs: Recent Labs    04/23/23 1055 04/23/23 1149 04/24/23 0419 04/24/23 1940 04/25/23 1118 04/26/23 0702  HGB 10.3*  --  9.9*  --   --   --   HCT 34.0*  --  32.9*  --   --   --   PLT 112*  --  112*  --   --   --   LABPROT 13.5  --   --   --   --   --   INR 1.0  --   --   --   --   --   CREATININE 1.79*  --  1.68* 1.51* 1.47* 1.24*  TROPONINIHS  --  11  --   --   --   --     Estimated Creatinine Clearance: 45.3 mL/min (A) (by C-G formula based on SCr of 1.24 mg/dL (H)).   Medical History: Past Medical History:  Diagnosis Date   (HFpEF) heart failure with preserved ejection fraction (HCC) 12/24/2022   Arthritis    Asthma    Breast mass in female    Bronchitis    Bronchitis    Constipation    Cough    GERD (gastroesophageal reflux disease)    Hypertension associated with diabetes (HCC)    Type 2 diabetes mellitus (HCC)    Wheezing      Assessment: 83 yo F admitted with abdominal pain, constipation and suprapubic fullness. Pharmacy consulted to dose apixaban  for Afib.   Age > 80 Wt 126.6 kg SCr improving (SCr < 1 in November)  Goal of Therapy:  Monitor platelets by anticoagulation protocol: Yes   Plan:  -Patient no longer meets 2/3 criteria for reduced dose of Eliquis  with improvement in SCr -Increase Eliquis  to 5 mg BID -Will educate and provide 30 day free card prior to discharge   Stefano MARLA Bologna, PharmD, BCPS Clinical Pharmacist 04/26/2023 8:54 AM

## 2023-04-27 DIAGNOSIS — N179 Acute kidney failure, unspecified: Secondary | ICD-10-CM

## 2023-04-27 LAB — BASIC METABOLIC PANEL
Anion gap: 9 (ref 5–15)
BUN: 15 mg/dL (ref 8–23)
CO2: 24 mmol/L (ref 22–32)
Calcium: 9.7 mg/dL (ref 8.9–10.3)
Chloride: 105 mmol/L (ref 98–111)
Creatinine, Ser: 0.92 mg/dL (ref 0.44–1.00)
GFR, Estimated: >60 mL/min (ref >60)
Glucose, Bld: 93 mg/dL (ref 70–99)
Potassium: 5 mmol/L (ref 3.5–5.1)
Sodium: 138 mmol/L (ref 135–145)

## 2023-04-27 MED ORDER — METOPROLOL TARTRATE 5 MG/5ML IV SOLN: 5.0000 mg | INTRAVENOUS | Status: DC | PRN

## 2023-04-27 MED ORDER — IPRATROPIUM-ALBUTEROL 0.5-2.5 (3) MG/3ML IN SOLN: 3.0000 mL | RESPIRATORY_TRACT | Status: DC | PRN

## 2023-04-27 MED ORDER — SENNOSIDES-DOCUSATE SODIUM 8.6-50 MG PO TABS: 1.0000 | ORAL_TABLET | Freq: Every evening | ORAL | Status: DC | PRN

## 2023-04-27 NOTE — Plan of Care (Signed)

## 2023-04-27 NOTE — Plan of Care (Signed)

## 2023-04-27 NOTE — Progress Notes (Signed)
 PROGRESS NOTE    Elizabeth [name]  FMW:[id number] DOB: [birth year] DOA: 04/23/2023 PCP: Albina GORMAN Dine, MD    Brief Narrative:  83 year old female with history of CHF with preserved EF, GERD, HTN, DM2 presents to the ED with complaints of abdominal pain, constipation and suprapubic fullness.  CT abdomen pelvis showed concerns of colitis.  Initially was also hypothermic.  Empirically patient was started on Rocephin , cultures remain negative. 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  Was also seen by [facility] gastroenterology and no further workup including endoscopy was recommended.  Recommended outpatient follow-up.  Speech and swallow recommended mild aspiration precaution otherwise can continue regular/thin liquids.   Assessment & Plan:  Principal Problem:   Sepsis (HCC) Active Problems:   Colitis    Sepsis secondary to colitis/urinary tract infection -Sepsis physiology is improving.  Follow culture data.  On empiric IV Rocephin  (EOT 1/7 due to colitis).   Dysphagia, chronic dysmotility -Esophagram showed moderate esophageal dysmotility.  Seen by [facility], no endoscopic evaluation recommended.  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 Speech and swallow-mild aspiration risk.  Otherwise continue regular/thin liquids. -Discussed with patient's daughter as well that she is not a good candidate for any sort of advance feeding tube.   Hyperkalemia -Improved.  Received Lokelma .    Uncomplicated acute colitis of descending/sigmoid -Seen on CT abdomen pelvis.  GI panel not ordered.  This could also be steriocolitis in the setting of constipation.  No diarrhea during this admission.  Will complete total 7-day course, EOT 1/7   Acute kidney injury -Admission creatinine 1.68 > 1.24 > 0.92   Hyponatremia, mild -Improving with fluids   Urinary  retention -Will discussed with RN regarding removing Foley catheter   Constipation Scheduled bowel regimen   Chronic diastolic CHF, preserved EF -Echocardiogram from 01/10/2023 showed EF of 55 to 60% with grade 2 diastolic dysfunction.  Appears to be constipated   Atrial fibrillation with slow ventricular response -Previous provider discussed with cardiology.  Slow ventricular response rate.  Not on AV nodal blocker.  Eliquis  started  PT OT-SNF.   DVT prophylaxis: Eliquis     Code Status: Full Code Family Communication:  Daughter updated.  Status is: Inpatient Remains inpatient appropriate because: SNF placement  Subjective: Able to tolerate her breakfast this morning.  No other complaints.  Examination:  General exam: Appears calm and comfortable  Respiratory system: Clear to auscultation. Respiratory effort normal. Cardiovascular system: S1 & S2 heard, RRR. No JVD, murmurs, rubs, gallops or clicks.  2+ bilateral lower extremity pitting edema Gastrointestinal system: Abdomen is nondistended, soft and nontender. No organomegaly or masses felt. Normal bowel sounds heard. Central nervous system: Alert and oriented. No focal neurological deficits. Extremities: Symmetric 5 x 5 power. Skin: No rashes, lesions or ulcers Psychiatry: Judgement and insight appear normal. Mood & affect appropriate. Foley in place               Diet Orders (From admission, onward)     Start     Ordered   04/23/23 1521  Diet regular Room service appropriate? Yes; Fluid consistency: Thin  Diet effective now       Question Answer Comment  Room service appropriate? Yes   Fluid consistency: Thin      04/23/23 1525            Objective: Vitals:   04/26/23 1326 04/26/23 2217 04/27/23 9566  04/27/23 0838  BP: (!) 143/61 (!) 150/65 (!) 155/68   Pulse: 68 83 86   Resp: 16 18 18    Temp: (!) 97.5 F (36.4 C) 97.7 F (36.5 C) 98.9 F (37.2 C)   TempSrc: Oral     SpO2: 98% 94% 99% 95%   Weight:      Height:        Intake/Output Summary (Last 24 hours) at 04/27/2023 1042 Last data filed at 04/27/2023 0433 Gross per 24 hour  Intake 458.31 ml  Output 2100 ml  Net -1641.69 ml   Filed Weights   04/23/23 1040  Weight: 126.6 kg    Scheduled Meds:  apixaban   5 mg Oral BID   bisacodyl   10 mg Oral BID   Chlorhexidine  Gluconate Cloth  6 each Topical Daily   donepezil   5 mg Oral Daily   fluticasone   1 spray Each Nare Daily   fluticasone  furoate-vilanterol  1 puff Inhalation Daily   guaiFENesin   10 mL Oral Q6H   oxybutynin   10 mg Oral Daily   pantoprazole   40 mg Oral Daily   polyethylene glycol  17 g Oral BID   rosuvastatin   5 mg Oral Daily   senna  1 tablet Oral BID   Continuous Infusions:  cefTRIAXone  (ROCEPHIN )  IV 2 g (04/26/23 2303)   metronidazole  500 mg (04/27/23 0813)    Nutritional status     Body mass index is 47.91 kg/m.  Data Reviewed:   CBC: Recent Labs  Lab 04/23/23 1055 04/24/23 0419  WBC 3.1* 3.4*  NEUTROABS 1.9  --   HGB 10.3* 9.9*  HCT 34.0* 32.9*  MCV 98.3 98.8  PLT 112* 112*   Basic Metabolic Panel: Recent Labs  Lab 04/23/23 1149 04/24/23 0419 04/24/23 1940 04/25/23 1118 04/26/23 0702 04/27/23 0941  NA  --  129* 130* 133* 135 138  K  --  6.0* 5.6* 5.8* 5.4* 5.0  CL  --  102 103 104 103 105  CO2  --  23 20* 22 26 24   GLUCOSE  --  70 70 69* 70 93  BUN  --  21 23 22 20 15   CREATININE  --  1.68* 1.51* 1.47* 1.24* 0.92  CALCIUM   --  8.6* 8.5* 9.2 9.6 9.7  MG 2.2  --   --   --   --   --    GFR: Estimated Creatinine Clearance: 61.1 mL/min (by C-G formula based on SCr of 0.92 mg/dL). Liver Function Tests: Recent Labs  Lab 04/23/23 1055  AST 15  ALT 24  ALKPHOS 74  BILITOT 0.5  PROT 6.9  ALBUMIN 3.4*   No results for input(s): LIPASE, AMYLASE in the last 168 hours. No results for input(s): AMMONIA in the last 168 hours. Coagulation Profile: Recent Labs  Lab 04/23/23 1055  INR 1.0   Cardiac Enzymes: No  results for input(s): CKTOTAL, CKMB, CKMBINDEX, TROPONINI in the last 168 hours. BNP (last 3 results) No results for input(s): PROBNP in the last 8760 hours. HbA1C: No results for input(s): HGBA1C in the last 72 hours. CBG: No results for input(s): GLUCAP in the last 168 hours. Lipid Profile: No results for input(s): CHOL, HDL, LDLCALC, TRIG, CHOLHDL, LDLDIRECT in the last 72 hours. Thyroid  Function Tests: No results for input(s): TSH, T4TOTAL, FREET4, T3FREE, THYROIDAB in the last 72 hours. Anemia Panel: No results for input(s): VITAMINB12, FOLATE, FERRITIN, TIBC, IRON, RETICCTPCT in the last 72 hours. Sepsis Labs: Recent Labs  Lab 04/23/23 1113 04/23/23  1449  LATICACIDVEN 0.7 0.6    Recent Results (from the past 240 hours)  Culture, blood (Routine x 2)     Status: None (Preliminary result)   Collection Time: 04/23/23 10:55 AM   Specimen: BLOOD  Result Value Ref Range Status   Specimen Description   Final    BLOOD RIGHT BRACHIAL ARTERY Performed at [facility], [address], [place], [place] [address]    Special Requests   Final    BOTTLES DRAWN AEROBIC AND ANAEROBIC Blood Culture adequate volume Performed at [facility], [address], [place], [place] [address]    Culture   Final    NO GROWTH 4 DAYS Performed at [facility], [address] [place], [place] [address]    Report Status PENDING  Incomplete  Resp panel by RT-PCR (RSV, Flu A&B, Covid) Anterior Nasal Swab     Status: None   Collection Time: 04/23/23 11:40 AM   Specimen: Anterior Nasal Swab  Result Value Ref Range Status   SARS Coronavirus 2 by RT PCR NEGATIVE NEGATIVE Final    Comment: (NOTE) SARS-CoV-2 target nucleic acids are NOT DETECTED.  The SARS-CoV-2 RNA is generally detectable in upper respiratory specimens during the acute phase of infection. The lowest concentration of SARS-CoV-2 viral copies this  assay can detect is 138 copies/mL. A negative result does not preclude SARS-Cov-2 infection and should not be used as the sole basis for treatment or other patient management decisions. A negative result may occur with  improper specimen collection/handling, submission of specimen other than nasopharyngeal swab, presence of viral mutation(s) within the areas targeted by this assay, and inadequate number of viral copies(<138 copies/mL). A negative result must be combined with clinical observations, patient history, and epidemiological information. The expected result is Negative.  Fact Sheet for Patients:  bloggercourse.com  Fact Sheet for Healthcare Providers:  seriousbroker.it  This test is no t yet approved or cleared by the [place]  FDA and  has been authorized for detection and/or diagnosis of SARS-CoV-2 by FDA under an Emergency Use Authorization (EUA). This EUA will remain  in effect (meaning this test can be used) for the duration of the COVID-19 declaration under Section 564(b)(1) of the Act, 21 U.S.C.section 360bbb-3(b)(1), unless the authorization is terminated  or revoked sooner.       Influenza A by PCR NEGATIVE NEGATIVE Final   Influenza B by PCR NEGATIVE NEGATIVE Final    Comment: (NOTE) The Xpert Xpress SARS-CoV-2/FLU/RSV plus assay is intended as an aid in the diagnosis of influenza from Nasopharyngeal swab specimens and should not be used as a sole basis for treatment. Nasal washings and aspirates are unacceptable for Xpert Xpress SARS-CoV-2/FLU/RSV testing.  Fact Sheet for Patients: bloggercourse.com  Fact Sheet for Healthcare Providers: seriousbroker.it  This test is not yet approved or cleared by the [place]  FDA and has been authorized for detection and/or diagnosis of SARS-CoV-2 by FDA under an Emergency Use Authorization (EUA). This EUA will  remain in effect (meaning this test can be used) for the duration of the COVID-19 declaration under Section 564(b)(1) of the Act, 21 U.S.C. section 360bbb-3(b)(1), unless the authorization is terminated or revoked.     Resp Syncytial Virus by PCR NEGATIVE NEGATIVE Final    Comment: (NOTE) Fact Sheet for Patients: bloggercourse.com  Fact Sheet for Healthcare Providers: seriousbroker.it  This test is not yet approved or cleared by the [place]  FDA and has been authorized for detection and/or diagnosis  of SARS-CoV-2 by FDA under an Emergency Use Authorization (EUA). This EUA will remain in effect (meaning this test can be used) for the duration of the COVID-19 declaration under Section 564(b)(1) of the Act, 21 U.S.C. section 360bbb-3(b)(1), unless the authorization is terminated or revoked.  Performed at [facility], [address], [place], [place] [address]   Culture, blood (Routine x 2)     Status: None (Preliminary result)   Collection Time: 04/23/23 11:49 AM   Specimen: BLOOD  Result Value Ref Range Status   Specimen Description   Final    BLOOD RIGHT ANTECUBITAL Performed at [facility], [address], [place], [place] [address]    Special Requests   Final    BOTTLES DRAWN AEROBIC AND ANAEROBIC Blood Culture results may not be optimal due to an inadequate volume of blood received in culture bottles Performed at [facility], [address], [place], [place] [address]    Culture   Final    NO GROWTH 4 DAYS Performed at [facility], [address], [place], [place] [address]    Report Status PENDING  Incomplete  Urine Culture     Status: None   Collection Time: 04/23/23  1:53 PM   Specimen: Urine, Catheterized  Result Value Ref Range Status   Specimen Description   Final    URINE, CATHETERIZED Performed at [facility], [address], [place], [place] [address]    Special Requests   Final    NONE Performed at [facility], [address], [place], [place] [address]    Culture   Final    NO GROWTH Performed at [facility], [address], [place], [place] [address]    Report Status 04/24/2023 FINAL  Final  MRSA Next Gen by PCR, Nasal     Status: None   Collection Time: 04/24/23  4:19 AM   Specimen: Nasal Mucosa; Nasal Swab  Result Value Ref Range Status   MRSA by PCR Next Gen NOT DETECTED NOT DETECTED Final    Comment: (NOTE) The GeneXpert MRSA Assay (FDA approved for NASAL specimens only), is one component of a comprehensive MRSA colonization surveillance program. It is not intended to diagnose MRSA infection nor to guide or monitor treatment for MRSA infections. Test performance is not FDA approved in patients less than 36 years old. Performed at [facility] [address], [place], [place] [address]          Radiology Studies: No results found.         LOS: 4 days   Time spent= 35 mins    Burgess JAYSON Dare, MD [facility]  If 7PM-7AM, please contact night-coverage  04/27/2023, 10:42 AM

## 2023-04-28 DIAGNOSIS — N179 Acute kidney failure, unspecified: Secondary | ICD-10-CM | POA: Diagnosis not present

## 2023-04-28 LAB — BASIC METABOLIC PANEL
Anion gap: 5 (ref 5–15)
BUN: 16 mg/dL (ref 8–23)
CO2: 29 mmol/L (ref 22–32)
Calcium: 9.3 mg/dL (ref 8.9–10.3)
Chloride: 104 mmol/L (ref 98–111)
Creatinine, Ser: 1.03 mg/dL — ABNORMAL HIGH (ref 0.44–1.00)
GFR, Estimated: 54 mL/min — ABNORMAL LOW (ref >60)
Glucose, Bld: 82 mg/dL (ref 70–99)
Potassium: 4.9 mmol/L (ref 3.5–5.1)
Sodium: 138 mmol/L (ref 135–145)

## 2023-04-28 LAB — CBC
HCT: 29.8 % — ABNORMAL LOW (ref 36.0–46.0)
Hemoglobin: 9.6 g/dL — ABNORMAL LOW (ref 12.0–15.0)
MCH: 30.7 pg (ref 26.0–34.0)
MCHC: 32.2 g/dL (ref 30.0–36.0)
MCV: 95.2 fL (ref 80.0–100.0)
Platelets: 125 10*3/uL — ABNORMAL LOW (ref 150–400)
RBC: 3.13 MIL/uL — ABNORMAL LOW (ref 3.87–5.11)
RDW: 16 % — ABNORMAL HIGH (ref 11.5–15.5)
WBC: 5.4 10*3/uL (ref 4.0–10.5)
nRBC: 0 % (ref 0.0–0.2)

## 2023-04-28 LAB — CULTURE, BLOOD (ROUTINE X 2)
Culture: NO GROWTH
Culture: NO GROWTH
Special Requests: ADEQUATE

## 2023-04-28 LAB — MAGNESIUM: Magnesium: 1.6 mg/dL — ABNORMAL LOW (ref 1.7–2.4)

## 2023-04-28 MED ORDER — AMOXICILLIN-POT CLAVULANATE 875-125 MG PO TABS: 1.0000 | ORAL_TABLET | Freq: Two times a day (BID) | ORAL | 0 refills | Status: AC

## 2023-04-28 MED ORDER — AMOXICILLIN-POT CLAVULANATE 875-125 MG PO TABS
1.0000 | ORAL_TABLET | Freq: Two times a day (BID) | ORAL | Status: DC
Start: 2023-04-28 — End: 2023-04-28
  Administered 2023-04-28: 1 via ORAL
  Filled 2023-04-28: qty 1

## 2023-04-28 MED ORDER — BISACODYL 5 MG PO TBEC: 10.0000 mg | DELAYED_RELEASE_TABLET | Freq: Every day | ORAL | Status: AC | PRN

## 2023-04-28 MED ORDER — OXYCODONE HCL 5 MG PO TABS: 5.0000 mg | ORAL_TABLET | Freq: Four times a day (QID) | ORAL | 0 refills | Status: DC | PRN

## 2023-04-28 MED ORDER — MAGNESIUM SULFATE 2 GM/50ML IV SOLN
2.0000 g | Freq: Once | INTRAVENOUS | Status: AC
  Administered 2023-04-28: 2 g via INTRAVENOUS
  Filled 2023-04-28: qty 50

## 2023-04-28 MED ORDER — APIXABAN 5 MG PO TABS: 5.0000 mg | ORAL_TABLET | Freq: Two times a day (BID) | ORAL | Status: AC

## 2023-04-28 NOTE — Discharge Summary (Signed)
 Physician Discharge Summary  Elizabeth [name] FMW:[id number] DOB: 1941-01-25 DOA: 04/23/2023  PCP: Albina GORMAN Dine, MD  Admit date: 04/23/2023 Discharge date: 04/28/2023  Admitted From: SNF Disposition: SNF  Recommendations for Outpatient Follow-up:  Follow up with PCP in 1-2 weeks Please obtain BMP/CBC in one week your next doctors visit.  Discontinue HCTZ.  Further adjust as necessary 2 more days of oral Augmentin  Eliquis  twice daily started Pain medications with bowel regimen   Discharge Condition: Stable CODE STATUS: Full code Diet recommendation: Heart healthy/diabetic  Brief/Interim Summary: Brief Narrative:  83 year old female with history of CHF with preserved EF, GERD, HTN, DM2 presents to the ED with complaints of abdominal pain, constipation and suprapubic fullness.  CT abdomen pelvis showed concerns of colitis.  Initially was also hypothermic.  Empirically patient was started on Rocephin , cultures remain negative. 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  Was also seen by [facility] gastroenterology and no further workup including endoscopy was recommended.  Recommended outpatient follow-up.  Speech and swallow recommended mild aspiration precaution otherwise can continue regular/thin liquids. PT/OT-recommending SNF.  TOC consulted  Assessment & Plan:  Principal Problem:   Sepsis (HCC) Active Problems:   Colitis    Sepsis secondary to colitis/urinary tract infection -Sepsis physiology is improving.  Follow culture data.  On empiric IV Rocephin  (EOT 1/7 due to colitis). Augmentin  upon dc for 2 more days.    Dysphagia, chronic dysmotility -Esophagram showed moderate esophageal dysmotility.  Seen by [facility], no endoscopic evaluation recommended.  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 Speech and swallow-mild aspiration risk.  Otherwise  continue regular/thin liquids. -Discussed with patient's daughter as well that she is not a good candidate for any sort of advance feeding tube.   Hyperkalemia -Improved.  Received Lokelma .    Uncomplicated acute colitis of descending/sigmoid -Seen on CT abdomen pelvis.  GI panel not ordered.  This could also be steriocolitis in the setting of constipation.  No diarrhea during this admission.  Will complete total 7-day course, EOT 1/7   Acute kidney injury -Admission creatinine 1.68 . Now around baseline 1.0 Stopped hydrochlorothiazide . Ok to resume losartan .   Hypomagnesemia - As needed repletion   Hyponatremia, mild -Improving with fluids   Urinary retention Dc foley today. Continue to monitor for retention.    Constipation Scheduled bowel regimen  Diabetes mellitus type 2 - Continue home metformin and Jardiance .  Adjust as necessary.  Accu-Cheks 3 times a day and before bedtime.   Chronic diastolic CHF, preserved EF -Echocardiogram from 01/10/2023 showed EF of 55 to 60% with grade 2 diastolic dysfunction.  Continue home medications including losartan , Jardiance .  To monitor for any signs of dehydration especially patient is on Jardiance .   Atrial fibrillation with slow ventricular response -Previous provider discussed with cardiology.  Slow ventricular response rate.  Not on AV nodal blocker.  Eliquis  started BID.   Hydrochlorothiazide  stopped for now.    PT OT-SNF.   DVT prophylaxis: Eliquis     Code Status: Full Code Family Communication:  Daughter updated on the day of DC by me.  Status is: Inpatient Remains inpatient appropriate because: SNF placement  Subjective: Able to tolerate her breakfast this morning.  No other complaints.  Examination:  General exam: Appears calm and comfortable  Respiratory system: Clear to auscultation. Respiratory effort normal. Cardiovascular system: S1 & S2 heard, RRR. No JVD, murmurs, rubs, gallops or clicks.  2+ bilateral lower  extremity pitting edema, chronic Gastrointestinal system: Abdomen is nondistended, soft and nontender. No organomegaly or masses felt. Normal bowel sounds heard. Central nervous system: Alert and oriented. No focal neurological deficits. Extremities: Symmetric 5 x 5 power. Skin: No rashes, lesions or ulcers Psychiatry: Judgement and insight appear normal. Mood & affect appropriate. Foley in place   Discharge Diagnoses:  Principal Problem:   Sepsis [facility]) Active Problems:   Colitis      Discharge Exam: Vitals:   04/28/23 0407 04/28/23 0804  BP: (!) 167/62   Pulse: 71   Resp: 18   Temp: 98.5 F (36.9 C)   SpO2: 100% 100%   Vitals:   04/27/23 1229 04/27/23 2002 04/28/23 0407 04/28/23 0804  BP: 116/63 (!) 188/73 (!) 167/62   Pulse: 78 75 71   Resp:  18 18   Temp: 98.7 F (37.1 C) 97.8 F (36.6 C) 98.5 F (36.9 C)   TempSrc: Oral Oral    SpO2: 99% 100% 100% 100%  Weight:      Height:          Discharge Instructions   Allergies as of 04/28/2023   No Known Allergies      Medication List     STOP taking these medications    hydrochlorothiazide  25 MG tablet Commonly known as: HYDRODIURIL    levofloxacin 500 MG tablet Commonly known as: LEVAQUIN   nitroGLYCERIN 0.4 MG SL tablet Commonly known as: NITROSTAT       TAKE these medications    acetaminophen  325 MG tablet Commonly known as: TYLENOL  Take 2 tablets (650 mg total) by mouth every 6 (six) hours as needed for mild pain or headache (fever >/= 101).   albuterol  108 (90 Base) MCG/ACT inhaler Commonly known as: VENTOLIN  HFA Inhale 2 puffs into the lungs every 6 (six) hours as needed. For bronchitis What changed: Another medication with the same name was removed. Continue taking this medication, and follow the directions you see here.   amoxicillin -clavulanate 875-125 MG tablet Commonly known as: AUGMENTIN  Take 1 tablet by mouth every 12 (twelve) hours for 2 days.   apixaban  5 MG Tabs  tablet Commonly known as: ELIQUIS  Take 1 tablet (5 mg total) by mouth 2 (two) times daily.   Azelastine  HCl 0.15 % Soln PLACE 1 SPRAY INTO THE NOSE 2 (TWO) TIMES DAILY.   bisacodyl  5 MG EC tablet Commonly known as: DULCOLAX Take 2 tablets (10 mg total) by mouth daily as needed for moderate constipation or severe constipation.   budesonide -formoterol  160-4.5 MCG/ACT inhaler Commonly known as: SYMBICORT Inhale 2 puffs into the lungs 2 (two) times daily.   CENTRUM SILVER PO Take 1 tablet by mouth.   cyanocobalamin  1000 MCG tablet Commonly known as: VITAMIN B12 Take 1,000 mcg by mouth daily.   diclofenac  Sodium 1 % Gel Commonly known as: VOLTAREN  Apply 2 g topically 4 (four) times daily.   donepezil  5 MG tablet Commonly known as: ARICEPT  TAKE 1 TABLET (5 MG TOTAL) BY MOUTH DAILY.   fluticasone  50 MCG/ACT nasal spray Commonly known as: FLONASE  PLACE 1 SPRAY INTO BOTH NOSTRILS 2 (TWO) TIMES DAILY   gabapentin  100 MG capsule Commonly known as: NEURONTIN  Take 100 mg by mouth daily.   guaiFENesin  600 MG 12 hr tablet Commonly known as: MUCINEX  Take 600 mg by mouth 2 (two) times daily. For 5 days starting 04/20/2023   hydrALAZINE  50 MG tablet Commonly known as: APRESOLINE  Take 50 mg by mouth every 8 (eight) hours as  needed (hypertension).   Jardiance  10 MG Tabs tablet Generic drug: empagliflozin  Take 10 mg by mouth daily.   lidocaine  5 % Commonly known as: LIDODERM  Place 1 patch onto the skin at bedtime.   losartan  100 MG tablet Commonly known as: COZAAR  Take 100 mg by mouth daily.   metFORMIN 500 MG tablet Commonly known as: GLUCOPHAGE Take 500 mg by mouth 2 (two) times daily with a meal.   oxybutynin  10 MG 24 hr tablet Commonly known as: DITROPAN -XL Take 10 mg by mouth daily.   oxyCODONE  5 MG immediate release tablet Commonly known as: Oxy IR/ROXICODONE  Take 1 tablet (5 mg total) by mouth every 6 (six) hours as needed for moderate pain (pain score 4-6) or  severe pain (pain score 7-10).   pantoprazole  40 MG tablet Commonly known as: PROTONIX  Take 40 mg by mouth daily.   potassium chloride  10 MEQ tablet Commonly known as: KLOR-CON  Take 20 mEq by mouth daily.   rosuvastatin  5 MG tablet Commonly known as: CRESTOR  Take 5 mg by mouth daily.   senna 8.6 MG tablet Commonly known as: SENOKOT Take 2 tablets by mouth daily.   SYSTANE COMPLETE OP Place 1 drop into both eyes 4 (four) times daily as needed (itching).   ZYRTEC  PO Take by mouth daily.        Follow-up Information     Albina GORMAN Dine, MD Follow up in 1 week(s).   Specialty: Internal Medicine Contact information: [address] [place] [address] [phone]                No Known Allergies  You were cared for by a hospitalist during your hospital stay. If you have any questions about your discharge medications or the care you received while you were in the hospital after you are discharged, you can call the unit and asked to speak with the hospitalist on call if the hospitalist that took care of you is not available. Once you are discharged, your primary care physician will handle any further medical issues. Please note that no refills for any discharge medications will be authorized once you are discharged, as it is imperative that you return to your primary care physician (or establish a relationship with a primary care physician if you do not have one) for your aftercare needs so that they can reassess your need for medications and monitor your lab values.  You were cared for by a hospitalist during your hospital stay. If you have any questions about your discharge medications or the care you received while you were in the hospital after you are discharged, you can call the unit and asked to speak with the hospitalist on call if the hospitalist that took care of you is not available. Once you are discharged, your primary care physician will handle any further  medical issues. Please note that NO REFILLS for any discharge medications will be authorized once you are discharged, as it is imperative that you return to your primary care physician (or establish a relationship with a primary care physician if you do not have one) for your aftercare needs so that they can reassess your need for medications and monitor your lab values.  Please request your Prim.MD to go over all Hospital Tests and Procedure/Radiological results at the follow up, please get all Hospital records sent to your Prim MD by signing hospital release before you go home.  Get CBC, CMP, 2 view Chest X ray checked  by Primary MD  during your next visit or SNF MD in 5-7 days ( we routinely change or add medications that can affect your baseline labs and fluid status, therefore we recommend that you get the mentioned basic workup next visit with your PCP, your PCP may decide not to get them or add new tests based on their clinical decision)  On your next visit with your primary care physician please Get Medicines reviewed and adjusted.  If you experience worsening of your admission symptoms, develop shortness of breath, life threatening emergency, suicidal or homicidal thoughts you must seek medical attention immediately by calling 911 or calling your MD immediately  if symptoms less severe.  You Must read complete instructions/literature along with all the possible adverse reactions/side effects for all the Medicines you take and that have been prescribed to you. Take any new Medicines after you have completely understood and accpet all the possible adverse reactions/side effects.   Do not drive, operate heavy machinery, perform activities at heights, swimming or participation in water activities or provide baby sitting services if your were admitted for syncope or siezures until you have seen by Primary MD or a Neurologist and advised to do so again.  Do not drive when taking Pain  medications.   Procedures/Studies: DG ESOPHAGUS W SINGLE CM (SOL OR THIN BA) Result Date: 04/24/2023 CLINICAL DATA:  Dysphagia with difficulty keeping liquids down. Hypothermia and constipation. EXAM: ESOPHOGRAM/BARIUM SWALLOW TECHNIQUE: Single contrast examination was performed using  thin barium. FLUOROSCOPY: Radiation Exposure Index (as provided by the fluoroscopic device): 21.7 mGy Kerma COMPARISON:  Chest radiographs and abdominopelvic CT 04/23/2023. Barium esophagram 12/25/2012. FINDINGS: This is a limited study which was performed in the supine, semi erect and RPO positions. The patient was unable to swallow a barium tablet. Moderate esophageal dysmotility with a decreased primary stripping wave and increased tertiary contractions. Liquid barium flowed freely into the stomach, although there was some stasis of the esophageal contents in the semi erect position. No mucosal irregularity identified. Difficult to exclude narrowing of the distal esophagus on this limited study. No aspiration or reflux was observed. IMPRESSION: Limited study as described. Moderate esophageal dysmotility without evidence of esophageal obstruction. Patient was unable to swallow a barium tablet, and assessment for a distal esophageal stricture is limited. Electronically Signed   By: Elsie Perone M.D.   On: 04/24/2023 11:00   CT ABDOMEN PELVIS WO CONTRAST Result Date: 04/23/2023 CLINICAL DATA:  Abdominal pain. Left upper quadrant pain for 1 week and constipation for 1 week. Urinary retention for 1 week. Patient also as cough and congestion. EXAM: CT ABDOMEN AND PELVIS WITHOUT CONTRAST TECHNIQUE: Multidetector CT imaging of the abdomen and pelvis was performed following the standard protocol without IV contrast. RADIATION DOSE REDUCTION: This exam was performed according to the departmental dose-optimization program which includes automated exposure control, adjustment of the mA and/or kV according to patient size and/or use of  iterative reconstruction technique. COMPARISON:  None. FINDINGS: Lower chest: Small bilateral pleural effusions with overlying atelectasis. Hepatobiliary: 1.2 cm low-density structure over the anterior aspect of segment 4 measures 27 Hounsfield units. Subcapsular low-density structure with peripheral calcification is noted overlying the dome of right lobe of liver measuring 20 Hounsfield units and 1.6 cm, image 17/2. Sludge versus tiny stones noted within the dependent portion of the gallbladder, image 40/2. No signs of gallbladder wall inflammation or bile duct dilatation. Pancreas: Unremarkable. No pancreatic ductal dilatation or surrounding inflammatory changes. Spleen: Normal in size without focal abnormality. Adrenals/Urinary Tract: Bilateral adrenal  nodules are identified. The right adrenal nodule measures 1.2 cm and 39 Hounsfield units, image 29/2. Left adrenal nodule measures 1.3 cm and 28 Hounsfield units, image 32/2 within the inferior pole of the right kidney there is a 6 mm stone, image 38/2 within the inferior pole of the left kidney there is an 8 mm stone, image 41/2. No mass or signs of obstructive uropathy identified bilaterally. Urinary bladder is unremarkable. Stomach/Bowel: Stomach appears nondistended. There is no pathologic dilatation of the large or small bowel loops. The appendix is visualized and appears normal. Diffuse colonic diverticulosis is identified. This is most severe at the level of the sigmoid colon. Equivocal wall thickening involving the descending colon and sigmoid colon noted with surrounding mild pericolonic haziness. Findings are equivocal for underlying colitis. No significant free fluid or fluid collections identified. Vascular/Lymphatic: Aortic atherosclerosis. No aneurysm. No signs of abdominopelvic adenopathy. Reproductive: Status post hysterectomy. No adnexal masses. Other: No significant free fluid or fluid collections. No signs of pneumoperitoneum. Fat containing  umbilical hernia noted. Musculoskeletal: There are degenerative changes involving both hip joints. Curvature of the lumbar spine is convex towards the right. Lumbar degenerative disc disease noted. No acute or suspicious osseous abnormality. IMPRESSION: 1. Equivocal wall thickening involving the descending colon and sigmoid colon noted with surrounding mild pericolonic haziness. Findings are equivocal for underlying colitis. 2. Diffuse colonic diverticulosis. Most severe at the level of the sigmoid colon. 3. Bilateral nonobstructing renal calculi. 4. Small bilateral pleural effusions with overlying atelectasis. 5. Bilateral adrenal nodules are identified. These are indeterminate. 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 Consider 12 month follow-up adrenal protocol CT. 6. Sludge versus tiny stones noted within the dependent portion of the gallbladder. No signs of gallbladder wall inflammation or bile duct dilatation. 7. Fat containing umbilical hernia. 8.  Aortic Atherosclerosis (ICD10-I70.0). Electronically Signed   By: Waddell Calk M.D.   On: 04/23/2023 13:43   DG Chest Portable 1 View Result Date: 04/23/2023 CLINICAL DATA:  Cough and shortness of breath. EXAM: PORTABLE CHEST 1 VIEW COMPARISON:  04/22/2021 FINDINGS: The cardiac enlargement and aortic atherosclerosis. Low lung volumes. Pulmonary vascular congestion. No signs of pleural effusion or airspace consolidation. Visualized osseous structures are unremarkable. IMPRESSION: Cardiac enlargement and pulmonary vascular congestion. Electronically Signed   By: Waddell Calk M.D.   On: 04/23/2023 12:29     The results of significant diagnostics from this hospitalization (including imaging, microbiology, ancillary and laboratory) are listed below for reference.     Microbiology: Recent Results (from the past 240 hours)  Culture, blood (Routine x 2)     Status: None   Collection Time: 04/23/23 10:55 AM   Specimen:  BLOOD  Result Value Ref Range Status   Specimen Description   Final    BLOOD RIGHT BRACHIAL ARTERY Performed at [facility], [address] [place], [place] [address]    Special Requests   Final    BOTTLES DRAWN AEROBIC AND ANAEROBIC Blood Culture adequate volume Performed at [facility], [address] [place], [place] [address]    Culture   Final    NO GROWTH 5 DAYS Performed at [facility], [address], [place], [place] [address]    Report Status 04/28/2023 FINAL  Final  Resp panel by RT-PCR (RSV, Flu A&B, Covid) Anterior Nasal Swab     Status: None   Collection Time: 04/23/23 11:40 AM   Specimen: Anterior Nasal Swab  Result Value  Ref Range Status   SARS Coronavirus 2 by RT PCR NEGATIVE NEGATIVE Final    Comment: (NOTE) SARS-CoV-2 target nucleic acids are NOT DETECTED.  The SARS-CoV-2 RNA is generally detectable in upper respiratory specimens during the acute phase of infection. The lowest concentration of SARS-CoV-2 viral copies this assay can detect is 138 copies/mL. A negative result does not preclude SARS-Cov-2 infection and should not be used as the sole basis for treatment or other patient management decisions. A negative result may occur with  improper specimen collection/handling, submission of specimen other than nasopharyngeal swab, presence of viral mutation(s) within the areas targeted by this assay, and inadequate number of viral copies(<138 copies/mL). A negative result must be combined with clinical observations, patient history, and epidemiological information. The expected result is Negative.  Fact Sheet for Patients:  bloggercourse.com  Fact Sheet for Healthcare Providers:  seriousbroker.it  This test is no t yet approved or cleared by the [place]  FDA and  has been authorized for detection and/or diagnosis of SARS-CoV-2 by FDA under an Emergency  Use Authorization (EUA). This EUA will remain  in effect (meaning this test can be used) for the duration of the COVID-19 declaration under Section 564(b)(1) of the Act, 21 U.S.C.section 360bbb-3(b)(1), unless the authorization is terminated  or revoked sooner.       Influenza A by PCR NEGATIVE NEGATIVE Final   Influenza B by PCR NEGATIVE NEGATIVE Final    Comment: (NOTE) The Xpert Xpress SARS-CoV-2/FLU/RSV plus assay is intended as an aid in the diagnosis of influenza from Nasopharyngeal swab specimens and should not be used as a sole basis for treatment. Nasal washings and aspirates are unacceptable for Xpert Xpress SARS-CoV-2/FLU/RSV testing.  Fact Sheet for Patients: bloggercourse.com  Fact Sheet for Healthcare Providers: seriousbroker.it  This test is not yet approved or cleared by the [place]  FDA and has been authorized for detection and/or diagnosis of SARS-CoV-2 by FDA under an Emergency Use Authorization (EUA). This EUA will remain in effect (meaning this test can be used) for the duration of the COVID-19 declaration under Section 564(b)(1) of the Act, 21 U.S.C. section 360bbb-3(b)(1), unless the authorization is terminated or revoked.     Resp Syncytial Virus by PCR NEGATIVE NEGATIVE Final    Comment: (NOTE) Fact Sheet for Patients: bloggercourse.com  Fact Sheet for Healthcare Providers: seriousbroker.it  This test is not yet approved or cleared by the [place]  FDA and has been authorized for detection and/or diagnosis of SARS-CoV-2 by FDA under an Emergency Use Authorization (EUA). This EUA will remain in effect (meaning this test can be used) for the duration of the COVID-19 declaration under Section 564(b)(1) of the Act, 21 U.S.C. section 360bbb-3(b)(1), unless the authorization is terminated or revoked.  Performed at [facility],  [address] [place], [place] [address]   Culture, blood (Routine x 2)     Status: None   Collection Time: 04/23/23 11:49 AM   Specimen: BLOOD  Result Value Ref Range Status   Specimen Description   Final    BLOOD RIGHT ANTECUBITAL Performed at [facility], [address], [place], [place] [address]    Special Requests   Final    BOTTLES DRAWN AEROBIC AND ANAEROBIC Blood Culture results may not be optimal due to an inadequate volume of blood received in culture bottles Performed at [name], [address], [place], [place] [address]    Culture   Final    NO GROWTH  5 DAYS Performed at [facility], [address], [place], [place] [address]    Report Status 04/28/2023 FINAL  Final  Urine Culture     Status: None   Collection Time: 04/23/23  1:53 PM   Specimen: Urine, Catheterized  Result Value Ref Range Status   Specimen Description   Final    URINE, CATHETERIZED Performed at [facility], [address] [place], [place] [address]    Special Requests   Final    NONE Performed at [facility], [address], [place], [place] [address]    Culture   Final    NO GROWTH Performed at [facility], [address], [place], [place] [address]    Report Status 04/24/2023 FINAL  Final  MRSA Next Gen by PCR, Nasal     Status: None   Collection Time: 04/24/23  4:19 AM   Specimen: Nasal Mucosa; Nasal Swab  Result Value Ref Range Status   MRSA by PCR Next Gen NOT DETECTED NOT DETECTED Final    Comment: (NOTE) The GeneXpert MRSA Assay (FDA approved for NASAL specimens only), is one component of a comprehensive MRSA colonization surveillance program. It is not intended to diagnose MRSA infection nor to guide or monitor treatment for MRSA infections. Test performance is not FDA approved in patients less than 54 years old. Performed at [facility], [address] [place], [place] [address]      Labs: BNP (last 3 results) Recent Labs    03/11/23 1640  BNP 196.9*   Basic Metabolic Panel: Recent Labs  Lab 04/23/23 1149 04/24/23 0419 04/24/23 1940 04/25/23 1118 04/26/23 0702 04/27/23 0941 04/28/23 0656  NA  --    < > 130* 133* 135 138 138  K  --    < > 5.6* 5.8* 5.4* 5.0 4.9  CL  --    < > 103 104 103 105 104  CO2  --    < > 20* 22 26 24 29   GLUCOSE  --    < > 70 69* 70 93 82  BUN  --    < > 23 22 20 15 16   CREATININE  --    < > 1.51* 1.47* 1.24* 0.92 1.03*  CALCIUM   --    < > 8.5* 9.2 9.6 9.7 9.3  MG 2.2  --   --   --   --   --  1.6*   < > = values in this interval not displayed.   Liver Function Tests: Recent Labs  Lab 04/23/23 1055  AST 15  ALT 24  ALKPHOS 74  BILITOT 0.5  PROT 6.9  ALBUMIN 3.4*   No results for input(s): LIPASE, AMYLASE in the last 168 hours. No results for input(s): AMMONIA in the last 168 hours. CBC: Recent Labs  Lab 04/23/23 1055 04/24/23 0419 04/28/23 0522  WBC 3.1* 3.4* 5.4  NEUTROABS 1.9  --   --   HGB 10.3* 9.9* 9.6*  HCT 34.0* 32.9* 29.8*  MCV 98.3 98.8 95.2  PLT 112* 112* 125*   Cardiac Enzymes: No results for input(s): CKTOTAL, CKMB, CKMBINDEX, TROPONINI in the last 168 hours. BNP: Invalid input(s): POCBNP CBG: No results for input(s): GLUCAP in the last 168 hours. D-Dimer No results for input(s): DDIMER in the last 72 hours. Hgb A1c No results for input(s): HGBA1C in the last 72 hours. Lipid Profile No results for input(s): CHOL, HDL, LDLCALC, TRIG, CHOLHDL, LDLDIRECT  in the last 72 hours. Thyroid  function studies No results for input(s): TSH, T4TOTAL, T3FREE, THYROIDAB in the last 72 hours.  Invalid input(s): FREET3 Anemia work up No results for input(s): VITAMINB12, FOLATE, FERRITIN, TIBC, IRON, RETICCTPCT in the last 72 hours. Urinalysis    Component Value Date/Time   COLORURINE AMBER (A) 04/23/2023 1353    APPEARANCEUR CLOUDY (A) 04/23/2023 1353   LABSPEC 1.015 04/23/2023 1353   PHURINE 5.0 04/23/2023 1353   GLUCOSEU 50 (A) 04/23/2023 1353   HGBUR LARGE (A) 04/23/2023 1353   BILIRUBINUR NEGATIVE 04/23/2023 1353   KETONESUR NEGATIVE 04/23/2023 1353   PROTEINUR 100 (A) 04/23/2023 1353   UROBILINOGEN 1.0 10/05/2009 1218   NITRITE NEGATIVE 04/23/2023 1353   LEUKOCYTESUR SMALL (A) 04/23/2023 1353   Sepsis Labs Recent Labs  Lab 04/23/23 1055 04/24/23 0419 04/28/23 0522  WBC 3.1* 3.4* 5.4   Microbiology Recent Results (from the past 240 hours)  Culture, blood (Routine x 2)     Status: None   Collection Time: 04/23/23 10:55 AM   Specimen: BLOOD  Result Value Ref Range Status   Specimen Description   Final    BLOOD RIGHT BRACHIAL ARTERY Performed at [facility], [address], [place], [place] [address]    Special Requests   Final    BOTTLES DRAWN AEROBIC AND ANAEROBIC Blood Culture adequate volume Performed at [facility], [address], [place], [place] [address]    Culture   Final    NO GROWTH 5 DAYS Performed at [facility], [address], [place], [place] [address]    Report Status 04/28/2023 FINAL  Final  Resp panel by RT-PCR (RSV, Flu A&B, Covid) Anterior Nasal Swab     Status: None   Collection Time: 04/23/23 11:40 AM   Specimen: Anterior Nasal Swab  Result Value Ref Range Status   SARS Coronavirus 2 by RT PCR NEGATIVE NEGATIVE Final    Comment: (NOTE) SARS-CoV-2 target nucleic acids are NOT DETECTED.  The SARS-CoV-2 RNA is generally detectable in upper respiratory specimens during the acute phase of infection. The lowest concentration of SARS-CoV-2 viral copies this assay can detect is 138 copies/mL. A negative result does not preclude SARS-Cov-2 infection and should not be used as the sole basis for treatment or other patient management decisions. A negative result may occur with  improper specimen  collection/handling, submission of specimen other than nasopharyngeal swab, presence of viral mutation(s) within the areas targeted by this assay, and inadequate number of viral copies(<138 copies/mL). A negative result must be combined with clinical observations, patient history, and epidemiological information. The expected result is Negative.  Fact Sheet for Patients:  bloggercourse.com  Fact Sheet for Healthcare Providers:  seriousbroker.it  This test is no t yet approved or cleared by the [place]  FDA and  has been authorized for detection and/or diagnosis of SARS-CoV-2 by FDA under an Emergency Use Authorization (EUA). This EUA will remain  in effect (meaning this test can be used) for the duration of the COVID-19 declaration under Section 564(b)(1) of the Act, 21 U.S.C.section 360bbb-3(b)(1), unless the authorization is terminated  or revoked sooner.       Influenza A by PCR NEGATIVE NEGATIVE Final   Influenza B by PCR NEGATIVE NEGATIVE Final    Comment: (NOTE) The Xpert Xpress SARS-CoV-2/FLU/RSV plus assay is intended as an aid in the diagnosis of influenza from Nasopharyngeal swab specimens and should not be used as a sole basis for treatment. Nasal washings  and aspirates are unacceptable for Xpert Xpress SARS-CoV-2/FLU/RSV testing.  Fact Sheet for Patients: bloggercourse.com  Fact Sheet for Healthcare Providers: seriousbroker.it  This test is not yet approved or cleared by the [place]  FDA and has been authorized for detection and/or diagnosis of SARS-CoV-2 by FDA under an Emergency Use Authorization (EUA). This EUA will remain in effect (meaning this test can be used) for the duration of the COVID-19 declaration under Section 564(b)(1) of the Act, 21 U.S.C. section 360bbb-3(b)(1), unless the authorization is terminated or revoked.     Resp Syncytial  Virus by PCR NEGATIVE NEGATIVE Final    Comment: (NOTE) Fact Sheet for Patients: bloggercourse.com  Fact Sheet for Healthcare Providers: seriousbroker.it  This test is not yet approved or cleared by the [place]  FDA and has been authorized for detection and/or diagnosis of SARS-CoV-2 by FDA under an Emergency Use Authorization (EUA). This EUA will remain in effect (meaning this test can be used) for the duration of the COVID-19 declaration under Section 564(b)(1) of the Act, 21 U.S.C. section 360bbb-3(b)(1), unless the authorization is terminated or revoked.  Performed at [facility], [address], [place], [place] [address]   Culture, blood (Routine x 2)     Status: None   Collection Time: 04/23/23 11:49 AM   Specimen: BLOOD  Result Value Ref Range Status   Specimen Description   Final    BLOOD RIGHT ANTECUBITAL Performed at [facility], [address], [place], [place] [address]    Special Requests   Final    BOTTLES DRAWN AEROBIC AND ANAEROBIC Blood Culture results may not be optimal due to an inadequate volume of blood received in culture bottles Performed at [facility], [address] [place], [place] [address]    Culture   Final    NO GROWTH 5 DAYS Performed at [facility], [address], [place], [place] [address]    Report Status 04/28/2023 FINAL  Final  Urine Culture     Status: None   Collection Time: 04/23/23  1:53 PM   Specimen: Urine, Catheterized  Result Value Ref Range Status   Specimen Description   Final    URINE, CATHETERIZED Performed at [facility], [address] [place], [place] [address]    Special Requests   Final    NONE Performed at [facility], [address], [place], [place] [address]    Culture   Final    NO GROWTH Performed at [facility], [address]  [place], [place] [address]    Report Status 04/24/2023 FINAL  Final  MRSA Next Gen by PCR, Nasal     Status: None   Collection Time: 04/24/23  4:19 AM   Specimen: Nasal Mucosa; Nasal Swab  Result Value Ref Range Status   MRSA by PCR Next Gen NOT DETECTED NOT DETECTED Final    Comment: (NOTE) The GeneXpert MRSA Assay (FDA approved for NASAL specimens only), is one component of a comprehensive MRSA colonization surveillance program. It is not intended to diagnose MRSA infection nor to guide or monitor treatment for MRSA infections. Test performance is not FDA approved in patients less than 33 years old. Performed at [facility], [address], [place], [place] [address]      Time coordinating discharge:  I have spent 35 minutes face to face with the patient and on the ward discussing the patients care, assessment, plan and disposition  with other care givers. >50% of the time was devoted counseling the patient about the risks and benefits of treatment/Discharge disposition and coordinating care.   SIGNED:   Burgess JAYSON Dare, MD  [facility] 04/28/2023, 11:11 AM   If 7PM-7AM, please contact night-coverage

## 2023-04-28 NOTE — Plan of Care (Signed)

## 2023-04-28 NOTE — TOC Transition Note (Signed)
 Transition of Care [facility]) - Discharge Note   Patient Details  Name: Elizabeth [name] MRN: [id number] Date of Birth: [birth year]  Transition of Care [facility]) CM/SW Contact:  Hoy [name], LCSW Phone Number: 04/28/2023, 11:58 AM   Clinical Narrative:    Spoke with pt's daughter who shares that she and pt are agreeable to return to [facility]. They share that they are no longer seeking placement elsewhere. Confirmed with [facility] pt is able to return to their facility today.  Pt will be returning to room 125A. RN to call report to [phone]. DC packet with signed prescriptions and DC summary placed at RN station. PTAR called at 12:02pm for transportation.    Final next level of care: Long Term Nursing Home Barriers to Discharge: Barriers Resolved   Patient Goals and CMS Choice Patient states their goals for this hospitalization and ongoing recovery are:: [name] says she will d/c to SNF CMS Medicare.gov Compare Post Acute Care list provided to:: Patient Represenative (must comment) ([name]) Choice offered to / list presented to : Adult Children [place] ownership interest in [facility].provided to:: Adult Children    Discharge Placement   Existing PASRR number confirmed : 04/28/23          Patient chooses bed at: Other - please specify in the comment section below: [facility]) Patient to be transferred to facility by: PTAR Name of family member notified: [name] Patient and family notified of of transfer: 04/28/23  Discharge Plan and Services Additional resources added to the After Visit Summary for     Discharge Planning Services: CM Consult Post Acute Care Choice: Skilled Nursing Facility          DME Arranged: N/A DME Agency: NA                  Social Drivers of Health (SDOH) Interventions SDOH Screenings   Food Insecurity: No Food Insecurity (04/26/2023)  Housing: Low Risk  (04/26/2023)   Transportation Needs: No Transportation Needs (04/26/2023)  Utilities: Not At Risk (04/26/2023)  Social Connections: Unknown (04/24/2023)  Tobacco Use: Medium Risk (04/23/2023)     Readmission Risk Interventions    04/28/2023   11:56 AM  Readmission Risk Prevention Plan  Transportation Screening Complete  PCP or Specialist Appt within 5-7 Days Complete  Home Care Screening Complete  Medication Review (RN CM) Complete

## 2023-04-28 NOTE — [place] FL2 (Signed)
 [place]  MEDICAID FL2 LEVEL OF CARE FORM     IDENTIFICATION  Patient Name: Elizabeth [name] Birthdate: 1940-09-28 Sex: female Admission Date (Current Location): 04/23/2023  [facility] and [place] Number:  Producer, Television/film/video and Address:  [facility],  [address] [place], [place] [address]      Provider Number: [id number]  Attending Physician Name and Address:  Caleen Burgess BROCKS, MD  Relative Name and Phone Number:  [name](POA),[name]  [phone]    Current Level of Care: Hospital Recommended Level of Care: Skilled Nursing Facility, Nursing Facility Prior Approval Number:    Date Approved/Denied:   PASRR Number: [id number]  Discharge Plan: SNF    Current Diagnoses: Patient Active Problem List   Diagnosis Date Noted   Colitis 04/24/2023   Sepsis (HCC) 04/23/2023   (HFpEF) heart failure with preserved ejection fraction (HCC) 12/24/2022   Morbid obesity (HCC) 12/24/2022   OSA (obstructive sleep apnea) 03/28/2022   Diabetes mellitus without complication (HCC) 06/19/2021   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) 05/12/2021   COVID-19 virus infection 05/12/2021   Generalized weakness 05/12/2021   Type 2 diabetes mellitus (HCC) 05/12/2021   Hypertension associated with diabetes (HCC) 05/12/2021   Asthma 05/12/2021   GERD (gastroesophageal reflux disease)    Leukopenia    Chronic venous insufficiency 01/17/2017   Pain of left breast 01/17/2012    Orientation RESPIRATION BLADDER Height & Weight     Self, Time, Situation, Place  O2 (PRN) Incontinent Weight: 279 lb 1.6 oz (126.6 kg) Height:  5' 4 (162.6 cm)  BEHAVIORAL SYMPTOMS/MOOD NEUROLOGICAL BOWEL NUTRITION STATUS      Continent Diet (See DC summary)  AMBULATORY STATUS COMMUNICATION OF NEEDS Skin   Extensive Assist Verbally Normal                       Personal Care Assistance Level of Assistance  Bathing, Feeding, Dressing Bathing Assistance: Maximum  assistance Feeding assistance: Limited assistance Dressing Assistance: Maximum assistance     Functional Limitations Info  Sight, Hearing, Speech Sight Info: Adequate Hearing Info: Adequate Speech Info: Adequate    SPECIAL CARE FACTORS FREQUENCY  PT (By licensed PT), OT (By licensed OT)                    Contractures Contractures Info: Not present    Additional Factors Info  Code Status, Allergies Code Status Info: FULL Allergies Info: No Known Allergies           Current Medications (04/28/2023):  This is the current hospital active medication list Current Facility-Administered Medications  Medication Dose Route Frequency Provider Last Rate Last Admin   amoxicillin -clavulanate (AUGMENTIN ) 875-125 MG per tablet 1 tablet  1 tablet Oral Q12H Amin, Ankit C, MD       apixaban  (ELIQUIS ) tablet 5 mg  5 mg Oral BID Ellington, Abby K, RPH   5 mg at 04/28/23 0900   bisacodyl  (DULCOLAX) EC tablet 10 mg  10 mg Oral BID Amin, Ankit C, MD   10 mg at 04/28/23 0900   Chlorhexidine  Gluconate Cloth 2 % PADS 6 each  6 each Topical Daily Dorrell, Robert, MD   6 each at 04/27/23 1200   donepezil  (ARICEPT ) tablet 5 mg  5 mg Oral Daily Dorrell, Robert, MD   5 mg at 04/28/23 0900   fluticasone  (FLONASE ) 50 MCG/ACT nasal spray 1 spray  1 spray Each Nare Daily Drusilla Sabas RAMAN, MD   1  spray at 04/28/23 0901   fluticasone  furoate-vilanterol (BREO ELLIPTA ) 200-25 MCG/ACT 1 puff  1 puff Inhalation Daily Dena Charleston, MD   1 puff at 04/28/23 0804   guaiFENesin  (ROBITUSSIN) 100 MG/5ML liquid 10 mL  10 mL Oral Q6H Lama, Sabas RAMAN, MD   10 mL at 04/28/23 0900   hydrALAZINE  (APRESOLINE ) tablet 25 mg  25 mg Oral Q6H PRN Drusilla Sabas RAMAN, MD       ipratropium-albuterol  (DUONEB) 0.5-2.5 (3) MG/3ML nebulizer solution 3 mL  3 mL Nebulization Q4H PRN Amin, Ankit C, MD       metoprolol  tartrate (LOPRESSOR ) injection 5 mg  5 mg Intravenous Q4H PRN Amin, Ankit C, MD       ondansetron  (ZOFRAN ) tablet 4 mg  4 mg Oral  Q6H PRN Dorrell, Robert, MD       Or   ondansetron  (ZOFRAN ) injection 4 mg  4 mg Intravenous Q6H PRN Dena Charleston, MD   4 mg at 04/24/23 9964   oxybutynin  (DITROPAN -XL) 24 hr tablet 10 mg  10 mg Oral Daily Dorrell, Robert, MD   10 mg at 04/28/23 0900   oxyCODONE  (Oxy IR/ROXICODONE ) immediate release tablet 5 mg  5 mg Oral Q4H PRN Dena Charleston, MD   5 mg at 04/28/23 9246   pantoprazole  (PROTONIX ) EC tablet 40 mg  40 mg Oral Daily Dorrell, Charleston, MD   40 mg at 04/28/23 0900   polyethylene glycol (MIRALAX  / GLYCOLAX ) packet 17 g  17 g Oral BID Dena Charleston, MD   17 g at 04/28/23 0857   rosuvastatin  (CRESTOR ) tablet 5 mg  5 mg Oral Daily Dorrell, Robert, MD   5 mg at 04/28/23 0901   senna (SENOKOT) tablet 8.6 mg  1 tablet Oral BID Dena Charleston, MD   8.6 mg at 04/28/23 0900   senna-docusate (Senokot-S) tablet 1 tablet  1 tablet Oral QHS PRN Amin, Ankit C, MD       sodium chloride  (OCEAN) 0.65 % nasal spray 1 spray  1 spray Each Nare PRN Drusilla Sabas RAMAN, MD         Discharge Medications: Please see discharge summary for a list of discharge medications.  Relevant Imaging Results:  Relevant Lab Results:   Additional Information SSN: [id number]  Elizabeth [name] Elizabeth Bigness, LCSW

## 2023-06-23 ENCOUNTER — Ambulatory Visit: Payer: 59 | Admitting: Pulmonary Disease

## 2023-07-05 NOTE — Progress Notes (Unsigned)
 Cardiology Office Note:  .   Date:  07/07/2023  ID:  Elizabeth [name], DOB 1940/07/31, MRN [id number] PCP: Elizabeth Monday, MD  [facility] Providers Cardiologist:  Elizabeth Schultz, MD    Patient Profile: .      PMH Hypertension Type 2 diabetes mellitus OSA on CPAP GERD  CKD PAF  She established 12/23/2022 with Elizabeth [name] for surgical clearance.  Prior echo 2018 LVEF 55 to 60%, mild LVH, grade 1 diastolic dysfunction.  She self-reported a history of congestive heart failure but had not been seen by cardiology previously.  She was encouraged to use her CPAP regularly and use compression socks for peripheral edema.  BMP, lipid panel were recommended but not collected.  Preoperative clearance was granted.  Updated echo 01/2023 LVEF 55 to 60%, grade 2 diastolic dysfunction, RV normal, mildly elevated PASP, mild MR, trivial AI with no stenosis.  Last cardiology clinic visit was 03/11/2023 with Elizabeth Shields, NP.  She was residing at [facility] and presented with her daughter.  Plan to be there long-term due to mold in their home making her unable to return.  She had decided against having sinus surgery.  She noted a few episodes of hypoglycemia with blood sugar in the 50s.  She noted mild lightheadedness with position changes and orthostatic precautions were discussed.  She reported staying well-hydrated.  History of bilateral lower extremity edema had improved with wearing compression stockings.  She was no longer on Lasix.  Addition of SGLT2 inhibitor was discussed but she preferred to defer medication changes at that time.  BP improved on recheck.  Lab work was obtained which revealed normal kidney function and electrolytes.  BMP showed mild volume overload and she was advised to start Jardiance 10 mg daily.  Cholesterol was at goal.  She requested to follow-up at our [facility] location therefore, primary cardiologist was changed to Dr. Anne [name].  Admission 04/2023 who presented  with complaints of abdominal pain, constipation, and suprapubic fullness.  CT abdomen pelvis showed concerns of colitis.  She was also initially hypothermic.  Potassium level was 5.6.  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  Seen by Elizabeth [name] GI and no further work up recommended.  She was to continue regular/thin liquids.  Creatinine was 1.68 at admission, baseline felt to be around 1.0.  Hydrochlorothiazide was discontinued.  She was found to be in atrial fibrillation with slow ventricular response.  Eliquis was started.       History of Present Illness: .   Elizabeth [name] is a very pleasant 83 y.o. female who is here today for follow-up of PAF.  She was traveling in a wheelchair and is accompanied by her daughter. She reports she is feeling well. She denies palpitations or chest pain. She reports no problems with Eliquis.  She has not been able to walk since hospital admission January 2025. She is working with physical therapy twice a day and has been able to stand but not yet take steps.  She does not participate in many social activities per daughter. Pt says she enjoys playing games in her room. There have been no concerns about her heart rate or blood pressure from her care team at the nursing home. She denies shortness of breath, orthopnea, PND, or weight gain. She has some mild bilateral LE edema. Says she is on a regular diet but expresses dissatisfaction with the quality of the food at the nursing home. No  acute cardiac concerns today.   Discussed the use of AI scribe software for clinical note transcription with the patient, who gave verbal consent to proceed.   ROS: See HPI       Studies Reviewed: Marland Kitchen        No results found for: "LIPOA"   Risk Assessment/Calculations:    CHA2DS2-VASc Score = 6   This indicates a 9.7% annual risk of stroke. The patient's score is based upon: CHF History: 1 HTN History: 1 Diabetes History: 1 Stroke  History: 0 Vascular Disease History: 0 Age Score: 2 Gender Score: 1    HYPERTENSION CONTROL Vitals:   07/07/23 1339 07/07/23 1717  BP: (!) 152/60 (!) 148/58    The patient's blood pressure is elevated above target today.  In order to address the patient's elevated BP: Blood pressure will be monitored at home to determine if medication changes need to be made.          Physical Exam:   VS:  BP (!) 148/58   Pulse 63   Ht 5\' 4"  (1.626 m)   Wt 265 lb (120.2 kg)   SpO2 93%   BMI 45.49 kg/m    Wt Readings from Last 3 Encounters:  07/07/23 265 lb (120.2 kg)  04/23/23 279 lb 1.6 oz (126.6 kg)  03/11/23 279 lb (126.6 kg)    GEN: Well nourished, well developed in no acute distress NECK: No JVD; No carotid bruits CARDIAC: RRR, no murmurs, rubs, gallops RESPIRATORY:  Clear to auscultation without rales, wheezing or rhonchi  ABDOMEN: Soft, non-tender, non-distended EXTREMITIES:  Bilateral LE edema; No deformity     ASSESSMENT AND PLAN: .    PAF on chronic anticoagulation: Found to be in A-fib with well-controlled ventricular rate during admission January 2025.  Today, clinically she appears to be in sinus rhythm.  HR is well-controlled.  She is asymptomatic. No indication to add AV nodal blocking agent. She is on Eliquis 5 mg twice daily which is appropriate dose for stroke prevention for CHA2DS2-VASc score of 6. No bleeding concerns. Continue Eliquis.   Chronic HFpEF: 2D echo 01/10/2023 with LVEF 55 to 60%, G2 DD. She has chronic stable leg edema. Admits she is frequently sitting with legs in dependent position. Volume status is difficult to assess due to body habitus.  She denies weight gain, shortness of breath, orthopnea, PND.  She is working with PT to become more mobile.  Continue GDMT including losartan, Jardiance.  Renal function stable on labs completed 04/28/2023.  Hypertension: BP is elevated and remains elevated my recheck.  There are no BP or pulse recordings from her SNF.   Advised monitoring of BP and if consistently > 140 mmHg systolic, give 1/2 tablet of hydralazine (25 mg) every 8 hours.  Aortic atherosclerosis: Aortic atherosclerosis noted on CT 04/23/2023. She denies chest pain, dyspnea, or other symptoms concerning for angina.  No indication for further ischemic evaluation at this time.  No recent lipid panel to review (was previously scheduled and not completed).  She is on rosuvastatin 5 mg daily and reports she does not want to undergo additional testing. Continue rosuvastatin.         Disposition:6 months with Dr. Anne [name] or APP  Signed, Eligha Bridegroom, NP-C

## 2023-07-07 ENCOUNTER — Encounter: Payer: Self-pay | Admitting: Nurse Practitioner

## 2023-07-07 ENCOUNTER — Ambulatory Visit: Payer: 59 | Attending: Nurse Practitioner | Admitting: Nurse Practitioner

## 2023-07-07 VITALS — BP 148/58 | HR 63 | Ht 64.0 in | Wt 265.0 lb

## 2023-07-07 DIAGNOSIS — I5032 Chronic diastolic (congestive) heart failure: Secondary | ICD-10-CM

## 2023-07-07 DIAGNOSIS — I700 Atherosclerosis of aorta: Secondary | ICD-10-CM

## 2023-07-07 DIAGNOSIS — I480 Paroxysmal atrial fibrillation: Secondary | ICD-10-CM | POA: Diagnosis not present

## 2023-07-07 DIAGNOSIS — E1159 Type 2 diabetes mellitus with other circulatory complications: Secondary | ICD-10-CM | POA: Diagnosis not present

## 2023-07-07 DIAGNOSIS — I152 Hypertension secondary to endocrine disorders: Secondary | ICD-10-CM

## 2023-07-07 DIAGNOSIS — Z7901 Long term (current) use of anticoagulants: Secondary | ICD-10-CM

## 2023-07-07 NOTE — Patient Instructions (Signed)
 Medication Instructions:   If your BP systolic (top number) consistently remains above 140 please give one half tablet of hydrazaline   *If you need a refill on your cardiac medications before your next appointment, please call your pharmacy*   Lab Work:  None ordered.  If you have labs (blood work) drawn today and your tests are completely normal, you will receive your results only by: MyChart Message (if you have MyChart) OR A paper copy in the mail If you have any lab test that is abnormal or we need to change your treatment, we will call you to review the results.   Testing/Procedures:  None ordered.    Follow-Up: At [facility], you and your health needs are our priority.  As part of our continuing mission to provide you with exceptional heart care, we have created designated Provider Care Teams.  These Care Teams include your primary Cardiologist (physician) and Advanced Practice Providers (APPs -  Physician Assistants and Nurse Practitioners) who all work together to provide you with the care you need, when you need it.  We recommend signing up for the patient portal called "MyChart".  Sign up information is provided on this After Visit Summary.  MyChart is used to connect with patients for Virtual Visits (Telemedicine).  Patients are able to view lab/test results, encounter notes, upcoming appointments, etc.  Non-urgent messages can be sent to your provider as well.   To learn more about what you can do with MyChart, go to [email or web address].    Your next appointment:   6 month(s)  Provider:   Donato Schultz, MD     Other Instructions  Your physician wants you to follow-up in: 6 months.  You will receive a reminder letter in the mail two months in advance. If you don't receive a letter, please call our office to schedule the follow-up appointment.     1st Floor: - Lobby - Registration  - Pharmacy  - Lab - Cafe  2nd Floor: - PV Lab - Diagnostic  Testing (echo, CT, nuclear med)  3rd Floor: - Vacant  4th Floor: - TCTS (cardiothoracic surgery) - AFib Clinic - Structural Heart Clinic - Vascular Surgery  - Vascular Ultrasound  5th Floor: - HeartCare Cardiology (general and EP) - Clinical Pharmacy for coumadin, hypertension, lipid, weight-loss medications, and med management appointments    Valet parking services will be available as well.

## 2023-07-09 ENCOUNTER — Encounter (HOSPITAL_BASED_OUTPATIENT_CLINIC_OR_DEPARTMENT_OTHER): Payer: 59 | Admitting: Nurse Practitioner

## 2023-07-30 ENCOUNTER — Ambulatory Visit (INDEPENDENT_AMBULATORY_CARE_PROVIDER_SITE_OTHER): Admitting: Physician Assistant

## 2023-07-30 ENCOUNTER — Encounter: Payer: Self-pay | Admitting: Physician Assistant

## 2023-07-30 VITALS — BP 138/76 | HR 72 | Resp 20 | Ht 64.0 in

## 2023-07-30 DIAGNOSIS — R413 Other amnesia: Secondary | ICD-10-CM

## 2023-07-30 MED ORDER — DONEPEZIL HCL 5 MG PO TABS: 5.0000 mg | ORAL_TABLET | Freq: Every day | ORAL | 3 refills | Status: AC

## 2023-07-30 NOTE — Patient Instructions (Signed)
 It was a pleasure to see you today at our office.   Recommendations:  Follow up in 6 month Follow with pulmonary on the sleep apnea Continue  Donepezil 5 mg  daily. Side effects discussed    Whom to call:  Memory  decline, memory medications: Call our office [phone]       For assessment of decision of mental capacity and competency:  Call Dr. Erick Blinks, geriatric psychiatrist at [phone]          RECOMMENDATIONS FOR ALL PATIENTS WITH MEMORY PROBLEMS: 1. Continue to exercise (Recommend 30 minutes of walking everyday, or 3 hours every week) 2. Increase social interactions - continue going to [place] and enjoy social gatherings with friends and family 3. Eat healthy, avoid fried foods and eat more fruits and vegetables 4. Maintain adequate blood pressure, blood sugar, and blood cholesterol level. Reducing the risk of stroke and cardiovascular disease also helps promoting better memory. 5. Avoid stressful situations. Live a simple life and avoid aggravations. Organize your time and prepare for the next day in anticipation. 6. Sleep well, avoid any interruptions of sleep and avoid any distractions in the bedroom that may interfere with adequate sleep quality 7. Avoid sugar, avoid sweets as there is a strong link between excessive sugar intake, diabetes, and cognitive impairment We discussed the Mediterranean diet, which has been shown to help patients reduce the risk of progressive memory disorders and reduces cardiovascular risk. This includes eating fish, eat fruits and green leafy vegetables, nuts like almonds and hazelnuts, walnuts, and also use olive oil. Avoid fast foods and fried foods as much as possible. Avoid sweets and sugar as sugar use has been linked to worsening of memory function.  There is always a concern of gradual progression of memory problems. If this is the case, then we may need to adjust level of care according to patient needs. Support, both to the  patient and caregiver, should then be put into place.       FALL PRECAUTIONS: Be cautious when walking. Scan the area for obstacles that may increase the risk of trips and falls. When getting up in the mornings, sit up at the edge of the bed for a few minutes before getting out of bed. Consider elevating the bed at the head end to avoid drop of blood pressure when getting up. Walk always in a well-lit room (use night lights in the walls). Avoid area rugs or power cords from appliances in the middle of the walkways. Use a walker or a cane if necessary and consider physical therapy for balance exercise. Get your eyesight checked regularly.  FINANCIAL OVERSIGHT: Supervision, especially oversight when making financial decisions or transactions is also recommended.  HOME SAFETY: Consider the safety of the kitchen when operating appliances like stoves, microwave oven, and blender. Consider having supervision and share cooking responsibilities until no longer able to participate in those. Accidents with firearms and other hazards in the house should be identified and addressed as well.   ABILITY TO BE LEFT ALONE: If patient is unable to contact 911 operator, consider using LifeLine, or when the need is there, arrange for someone to stay with patients. Smoking is a fire hazard, consider supervision or cessation. Risk of wandering should be assessed by caregiver and if detected at any point, supervision and safe proof recommendations should be instituted.  MEDICATION SUPERVISION: Inability to self-administer medication needs to be constantly addressed. Implement a mechanism to ensure safe administration of the medications.  Mediterranean Diet A Mediterranean diet refers to food and lifestyle choices that are based on the traditions of countries located on the [employer]. This way of eating has been shown to help prevent certain conditions and improve outcomes for people who have chronic  diseases, like kidney disease and heart disease. What are tips for following this plan? Lifestyle  Cook and eat meals together with your family, when possible. Drink enough fluid to keep your urine clear or pale yellow. Be physically active every day. This includes: Aerobic exercise like running or swimming. Leisure activities like gardening, walking, or housework. Get 7-8 hours of sleep each night. If recommended by your health care provider, drink red wine in moderation. This means 1 glass a day for nonpregnant women and 2 glasses a day for men. A glass of wine equals 5 oz (150 mL). Reading food labels  Check the serving size of packaged foods. For foods such as rice and pasta, the serving size refers to the amount of cooked product, not dry. Check the total fat in packaged foods. Avoid foods that have saturated fat or trans fats. Check the ingredients list for added sugars, such as corn syrup. Shopping  At the grocery store, buy most of your food from the areas near the walls of the store. This includes: Fresh fruits and vegetables (produce). Grains, beans, nuts, and seeds. Some of these may be available in unpackaged forms or large amounts (in bulk). Fresh seafood. Poultry and eggs. Low-fat dairy products. Buy whole ingredients instead of prepackaged foods. Buy fresh fruits and vegetables in-season from local farmers markets. Buy frozen fruits and vegetables in resealable bags. If you do not have access to quality fresh seafood, buy precooked frozen shrimp or canned fish, such as tuna, salmon, or sardines. Buy small amounts of raw or cooked vegetables, salads, or olives from the deli or salad bar at your store. Stock your pantry so you always have certain foods on hand, such as olive oil, canned tuna, canned tomatoes, rice, pasta, and beans. Cooking  Cook foods with extra-virgin olive oil instead of using butter or other vegetable oils. Have meat as a side dish, and have vegetables  or grains as your main dish. This means having meat in small portions or adding small amounts of meat to foods like pasta or stew. Use beans or vegetables instead of meat in common dishes like chili or lasagna. Experiment with different cooking methods. Try roasting or broiling vegetables instead of steaming or sauteing them. Add frozen vegetables to soups, stews, pasta, or rice. Add nuts or seeds for added healthy fat at each meal. You can add these to yogurt, salads, or vegetable dishes. Marinate fish or vegetables using olive oil, lemon juice, garlic, and fresh herbs. Meal planning  Plan to eat 1 vegetarian meal one day each week. Try to work up to 2 vegetarian meals, if possible. Eat seafood 2 or more times a week. Have healthy snacks readily available, such as: Vegetable sticks with hummus. Greek yogurt. Fruit and nut trail mix. Eat balanced meals throughout the week. This includes: Fruit: 2-3 servings a day Vegetables: 4-5 servings a day Low-fat dairy: 2 servings a day Fish, poultry, or lean meat: 1 serving a day Beans and legumes: 2 or more servings a week Nuts and seeds: 1-2 servings a day Whole grains: 6-8 servings a day Extra-virgin olive oil: 3-4 servings a day Limit red meat and sweets to only a few servings a month What are my food choices?  Mediterranean diet Recommended Grains: Whole-grain pasta. Brown rice. Bulgar wheat. Polenta. Couscous. Whole-wheat bread. Orpah Cobb. Vegetables: Artichokes. Beets. Broccoli. Cabbage. Carrots. Eggplant. Green beans. Chard. Kale. Spinach. Onions. Leeks. Peas. Squash. Tomatoes. Peppers. Radishes. Fruits: Apples. Apricots. Avocado. Berries. Bananas. Cherries. Dates. Figs. Grapes. Lemons. Melon. Oranges. Peaches. Plums. Pomegranate. Meats and other protein foods: Beans. Almonds. Sunflower seeds. Pine nuts. Peanuts. Cod. Salmon. Scallops. Shrimp. Tuna. Tilapia. Clams. Oysters. Eggs. Dairy: Low-fat milk. Cheese. Greek yogurt. Beverages:  Water. Red wine. Herbal tea. Fats and oils: Extra virgin olive oil. Avocado oil. Grape seed oil. Sweets and desserts: [place] yogurt with honey. Baked apples. Poached pears. Trail mix. Seasoning and other foods: Basil. Cilantro. Coriander. Cumin. Mint. Parsley. Sage. Rosemary. Tarragon. Garlic. Oregano. Thyme. Pepper. Balsalmic vinegar. Tahini. Hummus. Tomato sauce. Olives. Mushrooms. Limit these Grains: Prepackaged pasta or rice dishes. Prepackaged cereal with added sugar. Vegetables: Deep fried potatoes (french fries). Fruits: Fruit canned in syrup. Meats and other protein foods: Beef. Pork. Lamb. Poultry with skin. Hot dogs. Tomasa Blase. Dairy: Ice cream. Sour cream. Whole milk. Beverages: Juice. Sugar-sweetened soft drinks. Beer. Liquor and spirits. Fats and oils: Butter. Canola oil. Vegetable oil. Beef fat (tallow). Lard. Sweets and desserts: Cookies. Cakes. Pies. Candy. Seasoning and other foods: Mayonnaise. Premade sauces and marinades. The items listed may not be a complete list. Talk with your dietitian about what dietary choices are right for you. Summary The Mediterranean diet includes both food and lifestyle choices. Eat a variety of fresh fruits and vegetables, beans, nuts, seeds, and whole grains. Limit the amount of red meat and sweets that you eat. Talk with your health care provider about whether it is safe for you to drink red wine in moderation. This means 1 glass a day for nonpregnant women and 2 glasses a day for men. A glass of wine equals 5 oz (150 mL). This information is not intended to replace advice given to you by your health care provider. Make sure you discuss any questions you have with your health care provider. Document Released: 11/30/2015 Document Revised: 01/02/2016 Document Reviewed: 11/30/2015 Elsevier Interactive Patient Education  2017 [employer].

## 2023-07-30 NOTE — Progress Notes (Addendum)
 Assessment/Plan:   Memory impairment    Elizabeth [name] is a very pleasant 83 y.o. RH female with a history of hypertension, hyperlipidemia, OSA not on CPAP,  CHF, recent sepsis, seen today in follow up for memory loss.  Patient had been prescribed donepezil 5 mg daily during her prior visit on 07/11/2022 (losing to follow-up since), but the patient came because she was concerned about the side effects of it.  MMSEtoday is 22/30.  Memory is stable.  Mood is good.  She now lives in assisted living.  Follow up in  6 months. Continue donepezil 5 mg daily as directed, side effects discussed  Follow-up with pulmonary for sleep apnea, recommend using CPAP. Follow-up with ENT for sinus disease Recommend good control of her cardiovascular risk factors Continue to control mood as per PCP     Subjective:    This patient is accompanied in the office by her daughter who supplements the history.  Previous records as well as any outside records available were reviewed prior to todays visit. Patient was last seen on 09/02/2022.  Last MoCA on 07/11/2022 was 15/30.    Any changes in memory since last visit? " About the same" According to her daughter she continues to have difficulty remembering conversations, names, new information.  She is not aware of these changes. repeats oneself?  Endorsed Disoriented when walking into a room?  Patient denies    Leaving objects?  May misplace things but not in unusual places   Wandering behavior?  denies   Any personality changes since last visit?  denies   Any worsening depression?:  Denies.   Hallucinations or paranoia?  Denies.   Seizures? denies    Any sleep changes? Sleeps well.  Denies vivid dreams, REM behavior or sleepwalking   Sleep apnea?  Endorsed, she does not like using the CPAP, and sleeps in her chair. Uses O2 at home.  Any hygiene concerns? Denies.  Independent of bathing and dressing?  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  She has  someone who helps her. Does the patient needs help with medications?  Facility is in charge   Who is in charge of the finances?  Daughter is in charge     Any changes in appetite?  denies     Patient have trouble swallowing? Denies.   Does the patient cook? No Any headaches?   denies   Chronic back pain  denies   Ambulates with difficulty?  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  Recent falls or head injuries? denies     Unilateral weakness, numbness or tingling? denies   Any tremors?  Denies   Any anosmia?  Denies   Any incontinence of urine?  Endorsed, urge incontinence. Uses Depends  Any bowel dysfunction?   Denies      Patient lives  at [facility]  Does the patient drive? No longer drives    MRI of the brain 08/09/2022 personally reviewed, remarkable for mild to moderate chronic small vessel ischemic disease, without acute intracranial abnormalities    Initial visit 07/11/2022 How long did patient have memory difficulties?  For about 2 years.  The patient was noted to have some difficulty remembering recent conversations and names of people.  She is not aware of these changes.  This was seen by family.    repeats oneself?  Endorsed for the last year  Disoriented when walking into a room?  Patient denies   Leaving objects in unusual places?   denies    Wandering behavior? denies   Any personality changes since last visit? denies  "She may get a little meaner than before" Any history of depression?: Her father was killed when she was 10 and since then she does not talk about it, she keeps stuff.  "She was the black sheep of the family, per daughter report " Hallucinations or paranoia?   She saw her dead father one time.  Seizures? denies    Any sleep changes?  Denies  vivid dreams, REM behavior or sleepwalking   Sleep apnea? She does not want to use the CPAP and sleeps in the chair.  Any hygiene concerns?   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  Endorsed  Does the patient need help with medications? Daughter  is in charge   Who is in charge of the finances? Daughter is in charge     Any changes in appetite? Denies  Patient have trouble swallowing?  denies   Does the patient cook?  No Any kitchen accidents such as leaving the stove on?  Her son reported that 1 time she has put beads in the spaghetti, she denies  Any headaches?  denies   Chronic back pain?  denies    Ambulates with difficulty?  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  No loss of consciousness or head injury.  They blame it on using slippery shoes.  She now uses a walker to ambulate.   Recent falls or head injuries? denies     Vision changes? Denies  Unilateral weakness, numbness or tingling?  denies   Any tremors?  denies   Any anosmia?  denies   Any incontinence of urine? Endorsed Any bowel dysfunction?    Endorsed, "she waits till last minute ands then she soils herself " Patient lives   with her son History of heavy alcohol intake? denies   History of heavy tobacco use? denies   Family history of dementia?  Mother had dementia  Does patient drive?  No longer drives     PREVIOUS MEDICATIONS:   CURRENT MEDICATIONS:  Outpatient Encounter Medications as of 07/30/2023  Medication Sig   acetaminophen (TYLENOL) 325 MG tablet Take 2 tablets (650 mg total) by mouth every 6 (six) hours as needed for mild pain or headache (fever >/= 101).   albuterol (PROVENTIL HFA;VENTOLIN HFA) 108 (90 BASE) MCG/ACT inhaler Inhale 2 puffs into the lungs every 6 (six) hours as needed. For bronchitis   apixaban (ELIQUIS) 5 MG TABS tablet Take 1 tablet (5 mg total) by mouth 2 (two) times daily.   Azelastine HCl 0.15 % SOLN PLACE 1 SPRAY INTO THE NOSE 2 (TWO) TIMES DAILY.   bisacodyl (DULCOLAX) 5 MG EC tablet Take 2  tablets (10 mg total) by mouth daily as needed for moderate constipation or severe constipation.   budesonide-formoterol (SYMBICORT) 160-4.5 MCG/ACT inhaler Inhale 2 puffs into the lungs 2 (two) times daily.   Cetirizine HCl (ZYRTEC PO) Take by mouth daily.   cyanocobalamin (VITAMIN B12) 1000 MCG tablet Take 1,000 mcg by mouth daily.   diclofenac Sodium (VOLTAREN) 1 % GEL Apply 2 g topically 4 (four) times daily.   donepezil (ARICEPT) 5 MG tablet TAKE 1 TABLET (5 MG TOTAL) BY MOUTH DAILY.   fluticasone (FLONASE) 50  MCG/ACT nasal spray PLACE 1 SPRAY INTO BOTH NOSTRILS 2 (TWO) TIMES DAILY   gabapentin (NEURONTIN) 100 MG capsule Take 100 mg by mouth daily.   guaiFENesin (MUCINEX) 600 MG 12 hr tablet Take 600 mg by mouth 2 (two) times daily. For 5 days starting 04/20/2023   hydrALAZINE (APRESOLINE) 50 MG tablet Take 25 mg by mouth every 8 (eight) hours as needed (hypertension). If BP consistently systolic (top number) stays above 140.   JARDIANCE 10 MG TABS tablet Take 10 mg by mouth daily.   lidocaine (LIDODERM) 5 % Place 1 patch onto the skin at bedtime.   losartan (COZAAR) 100 MG tablet Take 100 mg by mouth daily.   metFORMIN (GLUCOPHAGE) 500 MG tablet Take 500 mg by mouth 2 (two) times daily with a meal.   Multiple Vitamins-Minerals (CENTRUM SILVER PO) Take 1 tablet by mouth.   oxybutynin (DITROPAN-XL) 10 MG 24 hr tablet Take 10 mg by mouth daily.   oxyCODONE (OXY IR/ROXICODONE) 5 MG immediate release tablet Take 1 tablet (5 mg total) by mouth every 6 (six) hours as needed for moderate pain (pain score 4-6) or severe pain (pain score 7-10).   pantoprazole (PROTONIX) 40 MG tablet Take 40 mg by mouth daily.   potassium chloride (KLOR-CON) 10 MEQ tablet Take 20 mEq by mouth daily.   Propylene Glycol (SYSTANE COMPLETE OP) Place 1 drop into both eyes 4 (four) times daily as needed (itching).   rosuvastatin (CRESTOR) 5 MG tablet Take 5 mg by mouth daily.   senna (SENOKOT) 8.6 MG tablet Take 2 tablets  by mouth daily.   No facility-administered encounter medications on file as of 07/30/2023.       07/30/2023   11:00 AM  MMSE - Mini Mental State Exam  Orientation to time 2  Orientation to Place 3  Registration 3  Attention/ Calculation 4  Recall 2  Language- name 2 objects 2  Language- repeat 1  Language- follow 3 step command 3  Language- read & follow direction 1  Write a sentence 1  Copy design 0  Total score 22      07/11/2022    1:00 PM  Montreal Cognitive Assessment   Visuospatial/ Executive (0/5) 1  Naming (0/3) 3  Attention: Read list of digits (0/2) 2  Attention: Read list of letters (0/1) 1  Attention: Serial 7 subtraction starting at 100 (0/3) 0  Language: Repeat phrase (0/2) 2  Language : Fluency (0/1) 0  Abstraction (0/2) 1  Delayed Recall (0/5) 2  Orientation (0/6) 2  Total 14  Adjusted Score (based on education) 15    Objective:     PHYSICAL EXAMINATION:    VITALS:   Vitals:   07/30/23 1055 07/30/23 1102  BP: (!) 150/90 138/76  Pulse: 72   Resp: 20   SpO2: 95%   Height: 5\' 4"  (1.626 m)     GEN:  The patient appears stated age and is in NAD. HEENT:  Normocephalic, atraumatic.   Neurological examination:  General: NAD, well-groomed, appears stated age. Orientation: The patient is alert. Oriented to person, not to place and not to date Cranial nerves: There is good facial symmetry.The speech is fluent and clear. No aphasia or dysarthria. Fund of knowledge is appropriate. Recent and remote memory are impaired. Attention and concentration are reduced.  Able to name objects and repeat phrases.  Hearing is intact to conversational tone.  Sensation: Sensation is intact to light touch throughout Motor: Strength is at least antigravity x4. DTR's  2/4 in UE/LE     Movement examination: Tone: There is normal tone in the UE/LE Abnormal movements:  no tremor.  No myoclonus.  No asterixis.   Coordination:  There is no decremation with RAM's. Normal  finger to nose  Gait and Station: The patient has significant difficulty arising out of a deep-seated chair without the use of the hands due to right lower extremity lymphedema and left shoulder arthritis.  She is not in wheelchair, unable to test gait and stride  Thank you for allowing [place] the opportunity to participate in the care of this nice patient. Please do not hesitate to contact us for any questions or concerns.   Total time spent on today's visit was 23 minutes dedicated to this patient today, preparing to see patient, examining the patient, ordering tests and/or medications and counseling the patient, documenting clinical information in the EHR or other health record, independently interpreting results and communicating results to the patient/family, discussing treatment and goals, answering patient's questions and coordinating care.  Cc:  Sherron Monday, MD  Marlowe Kays 07/30/2023 11:16 AM

## 2023-09-05 ENCOUNTER — Other Ambulatory Visit: Payer: Self-pay | Admitting: Registered Nurse

## 2023-09-05 DIAGNOSIS — Z1231 Encounter for screening mammogram for malignant neoplasm of breast: Secondary | ICD-10-CM

## 2023-09-14 ENCOUNTER — Encounter (HOSPITAL_COMMUNITY): Payer: Self-pay

## 2023-09-14 ENCOUNTER — Emergency Department (HOSPITAL_COMMUNITY)

## 2023-09-14 ENCOUNTER — Inpatient Hospital Stay (HOSPITAL_COMMUNITY)
Admission: EM | Admit: 2023-09-14 | Discharge: 2023-09-18 | DRG: 291 | Disposition: A | Discharge status: Skilled Nursing Facility | Source: Skilled Nursing Facility | Attending: Internal Medicine | Admitting: Internal Medicine

## 2023-09-14 DIAGNOSIS — G4733 Obstructive sleep apnea (adult) (pediatric): Secondary | ICD-10-CM | POA: Diagnosis present

## 2023-09-14 DIAGNOSIS — G9341 Metabolic encephalopathy: Secondary | ICD-10-CM | POA: Diagnosis present

## 2023-09-14 DIAGNOSIS — J9621 Acute and chronic respiratory failure with hypoxia: Secondary | ICD-10-CM | POA: Diagnosis not present

## 2023-09-14 DIAGNOSIS — F0394 Unspecified dementia, unspecified severity, with anxiety: Secondary | ICD-10-CM | POA: Diagnosis present

## 2023-09-14 DIAGNOSIS — J320 Chronic maxillary sinusitis: Secondary | ICD-10-CM | POA: Diagnosis present

## 2023-09-14 DIAGNOSIS — J45901 Unspecified asthma with (acute) exacerbation: Secondary | ICD-10-CM | POA: Diagnosis present

## 2023-09-14 DIAGNOSIS — Z8249 Family history of ischemic heart disease and other diseases of the circulatory system: Secondary | ICD-10-CM

## 2023-09-14 DIAGNOSIS — Z90710 Acquired absence of both cervix and uterus: Secondary | ICD-10-CM

## 2023-09-14 DIAGNOSIS — I152 Hypertension secondary to endocrine disorders: Secondary | ICD-10-CM | POA: Diagnosis present

## 2023-09-14 DIAGNOSIS — E1159 Type 2 diabetes mellitus with other circulatory complications: Secondary | ICD-10-CM | POA: Diagnosis not present

## 2023-09-14 DIAGNOSIS — E785 Hyperlipidemia, unspecified: Secondary | ICD-10-CM | POA: Diagnosis present

## 2023-09-14 DIAGNOSIS — Z1152 Encounter for screening for COVID-19: Secondary | ICD-10-CM

## 2023-09-14 DIAGNOSIS — E119 Type 2 diabetes mellitus without complications: Secondary | ICD-10-CM | POA: Diagnosis not present

## 2023-09-14 DIAGNOSIS — J9602 Acute respiratory failure with hypercapnia: Secondary | ICD-10-CM | POA: Insufficient documentation

## 2023-09-14 DIAGNOSIS — I10 Essential (primary) hypertension: Secondary | ICD-10-CM | POA: Diagnosis present

## 2023-09-14 DIAGNOSIS — Z79899 Other long term (current) drug therapy: Secondary | ICD-10-CM

## 2023-09-14 DIAGNOSIS — I480 Paroxysmal atrial fibrillation: Secondary | ICD-10-CM | POA: Diagnosis present

## 2023-09-14 DIAGNOSIS — Z66 Do not resuscitate: Secondary | ICD-10-CM | POA: Diagnosis present

## 2023-09-14 DIAGNOSIS — Z6841 Body Mass Index (BMI) 40.0 and over, adult: Secondary | ICD-10-CM

## 2023-09-14 DIAGNOSIS — I130 Hypertensive heart and chronic kidney disease with heart failure and stage 1 through stage 4 chronic kidney disease, or unspecified chronic kidney disease: Principal | ICD-10-CM | POA: Diagnosis present

## 2023-09-14 DIAGNOSIS — Z7901 Long term (current) use of anticoagulants: Secondary | ICD-10-CM

## 2023-09-14 DIAGNOSIS — J9622 Acute and chronic respiratory failure with hypercapnia: Secondary | ICD-10-CM | POA: Diagnosis present

## 2023-09-14 DIAGNOSIS — N189 Chronic kidney disease, unspecified: Secondary | ICD-10-CM | POA: Diagnosis present

## 2023-09-14 DIAGNOSIS — Z87442 Personal history of urinary calculi: Secondary | ICD-10-CM

## 2023-09-14 DIAGNOSIS — Z7984 Long term (current) use of oral hypoglycemic drugs: Secondary | ICD-10-CM

## 2023-09-14 DIAGNOSIS — Z833 Family history of diabetes mellitus: Secondary | ICD-10-CM

## 2023-09-14 DIAGNOSIS — I872 Venous insufficiency (chronic) (peripheral): Secondary | ICD-10-CM | POA: Diagnosis present

## 2023-09-14 DIAGNOSIS — K5900 Constipation, unspecified: Secondary | ICD-10-CM | POA: Diagnosis present

## 2023-09-14 DIAGNOSIS — I5030 Unspecified diastolic (congestive) heart failure: Secondary | ICD-10-CM | POA: Diagnosis present

## 2023-09-14 DIAGNOSIS — I5033 Acute on chronic diastolic (congestive) heart failure: Secondary | ICD-10-CM | POA: Diagnosis not present

## 2023-09-14 DIAGNOSIS — K219 Gastro-esophageal reflux disease without esophagitis: Secondary | ICD-10-CM | POA: Diagnosis present

## 2023-09-14 DIAGNOSIS — Z87891 Personal history of nicotine dependence: Secondary | ICD-10-CM

## 2023-09-14 DIAGNOSIS — Z91199 Patient's noncompliance with other medical treatment and regimen due to unspecified reason: Secondary | ICD-10-CM

## 2023-09-14 DIAGNOSIS — Z7951 Long term (current) use of inhaled steroids: Secondary | ICD-10-CM

## 2023-09-14 DIAGNOSIS — E8729 Other acidosis: Secondary | ICD-10-CM | POA: Diagnosis present

## 2023-09-14 DIAGNOSIS — Z9981 Dependence on supplemental oxygen: Secondary | ICD-10-CM

## 2023-09-14 DIAGNOSIS — I5032 Chronic diastolic (congestive) heart failure: Secondary | ICD-10-CM | POA: Diagnosis present

## 2023-09-14 DIAGNOSIS — E1122 Type 2 diabetes mellitus with diabetic chronic kidney disease: Secondary | ICD-10-CM | POA: Diagnosis present

## 2023-09-14 LAB — COMPREHENSIVE METABOLIC PANEL WITH GFR
ALT: 8 U/L (ref 0–44)
AST: 13 U/L — ABNORMAL LOW (ref 15–41)
Albumin: 2.8 g/dL — ABNORMAL LOW (ref 3.5–5.0)
Alkaline Phosphatase: 51 U/L (ref 38–126)
Anion gap: 13 (ref 5–15)
BUN: 12 mg/dL (ref 8–23)
CO2: 37 mmol/L — ABNORMAL HIGH (ref 22–32)
Calcium: 9.3 mg/dL (ref 8.9–10.3)
Chloride: 91 mmol/L — ABNORMAL LOW (ref 98–111)
Creatinine, Ser: 0.48 mg/dL (ref 0.44–1.00)
GFR, Estimated: >60 mL/min (ref >60)
Glucose, Bld: 84 mg/dL (ref 70–99)
Potassium: 4.9 mmol/L (ref 3.5–5.1)
Sodium: 141 mmol/L (ref 135–145)
Total Bilirubin: 0.5 mg/dL (ref 0.0–1.2)
Total Protein: 6.5 g/dL (ref 6.5–8.1)

## 2023-09-14 LAB — BLOOD GAS, VENOUS
Acid-Base Excess: 18 mmol/L — ABNORMAL HIGH (ref 0.0–2.0)
Bicarbonate: 51.4 mmol/L — ABNORMAL HIGH (ref 20.0–28.0)
O2 Saturation: 92.2 %
Patient temperature: 37
pCO2, Ven: 120 mmHg (ref 44–60)
pH, Ven: 7.24 — ABNORMAL LOW (ref 7.25–7.43)
pO2, Ven: 58 mmHg — ABNORMAL HIGH (ref 32–45)

## 2023-09-14 LAB — URINALYSIS, W/ REFLEX TO CULTURE (INFECTION SUSPECTED)
Bacteria, UA: NONE SEEN
Bilirubin Urine: NEGATIVE
Glucose, UA: >=500 mg/dL — AB
Hgb urine dipstick: NEGATIVE
Ketones, ur: NEGATIVE mg/dL
Leukocytes,Ua: NEGATIVE
Nitrite: NEGATIVE
Protein, ur: >=300 mg/dL — AB
Specific Gravity, Urine: 1.014 (ref 1.005–1.030)
pH: 5 (ref 5.0–8.0)

## 2023-09-14 LAB — BLOOD GAS, ARTERIAL
Acid-Base Excess: 23.4 mmol/L — ABNORMAL HIGH (ref 0.0–2.0)
Bicarbonate: 52 mmol/L — ABNORMAL HIGH (ref 20.0–28.0)
Drawn by: 20012
Expiratory PAP: 6 cmH2O
FIO2: 35 %
Inspiratory PAP: 16 cmH2O
Mode: POSITIVE
O2 Content: POSITIVE L/min
O2 Saturation: 97.5 %
Patient temperature: 36.8
RATE: 18 {breaths}/min
pCO2 arterial: 81 mmHg (ref 32–48)
pH, Arterial: 7.41 (ref 7.35–7.45)
pO2, Arterial: 67 mmHg — ABNORMAL LOW (ref 83–108)

## 2023-09-14 LAB — CBC WITH DIFFERENTIAL/PLATELET
Abs Immature Granulocytes: 0.04 10*3/uL (ref 0.00–0.07)
Basophils Absolute: 0 10*3/uL (ref 0.0–0.1)
Basophils Relative: 0 %
Eosinophils Absolute: 0 10*3/uL (ref 0.0–0.5)
Eosinophils Relative: 1 %
HCT: 36.2 % (ref 36.0–46.0)
Hemoglobin: 9.9 g/dL — ABNORMAL LOW (ref 12.0–15.0)
Immature Granulocytes: 1 %
Lymphocytes Relative: 21 %
Lymphs Abs: 0.7 10*3/uL (ref 0.7–4.0)
MCH: 28.9 pg (ref 26.0–34.0)
MCHC: 27.3 g/dL — ABNORMAL LOW (ref 30.0–36.0)
MCV: 105.5 fL — ABNORMAL HIGH (ref 80.0–100.0)
Monocytes Absolute: 0.4 10*3/uL (ref 0.1–1.0)
Monocytes Relative: 11 %
Neutro Abs: 2.1 10*3/uL (ref 1.7–7.7)
Neutrophils Relative %: 66 %
Platelets: 171 10*3/uL (ref 150–400)
RBC: 3.43 MIL/uL — ABNORMAL LOW (ref 3.87–5.11)
RDW: 13.9 % (ref 11.5–15.5)
WBC: 3.2 10*3/uL — ABNORMAL LOW (ref 4.0–10.5)
nRBC: 0 % (ref 0.0–0.2)

## 2023-09-14 LAB — TROPONIN I (HIGH SENSITIVITY)
Troponin I (High Sensitivity): 29 ng/L — ABNORMAL HIGH (ref <18)
Troponin I (High Sensitivity): 31 ng/L — ABNORMAL HIGH (ref <18)
Troponin I (High Sensitivity): 30 ng/L — ABNORMAL HIGH (ref <18)

## 2023-09-14 LAB — BRAIN NATRIURETIC PEPTIDE: B Natriuretic Peptide: 328.1 pg/mL — ABNORMAL HIGH (ref 0.0–100.0)

## 2023-09-14 LAB — MRSA NEXT GEN BY PCR, NASAL: MRSA by PCR Next Gen: DETECTED — AB

## 2023-09-14 LAB — MAGNESIUM: Magnesium: 1.7 mg/dL (ref 1.7–2.4)

## 2023-09-14 LAB — I-STAT CG4 LACTIC ACID, ED: Lactic Acid, Venous: 0.6 mmol/L (ref 0.5–1.9)

## 2023-09-14 MED ORDER — PANTOPRAZOLE SODIUM 40 MG PO TBEC
40.0000 mg | DELAYED_RELEASE_TABLET | Freq: Every day | ORAL | Status: DC
  Administered 2023-09-15 – 2023-09-18 (×4): 40 mg via ORAL
  Filled 2023-09-14 (×4): qty 1

## 2023-09-14 MED ORDER — SODIUM CHLORIDE 0.9 % IV SOLN: 250.0000 mL | INTRAVENOUS | Status: AC | PRN

## 2023-09-14 MED ORDER — DICLOFENAC SODIUM 1 % EX GEL
2.0000 g | Freq: Four times a day (QID) | CUTANEOUS | Status: DC
  Administered 2023-09-14 – 2023-09-18 (×10): 2 g via TOPICAL
  Filled 2023-09-14 (×2): qty 100

## 2023-09-14 MED ORDER — BUDESONIDE 0.5 MG/2ML IN SUSP
2.0000 mg | Freq: Two times a day (BID) | RESPIRATORY_TRACT | Status: DC
  Administered 2023-09-14: 2 mg via RESPIRATORY_TRACT
  Administered 2023-09-15: 0.5 mg via RESPIRATORY_TRACT
  Filled 2023-09-14 (×2): qty 8

## 2023-09-14 MED ORDER — VITAMIN B-12 1000 MCG PO TABS
1000.0000 ug | ORAL_TABLET | Freq: Every day | ORAL | Status: DC
  Administered 2023-09-15 – 2023-09-18 (×4): 1000 ug via ORAL
  Filled 2023-09-14 (×4): qty 1

## 2023-09-14 MED ORDER — CETIRIZINE HCL 10 MG PO TABS
10.0000 mg | ORAL_TABLET | Freq: Every day | ORAL | Status: DC
  Administered 2023-09-15 – 2023-09-18 (×4): 10 mg via ORAL
  Filled 2023-09-14 (×5): qty 1

## 2023-09-14 MED ORDER — ONDANSETRON HCL 4 MG PO TABS: 4.0000 mg | ORAL_TABLET | Freq: Four times a day (QID) | ORAL | Status: DC | PRN

## 2023-09-14 MED ORDER — IPRATROPIUM-ALBUTEROL 0.5-2.5 (3) MG/3ML IN SOLN
3.0000 mL | Freq: Four times a day (QID) | RESPIRATORY_TRACT | Status: DC
Start: 2023-09-14 — End: 2023-09-16
  Administered 2023-09-14 – 2023-09-16 (×9): 3 mL via RESPIRATORY_TRACT
  Filled 2023-09-14 (×9): qty 3

## 2023-09-14 MED ORDER — ACETAMINOPHEN 325 MG PO TABS
650.0000 mg | ORAL_TABLET | Freq: Four times a day (QID) | ORAL | Status: DC | PRN
  Administered 2023-09-17: 650 mg via ORAL
  Filled 2023-09-14: qty 2

## 2023-09-14 MED ORDER — SODIUM CHLORIDE 0.9% FLUSH
3.0000 mL | Freq: Two times a day (BID) | INTRAVENOUS | Status: DC
Start: 2023-09-14 — End: 2023-09-18
  Administered 2023-09-14 – 2023-09-18 (×6): 3 mL via INTRAVENOUS

## 2023-09-14 MED ORDER — FLUTICASONE PROPIONATE 50 MCG/ACT NA SUSP
1.0000 | Freq: Two times a day (BID) | NASAL | Status: DC
Start: 2023-09-14 — End: 2023-09-18
  Administered 2023-09-15 – 2023-09-18 (×6): 1 via NASAL
  Filled 2023-09-14 (×2): qty 16

## 2023-09-14 MED ORDER — ONDANSETRON HCL 4 MG/2ML IJ SOLN: 4.0000 mg | Freq: Four times a day (QID) | INTRAMUSCULAR | Status: DC | PRN

## 2023-09-14 MED ORDER — POLYVINYL ALCOHOL 1.4 % OP SOLN: 1.0000 [drp] | Freq: Four times a day (QID) | OPHTHALMIC | Status: DC | PRN

## 2023-09-14 MED ORDER — APIXABAN 5 MG PO TABS
5.0000 mg | ORAL_TABLET | Freq: Two times a day (BID) | ORAL | Status: DC
  Administered 2023-09-15 – 2023-09-18 (×7): 5 mg via ORAL
  Filled 2023-09-14 (×8): qty 1

## 2023-09-14 MED ORDER — POTASSIUM CHLORIDE CRYS ER 10 MEQ PO TBCR
10.0000 meq | EXTENDED_RELEASE_TABLET | Freq: Every day | ORAL | Status: DC
  Administered 2023-09-15 – 2023-09-18 (×4): 10 meq via ORAL
  Filled 2023-09-14 (×7): qty 1

## 2023-09-14 MED ORDER — METHYLPREDNISOLONE SODIUM SUCC 125 MG IJ SOLR
60.0000 mg | Freq: Two times a day (BID) | INTRAMUSCULAR | Status: DC
  Administered 2023-09-14 – 2023-09-16 (×4): 60 mg via INTRAVENOUS
  Filled 2023-09-14 (×4): qty 2

## 2023-09-14 MED ORDER — SODIUM CHLORIDE 0.9 % IV SOLN
2.0000 g | INTRAVENOUS | Status: DC
  Administered 2023-09-14 – 2023-09-15 (×2): 2 g via INTRAVENOUS
  Filled 2023-09-14 (×2): qty 20

## 2023-09-14 MED ORDER — GABAPENTIN 100 MG PO CAPS
100.0000 mg | ORAL_CAPSULE | Freq: Every day | ORAL | Status: DC
  Administered 2023-09-15 – 2023-09-18 (×4): 100 mg via ORAL
  Filled 2023-09-14 (×4): qty 1

## 2023-09-14 MED ORDER — FUROSEMIDE 10 MG/ML IJ SOLN
40.0000 mg | Freq: Once | INTRAMUSCULAR | Status: AC
  Administered 2023-09-14: 40 mg via INTRAVENOUS
  Filled 2023-09-14: qty 4

## 2023-09-14 MED ORDER — FUROSEMIDE 10 MG/ML IJ SOLN
40.0000 mg | Freq: Two times a day (BID) | INTRAMUSCULAR | Status: DC
  Administered 2023-09-15 – 2023-09-16 (×3): 40 mg via INTRAVENOUS
  Filled 2023-09-14 (×3): qty 4

## 2023-09-14 MED ORDER — LORAZEPAM 2 MG/ML IJ SOLN
0.5000 mg | Freq: Once | INTRAMUSCULAR | Status: AC
  Administered 2023-09-14: 0.5 mg via INTRAVENOUS
  Filled 2023-09-14: qty 1

## 2023-09-14 MED ORDER — INSULIN ASPART 100 UNIT/ML IJ SOLN
0.0000 [IU] | Freq: Three times a day (TID) | INTRAMUSCULAR | Status: DC
  Administered 2023-09-15: 3 [IU] via SUBCUTANEOUS
  Administered 2023-09-15: 2 [IU] via SUBCUTANEOUS
  Administered 2023-09-16: 1 [IU] via SUBCUTANEOUS
  Administered 2023-09-16 – 2023-09-17 (×3): 3 [IU] via SUBCUTANEOUS
  Administered 2023-09-17: 2 [IU] via SUBCUTANEOUS
  Administered 2023-09-18: 3 [IU] via SUBCUTANEOUS

## 2023-09-14 MED ORDER — MORPHINE SULFATE (PF) 2 MG/ML IV SOLN
2.0000 mg | INTRAVENOUS | Status: DC | PRN
  Administered 2023-09-14: 2 mg via INTRAVENOUS
  Filled 2023-09-14: qty 1

## 2023-09-14 MED ORDER — SENNA 8.6 MG PO TABS
2.0000 | ORAL_TABLET | Freq: Every day | ORAL | Status: DC
  Administered 2023-09-15 – 2023-09-18 (×4): 17.2 mg via ORAL
  Filled 2023-09-14 (×5): qty 2

## 2023-09-14 MED ORDER — PROPYLENE GLYCOL 0.6 % OP SOLN: Freq: Four times a day (QID) | OPHTHALMIC | Status: DC | PRN

## 2023-09-14 MED ORDER — ORAL CARE MOUTH RINSE
15.0000 mL | OROMUCOSAL | Status: DC
  Administered 2023-09-14 – 2023-09-18 (×15): 15 mL via OROMUCOSAL

## 2023-09-14 MED ORDER — OXYBUTYNIN CHLORIDE ER 5 MG PO TB24
10.0000 mg | ORAL_TABLET | Freq: Every day | ORAL | Status: DC
  Administered 2023-09-15 – 2023-09-18 (×4): 10 mg via ORAL
  Filled 2023-09-14 (×4): qty 2

## 2023-09-14 MED ORDER — SODIUM CHLORIDE 0.9% FLUSH: 3.0000 mL | INTRAVENOUS | Status: DC | PRN

## 2023-09-14 MED ORDER — BISACODYL 5 MG PO TBEC
10.0000 mg | DELAYED_RELEASE_TABLET | Freq: Every day | ORAL | Status: DC | PRN
  Filled 2023-09-14: qty 2

## 2023-09-14 MED ORDER — ORAL CARE MOUTH RINSE: 15.0000 mL | OROMUCOSAL | Status: DC | PRN

## 2023-09-14 MED ORDER — METHYLPREDNISOLONE SODIUM SUCC 125 MG IJ SOLR
125.0000 mg | Freq: Once | INTRAMUSCULAR | Status: AC
  Administered 2023-09-14: 125 mg via INTRAVENOUS
  Filled 2023-09-14: qty 2

## 2023-09-14 MED ORDER — HYDRALAZINE HCL 20 MG/ML IJ SOLN
10.0000 mg | Freq: Four times a day (QID) | INTRAMUSCULAR | Status: DC | PRN
  Administered 2023-09-14 – 2023-09-16 (×4): 10 mg via INTRAVENOUS
  Filled 2023-09-14 (×4): qty 1

## 2023-09-14 MED ORDER — CHLORHEXIDINE GLUCONATE CLOTH 2 % EX PADS
6.0000 | MEDICATED_PAD | Freq: Every day | CUTANEOUS | Status: DC
  Administered 2023-09-14 – 2023-09-18 (×5): 6 via TOPICAL

## 2023-09-14 MED ORDER — ARFORMOTEROL TARTRATE 15 MCG/2ML IN NEBU
15.0000 ug | INHALATION_SOLUTION | Freq: Two times a day (BID) | RESPIRATORY_TRACT | Status: DC
  Administered 2023-09-14 – 2023-09-18 (×8): 15 ug via RESPIRATORY_TRACT
  Filled 2023-09-14 (×8): qty 2

## 2023-09-14 MED ORDER — DONEPEZIL HCL 5 MG PO TABS
5.0000 mg | ORAL_TABLET | Freq: Every day | ORAL | Status: DC
  Administered 2023-09-15 – 2023-09-17 (×3): 5 mg via ORAL
  Filled 2023-09-14 (×5): qty 1

## 2023-09-14 MED ORDER — POLYETHYLENE GLYCOL 3350 17 G PO PACK
17.0000 g | PACK | Freq: Every day | ORAL | Status: DC | PRN
  Administered 2023-09-17: 17 g via ORAL
  Filled 2023-09-14: qty 1

## 2023-09-14 MED ORDER — LIDOCAINE 5 % EX PTCH
1.0000 | MEDICATED_PATCH | Freq: Every day | CUTANEOUS | Status: DC
  Administered 2023-09-14 – 2023-09-17 (×4): 1 via TRANSDERMAL
  Filled 2023-09-14 (×4): qty 1

## 2023-09-14 MED ORDER — ACETAMINOPHEN 650 MG RE SUPP
650.0000 mg | Freq: Four times a day (QID) | RECTAL | Status: DC | PRN
Start: 2023-09-14 — End: 2023-09-18

## 2023-09-14 MED ORDER — IPRATROPIUM-ALBUTEROL 0.5-2.5 (3) MG/3ML IN SOLN
3.0000 mL | Freq: Once | RESPIRATORY_TRACT | Status: AC
  Administered 2023-09-14: 3 mL via RESPIRATORY_TRACT
  Filled 2023-09-14: qty 3

## 2023-09-14 MED ORDER — SODIUM CHLORIDE 0.9% FLUSH
3.0000 mL | Freq: Two times a day (BID) | INTRAVENOUS | Status: DC
  Administered 2023-09-14 – 2023-09-18 (×8): 3 mL via INTRAVENOUS

## 2023-09-14 MED ORDER — LOSARTAN POTASSIUM 50 MG PO TABS
100.0000 mg | ORAL_TABLET | Freq: Every day | ORAL | Status: DC
  Administered 2023-09-15 – 2023-09-18 (×4): 100 mg via ORAL
  Filled 2023-09-14 (×4): qty 2

## 2023-09-14 MED ORDER — PREDNISONE 20 MG PO TABS: 40.0000 mg | ORAL_TABLET | Freq: Every day | ORAL | Status: DC

## 2023-09-14 MED ORDER — ROSUVASTATIN CALCIUM 5 MG PO TABS
5.0000 mg | ORAL_TABLET | Freq: Every day | ORAL | Status: DC
Start: 2023-09-15 — End: 2023-09-18
  Administered 2023-09-15 – 2023-09-18 (×4): 5 mg via ORAL
  Filled 2023-09-14 (×4): qty 1

## 2023-09-14 NOTE — Progress Notes (Signed)
       Overnight   NAME: QUYNH BASSO MRN: [id number] DOB : 1941-03-31    Date of Service   09/14/2023   HPI/Events of Note    Notified by RN for agitation and constantly removing BiPAP  Obvious anxiety at bedside    Interventions/ Plan   Ordered Single does anti-anxiolytic Resume all Attending orders      Denece Finger BSN MSNA MSN ACNPC-AG Acute Care Nurse Practitioner [facility]

## 2023-09-14 NOTE — ED Triage Notes (Signed)
 BIBA from [facility] for "not acting like themselves" Hx of pnemonia, dementia, UTIs

## 2023-09-14 NOTE — ED Provider Notes (Signed)
 [place] [facility]MENT AT [facility] Provider Note   CSN: [id number] Arrival date & time: 09/14/23  1150     History  Chief Complaint  Patient presents with   Altered Mental Status    Elizabeth [name] is a 83 y.o. female.   Altered Mental Status Presenting symptoms: confusion   Patient presents for altered mental status.  Medical history includes CKD, DM, HTN, asthma, GERD, OSA, CHF.  She arrives via EMS.  Per facility, she is not acting like her normal self.  Patient states that she has no physical complaints at this time.  When asked why the facility called EMS, she states that "because they are a bunch of liars".     Home Medications Prior to Admission medications   Medication Sig Start Date End Date Taking? Authorizing Provider  acetaminophen  (TYLENOL ) 325 MG tablet Take 2 tablets (650 mg total) by mouth every 6 (six) hours as needed for mild pain or headache (fever >/= 101). 05/14/21   Regalado, Belkys A, MD  albuterol  (PROVENTIL  HFA;VENTOLIN  HFA) 108 (90 BASE) MCG/ACT inhaler Inhale 2 puffs into the lungs every 6 (six) hours as needed. For bronchitis    [provider]  apixaban  (ELIQUIS ) 5 MG TABS tablet Take 1 tablet (5 mg total) by mouth 2 (two) times daily. 04/28/23   Amin, Ankit C, MD  Azelastine  HCl 0.15 % SOLN PLACE 1 SPRAY INTO THE NOSE 2 (TWO) TIMES DAILY. 08/23/22   Sood, Vineet, MD  bisacodyl  (DULCOLAX) 5 MG EC tablet Take 2 tablets (10 mg total) by mouth daily as needed for moderate constipation or severe constipation. 04/28/23   Amin, Ankit C, MD  budesonide-formoterol  (SYMBICORT) 160-4.5 MCG/ACT inhaler Inhale 2 puffs into the lungs 2 (two) times daily.    [provider]  Cetirizine HCl (ZYRTEC PO) Take by mouth daily.    [provider]  cyanocobalamin  (VITAMIN B12) 1000 MCG tablet Take 1,000 mcg by mouth daily.    [provider]  diclofenac Sodium (VOLTAREN) 1 % GEL Apply 2 g topically 4 (four) times daily.  04/24/21   [provider]  donepezil  (ARICEPT ) 5 MG tablet Take 1 tablet (5 mg total) by mouth daily. 07/30/23   Wertman, Sara E, PA-C  fluticasone  (FLONASE ) 50 MCG/ACT nasal spray PLACE 1 SPRAY INTO BOTH NOSTRILS 2 (TWO) TIMES DAILY 09/13/22   Sood, Vineet, MD  gabapentin (NEURONTIN) 100 MG capsule Take 100 mg by mouth daily. 03/27/23   [provider]  guaiFENesin  (MUCINEX ) 600 MG 12 hr tablet Take 600 mg by mouth 2 (two) times daily. For 5 days starting 04/20/2023    [provider]  hydrALAZINE  (APRESOLINE ) 50 MG tablet Take 25 mg by mouth every 8 (eight) hours as needed (hypertension). If BP consistently systolic (top number) stays above 140. 04/10/23   [provider]  JARDIANCE 10 MG TABS tablet Take 10 mg by mouth daily. 04/09/23   [provider]  lidocaine  (LIDODERM ) 5 % Place 1 patch onto the skin at bedtime. 03/13/23   [provider]  losartan  (COZAAR ) 100 MG tablet Take 100 mg by mouth daily.    [provider]  metFORMIN (GLUCOPHAGE) 500 MG tablet Take 500 mg by mouth 2 (two) times daily with a meal.    [provider]  Multiple Vitamins-Minerals (CENTRUM SILVER PO) Take 1 tablet by mouth.    [provider]  oxybutynin  (DITROPAN -XL) 10 MG 24 hr tablet Take 10 mg by mouth daily. 12/09/22  [provider]  oxyCODONE  (OXY IR/ROXICODONE ) 5 MG immediate release tablet Take 1 tablet (5 mg total) by mouth every 6 (six) hours as needed for moderate pain (pain score 4-6) or severe pain (pain score 7-10). 04/28/23   Amin, Ankit C, MD  pantoprazole  (PROTONIX ) 40 MG tablet Take 40 mg by mouth daily.    [provider]  potassium chloride (KLOR-CON) 10 MEQ tablet Take 20 mEq by mouth daily.    [provider]  Propylene Glycol (SYSTANE COMPLETE OP) Place 1 drop into both eyes 4 (four) times daily as needed (itching).    [provider]  rosuvastatin  (CRESTOR ) 5 MG tablet Take 5 mg by mouth  daily. 12/09/22   [provider]  senna (SENOKOT) 8.6 MG tablet Take 2 tablets by mouth daily.    [provider]      Allergies    Patient has no known allergies.    Review of Systems   Review of Systems  Constitutional:  Positive for activity change and fatigue.  Psychiatric/Behavioral:  Positive for confusion.   All other systems reviewed and are negative.   Physical Exam Updated Vital Signs BP (!) 175/86   Pulse 69   Resp 18   SpO2 100%  Physical Exam Vitals and nursing note reviewed.  Constitutional:      General: She is not in acute distress.    Appearance: Normal appearance. She is well-developed. She is obese. She is ill-appearing. She is not toxic-appearing or diaphoretic.  HENT:     Head: Normocephalic and atraumatic.     Right Ear: External ear normal.     Left Ear: External ear normal.     Nose: Nose normal.     Mouth/Throat:     Mouth: Mucous membranes are moist.  Eyes:     Extraocular Movements: Extraocular movements intact.     Conjunctiva/sclera: Conjunctivae normal.  Cardiovascular:     Rate and Rhythm: Normal rate and regular rhythm.     Heart sounds: No murmur heard. Pulmonary:     Effort: Pulmonary effort is normal. No respiratory distress.     Breath sounds: Decreased breath sounds and rales present. No wheezing or rhonchi.  Chest:     Chest wall: No tenderness.  Abdominal:     General: There is no distension.     Palpations: Abdomen is soft.     Tenderness: There is no abdominal tenderness.  Musculoskeletal:        General: No swelling.     Cervical back: Normal range of motion and neck supple.     Right lower leg: Edema present.     Left lower leg: Edema present.  Skin:    General: Skin is warm and dry.     Coloration: Skin is not jaundiced or pale.  Neurological:     General: No focal deficit present.     Mental Status: She is alert and oriented to person, place, and time.  Psychiatric:        Mood and Affect: Mood  normal.        Behavior: Behavior normal.     ED Results / Procedures / Treatments   Labs (all labs ordered are listed, but only abnormal results are displayed) Labs Reviewed  COMPREHENSIVE METABOLIC PANEL WITH GFR - Abnormal; Notable for the following components:      Result Value   Chloride 91 (*)    CO2 37 (*)    Albumin 2.8 (*)    AST  13 (*)    All other components within normal limits  CBC WITH DIFFERENTIAL/PLATELET - Abnormal; Notable for the following components:   WBC 3.2 (*)    RBC 3.43 (*)    Hemoglobin 9.9 (*)    MCV 105.5 (*)    MCHC 27.3 (*)    All other components within normal limits  BRAIN NATRIURETIC PEPTIDE - Abnormal; Notable for the following components:   B Natriuretic Peptide 328.1 (*)    All other components within normal limits  BLOOD GAS, VENOUS - Abnormal; Notable for the following components:   pH, Ven 7.24 (*)    pCO2, Ven 120 (*)    pO2, Ven 58 (*)    Bicarbonate 51.4 (*)    Acid-Base Excess 18.0 (*)    All other components within normal limits  URINALYSIS, W/ REFLEX TO CULTURE (INFECTION SUSPECTED) - Abnormal; Notable for the following components:   Glucose, UA >=500 (*)    Protein, ur >=300 (*)    All other components within normal limits  TROPONIN I (HIGH SENSITIVITY) - Abnormal; Notable for the following components:   Troponin I (High Sensitivity) 29 (*)    All other components within normal limits  CULTURE, BLOOD (ROUTINE X 2)  CULTURE, BLOOD (ROUTINE X 2)  MAGNESIUM   I-STAT CG4 LACTIC ACID, ED  TROPONIN I (HIGH SENSITIVITY)    EKG EKG Interpretation Date/Time:  Sunday Sep 14 2023 12:00:45 EDT Ventricular Rate:  77 PR Interval:  151 QRS Duration:  132 QT Interval:  397 QTC Calculation: 450 R Axis:   270  Text Interpretation: Sinus rhythm RBBB and LAFB Probable left ventricular hypertrophy Confirmed by Iva Mariner ([id number]) on 09/14/2023 12:18:00 PM  Radiology CT CHEST ABDOMEN PELVIS WO CONTRAST Result Date: 09/14/2023 CLINICAL  DATA:  Sepsis EXAM: CT CHEST, ABDOMEN AND PELVIS WITHOUT CONTRAST TECHNIQUE: Multidetector CT imaging of the chest, abdomen and pelvis was performed following the standard protocol without IV contrast. RADIATION DOSE REDUCTION: This exam was performed according to the departmental dose-optimization program which includes automated exposure control, adjustment of the mA and/or kV according to patient size and/or use of iterative reconstruction technique. COMPARISON:  04/23/2023 FINDINGS: CT CHEST FINDINGS Cardiovascular: Heart size normal. No pericardial effusion. Dilated central pulmonary arteries, suggesting pulmonary hypertension. Scattered coronary and aortic calcified plaque. No TAA. Mediastinum/Nodes: No mediastinal hematoma, mass, or adenopathy. Lungs/Pleura: Moderate pleural effusions, slightly increased on the left since prior study. Dependent atelectasis posteriorly, left worse than right. Musculoskeletal: Vertebral endplate spurring at multiple levels in the mid and lower thoracic spine. Advanced bilateral shoulder DJD. CT ABDOMEN PELVIS FINDINGS Hepatobiliary: Subcapsular cystic lesion with peripheral coarse calcification in segment 8 is stable. No new liver lesion or biliary ductal dilatation. Small partially calcified gallstones layer in the dependent aspect of the nondilated gallbladder. Pancreas: Unremarkable. No pancreatic ductal dilatation or surrounding inflammatory changes. Spleen: Normal in size without focal abnormality. Adrenals/Urinary Tract: 1.3 cm right adrenal nodule, previously 1.2 cm. Symmetric renal contours without hydronephrosis. Bilateral urolithiasis, largest stone peripherally on the left in the lower pole 9 mm, on the right 5 mm in the lower pole. Urinary bladder nondistended. Stomach/Bowel: Stomach is nondistended. Small bowel decompressed. Normal appendix. The colon is nondistended, with scattered diverticula most numerous in the distal descending and proximal sigmoid segments,  with regional wall thickening but no significant adjacent inflammatory change. Vascular/Lymphatic: Mild scattered aortoiliac calcified plaque without AAA. No abdominal or pelvic adenopathy. Reproductive: Status post hysterectomy. No adnexal masses. Other: Pelvic phleboliths.  No ascites.  No free air. Musculoskeletal: Small paraumbilical hernia containing only mesenteric fat. Multilevel lumbar spondylitic change with stable grade 1 anterolisthesis L4-5. Hip DJD worse left than right. IMPRESSION: 1. Moderate pleural effusions, slightly increased on the left since prior study. 2. Bilateral nephrolithiasis without hydronephrosis. 3. Colonic diverticulosis. 4. Cholelithiasis. 5. 1.3 cm right adrenal nodule, previously 1.2 cm. 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 Consider 12 month follow-up adrenal protocol CT. 6. Coronary and aortic Atherosclerosis (ICD10-I70.0). Electronically Signed   By: Nicoletta Barrier M.D.   On: 09/14/2023 14:19   CT Head Wo Contrast Result Date: 09/14/2023 CLINICAL DATA:  Mental status change, unknown cause, dementia EXAM: CT HEAD WITHOUT CONTRAST TECHNIQUE: Contiguous axial images were obtained from the base of the skull through the vertex without intravenous contrast. RADIATION DOSE REDUCTION: This exam was performed according to the departmental dose-optimization program which includes automated exposure control, adjustment of the mA and/or kV according to patient size and/or use of iterative reconstruction technique. COMPARISON:  10/14/2022, 08/06/2022 FINDINGS: Brain: Minor periventricular white matter microvascular ischemic changes bilaterally. No acute intracranial hemorrhage new infarction, mass lesion, midline shift, herniation, hydrocephalus, or extra-axial fluid collection. No focal mass effect or edema. Cisterns are patent. No cerebellar abnormality. Vascular: No hyperdense vessel or unexpected calcification. Skull: No acute osseous finding fracture. Left mastoid  effusion noted. Right mastoid is clear. Sinuses/Orbits: Chronic complete opacification of the right maxillary sinus with surrounding maxillary wall hyperostosis as well as partially calcified soft tissue protrusion into the nasal cavity. No orbital abnormality. Other: None. IMPRESSION: 1. No acute intracranial abnormality by noncontrast CT. 2. Minor white matter microvascular ischemic changes. 3. 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 4. Left mastoid effusion. Electronically Signed   By: Melven Stable.  Shick M.D.   On: 09/14/2023 14:19   DG Chest Port 1 View Result Date: 09/14/2023 CLINICAL DATA:  Questionable sepsis. EXAM: PORTABLE CHEST 1 VIEW COMPARISON:  05/12/2021 FINDINGS: The cardiomediastinal contours are unremarkable. There is asymmetric veil like opacification over the left mid and left lower lung. Mild thickening of the minor fissure within the right midlung is noted with blunting of the right costophrenic angle. No airspace consolidation identified. IMPRESSION: 1. Asymmetric veil like opacification over the left mid and left lower lung compatible with layering pleural effusion. 2. Blunting of the right costophrenic angle compatible with small right pleural effusion. Electronically Signed   By: Kimberley Penman M.D.   On: 09/14/2023 13:55    Procedures Procedures    Medications Ordered in ED Medications  furosemide (LASIX) injection 40 mg (has no administration in time range)  ipratropium-albuterol  (DUONEB) 0.5-2.5 (3) MG/3ML nebulizer solution 3 mL (3 mLs Nebulization Given 09/14/23 1457)  methylPREDNISolone  sodium succinate (SOLU-MEDROL ) 125 mg/2 mL injection 125 mg (125 mg Intravenous Given 09/14/23 1458)    ED Course/ Medical Decision Making/ A&P                                 Medical Decision Making Amount and/or Complexity of Data Reviewed Labs: ordered. Radiology:  ordered.  Risk Prescription drug management.   This patient presents to the ED for concern of altered mental status, this involves an extensive number of treatment options, and is a complaint that carries with it a high risk of complications and morbidity.  The differential diagnosis includes infection, metabolic derangements, polypharmacy, progression of neurocognitive disease,  CVA, seizure   Co morbidities / Chronic conditions that complicate the patient evaluation  CKD, DM, HTN, asthma, GERD, OSA, CHF   Additional history obtained:  Additional history obtained from EMR External records from outside source obtained and reviewed including N/A   Lab Tests:  I Ordered, and personally interpreted labs.  The pertinent results include: Normal kidney function, normal electrolytes.  BNP is elevated consistent with CHF exacerbation.  Respiratory acidosis is present consistent with possible reactive airway disease and/or obesity hypoventilation.  Anemia and leukopenia are baseline.   Imaging Studies ordered:  I ordered imaging studies including x-ray, CT head, CT of chest, abdomen, pelvis I independently visualized and interpreted imaging which showed chronic right maxillary disease; moderate pleural effusions I agree with the radiologist interpretation   Cardiac Monitoring: / EKG:  The patient was maintained on a cardiac monitor.  I personally viewed and interpreted the cardiac monitored which showed an underlying rhythm of: Sinus rhythm   Problem List / ED Course / Critical interventions / Medication management  Patient presents from skilled nursing facility for concern of altered mental status.  Despite history of dementia, she is currently alert and oriented and able to answer questions appropriately.  She denies any physical complaints.  She arrives on 2 L of supplemental oxygen .  She states that this is her baseline.  She is currently mildly tachypneic but able to speak in  complete sentences.  She has diminished breath sounds on lung auscultation.  Abdomen is soft and nontender.  Workup was initiated.  Workup notable for respiratory acidosis on blood gas.  This would explain concern of altered mental status.  Patient was started on BiPAP.  Although she does not have documented history of reactive airway disease, she does have diminished breath sounds on lung auscultation.  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  Patient's imaging studies notable for pleural effusions.  Her BNP is elevated.  Will treat for hypervolemia.  Patient to be admitted for further management. I ordered medication including Solu-Medrol  and DuoNeb for possible reactive airway disease; Lasix for diuresis Reevaluation of the patient after these medicines showed that the patient improved I have reviewed the patients home medicines and have made adjustments as needed   Social Determinants of Health:  Resides in nursing facility  CRITICAL CARE Performed by: Iva Mariner   Total critical care time: 32 minutes  Critical care time was exclusive of separately billable procedures and treating other patients.  Critical care was necessary to treat or prevent imminent or life-threatening deterioration.  Critical care was time spent personally by me on the following activities: development of treatment plan with patient and/or surrogate as well as nursing, discussions with consultants, evaluation of patient's response to treatment, examination of patient, obtaining history from patient or surrogate, ordering and performing treatments and interventions, ordering and review of laboratory studies, ordering and review of radiographic studies, pulse oximetry and re-evaluation of patient's condition.         Final Clinical Impression(s) / ED Diagnoses Final diagnoses:  Acute on chronic respiratory failure with hypoxia and hypercapnia [facility])    Rx / DC Orders ED Discharge Orders      None         Iva Mariner, MD 09/14/23 1554

## 2023-09-14 NOTE — Progress Notes (Signed)
   09/14/23 1700  BiPAP/CPAP/SIPAP  BiPAP/CPAP/SIPAP Pt Type (S)   (Transported from ER # [id number] to ICU #[id number] on [employer]: full E cylinder. Safe transport noted.)  BiPAP/CPAP /SiPAP Vitals  Temp 98 F (36.7 C)  Pulse Rate 69  BP (!) 136/120  SpO2 100 %  MEWS Score/Color  MEWS Score 1  MEWS Score Color Green

## 2023-09-14 NOTE — H&P (Signed)
 History and Physical    Elizabeth [name] ACZ:[id number] DOB: 1941-04-22 DOA: 09/14/2023  PCP: Shari Daughters, MD  Patient coming from: Nursing facility  I have personally briefly reviewed patient's old medical records in [facility]  Chief Complaint: Altered mental status/confusion/lethargy  HPI: Elizabeth [name] is a 83 y.o. female with medical history significant of paroxysmal A-fib on chronic anticoagulation, chronic HFpEF, hypertension, hyperlipidemia, type 2 diabetes, OSA, GERD, CKD, memory impairment who presents to the ED with altered mental status/confusion.  History obtained per ED physician's note and per daughter and in speaking with the patient.  Per daughter she was told by patient's roommate that patient has had confusion, lethargy, decreased interaction, decreased oral intake x 1 week.  Per ED physician it is noted that patient was not acting like her normal self and subsequently sent to the ED via EMS.  Patient denies any fever, no chills, no chest pain, no shortness of breath, no abdominal pain, no diarrhea, no constipation, no dysuria, no melena, no hematemesis, no hematochezia.  Patient does endorse some constipation.  Patient denies any syncopal episode. At time of interview patient had just removed the BiPAP, was more interactive and answering and following questions.  Patient noted to be alert to self and place only.  ED Course: Patient seen in the ED, urinalysis done nitrite negative leukocytes negative greater than 300 protein, 0-5 WBCs.  Comprehensive metabolic profile done with a chloride of 91, bicarb of 37, albumin of 2.8 otherwise within normal limits.  BNP noted at 328.1.  Initial troponin of 29.  Lactic acid level of 0.6.  VBG with a pH of 7.24/PCO2 of 120/PO2 of 58/bicarb of 52.  CBC with a white count of 3.2, hemoglobin of 9.9, platelet count of 171 otherwise within normal limits.  CT head with no acute intracranial abnormality by noncontrast CT.  Minor white  matter microvascular ischemic changes noted.  Stable chronic right maxillary sinus disease with soft tissue protrusion in the nasal cavity, potentially reflecting a chronic mycetoma.  Left mastoid effusion.  Chest x-ray done with asymmetric failure-like opacification over the left mid and left lower lung compatible with layering pleural effusion.  Blunting of right costophrenic angle compatible with small right pleural effusion.  Patient placed on BiPAP, given Lasix 40 mg IV x 1 with clinical improvement.  Review of Systems: As per HPI otherwise all other systems reviewed and are negative.  Past Medical History:  Diagnosis Date   (HFpEF) heart failure with preserved ejection fraction (HCC) 12/24/2022   Arthritis    Asthma    Breast mass in female    Bronchitis    Bronchitis    Constipation    Cough    GERD (gastroesophageal reflux disease)    Hypertension associated with diabetes (HCC)    Type 2 diabetes mellitus (HCC)    Wheezing     Past Surgical History:  Procedure Laterality Date   ABDOMINAL HYSTERECTOMY  1996   CYSTECTOMY  1980's   back of neck   THYROID  SURGERY  2001    Social History  reports that she quit smoking about 59 years ago. Her smoking use included cigarettes. She has never used smokeless tobacco. She reports that she does not drink alcohol and does not use drugs.  No Known Allergies  Family History  Problem Relation Age of Onset   Diabetes Mother    Heart disease Mother    Cancer Maternal Uncle     Prior to Admission medications  Medication Sig Start Date End Date Taking? Authorizing Provider  acetaminophen  (TYLENOL ) 325 MG tablet Take 2 tablets (650 mg total) by mouth every 6 (six) hours as needed for mild pain or headache (fever >/= 101). 05/14/21   Regalado, Belkys A, MD  albuterol  (PROVENTIL  HFA;VENTOLIN  HFA) 108 (90 BASE) MCG/ACT inhaler Inhale 2 puffs into the lungs every 6 (six) hours as needed. For bronchitis    [provider]  apixaban   (ELIQUIS ) 5 MG TABS tablet Take 1 tablet (5 mg total) by mouth 2 (two) times daily. 04/28/23   Amin, Ankit C, MD  Azelastine  HCl 0.15 % SOLN PLACE 1 SPRAY INTO THE NOSE 2 (TWO) TIMES DAILY. 08/23/22   Sood, Vineet, MD  bisacodyl  (DULCOLAX) 5 MG EC tablet Take 2 tablets (10 mg total) by mouth daily as needed for moderate constipation or severe constipation. 04/28/23   Amin, Ankit C, MD  budesonide-formoterol  (SYMBICORT) 160-4.5 MCG/ACT inhaler Inhale 2 puffs into the lungs 2 (two) times daily.    [provider]  Cetirizine HCl (ZYRTEC PO) Take by mouth daily.    [provider]  cyanocobalamin  (VITAMIN B12) 1000 MCG tablet Take 1,000 mcg by mouth daily.    [provider]  diclofenac Sodium (VOLTAREN) 1 % GEL Apply 2 g topically 4 (four) times daily. 04/24/21   [provider]  donepezil  (ARICEPT ) 5 MG tablet Take 1 tablet (5 mg total) by mouth daily. 07/30/23   Wertman, Sara E, PA-C  fluticasone  (FLONASE ) 50 MCG/ACT nasal spray PLACE 1 SPRAY INTO BOTH NOSTRILS 2 (TWO) TIMES DAILY 09/13/22   Sood, Vineet, MD  gabapentin (NEURONTIN) 100 MG capsule Take 100 mg by mouth daily. 03/27/23   [provider]  guaiFENesin  (MUCINEX ) 600 MG 12 hr tablet Take 600 mg by mouth 2 (two) times daily. For 5 days starting 04/20/2023    [provider]  hydrALAZINE  (APRESOLINE ) 50 MG tablet Take 25 mg by mouth every 8 (eight) hours as needed (hypertension). If BP consistently systolic (top number) stays above 140. 04/10/23   [provider]  JARDIANCE 10 MG TABS tablet Take 10 mg by mouth daily. 04/09/23   [provider]  lidocaine  (LIDODERM ) 5 % Place 1 patch onto the skin at bedtime. 03/13/23   [provider]  losartan  (COZAAR ) 100 MG tablet Take 100 mg by mouth daily.    [provider]  metFORMIN (GLUCOPHAGE) 500 MG tablet Take 500 mg by mouth 2 (two) times daily with a meal.    [provider]  Multiple Vitamins-Minerals  (CENTRUM SILVER PO) Take 1 tablet by mouth.    [provider]  oxybutynin  (DITROPAN -XL) 10 MG 24 hr tablet Take 10 mg by mouth daily. 12/09/22   [provider]  oxyCODONE  (OXY IR/ROXICODONE ) 5 MG immediate release tablet Take 1 tablet (5 mg total) by mouth every 6 (six) hours as needed for moderate pain (pain score 4-6) or severe pain (pain score 7-10). 04/28/23   Amin, Ankit C, MD  pantoprazole  (PROTONIX ) 40 MG tablet Take 40 mg by mouth daily.    [provider]  potassium chloride (KLOR-CON) 10 MEQ tablet Take 20 mEq by mouth daily.    [provider]  Propylene Glycol (SYSTANE COMPLETE OP) Place 1 drop into both eyes 4 (four) times daily as needed (itching).    [provider]  rosuvastatin  (CRESTOR ) 5 MG tablet Take 5 mg by mouth daily. 12/09/22   [provider]  senna (SENOKOT) 8.6 MG tablet  Take 2 tablets by mouth daily.    [provider]    Physical Exam: Vitals:   09/14/23 1514 09/14/23 1640 09/14/23 1650 09/14/23 1700  BP:  (!) 187/90  (!) 136/120  Pulse: 69 80 69 69  Resp: 18  (!) 21   Temp:    98 F (36.7 C)  TempSrc:    Axillary  SpO2: 100% 99% 100% 100%    Constitutional: NAD, calm, comfortable Vitals:   09/14/23 1514 09/14/23 1640 09/14/23 1650 09/14/23 1700  BP:  (!) 187/90  (!) 136/120  Pulse: 69 80 69 69  Resp: 18  (!) 21   Temp:    98 F (36.7 C)  TempSrc:    Axillary  SpO2: 100% 99% 100% 100%   Eyes: PERRL, lids and conjunctivae normal ENMT: Mucous membranes are moist. Posterior pharynx clear of any exudate or lesions.Normal dentition.  Neck: normal, supple, no masses, no thyromegaly Respiratory: Bibasilar crackles.  No wheezing.  Fair air movement.  Speaking in full sentences.  No use of accessory muscles of respiration.  Cardiovascular: Regular rate and rhythm, no murmurs / rubs / gallops. No extremity edema. 2+ pedal pulses. No carotid bruits.  Abdomen: no tenderness, no masses palpated. No  hepatosplenomegaly. Bowel sounds positive.  Musculoskeletal: no clubbing / cyanosis. No joint deformity upper and lower extremities. Good ROM, no contractures. Normal muscle tone.  Skin: no rashes, lesions, ulcers. No induration Neurologic: CN 2-12 grossly intact. Sensation intact, DTR normal. Strength 5/5 in all 4.  Psychiatric: Poor to fair judgment and insight.  Alert and oriented x 2.  Normal mood.  Labs on Admission: I have personally reviewed following labs and imaging studies  CBC: Recent Labs  Lab 09/14/23 1300  WBC 3.2*  NEUTROABS 2.1  HGB 9.9*  HCT 36.2  MCV 105.5*  PLT 171    Basic Metabolic Panel: Recent Labs  Lab 09/14/23 1300  NA 141  K 4.9  CL 91*  CO2 37*  GLUCOSE 84  BUN 12  CREATININE 0.48  CALCIUM  9.3  MG 1.7    GFR: CrCl cannot be calculated (Unknown ideal weight.).  Liver Function Tests: Recent Labs  Lab 09/14/23 1300  AST 13*  ALT 8  ALKPHOS 51  BILITOT 0.5  PROT 6.5  ALBUMIN 2.8*    Urine analysis:    Component Value Date/Time   COLORURINE YELLOW 09/14/2023 1336   APPEARANCEUR CLEAR 09/14/2023 1336   LABSPEC 1.014 09/14/2023 1336   PHURINE 5.0 09/14/2023 1336   GLUCOSEU >=500 (A) 09/14/2023 1336   HGBUR NEGATIVE 09/14/2023 1336   BILIRUBINUR NEGATIVE 09/14/2023 1336   KETONESUR NEGATIVE 09/14/2023 1336   PROTEINUR >=300 (A) 09/14/2023 1336   UROBILINOGEN 1.0 10/05/2009 1218   NITRITE NEGATIVE 09/14/2023 1336   LEUKOCYTESUR NEGATIVE 09/14/2023 1336    Radiological Exams on Admission: CT CHEST ABDOMEN PELVIS WO CONTRAST Result Date: 09/14/2023 CLINICAL DATA:  Sepsis EXAM: CT CHEST, ABDOMEN AND PELVIS WITHOUT CONTRAST TECHNIQUE: Multidetector CT imaging of the chest, abdomen and pelvis was performed following the standard protocol without IV contrast. RADIATION DOSE REDUCTION: This exam was performed according to the departmental dose-optimization program which includes automated exposure control, adjustment of the mA and/or kV  according to patient size and/or use of iterative reconstruction technique. COMPARISON:  04/23/2023 FINDINGS: CT CHEST FINDINGS Cardiovascular: Heart size normal. No pericardial effusion. Dilated central pulmonary arteries, suggesting pulmonary hypertension. Scattered coronary and aortic calcified plaque. No TAA. Mediastinum/Nodes: No mediastinal hematoma, mass, or adenopathy. Lungs/Pleura: Moderate pleural effusions,  slightly increased on the left since prior study. Dependent atelectasis posteriorly, left worse than right. Musculoskeletal: Vertebral endplate spurring at multiple levels in the mid and lower thoracic spine. Advanced bilateral shoulder DJD. CT ABDOMEN PELVIS FINDINGS Hepatobiliary: Subcapsular cystic lesion with peripheral coarse calcification in segment 8 is stable. No new liver lesion or biliary ductal dilatation. Small partially calcified gallstones layer in the dependent aspect of the nondilated gallbladder. Pancreas: Unremarkable. No pancreatic ductal dilatation or surrounding inflammatory changes. Spleen: Normal in size without focal abnormality. Adrenals/Urinary Tract: 1.3 cm right adrenal nodule, previously 1.2 cm. Symmetric renal contours without hydronephrosis. Bilateral urolithiasis, largest stone peripherally on the left in the lower pole 9 mm, on the right 5 mm in the lower pole. Urinary bladder nondistended. Stomach/Bowel: Stomach is nondistended. Small bowel decompressed. Normal appendix. The colon is nondistended, with scattered diverticula most numerous in the distal descending and proximal sigmoid segments, with regional wall thickening but no significant adjacent inflammatory change. Vascular/Lymphatic: Mild scattered aortoiliac calcified plaque without AAA. No abdominal or pelvic adenopathy. Reproductive: Status post hysterectomy. No adnexal masses. Other: Pelvic phleboliths.  No ascites.  No free air. Musculoskeletal: Small paraumbilical hernia containing only mesenteric fat.  Multilevel lumbar spondylitic change with stable grade 1 anterolisthesis L4-5. Hip DJD worse left than right. IMPRESSION: 1. Moderate pleural effusions, slightly increased on the left since prior study. 2. Bilateral nephrolithiasis without hydronephrosis. 3. Colonic diverticulosis. 4. Cholelithiasis. 5. 1.3 cm right adrenal nodule, previously 1.2 cm. 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 Consider 12 month follow-up adrenal protocol CT. 6. Coronary and aortic Atherosclerosis (ICD10-I70.0). Electronically Signed   By: Nicoletta Barrier M.D.   On: 09/14/2023 14:19   CT Head Wo Contrast Result Date: 09/14/2023 CLINICAL DATA:  Mental status change, unknown cause, dementia EXAM: CT HEAD WITHOUT CONTRAST TECHNIQUE: Contiguous axial images were obtained from the base of the skull through the vertex without intravenous contrast. RADIATION DOSE REDUCTION: This exam was performed according to the departmental dose-optimization program which includes automated exposure control, adjustment of the mA and/or kV according to patient size and/or use of iterative reconstruction technique. COMPARISON:  10/14/2022, 08/06/2022 FINDINGS: Brain: Minor periventricular white matter microvascular ischemic changes bilaterally. No acute intracranial hemorrhage new infarction, mass lesion, midline shift, herniation, hydrocephalus, or extra-axial fluid collection. No focal mass effect or edema. Cisterns are patent. No cerebellar abnormality. Vascular: No hyperdense vessel or unexpected calcification. Skull: No acute osseous finding fracture. Left mastoid effusion noted. Right mastoid is clear. Sinuses/Orbits: Chronic complete opacification of the right maxillary sinus with surrounding maxillary wall hyperostosis as well as partially calcified soft tissue protrusion into the nasal cavity. No orbital abnormality. Other: None. IMPRESSION: 1. No acute intracranial abnormality by noncontrast CT. 2. Minor white matter microvascular  ischemic changes. 3. 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 4. Left mastoid effusion. Electronically Signed   By: Melven Stable.  Shick M.D.   On: 09/14/2023 14:19   DG Chest Port 1 View Result Date: 09/14/2023 CLINICAL DATA:  Questionable sepsis. EXAM: PORTABLE CHEST 1 VIEW COMPARISON:  05/12/2021 FINDINGS: The cardiomediastinal contours are unremarkable. There is asymmetric veil like opacification over the left mid and left lower lung. Mild thickening of the minor fissure within the right midlung is noted with blunting of the right costophrenic angle. No airspace consolidation identified. IMPRESSION: 1. Asymmetric veil like opacification over the left mid and left lower lung compatible with layering pleural effusion. 2. Blunting  of the right costophrenic angle compatible with small right pleural effusion. Electronically Signed   By: Kimberley Penman M.D.   On: 09/14/2023 13:55    EKG: Independently reviewed.  Right bundle branch block and LAFB.  Assessment/Plan Principal Problem:   Acute metabolic encephalopathy Active Problems:   Reactive airway disease with acute exacerbation   Acute on chronic diastolic CHF (congestive heart failure) (HCC)   Chronic venous insufficiency   Type 2 diabetes mellitus (HCC)   Hypertension associated with diabetes (HCC)   GERD (gastroesophageal reflux disease)   Diabetes mellitus without complication (HCC)   OSA (obstructive sleep apnea)   (HFpEF) heart failure with preserved ejection fraction (HCC)   Morbid obesity (HCC)   #1 acute metabolic encephalopathy -Likely multifactorial secondary to hypercarbia as noted on VBG with a pH of 7.2/PCO2 of 120/PO2 of 58/bicarb of 51. - Patient noted on presentation per history from daughter and EDP that patient had a weeklong history of lethargy, altered mental status, decreased appetite. - Patient placed on BiPAP in the ED and on my  assessment patient had improved clinically more alert, answering some questions. - Chest x-ray done negative for any acute infiltrate. - CT head done negative for any acute abnormalities. - Urinalysis unremarkable. - Continue BiPAP as needed and nightly. - Repeat ABG in 1 hour while on BiPAP. - Placed on IV steroids, IV antibiotics, scheduled DuoNebs, Pulmicort, Brovana, Flonase , PPI. - Supportive care.  2.  Reactive airway disease with exacerbation -Patient with no diagnosed history of COPD however patient did endorse prior history of tobacco use. - VBG with a respiratory acidosis with a pH of 7.24, pCO2 of 120. - Chest x-ray no acute infiltrate. - Patient placed on BiPAP with some clinical improvement. - Placed on IV Solu-Medrol  60 mg every 12, IV Rocephin , Pulmicort, Brovana, scheduled DuoNebs, PPI, Flonase , Claritin.  3.  Acute on chronic diastolic CHF exacerbation -Chest x-ray with concern for pulmonary edema. - BNP noted to be elevated at 328. - First set of troponin of 29. - Repeat cardiac enzymes. - Check a 2D echo. - Resume home regimen ARB's. - Lasix 40 mg IV every 12 hours. - Strict I's and O's, daily weights. - If 2D echo is abnormal will consult with cardiology for further evaluation management.  4.  Hypertension -Resume home regimen ARB. - IV hydralazine  as needed.  5.  Diabetes mellitus type 2 -Check hemoglobin A1c. - Patient noted to be on Glucophage and Jardiance prior to admission. - Hold oral hypoglycemic agents. -Continue home regimen Neurontin. - Placed on SSI.  6.  Mild memory impairment -Resume home regimen Aricept .  7.  GERD -PPI.  8.  Hyperlipidemia -Continue home regimen statin.  9.  History of constipation -Resume home regimen Senokot-S.    10 OSA -Patient supposed to be on CPAP with daughter however noncompliant per daughter. - Daughter states patient chronically on home O2 unsure of how much. - BiPAP nightly.  11.  Morbid  obesity -Lifestyle modification - Outpatient follow-up with PCP.  DVT prophylaxis: Eliquis  Code Status:   DNR Family Communication:  Updated patient and daughter at bedside. Disposition Plan:   Patient is from:  Nursing facility  Anticipated DC to:  Back to nursing facility  Anticipated DC date:  2 to 3 days  Anticipated DC barriers: Clinical improvement  Consults called:  None Admission status:  Placed in observation.  Severity of Illness: The appropriate patient status for this patient is OBSERVATION. Observation status is judged to be reasonable  and necessary in order to provide the required intensity of service to ensure the patient's safety. The patient's presenting symptoms, physical exam findings, and initial radiographic and laboratory data in the context of their medical condition is felt to place them at decreased risk for further clinical deterioration. Furthermore, it is anticipated that the patient will be medically stable for discharge from the hospital within 2 midnights of admission.     Hilda Lovings MD [facility]  How to contact the [facility] Attending or Consulting provider 7A - 7P or covering provider during after hours 7P -7A, for this patient?   Check the care team in [facility] and look for a) attending/consulting [facility] provider listed and b) the [facility] team listed Log into [email or web address] and use [facility] universal password to access. If you do not have the password, please contact the hospital operator. Locate the [facility] provider you are looking for under [facility] and page to a number that you can be directly reached. If you still have difficulty reaching the provider, please page the [facility] (Director on Call) for the Hospitalists listed on amion for assistance.  09/14/2023, 5:42 PM

## 2023-09-15 ENCOUNTER — Observation Stay (HOSPITAL_COMMUNITY)

## 2023-09-15 DIAGNOSIS — I152 Hypertension secondary to endocrine disorders: Secondary | ICD-10-CM | POA: Diagnosis present

## 2023-09-15 DIAGNOSIS — G4733 Obstructive sleep apnea (adult) (pediatric): Secondary | ICD-10-CM

## 2023-09-15 DIAGNOSIS — J9602 Acute respiratory failure with hypercapnia: Secondary | ICD-10-CM | POA: Diagnosis not present

## 2023-09-15 DIAGNOSIS — E785 Hyperlipidemia, unspecified: Secondary | ICD-10-CM | POA: Diagnosis present

## 2023-09-15 DIAGNOSIS — E1159 Type 2 diabetes mellitus with other circulatory complications: Secondary | ICD-10-CM

## 2023-09-15 DIAGNOSIS — F0394 Unspecified dementia, unspecified severity, with anxiety: Secondary | ICD-10-CM | POA: Diagnosis present

## 2023-09-15 DIAGNOSIS — Z1152 Encounter for screening for COVID-19: Secondary | ICD-10-CM | POA: Diagnosis not present

## 2023-09-15 DIAGNOSIS — E8729 Other acidosis: Secondary | ICD-10-CM | POA: Diagnosis present

## 2023-09-15 DIAGNOSIS — E1122 Type 2 diabetes mellitus with diabetic chronic kidney disease: Secondary | ICD-10-CM | POA: Diagnosis present

## 2023-09-15 DIAGNOSIS — I5033 Acute on chronic diastolic (congestive) heart failure: Secondary | ICD-10-CM

## 2023-09-15 DIAGNOSIS — I5031 Acute diastolic (congestive) heart failure: Secondary | ICD-10-CM

## 2023-09-15 DIAGNOSIS — K219 Gastro-esophageal reflux disease without esophagitis: Secondary | ICD-10-CM | POA: Diagnosis present

## 2023-09-15 DIAGNOSIS — J320 Chronic maxillary sinusitis: Secondary | ICD-10-CM | POA: Diagnosis present

## 2023-09-15 DIAGNOSIS — N189 Chronic kidney disease, unspecified: Secondary | ICD-10-CM | POA: Diagnosis present

## 2023-09-15 DIAGNOSIS — I480 Paroxysmal atrial fibrillation: Secondary | ICD-10-CM | POA: Diagnosis present

## 2023-09-15 DIAGNOSIS — J9622 Acute and chronic respiratory failure with hypercapnia: Secondary | ICD-10-CM | POA: Diagnosis present

## 2023-09-15 DIAGNOSIS — J45901 Unspecified asthma with (acute) exacerbation: Secondary | ICD-10-CM | POA: Diagnosis present

## 2023-09-15 DIAGNOSIS — G9341 Metabolic encephalopathy: Secondary | ICD-10-CM | POA: Diagnosis present

## 2023-09-15 DIAGNOSIS — Z7901 Long term (current) use of anticoagulants: Secondary | ICD-10-CM | POA: Diagnosis not present

## 2023-09-15 DIAGNOSIS — I130 Hypertensive heart and chronic kidney disease with heart failure and stage 1 through stage 4 chronic kidney disease, or unspecified chronic kidney disease: Secondary | ICD-10-CM | POA: Diagnosis present

## 2023-09-15 DIAGNOSIS — Z6841 Body Mass Index (BMI) 40.0 and over, adult: Secondary | ICD-10-CM | POA: Diagnosis not present

## 2023-09-15 DIAGNOSIS — J9621 Acute and chronic respiratory failure with hypoxia: Secondary | ICD-10-CM | POA: Diagnosis present

## 2023-09-15 DIAGNOSIS — Z66 Do not resuscitate: Secondary | ICD-10-CM | POA: Diagnosis present

## 2023-09-15 DIAGNOSIS — E119 Type 2 diabetes mellitus without complications: Secondary | ICD-10-CM

## 2023-09-15 LAB — BLOOD CULTURE ID PANEL (REFLEXED) - BCID2

## 2023-09-15 LAB — CBC
HCT: 38.2 % (ref 36.0–46.0)
Hemoglobin: 10.7 g/dL — ABNORMAL LOW (ref 12.0–15.0)
MCH: 29 pg (ref 26.0–34.0)
MCHC: 28 g/dL — ABNORMAL LOW (ref 30.0–36.0)
MCV: 103.5 fL — ABNORMAL HIGH (ref 80.0–100.0)
Platelets: 193 10*3/uL (ref 150–400)
RBC: 3.69 MIL/uL — ABNORMAL LOW (ref 3.87–5.11)
RDW: 13.9 % (ref 11.5–15.5)
WBC: 3.2 10*3/uL — ABNORMAL LOW (ref 4.0–10.5)
nRBC: 0 % (ref 0.0–0.2)

## 2023-09-15 LAB — RESP PANEL BY RT-PCR (RSV, FLU A&B, COVID)  RVPGX2
Influenza A by PCR: NEGATIVE
Influenza B by PCR: NEGATIVE
Resp Syncytial Virus by PCR: NEGATIVE
SARS Coronavirus 2 by RT PCR: NEGATIVE

## 2023-09-15 LAB — RESPIRATORY PANEL BY PCR

## 2023-09-15 LAB — GLUCOSE, CAPILLARY
Glucose-Capillary: 111 mg/dL — ABNORMAL HIGH (ref 70–99)
Glucose-Capillary: 149 mg/dL — ABNORMAL HIGH (ref 70–99)
Glucose-Capillary: 168 mg/dL — ABNORMAL HIGH (ref 70–99)
Glucose-Capillary: 152 mg/dL — ABNORMAL HIGH (ref 70–99)

## 2023-09-15 LAB — IRON AND TIBC
Iron: 30 ug/dL (ref 28–170)
Saturation Ratios: 11 % (ref 10.4–31.8)
TIBC: 287 ug/dL (ref 250–450)
UIBC: 257 ug/dL

## 2023-09-15 LAB — VITAMIN B12: Vitamin B-12: 1176 pg/mL — ABNORMAL HIGH (ref 180–914)

## 2023-09-15 LAB — BASIC METABOLIC PANEL WITH GFR
Anion gap: 16 — ABNORMAL HIGH (ref 5–15)
BUN: 17 mg/dL (ref 8–23)
CO2: 36 mmol/L — ABNORMAL HIGH (ref 22–32)
Calcium: 9.7 mg/dL (ref 8.9–10.3)
Chloride: 89 mmol/L — ABNORMAL LOW (ref 98–111)
Creatinine, Ser: 0.58 mg/dL (ref 0.44–1.00)
GFR, Estimated: >60 mL/min (ref >60)
Glucose, Bld: 102 mg/dL — ABNORMAL HIGH (ref 70–99)
Potassium: 4.3 mmol/L (ref 3.5–5.1)
Sodium: 141 mmol/L (ref 135–145)

## 2023-09-15 LAB — FOLATE: Folate: 8.9 ng/mL (ref >5.9)

## 2023-09-15 LAB — FERRITIN: Ferritin: 55 ng/mL (ref 11–307)

## 2023-09-15 LAB — BRAIN NATRIURETIC PEPTIDE: B Natriuretic Peptide: 313.2 pg/mL — ABNORMAL HIGH (ref 0.0–100.0)

## 2023-09-15 LAB — MAGNESIUM: Magnesium: 1.5 mg/dL — ABNORMAL LOW (ref 1.7–2.4)

## 2023-09-15 LAB — PHOSPHORUS: Phosphorus: 2.2 mg/dL — ABNORMAL LOW (ref 2.5–4.6)

## 2023-09-15 LAB — HEMOGLOBIN A1C
Hgb A1c MFr Bld: 4.5 % — ABNORMAL LOW (ref 4.8–5.6)
Mean Plasma Glucose: 82.45 mg/dL

## 2023-09-15 LAB — ECHOCARDIOGRAM COMPLETE
Area-P 1/2: 3.26 cm2
S' Lateral: 3.9 cm
Weight: 4042.35 [oz_av]

## 2023-09-15 MED ORDER — VANCOMYCIN HCL 1.25 G IV SOLR
1250.0000 mg | INTRAVENOUS | Status: DC
  Filled 2023-09-15: qty 25

## 2023-09-15 MED ORDER — VANCOMYCIN HCL IN DEXTROSE 1-5 GM/200ML-% IV SOLN
1000.0000 mg | INTRAVENOUS | Status: AC
Start: 2023-09-15 — End: 2023-09-15
  Administered 2023-09-15 (×2): 1000 mg via INTRAVENOUS
  Filled 2023-09-15 (×2): qty 200

## 2023-09-15 MED ORDER — VANCOMYCIN HCL 2000 MG/400ML IV SOLN
2000.0000 mg | Freq: Once | INTRAVENOUS | Status: DC
  Filled 2023-09-15: qty 400

## 2023-09-15 MED ORDER — BUDESONIDE 0.5 MG/2ML IN SUSP
0.5000 mg | Freq: Two times a day (BID) | RESPIRATORY_TRACT | Status: DC
  Administered 2023-09-15 – 2023-09-18 (×6): 0.5 mg via RESPIRATORY_TRACT
  Filled 2023-09-15 (×6): qty 2

## 2023-09-15 MED ORDER — VANCOMYCIN HCL 1.25 G IV SOLR: 1250.0000 mg | INTRAVENOUS | Status: DC

## 2023-09-15 MED ORDER — MAGNESIUM SULFATE 2 GM/50ML IV SOLN
2.0000 g | Freq: Once | INTRAVENOUS | Status: AC
  Administered 2023-09-15: 2 g via INTRAVENOUS
  Filled 2023-09-15: qty 50

## 2023-09-15 MED ORDER — METOPROLOL TARTRATE 25 MG PO TABS
25.0000 mg | ORAL_TABLET | Freq: Two times a day (BID) | ORAL | Status: DC
  Administered 2023-09-15 – 2023-09-18 (×6): 25 mg via ORAL
  Filled 2023-09-15 (×7): qty 1

## 2023-09-15 MED ORDER — QUETIAPINE FUMARATE 25 MG PO TABS: 25.0000 mg | ORAL_TABLET | Freq: Every evening | ORAL | Status: DC | PRN

## 2023-09-15 MED ORDER — K PHOS MONO-SOD PHOS DI & MONO 155-852-130 MG PO TABS
250.0000 mg | ORAL_TABLET | Freq: Two times a day (BID) | ORAL | Status: AC
  Administered 2023-09-15 – 2023-09-17 (×6): 250 mg via ORAL
  Filled 2023-09-15 (×6): qty 1

## 2023-09-15 NOTE — TOC Initial Note (Signed)
 Transition of Care [facility]) - Initial/Assessment Note    Patient Details  Name: Elizabeth [name] MRN: [id number] Date of Birth: [birth year]  [facility] CM/SW Contact:    Bari Leys, RN Phone Number: 09/15/2023, 12:20 PM  Clinical Narrative:   NCM called to patient's daughter, Shaqueta, Casady (Daughter) [phone] (Mobile) , to introduce role of TOC/NCM and review for dc planning. [name] confirmed patient is LTC at [facility]. [name] expressed concerns over patient's inability to walk since admission to facility, reports patient last ambulatory in December 2024. NCM discussed with patient PT recommendation for short term rehab prior to return to LTC, dtr agreeable for patient to return to [facility] for short term rehab and transition back to LTC. NCM notified Whitney, admit coord at [facility] of PT recommendation for short term rehab prior to transition back to LTC.                  Expected Discharge Plan: Skilled Nursing Facility Barriers to Discharge: Continued Medical Work up   Patient Goals and CMS Choice Patient states their goals for this hospitalization and ongoing recovery are:: per dtr, return to LTC at [email or web address].gov Compare Post Acute Care list provided to:: Patient Represenative (must comment) ([place](POA),[name]  [phone] (Mobile)) Choice offered to / list presented to : [facility] POA / Guardian ([place](POA),[name]  [phone] (Mobile)) [place] ownership interest in [facility].provided to:: [facility] POA / Guardian (Baldo(POA),[name]  [phone] ([place]))    Expected Discharge Plan and Services       Living arrangements for the past 2 months: Skilled Nursing Facility [facility] LTC)                                      Prior Living Arrangements/Services Living arrangements for the past 2 months: Skilled Nursing Facility [facility] LTC) Lives with::  Self Patient language and need for interpreter reviewed:: Yes Do you feel safe going back to the place where you live?: Yes      Need for Family Participation in Patient Care: Yes (Comment) Care giver support system in place?: Yes (comment) Current home services: DME ([employer]) Criminal Activity/Legal Involvement Pertinent to Current Situation/Hospitalization: No - Comment as needed  Activities of Daily Living      Permission Sought/Granted                  Emotional Assessment         Alcohol / Substance Use: Not Applicable Psych Involvement: No (comment)  Admission diagnosis:  Acute on chronic respiratory failure with hypoxia and hypercapnia (HCC) [J96.21, J96.22] Acute metabolic encephalopathy [G93.41] Patient Active Problem List   Diagnosis Date Noted   Hypomagnesemia 09/15/2023   Acute metabolic encephalopathy 09/14/2023   Reactive airway disease with acute exacerbation 09/14/2023   Acute on chronic diastolic CHF (congestive heart failure) (HCC) 09/14/2023   Colitis 04/24/2023   Sepsis (HCC) 04/23/2023   (HFpEF) heart failure with preserved ejection fraction (HCC) 12/24/2022   Morbid obesity (HCC) 12/24/2022   OSA (obstructive sleep apnea) 03/28/2022   Diabetes mellitus without complication (HCC) 06/19/2021   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) 05/12/2021   COVID-19 virus infection 05/12/2021   Generalized weakness 05/12/2021   Type 2 diabetes mellitus (HCC) 05/12/2021   Hypertension associated with diabetes (HCC) 05/12/2021   Asthma 05/12/2021   GERD (gastroesophageal reflux disease)  Leukopenia    Chronic venous insufficiency 01/17/2017   Pain of left breast 01/17/2012   PCP:  Shari Daughters, MD Pharmacy:  No Pharmacies Listed    Social Drivers of Health (SDOH) Social History: SDOH Screenings   Food Insecurity: No Food Insecurity (04/26/2023)  Housing: Low Risk  (04/26/2023)  Transportation Needs: No Transportation  Needs (04/26/2023)  Utilities: Not At Risk (04/26/2023)  Social Connections: Unknown (04/24/2023)  Tobacco Use: Medium Risk (09/14/2023)   SDOH Interventions:     Readmission Risk Interventions    04/28/2023   11:56 AM  Readmission Risk Prevention Plan  Transportation Screening Complete  PCP or Specialist Appt within 5-7 Days Complete  Home Care Screening Complete  Medication Review (RN CM) Complete

## 2023-09-15 NOTE — Progress Notes (Signed)
 PROGRESS NOTE    Elizabeth [name]  ZOX:[id number] DOB: [birth year] DOA: 09/14/2023 PCP: Shari Daughters, MD   Chief Complaint  Patient presents with   Altered Mental Status    Brief Narrative:  Patient is a 83 year old female history of paroxysmal A-fib on chronic anticoagulation, chronic HFpEF, hypertension, hyperlipidemia, type 2 diabetes, OSA noncompliant with CPAP, GERD, CKD, memory impairment presenting to the ED with altered mental status/confusion, lethargy.  Workup concerning for an acute reactive airway disease exacerbation as patient noted to have elevated pCO2 in the 120s on presentation with a pH of 7.2.  Also concern was for volume overload/acute on chronic CHF exacerbation.  Patient treated for an acute reactive airways exacerbation, CHF exacerbation.  Preliminary blood cultures concerning for possible bacteremia.  Patient on empiric IV antibiotics.   Assessment & Plan:   Principal Problem:   Acute metabolic encephalopathy Active Problems:   Reactive airway disease with acute exacerbation   Acute on chronic diastolic CHF (congestive heart failure) (HCC)   Chronic venous insufficiency   Type 2 diabetes mellitus (HCC)   Hypertension associated with diabetes (HCC)   GERD (gastroesophageal reflux disease)   Diabetes mellitus without complication (HCC)   OSA (obstructive sleep apnea)   (HFpEF) heart failure with preserved ejection fraction (HCC)   Morbid obesity (HCC)   Hypomagnesemia   Hypophosphatemia  #1 acute metabolic encephalopathy -Likely multifactorial secondary to hypercarbia as noted on VBG with a pH of 7.2/PCO2 of 120/PO2 of 58/bicarb of 51.  Patient also with concerns of volume overload and possible bacteremia. - Patient noted on presentation per history from daughter and EDP that patient had a weeklong history of lethargy, altered mental status, decreased appetite. - Patient placed on BiPAP in the ED and overnight with clinical improvement.  - Chest x-ray  done negative for any acute infiltrate. - CT head done negative for any acute abnormalities. -CT abdomen and pelvis with moderate pleural effusions, slightly increased on the left since prior study, bilateral nephrolithiasis without hydronephrosis, colonic diverticulosis, cholelithiasis, 1.3 cm right adrenal nodule. - Urinalysis unremarkable. -Blood cultures with 2/4 bottles with gram-positive cocci with BCID of MRSE. -Sensitivities pending on blood cultures. - Continue BiPAP as needed and nightly. -Continue IV steroids, scheduled DuoNebs, Pulmicort, Brovana, Flonase , PPI. -Continue IV Rocephin . -Start empiric IV vancomycin  pending blood culture sensitivities. - Supportive care.   2.  Reactive airway disease with exacerbation -Patient with no diagnosed history of COPD however patient did endorse prior history of tobacco use. - VBG with a respiratory acidosis with a pH of 7.24, pCO2 of 120. - Chest x-ray no acute infiltrate. -Respiratory viral panel pending.  Sputum Gram stain and cultures pending. -RSV, COVID-19 PCR, influenza A and B PCR pending - Patient placed on BiPAP with clinical improvement. - Continue IV Solu-Medrol  60 mg every 12, IV Rocephin , Pulmicort, Brovana, scheduled DuoNebs, PPI, Flonase , Claritin.  3.  ??  Bacteremia -2/4 bottles preliminary results with GPC, MRSE.  BCID with sensitivities pending. -Patient currently on IV Rocephin . -Start IV vancomycin .   4.  Acute on chronic diastolic CHF exacerbation -Chest x-ray with concern for pulmonary edema. - BNP noted to be elevated at 328 on admission. - High-sensitivity troponins seem flattened and plateaued at 29 >>> 31 >>> 30.   - 2D echo with EF of 55 to 60%, and WMA, moderate concentric LVH, grade 1 DD.   - Patient with urine output of 900 cc over the past 24 hours.   - Continue Lasix 40 mg IV  every 12 hours.   - Continue ARB.   - Start Lopressor  25 mg p.o. twice daily.   - Strict I's and O's, daily weights.   5.   Hypertension - Continue home regimen ARB.   - Start Lopressor  25 mg twice daily.  - IV hydralazine  as needed.  6.  Hypomagnesemia/hypophosphatemia -Magnesium  at 1.5. -Phosphorus at 2.2. - Magnesium  sulfate 4 g IV x 1. -K-Phos 30 mmol IV x 1. -Repeat labs in the AM.   7.  Well-controlled diabetes mellitus type 2 - Hemoglobin A1c 4.5.  - Patient noted to be on Glucophage and Jardiance prior to admission. -CBG 111 this morning. - Continue to hold oral hypoglycemic agents. -Continue home regimen Neurontin. - SSI.   8.  Mild memory impairment - Continue home regimen Aricept .   9.  GERD -PPI.   10.  Hyperlipidemia -Continue home regimen statin.   11.  History of constipation - Continue Senokot twice daily.   12 OSA -Patient supposed to be on CPAP with daughter however noncompliant per daughter. - Daughter states patient chronically on home O2 unsure of how much. - BiPAP nightly.   13.  Morbid obesity -Lifestyle modification - Outpatient follow-up with PCP.   DVT prophylaxis: Eliquis  Code Status: DNR Family Communication: Updated patient.  No family at bedside. Disposition: Likely back to nursing home when clinically improved.  Status is: Inpatient The patient will require care spanning > 2 midnights and should be moved to inpatient because: Severity of illness   Consultants:  None  Procedures:  CT head 09/14/2023 Chest x-ray 09/14/2023 2D echo 09/15/2023 CT chest abdomen and pelvis 09/14/2023  Antimicrobials:  Anti-infectives (From admission, onward)    Start     Dose/Rate Route Frequency Ordered Stop   09/16/23 1015  Vancomycin  (VANCOCIN ) 1,250 mg in sodium chloride  0.9 % 250 mL IVPB  Status:  Discontinued       Placed in "Followed by" Linked Group   1,250 mg 166.7 mL/hr over 90 Minutes Intravenous Every 24 hours 09/15/23 0925 09/15/23 0928   09/16/23 1000  Vancomycin  (VANCOCIN ) 1,250 mg in sodium chloride  0.9 % 250 mL IVPB        1,250 mg 166.7 mL/hr over  90 Minutes Intravenous Every 24 hours 09/15/23 0929     09/15/23 1030  vancomycin  (VANCOCIN ) IVPB 1000 mg/200 mL premix        1,000 mg 200 mL/hr over 60 Minutes Intravenous Every hour 09/15/23 0929 09/15/23 1218   09/15/23 1015  vancomycin  (VANCOREADY) IVPB 2000 mg/400 mL  Status:  Discontinued       Placed in "Followed by" Linked Group   2,000 mg 200 mL/hr over 120 Minutes Intravenous  Once 09/15/23 0925 09/15/23 0928   09/14/23 1815  cefTRIAXone  (ROCEPHIN ) 2 g in sodium chloride  0.9 % 100 mL IVPB        2 g 200 mL/hr over 30 Minutes Intravenous Every 24 hours 09/14/23 1712 09/19/23 1814         Subjective: Patient laying in bed.  Currently off BiPAP this morning noted to have tolerated BiPAP last night.  Mental status improved.  Denies any chest pain or shortness of breath.  Denies any abdominal pain.  States she just wants to go back to her room at her facility.  Objective: Vitals:   09/15/23 1000 09/15/23 1100 09/15/23 1200 09/15/23 1448  BP:      Pulse: 80 67 71   Resp:  (!) 24 19   Temp:  98.4 F (  36.9 C)    TempSrc:  Oral    SpO2: 98% 95% (!) 89% 97%  Weight:        Intake/Output Summary (Last 24 hours) at 09/15/2023 1606 Last data filed at 09/15/2023 1300 Gross per 24 hour  Intake 600.31 ml  Output 1800 ml  Net -1199.69 ml   Filed Weights   09/14/23 1754 09/15/23 0500  Weight: 117.2 kg 114.6 kg    Examination:  General exam: Appears calm and comfortable  Respiratory system: Bibasilar crackles.  No wheezing.  Fair air movement.  Speaking in full sentences.  Cardiovascular system: S1 & S2 heard, RRR. No JVD, murmurs, rubs, gallops or clicks. No pedal edema. Gastrointestinal system: Abdomen is nondistended, soft and nontender. No organomegaly or masses felt. Normal bowel sounds heard. Central nervous system: Alert and oriented. No focal neurological deficits. Extremities: Symmetric 5 x 5 power. Skin: No rashes, lesions or ulcers Psychiatry: Judgement and  insight appear normal. Mood & affect appropriate.     Data Reviewed: I have personally reviewed following labs and imaging studies  CBC: Recent Labs  Lab 09/14/23 1300 09/15/23 0331  WBC 3.2* 3.2*  NEUTROABS 2.1  --   HGB 9.9* 10.7*  HCT 36.2 38.2  MCV 105.5* 103.5*  PLT 171 193    Basic Metabolic Panel: Recent Labs  Lab 09/14/23 1300 09/15/23 0331  NA 141 141  K 4.9 4.3  CL 91* 89*  CO2 37* 36*  GLUCOSE 84 102*  BUN 12 17  CREATININE 0.48 0.58  CALCIUM  9.3 9.7  MG 1.7 1.5*  PHOS  --  2.2*    GFR: Estimated Creatinine Clearance: 66.2 mL/min (by C-G formula based on SCr of 0.58 mg/dL).  Liver Function Tests: Recent Labs  Lab 09/14/23 1300  AST 13*  ALT 8  ALKPHOS 51  BILITOT 0.5  PROT 6.5  ALBUMIN 2.8*    CBG: Recent Labs  Lab 09/15/23 0825 09/15/23 1137 09/15/23 1532  GLUCAP 111* 149* 168*     Recent Results (from the past 240 hours)  Blood Culture (routine x 2)     Status: None (Preliminary result)   Collection Time: 09/14/23  1:00 PM   Specimen: BLOOD  Result Value Ref Range Status   Specimen Description   Final    BLOOD BLOOD LEFT HAND Performed at [facility], [address] [place], [place] [address]    Special Requests   Final    BOTTLES DRAWN AEROBIC AND ANAEROBIC Blood Culture results may not be optimal due to an inadequate volume of blood received in culture bottles Performed at [facility], [address] [place], [place] [address]    Culture   Final    NO GROWTH < 24 HOURS Performed at [facility], [address] [place], [place] [address]    Report Status PENDING  Incomplete  Blood Culture (routine x 2)     Status: None (Preliminary result)   Collection Time: 09/14/23  1:00 PM   Specimen: BLOOD  Result Value Ref Range Status   Specimen Description   Final    BLOOD BLOOD RIGHT ARM Performed at [facility], [address] [place], [place] [address]     Special Requests   Final    BOTTLES DRAWN AEROBIC AND ANAEROBIC Blood Culture results may not be optimal due to an inadequate volume of blood received in culture bottles Performed at [facility], [address] [place], [place] [address]  Culture  Setup Time   Final    GRAM POSITIVE COCCI IN BOTH AEROBIC AND ANAEROBIC BOTTLES Organism ID to follow CRITICAL RESULT CALLED TO, READ BACK BY AND VERIFIED WITH: Jesslyn Moro PHARMD, AT 0845 09/15/23 D. VANHOOK Performed at [facility], [address] [place], [place] [address]    Culture GRAM POSITIVE COCCI  Final   Report Status PENDING  Incomplete  Blood Culture ID Panel (Reflexed)     Status: Abnormal   Collection Time: 09/14/23  1:00 PM  Result Value Ref Range Status   Enterococcus faecalis NOT DETECTED NOT DETECTED Final   Enterococcus Faecium NOT DETECTED NOT DETECTED Final   Listeria monocytogenes NOT DETECTED NOT DETECTED Final   Staphylococcus species DETECTED (A) NOT DETECTED Final    Comment: CRITICAL RESULT CALLED TO, READ BACK BY AND VERIFIED WITH: JArmida Berry PHARMD, AT 0845 09/15/23 D. VANHOOK    Staphylococcus aureus (BCID) NOT DETECTED NOT DETECTED Final   Staphylococcus epidermidis DETECTED (A) NOT DETECTED Final    Comment: Methicillin (oxacillin) resistant coagulase negative staphylococcus. Possible blood culture contaminant (unless isolated from more than one blood culture draw or clinical case suggests pathogenicity). No antibiotic treatment is indicated for blood  culture contaminants. CRITICAL RESULT CALLED TO, READ BACK BY AND VERIFIED WITH: Jesslyn Moro PHARMD, AT 0845 09/15/23 D. VANHOOK    Staphylococcus lugdunensis NOT DETECTED NOT DETECTED Final   Streptococcus species NOT DETECTED NOT DETECTED Final   Streptococcus agalactiae NOT DETECTED NOT DETECTED Final   Streptococcus pneumoniae NOT DETECTED NOT DETECTED Final   Streptococcus pyogenes NOT DETECTED NOT DETECTED Final    A.calcoaceticus-baumannii NOT DETECTED NOT DETECTED Final   Bacteroides fragilis NOT DETECTED NOT DETECTED Final   Enterobacterales NOT DETECTED NOT DETECTED Final   Enterobacter cloacae complex NOT DETECTED NOT DETECTED Final   Escherichia coli NOT DETECTED NOT DETECTED Final   Klebsiella aerogenes NOT DETECTED NOT DETECTED Final   Klebsiella oxytoca NOT DETECTED NOT DETECTED Final   Klebsiella pneumoniae NOT DETECTED NOT DETECTED Final   Proteus species NOT DETECTED NOT DETECTED Final   Salmonella species NOT DETECTED NOT DETECTED Final   Serratia marcescens NOT DETECTED NOT DETECTED Final   Haemophilus influenzae NOT DETECTED NOT DETECTED Final   Neisseria meningitidis NOT DETECTED NOT DETECTED Final   Pseudomonas aeruginosa NOT DETECTED NOT DETECTED Final   Stenotrophomonas maltophilia NOT DETECTED NOT DETECTED Final   Candida albicans NOT DETECTED NOT DETECTED Final   Candida auris NOT DETECTED NOT DETECTED Final   Candida glabrata NOT DETECTED NOT DETECTED Final   Candida krusei NOT DETECTED NOT DETECTED Final   Candida parapsilosis NOT DETECTED NOT DETECTED Final   Candida tropicalis NOT DETECTED NOT DETECTED Final   Cryptococcus neoformans/gattii NOT DETECTED NOT DETECTED Final   Methicillin resistance mecA/C DETECTED (A) NOT DETECTED Final    Comment: CRITICAL RESULT CALLED TO, READ BACK BY AND VERIFIED WITH: JArmida Berry PHARMD, AT 0845 09/15/23 D. VANHOOK Performed at [facility], [address] [place], [place] [address]   MRSA Next Gen by PCR, Nasal     Status: Abnormal   Collection Time: 09/14/23  5:56 PM   Specimen: Nasal Mucosa; Nasal Swab  Result Value Ref Range Status   MRSA by PCR Next Gen DETECTED (A) NOT DETECTED Final    Comment: RESULT CALLED TO, READ BACK BY AND VERIFIED WITH: Gisele Lamas, RN 2312 09/14/23 BY Gearld Keep (NOTE) The GeneXpert MRSA Assay (FDA approved for NASAL specimens only), is one  component of a comprehensive MRSA colonization  surveillance program. It is not intended to diagnose MRSA infection nor to guide or monitor treatment for MRSA infections. Test performance is not FDA approved in patients less than 30 years old. Performed at [facility], [address], [place], [place] [address]          Radiology Studies: ECHOCARDIOGRAM COMPLETE Result Date: 09/15/2023    ECHOCARDIOGRAM REPORT   Patient Name:   CENDY OCONNOR Date of Exam: 09/15/2023 Medical Rec #:  [id number]       Height:       64.0 in Accession #:    [id number]      Weight:       252.6 lb Date of Birth:  07/26/1940        BSA:          2.161 m Patient Age:    83 years        BP:           198/64 mmHg Patient Gender: F               HR:           79 bpm. Exam Location:  Inpatient Procedure: 2D Echo, Color Doppler and Cardiac Doppler (Both Spectral and Color            Flow Doppler were utilized during procedure). Indications:    CHF Acute Diastolic I50.31  History:        Patient has prior history of Echocardiogram examinations, most                 recent 01/10/2023. CHF, Arrythmias:Atrial Fibrillation; Risk                 Factors:Diabetes and Hypertension.  Sonographer:    Hersey Lorenzo RDCS Referring Phys: [id number] Journe Hallmark V Jaeleen Inzunza IMPRESSIONS  1. Left ventricular ejection fraction, by estimation, is 55 to 60%. The left ventricle has normal function. The left ventricle has no regional wall motion abnormalities. There is moderate concentric left ventricular hypertrophy. Left ventricular diastolic parameters are consistent with Grade I diastolic dysfunction (impaired relaxation).  2. Right ventricular systolic function is mildly reduced. The right ventricular size is mildly enlarged. Tricuspid regurgitation signal is inadequate for assessing PA pressure.  3. Left atrial size was mildly dilated.  4. The mitral valve is degenerative. Trivial mitral valve regurgitation. No evidence of mitral stenosis. Moderate mitral annular calcification.  5. The  aortic valve is tricuspid. There is moderate calcification of the aortic valve. Aortic valve regurgitation is not visualized. Aortic valve sclerosis/calcification is present, without any evidence of aortic stenosis.  6. The inferior vena cava is dilated in size with >50% respiratory variability, suggesting right atrial pressure of 8 mmHg. FINDINGS  Left Ventricle: Left ventricular ejection fraction, by estimation, is 55 to 60%. The left ventricle has normal function. The left ventricle has no regional wall motion abnormalities. The left ventricular internal cavity size was normal in size. There is  moderate concentric left ventricular hypertrophy. Left ventricular diastolic parameters are consistent with Grade I diastolic dysfunction (impaired relaxation). Right Ventricle: The right ventricular size is mildly enlarged. Right vetricular wall thickness was not well visualized. Right ventricular systolic function is mildly reduced. Tricuspid regurgitation signal is inadequate for assessing PA pressure. Left Atrium: Left atrial size was mildly dilated. Right Atrium: Right atrial size was normal in size. Pericardium: There is no evidence of pericardial effusion. Mitral Valve: The mitral valve is degenerative  in appearance. There is moderate calcification of the mitral valve leaflet(s). Moderate mitral annular calcification. Trivial mitral valve regurgitation. No evidence of mitral valve stenosis. Tricuspid Valve: The tricuspid valve is normal in structure. Tricuspid valve regurgitation is not demonstrated. Aortic Valve: The aortic valve is tricuspid. There is moderate calcification of the aortic valve. Aortic valve regurgitation is not visualized. Aortic valve sclerosis/calcification is present, without any evidence of aortic stenosis. Pulmonic Valve: The pulmonic valve was not well visualized. Pulmonic valve regurgitation is not visualized. Aorta: The aortic root is normal in size and structure. Venous: The inferior  vena cava is dilated in size with greater than 50% respiratory variability, suggesting right atrial pressure of 8 mmHg. IAS/Shunts: No atrial level shunt detected by color flow Doppler.  LEFT VENTRICLE PLAX 2D LVIDd:         4.90 cm   Diastology LVIDs:         3.90 cm   LV e' medial:    4.24 cm/s LV PW:         1.30 cm   LV E/e' medial:  21.8 LV IVS:        1.10 cm   LV e' lateral:   6.64 cm/s LVOT diam:     2.00 cm   LV E/e' lateral: 13.9 LV SV:         92 LV SV Index:   42 LVOT Area:     3.14 cm  RIGHT VENTRICLE            IVC RV S prime:     9.25 cm/s  IVC diam: 2.70 cm TAPSE (M-mode): 1.4 cm LEFT ATRIUM             Index        RIGHT ATRIUM           Index LA diam:        3.30 cm 1.53 cm/m   RA Area:     15.00 cm LA Vol (A2C):   80.3 ml 37.16 ml/m  RA Volume:   41.60 ml  19.25 ml/m LA Vol (A4C):   68.1 ml 31.52 ml/m LA Biplane Vol: 76.6 ml 35.45 ml/m  AORTIC VALVE LVOT Vmax:   139.00 cm/s LVOT Vmean:  98.900 cm/s LVOT VTI:    0.292 m  AORTA Ao Root diam: 2.60 cm MITRAL VALVE MV Area (PHT): 3.26 cm     SHUNTS MV Decel Time: 233 msec     Systemic VTI:  0.29 m MV E velocity: 92.40 cm/s   Systemic Diam: 2.00 cm MV A velocity: 120.00 cm/s MV E/A ratio:  0.77 Dalton McleanMD Electronically signed by Archer Bear Signature Date/Time: 09/15/2023/10:00:31 AM    Final    CT CHEST ABDOMEN PELVIS WO CONTRAST Result Date: 09/14/2023 CLINICAL DATA:  Sepsis EXAM: CT CHEST, ABDOMEN AND PELVIS WITHOUT CONTRAST TECHNIQUE: Multidetector CT imaging of the chest, abdomen and pelvis was performed following the standard protocol without IV contrast. RADIATION DOSE REDUCTION: This exam was performed according to the departmental dose-optimization program which includes automated exposure control, adjustment of the mA and/or kV according to patient size and/or use of iterative reconstruction technique. COMPARISON:  04/23/2023 FINDINGS: CT CHEST FINDINGS Cardiovascular: Heart size normal. No pericardial effusion. Dilated  central pulmonary arteries, suggesting pulmonary hypertension. Scattered coronary and aortic calcified plaque. No TAA. Mediastinum/Nodes: No mediastinal hematoma, mass, or adenopathy. Lungs/Pleura: Moderate pleural effusions, slightly increased on the left since prior study. Dependent atelectasis posteriorly, left worse than right. Musculoskeletal: Vertebral endplate  spurring at multiple levels in the mid and lower thoracic spine. Advanced bilateral shoulder DJD. CT ABDOMEN PELVIS FINDINGS Hepatobiliary: Subcapsular cystic lesion with peripheral coarse calcification in segment 8 is stable. No new liver lesion or biliary ductal dilatation. Small partially calcified gallstones layer in the dependent aspect of the nondilated gallbladder. Pancreas: Unremarkable. No pancreatic ductal dilatation or surrounding inflammatory changes. Spleen: Normal in size without focal abnormality. Adrenals/Urinary Tract: 1.3 cm right adrenal nodule, previously 1.2 cm. Symmetric renal contours without hydronephrosis. Bilateral urolithiasis, largest stone peripherally on the left in the lower pole 9 mm, on the right 5 mm in the lower pole. Urinary bladder nondistended. Stomach/Bowel: Stomach is nondistended. Small bowel decompressed. Normal appendix. The colon is nondistended, with scattered diverticula most numerous in the distal descending and proximal sigmoid segments, with regional wall thickening but no significant adjacent inflammatory change. Vascular/Lymphatic: Mild scattered aortoiliac calcified plaque without AAA. No abdominal or pelvic adenopathy. Reproductive: Status post hysterectomy. No adnexal masses. Other: Pelvic phleboliths.  No ascites.  No free air. Musculoskeletal: Small paraumbilical hernia containing only mesenteric fat. Multilevel lumbar spondylitic change with stable grade 1 anterolisthesis L4-5. Hip DJD worse left than right. IMPRESSION: 1. Moderate pleural effusions, slightly increased on the left since prior  study. 2. Bilateral nephrolithiasis without hydronephrosis. 3. Colonic diverticulosis. 4. Cholelithiasis. 5. 1.3 cm right adrenal nodule, previously 1.2 cm. 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 Consider 12 month follow-up adrenal protocol CT. 6. Coronary and aortic Atherosclerosis (ICD10-I70.0). Electronically Signed   By: Nicoletta Barrier M.D.   On: 09/14/2023 14:19   CT Head Wo Contrast Result Date: 09/14/2023 CLINICAL DATA:  Mental status change, unknown cause, dementia EXAM: CT HEAD WITHOUT CONTRAST TECHNIQUE: Contiguous axial images were obtained from the base of the skull through the vertex without intravenous contrast. RADIATION DOSE REDUCTION: This exam was performed according to the departmental dose-optimization program which includes automated exposure control, adjustment of the mA and/or kV according to patient size and/or use of iterative reconstruction technique. COMPARISON:  10/14/2022, 08/06/2022 FINDINGS: Brain: Minor periventricular white matter microvascular ischemic changes bilaterally. No acute intracranial hemorrhage new infarction, mass lesion, midline shift, herniation, hydrocephalus, or extra-axial fluid collection. No focal mass effect or edema. Cisterns are patent. No cerebellar abnormality. Vascular: No hyperdense vessel or unexpected calcification. Skull: No acute osseous finding fracture. Left mastoid effusion noted. Right mastoid is clear. Sinuses/Orbits: Chronic complete opacification of the right maxillary sinus with surrounding maxillary wall hyperostosis as well as partially calcified soft tissue protrusion into the nasal cavity. No orbital abnormality. Other: None. IMPRESSION: 1. No acute intracranial abnormality by noncontrast CT. 2. Minor white matter microvascular ischemic changes. 3. 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 4. Left mastoid effusion.  Electronically Signed   By: Melven Stable.  Shick M.D.   On: 09/14/2023 14:19   DG Chest Port 1 View Result Date: 09/14/2023 CLINICAL DATA:  Questionable sepsis. EXAM: PORTABLE CHEST 1 VIEW COMPARISON:  05/12/2021 FINDINGS: The cardiomediastinal contours are unremarkable. There is asymmetric veil like opacification over the left mid and left lower lung. Mild thickening of the minor fissure within the right midlung is noted with blunting of the right costophrenic angle. No airspace consolidation identified. IMPRESSION: 1. Asymmetric veil like opacification over the left mid and left lower lung compatible with layering pleural effusion. 2. Blunting of the right costophrenic angle compatible with small right pleural effusion. Electronically Signed   By: Carolynne Citron  Martin Slay M.D.   On: 09/14/2023 13:55        Scheduled Meds:  apixaban   5 mg Oral BID   arformoterol  15 mcg Nebulization BID   budesonide (PULMICORT) nebulizer solution  0.5 mg Nebulization Q12H   cetirizine  10 mg Oral Daily   Chlorhexidine  Gluconate Cloth  6 each Topical Daily   cyanocobalamin   1,000 mcg Oral Daily   diclofenac Sodium  2 g Topical QID   donepezil   5 mg Oral QHS   fluticasone   1 spray Each Nare BID   furosemide  40 mg Intravenous Q12H   gabapentin  100 mg Oral Daily   insulin  aspart  0-15 Units Subcutaneous TID WC   ipratropium-albuterol   3 mL Nebulization Q6H   lidocaine   1 patch Transdermal QHS   losartan   100 mg Oral Daily   methylPREDNISolone  (SOLU-MEDROL ) injection  60 mg Intravenous Q12H   Followed by   Cecily Cohen ON 09/17/2023] predniSONE  40 mg Oral Q breakfast   metoprolol  tartrate  25 mg Oral BID   mouth rinse  15 mL Mouth Rinse 4 times per day   oxybutynin   10 mg Oral Daily   pantoprazole   40 mg Oral Daily   phosphorus  250 mg Oral BID   potassium chloride  10 mEq Oral Daily   rosuvastatin   5 mg Oral Daily   senna  2 tablet Oral Daily   sodium chloride  flush  3 mL Intravenous Q12H   sodium chloride  flush  3 mL  Intravenous Q12H   Continuous Infusions:  sodium chloride      cefTRIAXone  (ROCEPHIN )  IV Stopped (09/14/23 1859)   [START ON 09/16/2023] vancomycin        LOS: 0 days    Time spent: 40 minutes    Hilda Lovings, MD [facility]   To contact the attending provider between 7A-7P or the covering provider during after hours 7P-7A, please log into the web site [email or web address] and access using universal [place] password for that web site. If you do not have the password, please call the hospital operator.  09/15/2023, 4:06 PM

## 2023-09-15 NOTE — Evaluation (Signed)
 Occupational Therapy Evaluation Patient Details Name: Elizabeth [name] MRN: [id number] DOB: 07-Nov-1940 Today's Date: 09/15/2023   History of Present Illness   Patient is a 83 yo female presents to therapy s/p hospital admission on 09/14/2023 due to AMS. Pt was found to have acute metabolic encephalopathy, reactive airway dz requiring BiPAP and acute on chronic dCHF exacerbation. Pt PMH includes but is not limited to: heart failure, arthritis, asthma, GERD, HTN, DM II, PAF, HLD, OSA, CKD, and memory impairment.     Clinical Impressions Patient evaluated by Occupational Therapy with no further acute OT needs identified. All education has been completed and the patient has no further questions. Defer further OT intervention to LTC SNF OTs as they know patient's functional levels.  See below for any follow-up Occupational Therapy or equipment needs. OT is signing off. Thank you for this referral.      If plan is discharge home, recommend the following:   Two people to help with walking and/or transfers;Assistance with cooking/housework;Direct supervision/assist for medications management;Assist for transportation;Help with stairs or ramp for entrance;Direct supervision/assist for financial management;Supervision due to cognitive status;Two people to help with bathing/dressing/bathroom     Functional Status Assessment   Patient has not had a recent decline in their functional status     Equipment Recommendations   None recommended by OT      Precautions/Restrictions   Precautions Precautions: Fall Restrictions Weight Bearing Restrictions Per Provider Order: No     Mobility Bed Mobility Overal bed mobility: Needs Assistance Bed Mobility: Rolling, Supine to Sit, Sit to Supine Rolling: Max assist, Used rails   Supine to sit: Max assist, +2 for physical assistance, +2 for safety/equipment, HOB elevated Sit to supine: Max assist, +2 for physical assistance, HOB elevated    General bed mobility comments: pt required increased time for motor processing and planning, mulitmodal cues for reaching to bed rail to assist with rolling tasks, pt required max A x 2 for supine <> sit with minimal initation of movement to return to bed and total A x 2 with use of hospital bed for repositioning in bed    Transfers                   General transfer comment: NT          ADL either performed or assessed with clinical judgement   ADL Overall ADL's : Needs assistance/impaired Eating/Feeding: Sitting;Supervision/ safety Eating/Feeding Details (indicate cue type and reason): drinking coffee sitting at bed level with minimal spillage (coffee was cooled at time of spill). Grooming: Sitting;Minimal assistance   Upper Body Bathing: Sitting;Moderate assistance   Lower Body Bathing: Sitting/lateral leans;Total assistance   Upper Body Dressing : Moderate assistance;Sitting   Lower Body Dressing: Sitting/lateral leans;Total assistance     Toilet Transfer Details (indicate cue type and reason): patient was +2 to advance to EOB Toileting- Clothing Manipulation and Hygiene: Bed level;Total assistance               Vision   Vision Assessment?: No apparent visual deficits            Pertinent Vitals/Pain Pain Assessment Pain Assessment: No/denies pain     Extremity/Trunk Assessment Upper Extremity Assessment Upper Extremity Assessment: Overall WFL for tasks assessed   Lower Extremity Assessment Lower Extremity Assessment: Defer to PT evaluation       Communication Communication Communication: Impaired Factors Affecting Communication: Difficulty expressing self   Cognition Arousal: Alert Behavior During Therapy: Restless Cognition: History of cognitive  impairments                        Home Living Family/patient expects to be discharged to:: Skilled nursing facility                                 Additional  Comments: [facility]      Prior Functioning/Environment Prior Level of Function : Needs assist;Patient poor historian/Family not available       Physical Assist : Mobility (physical);ADLs (physical)     Mobility Comments: no family present, pt not a reliable historian ADLs Comments: pt is unclear as to if she is transfering OOB with physical assist or mechanical assist at SNF    OT Problem List: Impaired balance (sitting and/or standing);Decreased safety awareness;Decreased activity tolerance;Decreased knowledge of use of DME or AE   OT Treatment/Interventions:        OT Goals(Current goals can be found in the care plan section)   Acute Rehab OT Goals OT Goal Formulation: All assessment and education complete, DC therapy   OT Frequency:       Co-evaluation   Reason for Co-Treatment: Complexity of the patient's impairments (multi-system involvement);Necessary to address cognition/behavior during functional activity;For patient/therapist safety PT goals addressed during session: Mobility/safety with mobility;Balance OT goals addressed during session: ADL's and self-care;Proper use of Adaptive equipment and DME      AM-PAC OT "6 Clicks" Daily Activity     Outcome Measure Help from another person eating meals?: A Little Help from another person taking care of personal grooming?: A Little Help from another person toileting, which includes using toliet, bedpan, or urinal?: Total Help from another person bathing (including washing, rinsing, drying)?: Total Help from another person to put on and taking off regular upper body clothing?: Total Help from another person to put on and taking off regular lower body clothing?: Total 6 Click Score: 10   End of Session Nurse Communication: Mobility status  Activity Tolerance: Patient tolerated treatment well Patient left: in bed;with call bell/phone within reach;with bed alarm set  OT Visit Diagnosis: Unsteadiness on feet  (R26.81);Other abnormalities of gait and mobility (R26.89);Muscle weakness (generalized) (M62.81)                Time: 1914-7829 OT Time Calculation (min): 19 min Charges:  OT General Charges $OT Visit: 1 Visit OT Evaluation $OT Eval Low Complexity: 1 Low  Zariah Jost OTR/L, MS Acute Rehabilitation Department Office# [phone]   Jame Maze 09/15/2023, 12:55 PM

## 2023-09-15 NOTE — NC FL2 (Signed)
 [place]  MEDICAID FL2 LEVEL OF CARE FORM     IDENTIFICATION  Patient Name: Elizabeth [name] Birthdate: 16-Jun-1940 Sex: female Admission Date (Current Location): 09/14/2023  [facility] and [place] Number:  Producer, television/film/video and Address:  [facility],  [address], [place] [address]      Provider Number: [id number]  Attending Physician Name and Address:  Armenta Landau, MD  Relative Name and Phone Number:  Christman(POA),[name]  [phone] [facility])    Current Level of Care: Hospital Recommended Level of Care: Skilled Nursing Facility Prior Approval Number:    Date Approved/Denied:   PASRR Number: [id number]  Discharge Plan: SNF    Current Diagnoses: Patient Active Problem List   Diagnosis Date Noted   Hypomagnesemia 09/15/2023   Acute metabolic encephalopathy 09/14/2023   Reactive airway disease with acute exacerbation 09/14/2023   Acute on chronic diastolic CHF (congestive heart failure) (HCC) 09/14/2023   Colitis 04/24/2023   Sepsis (HCC) 04/23/2023   (HFpEF) heart failure with preserved ejection fraction (HCC) 12/24/2022   Morbid obesity (HCC) 12/24/2022   OSA (obstructive sleep apnea) 03/28/2022   Diabetes mellitus without complication (HCC) 06/19/2021   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) 05/12/2021   COVID-19 virus infection 05/12/2021   Generalized weakness 05/12/2021   Type 2 diabetes mellitus (HCC) 05/12/2021   Hypertension associated with diabetes (HCC) 05/12/2021   Asthma 05/12/2021   GERD (gastroesophageal reflux disease)    Leukopenia    Chronic venous insufficiency 01/17/2017   Pain of left breast 01/17/2012    Orientation RESPIRATION BLADDER Height & Weight     Self  O2 Incontinent, External catheter Weight: 114.6 kg Height:     BEHAVIORAL SYMPTOMS/MOOD NEUROLOGICAL BOWEL NUTRITION STATUS      Continent Diet (heart diet)  AMBULATORY STATUS COMMUNICATION OF NEEDS Skin   Extensive  Assist Verbally Other (Comment) (Ecchymosis right upper arm)                       Personal Care Assistance Level of Assistance  Bathing, Feeding, Dressing Bathing Assistance: Maximum assistance Feeding assistance: Maximum assistance Dressing Assistance: Maximum assistance     Functional Limitations Info  Sight, Hearing, Speech Sight Info: Adequate Hearing Info: Adequate Speech Info: Adequate    SPECIAL CARE FACTORS FREQUENCY  PT (By licensed PT), OT (By licensed OT)     PT Frequency: 5x/wk OT Frequency: 5x/wk            Contractures Contractures Info: Not present    Additional Factors Info  Code Status, Allergies, Psychotropic Code Status Info: DNR Allergies Info: NKA Psychotropic Info: N/A         Current Medications (09/15/2023):  This is the current hospital active medication list Current Facility-Administered Medications  Medication Dose Route Frequency Provider Last Rate Last Admin   0.9 %  sodium chloride  infusion  250 mL Intravenous PRN Armenta Landau, MD       acetaminophen  (TYLENOL ) tablet 650 mg  650 mg Oral Q6H PRN Armenta Landau, MD       Or   acetaminophen  (TYLENOL ) suppository 650 mg  650 mg Rectal Q6H PRN Armenta Landau, MD       apixaban  (ELIQUIS ) tablet 5 mg  5 mg Oral BID Armenta Landau, MD   5 mg at 09/15/23 1001   arformoterol  (BROVANA ) nebulizer solution 15 mcg  15 mcg Nebulization BID Armenta Landau, MD   15 mcg at 09/15/23  1610   bisacodyl  (DULCOLAX) EC tablet 10 mg  10 mg Oral Daily PRN Thompson, Daniel V, MD       budesonide (PULMICORT) nebulizer solution 0.5 mg  0.5 mg Nebulization Q12H Armenta Landau, MD       cefTRIAXone  (ROCEPHIN ) 2 g in sodium chloride  0.9 % 100 mL IVPB  2 g Intravenous Q24H Armenta Landau, MD   Stopped at 09/14/23 1859   cetirizine (ZYRTEC) tablet 10 mg  10 mg Oral Daily Armenta Landau, MD   10 mg at 09/15/23 1000   Chlorhexidine  Gluconate Cloth 2 % PADS 6 each  6 each Topical Daily  Armenta Landau, MD   6 each at 09/14/23 1757   cyanocobalamin  (VITAMIN B12) tablet 1,000 mcg  1,000 mcg Oral Daily Thompson, Daniel V, MD   1,000 mcg at 09/15/23 1001   diclofenac Sodium (VOLTAREN) 1 % topical gel 2 g  2 g Topical QID Armenta Landau, MD   2 g at 09/15/23 1001   donepezil  (ARICEPT ) tablet 5 mg  5 mg Oral QHS Armenta Landau, MD       fluticasone  (FLONASE ) 50 MCG/ACT nasal spray 1 spray  1 spray Each Nare BID Armenta Landau, MD       furosemide (LASIX) injection 40 mg  40 mg Intravenous Q12H Armenta Landau, MD   40 mg at 09/15/23 0629   gabapentin (NEURONTIN) capsule 100 mg  100 mg Oral Daily Armenta Landau, MD   100 mg at 09/15/23 1001   hydrALAZINE  (APRESOLINE ) injection 10 mg  10 mg Intravenous Q6H PRN Armenta Landau, MD   10 mg at 09/15/23 0104   insulin  aspart (novoLOG ) injection 0-15 Units  0-15 Units Subcutaneous TID WC Armenta Landau, MD       ipratropium-albuterol  (DUONEB) 0.5-2.5 (3) MG/3ML nebulizer solution 3 mL  3 mL Nebulization Q6H Armenta Landau, MD   3 mL at 09/15/23 9604   lidocaine  (LIDODERM ) 5 % 1 patch  1 patch Transdermal QHS Armenta Landau, MD   1 patch at 09/14/23 2211   losartan  (COZAAR ) tablet 100 mg  100 mg Oral Daily Armenta Landau, MD   100 mg at 09/15/23 1001   methylPREDNISolone  sodium succinate (SOLU-MEDROL ) 125 mg/2 mL injection 60 mg  60 mg Intravenous Q12H Armenta Landau, MD   60 mg at 09/15/23 5409   Followed by   Cecily Cohen ON 09/17/2023] predniSONE (DELTASONE) tablet 40 mg  40 mg Oral Q breakfast Armenta Landau, MD       metoprolol  tartrate (LOPRESSOR ) tablet 25 mg  25 mg Oral BID Armenta Landau, MD   25 mg at 09/15/23 1001   morphine (PF) 2 MG/ML injection 2 mg  2 mg Intravenous Q2H PRN Armenta Landau, MD   2 mg at 09/14/23 2232   ondansetron  (ZOFRAN ) tablet 4 mg  4 mg Oral Q6H PRN Armenta Landau, MD       Or   ondansetron  (ZOFRAN ) injection 4 mg  4 mg Intravenous Q6H PRN Armenta Landau, MD       Oral care mouth rinse  15 mL Mouth Rinse 4 times per day Armenta Landau, MD   15 mL at 09/15/23 8119   Oral care mouth rinse  15 mL Mouth Rinse PRN Armenta Landau, MD       oxybutynin  (DITROPAN -XL) 24 hr tablet 10 mg  10 mg Oral Daily Armenta Landau, MD  10 mg at 09/15/23 1000   pantoprazole  (PROTONIX ) EC tablet 40 mg  40 mg Oral Daily Armenta Landau, MD   40 mg at 09/15/23 1001   phosphorus (K PHOS NEUTRAL) tablet 250 mg  250 mg Oral BID Armenta Landau, MD   250 mg at 09/15/23 1001   polyethylene glycol (MIRALAX  / GLYCOLAX ) packet 17 g  17 g Oral Daily PRN Armenta Landau, MD       polyvinyl alcohol (LIQUIFILM TEARS) 1.4 % ophthalmic solution 1 drop  1 drop Both Eyes QID PRN Armenta Landau, MD       potassium chloride (KLOR-CON) CR tablet 10 mEq  10 mEq Oral Daily Armenta Landau, MD   10 mEq at 09/15/23 1002   QUEtiapine (SEROQUEL) tablet 25 mg  25 mg Oral QHS PRN Armenta Landau, MD       rosuvastatin  (CRESTOR ) tablet 5 mg  5 mg Oral Daily Armenta Landau, MD   5 mg at 09/15/23 1001   senna (SENOKOT) tablet 17.2 mg  2 tablet Oral Daily Armenta Landau, MD   17.2 mg at 09/15/23 1001   sodium chloride  flush (NS) 0.9 % injection 3 mL  3 mL Intravenous Q12H Armenta Landau, MD   3 mL at 09/15/23 1003   sodium chloride  flush (NS) 0.9 % injection 3 mL  3 mL Intravenous Q12H Armenta Landau, MD   3 mL at 09/15/23 1003   sodium chloride  flush (NS) 0.9 % injection 3 mL  3 mL Intravenous PRN Armenta Landau, MD       [START ON 09/16/2023] Vancomycin  (VANCOCIN ) 1,250 mg in sodium chloride  0.9 % 250 mL IVPB  1,250 mg Intravenous Q24H Armenta Landau, MD         Discharge Medications: Please see discharge summary for a list of discharge medications.  Relevant Imaging Results:  Relevant Lab Results:   Additional Information SSN: [id number]  Bari Leys, RN

## 2023-09-15 NOTE — Progress Notes (Signed)
 PHARMACY - PHYSICIAN COMMUNICATION CRITICAL VALUE ALERT - BLOOD CULTURE IDENTIFICATION (BCID)  Elizabeth [name] is an 83 y.o. female who presented to [facility] on 09/14/2023 with a chief complaint of AMS/confusion.  Assessment: 2 of 4 Bcx bottles from same set with GPC, BCID identifying as staph epi (mecA+)  Name of physician (or Provider) Contacted: Dr. Hildy Lowers  Current antibiotics: ceftriaxone   Changes to prescribed antibiotics recommended:  Could be a contaminant since growing in the same set, however, second Bcx set has only been incubating <24hrs. Consider adding vancomycin  while awaiting second blood culture set to finalize.   Results for orders placed or performed during the hospital encounter of 09/14/23  Blood Culture ID Panel (Reflexed) (Collected: 09/14/2023  1:00 PM)  Result Value Ref Range   Enterococcus faecalis NOT DETECTED NOT DETECTED   Enterococcus Faecium NOT DETECTED NOT DETECTED   Listeria monocytogenes NOT DETECTED NOT DETECTED   Staphylococcus species DETECTED (A) NOT DETECTED   Staphylococcus aureus (BCID) NOT DETECTED NOT DETECTED   Staphylococcus epidermidis DETECTED (A) NOT DETECTED   Staphylococcus lugdunensis NOT DETECTED NOT DETECTED   Streptococcus species NOT DETECTED NOT DETECTED   Streptococcus agalactiae NOT DETECTED NOT DETECTED   Streptococcus pneumoniae NOT DETECTED NOT DETECTED   Streptococcus pyogenes NOT DETECTED NOT DETECTED   A.calcoaceticus-baumannii NOT DETECTED NOT DETECTED   Bacteroides fragilis NOT DETECTED NOT DETECTED   Enterobacterales NOT DETECTED NOT DETECTED   Enterobacter cloacae complex NOT DETECTED NOT DETECTED   Escherichia coli NOT DETECTED NOT DETECTED   Klebsiella aerogenes NOT DETECTED NOT DETECTED   Klebsiella oxytoca NOT DETECTED NOT DETECTED   Klebsiella pneumoniae NOT DETECTED NOT DETECTED   Proteus species NOT DETECTED NOT DETECTED   Salmonella species NOT DETECTED NOT DETECTED   Serratia marcescens NOT DETECTED  NOT DETECTED   Haemophilus influenzae NOT DETECTED NOT DETECTED   Neisseria meningitidis NOT DETECTED NOT DETECTED   Pseudomonas aeruginosa NOT DETECTED NOT DETECTED   Stenotrophomonas maltophilia NOT DETECTED NOT DETECTED   Candida albicans NOT DETECTED NOT DETECTED   Candida auris NOT DETECTED NOT DETECTED   Candida glabrata NOT DETECTED NOT DETECTED   Candida krusei NOT DETECTED NOT DETECTED   Candida parapsilosis NOT DETECTED NOT DETECTED   Candida tropicalis NOT DETECTED NOT DETECTED   Cryptococcus neoformans/gattii NOT DETECTED NOT DETECTED   Methicillin resistance mecA/C DETECTED (A) NOT DETECTED    Roselee Cong, PharmD Clinical Pharmacist  5/26/20258:52 AM

## 2023-09-15 NOTE — Evaluation (Addendum)
 Physical Therapy Evaluation Patient Details Name: Elizabeth [name] MRN: [id number] DOB: [birth year] Today's Date: 09/15/2023  History of Present Illness  83 yo female presents to therapy s/p hospital admission on 09/14/2023 due to AMS. Pt was found to have acute metabolic encephalopathy, reactive airway dz requiring BiPAP and acute on chronic dCHF exacerbation. Pt PMH includes but is not limited to: heart failure, arthritis, asthma, GERD, HTN, DM II, PAF, HLD, OSA, CKD, and memory impairment.  Clinical Impression      Pt admitted with above diagnosis.  Pt currently with functional limitations due to the deficits listed below (see PT Problem List). Pt restless in bed when therapist arrived. Pt agreeable to participation with therapy. Pt appears to be a a poor historian and unable to provide insight to baseline mobility at SNF. Pt required extensive assist for bed mobility tasks, multimodal cues and increased time, pt required A for sitting balance EOB. Pt denies pain or SOB. Pt on 3 L/min supplemental O2 at time of intervention and O2 saturation >/=95%. . Pt returned to bed and all needs in place. Pt will benefit from acute skilled PT to increase their independence and safety with mobility to allow discharge.       If plan is discharge home, recommend the following: Two people to help with walking and/or transfers;Two people to help with bathing/dressing/bathroom;Assistance with cooking/housework;Direct supervision/assist for medications management;Direct supervision/assist for financial management;Assist for transportation;Help with stairs or ramp for entrance;Supervision due to cognitive status   Can travel by private vehicle   No    Equipment Recommendations None recommended by PT  Recommendations for Other Services       Functional Status Assessment Patient has had a recent decline in their functional status and demonstrates the ability to make significant improvements in function in a  reasonable and predictable amount of time.     Precautions / Restrictions Precautions Precautions: Fall Restrictions Weight Bearing Restrictions Per Provider Order: No      Mobility  Bed Mobility Overal bed mobility: Needs Assistance Bed Mobility: Rolling, Supine to Sit, Sit to Supine Rolling: Max assist, Used rails   Supine to sit: Max assist, +2 for physical assistance, +2 for safety/equipment, HOB elevated Sit to supine: Max assist, +2 for physical assistance, HOB elevated   General bed mobility comments: pt required increased time for motor processing and planning, mulitmodal cues for reaching to bed rail to assist with rolling tasks, pt required max A x 2 for supine <> sit with minimal initation of movement to return to bed and total A x 2 with use of hospital bed for repositioning in bed    Transfers                   General transfer comment: NT    Ambulation/Gait               General Gait Details: NT  Stairs            Wheelchair Mobility     Tilt Bed    Modified Rankin (Stroke Patients Only)       Balance Overall balance assessment: Needs assistance Sitting-balance support: Feet unsupported, Bilateral upper extremity supported Sitting balance-Leahy Scale: Poor Sitting balance - Comments: posterior lean and anterior pelvic tilt  with pt unfortunatly unable to reach floor with B LE with bed in lowest position, pt required cues and min to CGA to maintain sitting balance EOB Postural control: Posterior lean  Pertinent Vitals/Pain Pain Assessment Pain Assessment: Faces Pain Score: 0-No pain    Home Living Family/patient expects to be discharged to:: Skilled nursing facility                   Additional Comments: [facility]    Prior Function Prior Level of Function : Needs assist;Patient poor historian/Family not available       Physical Assist : Mobility (physical);ADLs  (physical)     Mobility Comments: no family present, pt not a reliable historian ADLs Comments: pt is unclear as to if she is transfering OOB with physical assist or mechanical assist at [facility]     Extremity/Trunk Assessment        Lower Extremity Assessment Lower Extremity Assessment: Generalized weakness       Communication   Communication Communication: Impaired Factors Affecting Communication: Difficulty expressing self    Cognition Arousal: Alert Behavior During Therapy: Restless   PT - Cognitive impairments: History of cognitive impairments, No family/caregiver present to determine baseline                         Following commands: Impaired Following commands impaired: Follows one step commands inconsistently, Follows one step commands with increased time     Cueing       General Comments      Exercises     Assessment/Plan    PT Assessment Patient needs continued PT services  PT Problem List Decreased strength;Decreased activity tolerance;Decreased balance;Decreased mobility;Decreased coordination;Decreased knowledge of precautions;Cardiopulmonary status limiting activity       PT Treatment Interventions Functional mobility training;Therapeutic activities;Therapeutic exercise;Balance training;Neuromuscular re-education;Patient/family education    PT Goals (Current goals can be found in the Care Plan section)  Acute Rehab PT Goals PT Goal Formulation: Patient unable to participate in goal setting    Frequency Min 2X/week     Co-evaluation PT/OT/SLP Co-Evaluation/Treatment: Yes Reason for Co-Treatment: Complexity of the patient's impairments (multi-system involvement);Necessary to address cognition/behavior during functional activity;For patient/therapist safety PT goals addressed during session: Mobility/safety with mobility;Balance OT goals addressed during session: ADL's and self-care;Proper use of Adaptive equipment and DME        AM-PAC PT "6 Clicks" Mobility  Outcome Measure Help needed turning from your back to your side while in a flat bed without using bedrails?: A Lot Help needed moving from lying on your back to sitting on the side of a flat bed without using bedrails?: A Lot Help needed moving to and from a bed to a chair (including a wheelchair)?: Total Help needed standing up from a chair using your arms (e.g., wheelchair or bedside chair)?: Total Help needed to walk in hospital room?: Total Help needed climbing 3-5 steps with a railing? : Total 6 Click Score: 8    End of Session Equipment Utilized During Treatment: Oxygen  Activity Tolerance: Patient limited by fatigue Patient left: in bed;with call bell/phone within reach Nurse Communication: Mobility status PT Visit Diagnosis: Unsteadiness on feet (R26.81);Muscle weakness (generalized) (M62.81);Difficulty in walking, not elsewhere classified (R26.2)    Time: 0454-0981 PT Time Calculation (min) (ACUTE ONLY): 21 min   Charges:   PT Evaluation $PT Eval Low Complexity: 1 Low   PT General Charges $$ ACUTE PT VISIT: 1 Visit         Cary Clarks, PT Acute Rehab   Annalee Kiang 09/15/2023, 12:09 PM

## 2023-09-15 NOTE — Progress Notes (Signed)
 Pharmacy Antibiotic Note  Elizabeth [name] is a 83 y.o. female admitted on 09/14/2023 with AMS/confusion. Bcx growing 2/4 GPC (same set), BCID identifying as staph epi (MecA +). This could be a contaminant, however, second set of Bcx only incubated <24hrs. Pharmacy has been consulted for vancomycin  dosing while awaiting finalization of second set of Bcx.  Plan: - Give vancomycin  2000mg  IV x1, then 1250mg  IV q24h (eAUC 512 using Scr rounded to 0.8, Vd 0.5) - Vanc levels PRN - Monitor renal function, cultures, and overall clinical picture  - De-escalate abx as able   Weight: 114.6 kg (252 lb 10.4 oz)  Temp (24hrs), Avg:98.3 F (36.8 C), Min:98 F (36.7 C), Max:98.8 F (37.1 C)  Recent Labs  Lab 09/14/23 1300 09/14/23 1318 09/15/23 0331  WBC 3.2*  --  3.2*  CREATININE 0.48  --  0.58  LATICACIDVEN  --  0.6  --     Estimated Creatinine Clearance: 66.2 mL/min (by C-G formula based on SCr of 0.58 mg/dL).    No Known Allergies  Antimicrobials this admission: 5/25 ceftriaxone  >>  5/26 vancomycin  >>   Dose adjustments this admission: N/A  Microbiology results: 5/25 BCx: 2/4 GPC (BCID showing staph epi with resistance) 5/25 Sputum: not yet collected  5/25 MRSA PCR: (+)   Thank you for allowing pharmacy to be a part of this patient's care.  Roselee Cong, PharmD Clinical Pharmacist  5/26/20259:22 AM

## 2023-09-15 NOTE — Progress Notes (Signed)
  Echocardiogram 2D Echocardiogram has been performed.  Fain Home RDCS 09/15/2023, 9:56 AM

## 2023-09-16 ENCOUNTER — Telehealth: Payer: Self-pay

## 2023-09-16 DIAGNOSIS — G9341 Metabolic encephalopathy: Secondary | ICD-10-CM | POA: Diagnosis not present

## 2023-09-16 DIAGNOSIS — K219 Gastro-esophageal reflux disease without esophagitis: Secondary | ICD-10-CM | POA: Diagnosis not present

## 2023-09-16 DIAGNOSIS — I5033 Acute on chronic diastolic (congestive) heart failure: Secondary | ICD-10-CM | POA: Diagnosis not present

## 2023-09-16 DIAGNOSIS — G4733 Obstructive sleep apnea (adult) (pediatric): Secondary | ICD-10-CM

## 2023-09-16 LAB — CBC WITH DIFFERENTIAL/PLATELET
Abs Immature Granulocytes: 0.03 10*3/uL (ref 0.00–0.07)
Basophils Absolute: 0 10*3/uL (ref 0.0–0.1)
Basophils Relative: 0 %
Eosinophils Absolute: 0 10*3/uL (ref 0.0–0.5)
Eosinophils Relative: 0 %
HCT: 32.5 % — ABNORMAL LOW (ref 36.0–46.0)
Hemoglobin: 9.7 g/dL — ABNORMAL LOW (ref 12.0–15.0)
Immature Granulocytes: 1 %
Lymphocytes Relative: 9 %
Lymphs Abs: 0.6 10*3/uL — ABNORMAL LOW (ref 0.7–4.0)
MCH: 29.7 pg (ref 26.0–34.0)
MCHC: 29.8 g/dL — ABNORMAL LOW (ref 30.0–36.0)
MCV: 99.4 fL (ref 80.0–100.0)
Monocytes Absolute: 0.3 10*3/uL (ref 0.1–1.0)
Monocytes Relative: 4 %
Neutro Abs: 5.4 10*3/uL (ref 1.7–7.7)
Neutrophils Relative %: 86 %
Platelets: 197 10*3/uL (ref 150–400)
RBC: 3.27 MIL/uL — ABNORMAL LOW (ref 3.87–5.11)
RDW: 15.2 % (ref 11.5–15.5)
WBC: 6.3 10*3/uL (ref 4.0–10.5)
nRBC: 0 % (ref 0.0–0.2)

## 2023-09-16 LAB — GLUCOSE, CAPILLARY
Glucose-Capillary: 156 mg/dL — ABNORMAL HIGH (ref 70–99)
Glucose-Capillary: 174 mg/dL — ABNORMAL HIGH (ref 70–99)
Glucose-Capillary: 126 mg/dL — ABNORMAL HIGH (ref 70–99)
Glucose-Capillary: 176 mg/dL — ABNORMAL HIGH (ref 70–99)
Glucose-Capillary: 195 mg/dL — ABNORMAL HIGH (ref 70–99)

## 2023-09-16 LAB — RENAL FUNCTION PANEL
Albumin: 2.8 g/dL — ABNORMAL LOW (ref 3.5–5.0)
Anion gap: 11 (ref 5–15)
BUN: 25 mg/dL — ABNORMAL HIGH (ref 8–23)
CO2: 38 mmol/L — ABNORMAL HIGH (ref 22–32)
Calcium: 8.9 mg/dL (ref 8.9–10.3)
Chloride: 82 mmol/L — ABNORMAL LOW (ref 98–111)
Creatinine, Ser: 0.77 mg/dL (ref 0.44–1.00)
GFR, Estimated: >60 mL/min (ref >60)
Glucose, Bld: 149 mg/dL — ABNORMAL HIGH (ref 70–99)
Phosphorus: 3.2 mg/dL (ref 2.5–4.6)
Potassium: 3.7 mmol/L (ref 3.5–5.1)
Sodium: 131 mmol/L — ABNORMAL LOW (ref 135–145)

## 2023-09-16 LAB — CULTURE, BLOOD (ROUTINE X 2)

## 2023-09-16 LAB — MAGNESIUM: Magnesium: 2 mg/dL (ref 1.7–2.4)

## 2023-09-16 MED ORDER — PREDNISONE 50 MG PO TABS
60.0000 mg | ORAL_TABLET | Freq: Every day | ORAL | Status: DC
  Administered 2023-09-17 – 2023-09-18 (×2): 60 mg via ORAL
  Filled 2023-09-16 (×2): qty 1

## 2023-09-16 MED ORDER — MUPIROCIN 2 % EX OINT
1.0000 | TOPICAL_OINTMENT | Freq: Two times a day (BID) | CUTANEOUS | Status: DC
  Administered 2023-09-16 – 2023-09-18 (×5): 1 via NASAL
  Filled 2023-09-16 (×2): qty 22

## 2023-09-16 MED ORDER — FUROSEMIDE 40 MG PO TABS
40.0000 mg | ORAL_TABLET | Freq: Every day | ORAL | Status: DC
  Administered 2023-09-17: 40 mg via ORAL
  Filled 2023-09-16: qty 1

## 2023-09-16 MED ORDER — IPRATROPIUM-ALBUTEROL 0.5-2.5 (3) MG/3ML IN SOLN
3.0000 mL | Freq: Three times a day (TID) | RESPIRATORY_TRACT | Status: DC
  Administered 2023-09-17 – 2023-09-18 (×5): 3 mL via RESPIRATORY_TRACT
  Filled 2023-09-16 (×5): qty 3

## 2023-09-16 MED ORDER — HYDRALAZINE HCL 25 MG PO TABS
25.0000 mg | ORAL_TABLET | Freq: Three times a day (TID) | ORAL | Status: DC
  Administered 2023-09-16 – 2023-09-18 (×6): 25 mg via ORAL
  Filled 2023-09-16 (×6): qty 1

## 2023-09-16 MED ORDER — CEFUROXIME AXETIL 500 MG PO TABS
500.0000 mg | ORAL_TABLET | Freq: Two times a day (BID) | ORAL | Status: DC
  Administered 2023-09-16 – 2023-09-18 (×5): 500 mg via ORAL
  Filled 2023-09-16 (×7): qty 1

## 2023-09-16 NOTE — Telephone Encounter (Signed)
 Copied from CRM [id number]. Topic: Clinical - Request for Lab/Test Order >> Sep 16, 2023 11:21 AM Hilton Lucky wrote: Reason for CRM: Patient would like an order for a sleep study placed. States she is not walking and will need mobility assistance to conduct study.  Please advise Dr. Gaynell Keeler

## 2023-09-16 NOTE — Progress Notes (Signed)
 Heart Failure Navigator Progress Note  Assessed for Heart & Vascular TOC clinic readiness.  Patient does not meet criteria due to EF 55-60%, per MD note with memory impairment, will return to SNF at discharge. No HF TOC. .   Navigator will sign off at this time.   Randie Bustle, BSN, Scientist, clinical (histocompatibility and immunogenetics) Only

## 2023-09-16 NOTE — Progress Notes (Addendum)
 PROGRESS NOTE    Elizabeth [name]  ZOX:[id number] DOB: [birth year] DOA: 09/14/2023 PCP: Shari Daughters, MD   Chief Complaint  Patient presents with   Altered Mental Status    Brief Narrative:  Patient is a 83 year old female history of paroxysmal A-fib on chronic anticoagulation, chronic HFpEF, hypertension, hyperlipidemia, type 2 diabetes, OSA noncompliant with CPAP, GERD, CKD, memory impairment presenting to the ED with altered mental status/confusion, lethargy.  Workup concerning for an acute reactive airway disease exacerbation as patient noted to have elevated pCO2 in the 120s on presentation with a pH of 7.2.  Also concern was for volume overload/acute on chronic CHF exacerbation.  Patient treated for an acute reactive airways exacerbation, CHF exacerbation.  Preliminary blood cultures concerning for possible bacteremia however likely contaminant.  Patient on empiric antibiotics.   Assessment & Plan:   Principal Problem:   Acute metabolic encephalopathy Active Problems:   Reactive airway disease with acute exacerbation   Acute on chronic diastolic CHF (congestive heart failure) (HCC)   Chronic venous insufficiency   Type 2 diabetes mellitus (HCC)   Hypertension associated with diabetes (HCC)   GERD (gastroesophageal reflux disease)   Diabetes mellitus without complication (HCC)   OSA (obstructive sleep apnea)   (HFpEF) heart failure with preserved ejection fraction (HCC)   Morbid obesity (HCC)   Hypomagnesemia   Hypophosphatemia  #1 acute metabolic encephalopathy -Likely multifactorial secondary to hypercarbia as noted on VBG with a pH of 7.2/PCO2 of 120/PO2 of 58/bicarb of 51.  Patient also with concerns of volume overload and possible bacteremia. - Patient noted on presentation per history from daughter and EDP that patient had a weeklong history of lethargy, altered mental status, decreased appetite. - Patient placed on BiPAP with clinical improvement.  - Chest x-ray  done negative for any acute infiltrate. - CT head done negative for any acute abnormalities. -CT abdomen and pelvis with moderate pleural effusions, slightly increased on the left since prior study, bilateral nephrolithiasis without hydronephrosis, colonic diverticulosis, cholelithiasis, 1.3 cm right adrenal nodule. - Urinalysis unremarkable. -Blood cultures with 2/4 bottles with gram-positive cocci with BCID of MRSE. -Second set of blood cultures negative likely contaminant. - Continue BiPAP as needed and nightly. -Continue scheduled DuoNebs, Pulmicort, Brovana, Flonase , PPI. -Transition from IV steroids to oral prednisone taper. - Transition from IV Rocephin  to Ceftin. - Discontinue vancomycin .   - Supportive care.    2.  Reactive airway disease with exacerbation -Patient with no diagnosed history of COPD however patient did endorse prior history of tobacco use. - VBG with a respiratory acidosis with a pH of 7.24, pCO2 of 120. - Chest x-ray no acute infiltrate. -Respiratory viral panel negative.  Sputum Gram stain and cultures pending. -RSV by PCR negative, COVID-19 PCR negative, influenza A and B PCR negative.  -Clinical improvement on BiPAP. -Now on BiPAP nightly. - Patient placed on BiPAP with clinical improvement. -Transition from IV Solu-Medrol  to oral prednisone taper. -Transition from IV Rocephin  to Ceftin for 3 more days to complete a 5-day course of antibiotic treatment. - Continue Pulmicort, Brovana, scheduled DuoNebs, PPI, Flonase , Claritin.  3.  ??  Bacteremia -2/4 bottles preliminary results with GPC, MRSE.  BCID with sensitivities pending. -Second set of blood cultures with no growth. -Likely contaminant. -Discontinue IV vancomycin .   4.  Acute on chronic diastolic CHF exacerbation -Chest x-ray with concern for pulmonary edema. - BNP noted to be elevated at 328 on admission. - High-sensitivity troponins seem flattened and plateaued at 29 >>> 31 >>>  30.   - 2D echo  with EF of 55 to 60%, and WMA, moderate concentric LVH, grade 1 DD.   - Patient with urine output of 1.850 L over the past 24 hours.   - Transition from IV Lasix to oral Lasix 40 mg p.o. daily to start tomorrow 09/17/2023.  - Continue ARB and Lopressor . - Strict I's and O's, daily weights. -Will need outpatient follow-up with cardiology postdischarge.   5.  Hypertension - Continue home regimen ARB.   -Lopressor  25 mg p.o. twice daily started on 09/15/2023. - Start Lopressor  25 mg twice daily.  -Transition from IV Lasix to oral Lasix 40 mg daily. -Start hydralazine  25 mg 3 times daily.  6.  Hypomagnesemia/hypophosphatemia -Magnesium  at 2.0 from 1.5. -Phosphorus at 3.2 from 2.2. -Repeat labs in the AM.   7.  Well-controlled diabetes mellitus type 2 - Hemoglobin A1c 4.5.  - Patient noted to be on Glucophage and Jardiance prior to admission. -CBG 156 this morning. - Continue to hold oral hypoglycemic agents. -Continue home regimen Neurontin. - SSI.   8.  Mild memory impairment - Aricept .     9.  GERD -PPI.   10.  Hyperlipidemia - Continue statin.    11.  History of constipation - Continue Senokot twice daily.   12 OSA -Patient supposed to be on CPAP with daughter however noncompliant per daughter. - Daughter states patient chronically on home O2 unsure of how much. -Check ambulatory sats. - BiPAP nightly while in-house and on discharge can go back to home regimen of CPAP nightly..   13.  Morbid obesity -Lifestyle modification - Outpatient follow-up with PCP.   DVT prophylaxis: Eliquis  Code Status: DNR Family Communication: Updated patient.  No family at bedside. Disposition: Transfer to progressive care floor.  Likely back to nursing home when clinically improved.  Status is: Inpatient The patient will require care spanning > 2 midnights and should be moved to inpatient because: Severity of illness   Consultants:  None  Procedures:  CT head 09/14/2023 Chest  x-ray 09/14/2023 2D echo 09/15/2023 CT chest abdomen and pelvis 09/14/2023  Antimicrobials:  Anti-infectives (From admission, onward)    Start     Dose/Rate Route Frequency Ordered Stop   09/16/23 1015  Vancomycin  (VANCOCIN ) 1,250 mg in sodium chloride  0.9 % 250 mL IVPB  Status:  Discontinued       Placed in "Followed by" Linked Group   1,250 mg 166.7 mL/hr over 90 Minutes Intravenous Every 24 hours 09/15/23 0925 09/15/23 0928   09/16/23 1000  Vancomycin  (VANCOCIN ) 1,250 mg in sodium chloride  0.9 % 250 mL IVPB        1,250 mg 166.7 mL/hr over 90 Minutes Intravenous Every 24 hours 09/15/23 0929     09/15/23 1030  vancomycin  (VANCOCIN ) IVPB 1000 mg/200 mL premix        1,000 mg 200 mL/hr over 60 Minutes Intravenous Every hour 09/15/23 0929 09/15/23 1218   09/15/23 1015  vancomycin  (VANCOREADY) IVPB 2000 mg/400 mL  Status:  Discontinued       Placed in "Followed by" Linked Group   2,000 mg 200 mL/hr over 120 Minutes Intravenous  Once 09/15/23 0925 09/15/23 0928   09/14/23 1815  cefTRIAXone  (ROCEPHIN ) 2 g in sodium chloride  0.9 % 100 mL IVPB        2 g 200 mL/hr over 30 Minutes Intravenous Every 24 hours 09/14/23 1712 09/19/23 1814         Subjective: Patient laying in bed just finished eating  breakfast.  Denies any chest pain or shortness of breath.  No abdominal pain.  Mental status improved.  Overall feels well.  Tolerated BiPAP overnight.  Asking when she is going to be able to go home.   Objective: Vitals:   09/16/23 0600 09/16/23 0700 09/16/23 0746 09/16/23 0751  BP: (!) 151/64 (!) 160/58    Pulse: 63 61  61  Resp: 18 (!) 21  20  Temp:    98.2 F (36.8 C)  TempSrc:    Oral  SpO2: 99% 100% 98% 100%  Weight:        Intake/Output Summary (Last 24 hours) at 09/16/2023 0927 Last data filed at 09/16/2023 0600 Gross per 24 hour  Intake 600.31 ml  Output 1850 ml  Net -1249.69 ml   Filed Weights   09/14/23 1754 09/15/23 0500 09/16/23 0315  Weight: 117.2 kg 114.6 kg 115.3 kg     Examination:  General exam: NAD Respiratory system: Lungs clear to auscultation bilaterally.  No wheezing, no crackles, no rhonchi.  Fair air movement.  Speaking in full sentences.  Cardiovascular system: RRR no murmurs rubs or gallops.  No JVD.  No pitting lower extremity edema. Gastrointestinal system: Abdomen is soft, nontender, nondistended, positive bowel sounds.  No rebound.  No guarding. Central nervous system: Alert and oriented. No focal neurological deficits. Extremities: Symmetric 5 x 5 power. Skin: No rashes, lesions or ulcers Psychiatry: Judgement and insight appear fair. Mood & affect appropriate.     Data Reviewed: I have personally reviewed following labs and imaging studies  CBC: Recent Labs  Lab 09/14/23 1300 09/15/23 0331 09/16/23 0303  WBC 3.2* 3.2* 6.3  NEUTROABS 2.1  --  5.4  HGB 9.9* 10.7* 9.7*  HCT 36.2 38.2 32.5*  MCV 105.5* 103.5* 99.4  PLT 171 193 197    Basic Metabolic Panel: Recent Labs  Lab 09/14/23 1300 09/15/23 0331 09/16/23 0303  NA 141 141 131*  K 4.9 4.3 3.7  CL 91* 89* 82*  CO2 37* 36* 38*  GLUCOSE 84 102* 149*  BUN 12 17 25*  CREATININE 0.48 0.58 0.77  CALCIUM  9.3 9.7 8.9  MG 1.7 1.5* 2.0  PHOS  --  2.2* 3.2    GFR: Estimated Creatinine Clearance: 66.4 mL/min (by C-G formula based on SCr of 0.77 mg/dL).  Liver Function Tests: Recent Labs  Lab 09/14/23 1300 09/16/23 0303  AST 13*  --   ALT 8  --   ALKPHOS 51  --   BILITOT 0.5  --   PROT 6.5  --   ALBUMIN 2.8* 2.8*    CBG: Recent Labs  Lab 09/15/23 0825 09/15/23 1137 09/15/23 1532 09/15/23 2107 09/16/23 0805  GLUCAP 111* 149* 168* 152* 156*     Recent Results (from the past 240 hours)  Blood Culture (routine x 2)     Status: None (Preliminary result)   Collection Time: 09/14/23  1:00 PM   Specimen: BLOOD  Result Value Ref Range Status   Specimen Description   Final    BLOOD BLOOD LEFT HAND Performed at [facility], [address], [place], [place] [address]    Special Requests   Final    BOTTLES DRAWN AEROBIC AND ANAEROBIC Blood Culture results may not be optimal due to an inadequate volume of blood received in culture bottles Performed at [facility], [address], [place], [place] [address]    Culture   Final    NO GROWTH 2 DAYS Performed  at [facility], [address], [place], [place] [address]    Report Status PENDING  Incomplete  Blood Culture (routine x 2)     Status: None (Preliminary result)   Collection Time: 09/14/23  1:00 PM   Specimen: BLOOD  Result Value Ref Range Status   Specimen Description   Final    BLOOD BLOOD RIGHT ARM Performed at [facility], [address] [place], [place] [address]    Special Requests   Final    BOTTLES DRAWN AEROBIC AND ANAEROBIC Blood Culture results may not be optimal due to an inadequate volume of blood received in culture bottles Performed at [facility], [address], [place], [place] [address]    Culture  Setup Time   Final    GRAM POSITIVE COCCI IN BOTH AEROBIC AND ANAEROBIC BOTTLES Organism ID to follow CRITICAL RESULT CALLED TO, READ BACK BY AND VERIFIED WITH: Jesslyn Moro PHARMD, AT 0845 09/15/23 D. VANHOOK Performed at [facility], [address], [place], [place] [address]    Culture GRAM POSITIVE COCCI  Final   Report Status PENDING  Incomplete  Blood Culture ID Panel (Reflexed)     Status: Abnormal   Collection Time: 09/14/23  1:00 PM  Result Value Ref Range Status   Enterococcus faecalis NOT DETECTED NOT DETECTED Final   Enterococcus Faecium NOT DETECTED NOT DETECTED Final   Listeria monocytogenes NOT DETECTED NOT DETECTED Final   Staphylococcus species DETECTED (A) NOT DETECTED Final    Comment: CRITICAL RESULT CALLED TO, READ BACK BY AND VERIFIED WITH: JArmida Berry PHARMD, AT 0845 09/15/23 D. VANHOOK    Staphylococcus aureus (BCID) NOT DETECTED NOT DETECTED Final    Staphylococcus epidermidis DETECTED (A) NOT DETECTED Final    Comment: Methicillin (oxacillin) resistant coagulase negative staphylococcus. Possible blood culture contaminant (unless isolated from more than one blood culture draw or clinical case suggests pathogenicity). No antibiotic treatment is indicated for blood  culture contaminants. CRITICAL RESULT CALLED TO, READ BACK BY AND VERIFIED WITH: Jesslyn Moro PHARMD, AT 0845 09/15/23 D. VANHOOK    Staphylococcus lugdunensis NOT DETECTED NOT DETECTED Final   Streptococcus species NOT DETECTED NOT DETECTED Final   Streptococcus agalactiae NOT DETECTED NOT DETECTED Final   Streptococcus pneumoniae NOT DETECTED NOT DETECTED Final   Streptococcus pyogenes NOT DETECTED NOT DETECTED Final   A.calcoaceticus-baumannii NOT DETECTED NOT DETECTED Final   Bacteroides fragilis NOT DETECTED NOT DETECTED Final   Enterobacterales NOT DETECTED NOT DETECTED Final   Enterobacter cloacae complex NOT DETECTED NOT DETECTED Final   Escherichia coli NOT DETECTED NOT DETECTED Final   Klebsiella aerogenes NOT DETECTED NOT DETECTED Final   Klebsiella oxytoca NOT DETECTED NOT DETECTED Final   Klebsiella pneumoniae NOT DETECTED NOT DETECTED Final   Proteus species NOT DETECTED NOT DETECTED Final   Salmonella species NOT DETECTED NOT DETECTED Final   Serratia marcescens NOT DETECTED NOT DETECTED Final   Haemophilus influenzae NOT DETECTED NOT DETECTED Final   Neisseria meningitidis NOT DETECTED NOT DETECTED Final   Pseudomonas aeruginosa NOT DETECTED NOT DETECTED Final   Stenotrophomonas maltophilia NOT DETECTED NOT DETECTED Final   Candida albicans NOT DETECTED NOT DETECTED Final   Candida auris NOT DETECTED NOT DETECTED Final   Candida glabrata NOT DETECTED NOT DETECTED Final   Candida krusei NOT DETECTED NOT DETECTED Final   Candida parapsilosis NOT DETECTED NOT DETECTED Final   Candida tropicalis NOT DETECTED NOT DETECTED Final   Cryptococcus neoformans/gattii NOT  DETECTED NOT DETECTED Final   Methicillin resistance mecA/C DETECTED (A) NOT DETECTED Final    Comment: CRITICAL RESULT CALLED TO, READ BACK BY AND VERIFIED WITH: Jesslyn Moro PHARMD, AT 0845 09/15/23 D. VANHOOK Performed at [facility], [address], [place], [place] [address]   MRSA Next Gen by PCR, Nasal     Status: Abnormal   Collection Time: 09/14/23  5:56 PM   Specimen: Nasal Mucosa; Nasal Swab  Result Value Ref Range Status   MRSA by PCR Next Gen DETECTED (A) NOT DETECTED Final    Comment: RESULT CALLED TO, READ BACK BY AND VERIFIED WITH: Gisele Lamas, RN 2312 09/14/23 BY Gearld Keep (NOTE) The GeneXpert MRSA Assay (FDA approved for NASAL specimens only), is one component of a comprehensive MRSA colonization surveillance program. It is not intended to diagnose MRSA infection nor to guide or monitor treatment for MRSA infections. Test performance is not FDA approved in patients less than 68 years old. Performed at [facility], [address], [place], [place] [address]   Respiratory (~20 pathogens) panel by PCR     Status: None   Collection Time: 09/15/23  4:12 PM   Specimen: Nasopharyngeal Swab; Respiratory  Result Value Ref Range Status   Adenovirus NOT DETECTED NOT DETECTED Final   Coronavirus 229E NOT DETECTED NOT DETECTED Final    Comment: (NOTE) The Coronavirus on the Respiratory Panel, DOES NOT test for the novel  Coronavirus (2019 nCoV)    Coronavirus HKU1 NOT DETECTED NOT DETECTED Final   Coronavirus NL63 NOT DETECTED NOT DETECTED Final   Coronavirus OC43 NOT DETECTED NOT DETECTED Final   Metapneumovirus NOT DETECTED NOT DETECTED Final   Rhinovirus / Enterovirus NOT DETECTED NOT DETECTED Final   Influenza A NOT DETECTED NOT DETECTED Final   Influenza B NOT DETECTED NOT DETECTED Final   Parainfluenza Virus 1 NOT DETECTED NOT DETECTED Final   Parainfluenza Virus 2 NOT DETECTED NOT DETECTED Final   Parainfluenza Virus 3 NOT DETECTED NOT DETECTED  Final   Parainfluenza Virus 4 NOT DETECTED NOT DETECTED Final   Respiratory Syncytial Virus NOT DETECTED NOT DETECTED Final   Bordetella pertussis NOT DETECTED NOT DETECTED Final   Bordetella Parapertussis NOT DETECTED NOT DETECTED Final   Chlamydophila pneumoniae NOT DETECTED NOT DETECTED Final   Mycoplasma pneumoniae NOT DETECTED NOT DETECTED Final    Comment: Performed at [facility], [address], [place], [place] [address]  Resp panel by RT-PCR (RSV, Flu A&B, Covid) Anterior Nasal Swab     Status: None   Collection Time: 09/15/23  4:12 PM   Specimen: Anterior Nasal Swab  Result Value Ref Range Status   SARS Coronavirus 2 by RT PCR NEGATIVE NEGATIVE Final    Comment: (NOTE) SARS-CoV-2 target nucleic acids are NOT DETECTED.  The SARS-CoV-2 RNA is generally detectable in upper respiratory specimens during the acute phase of infection. The lowest concentration of SARS-CoV-2 viral copies this assay can detect is 138 copies/mL. A negative result does not preclude SARS-Cov-2 infection and should not be used as the sole basis for treatment or other patient management decisions. A negative result may occur with  improper specimen collection/handling, submission of specimen other than nasopharyngeal swab, presence of viral mutation(s) within the areas targeted by this assay, and inadequate number of viral copies(<138 copies/mL). A negative result must be combined with clinical observations, patient history, and epidemiological information. The expected result is Negative.  Fact Sheet for Patients:  BloggerCourse.com  Fact  Sheet for Healthcare Providers:  SeriousBroker.it  This test is no t yet approved or cleared by the [place]  FDA and  has been authorized for detection and/or diagnosis of SARS-CoV-2 by FDA under an Emergency Use Authorization (EUA). This EUA will remain  in effect (meaning this test can be used) for the  duration of the COVID-19 declaration under Section 564(b)(1) of the Act, 21 U.S.C.section 360bbb-3(b)(1), unless the authorization is terminated  or revoked sooner.       Influenza A by PCR NEGATIVE NEGATIVE Final   Influenza B by PCR NEGATIVE NEGATIVE Final    Comment: (NOTE) The Xpert Xpress SARS-CoV-2/FLU/RSV plus assay is intended as an aid in the diagnosis of influenza from Nasopharyngeal swab specimens and should not be used as a sole basis for treatment. Nasal washings and aspirates are unacceptable for Xpert Xpress SARS-CoV-2/FLU/RSV testing.  Fact Sheet for Patients: BloggerCourse.com  Fact Sheet for Healthcare Providers: SeriousBroker.it  This test is not yet approved or cleared by the [place]  FDA and has been authorized for detection and/or diagnosis of SARS-CoV-2 by FDA under an Emergency Use Authorization (EUA). This EUA will remain in effect (meaning this test can be used) for the duration of the COVID-19 declaration under Section 564(b)(1) of the Act, 21 U.S.C. section 360bbb-3(b)(1), unless the authorization is terminated or revoked.     Resp Syncytial Virus by PCR NEGATIVE NEGATIVE Final    Comment: (NOTE) Fact Sheet for Patients: BloggerCourse.com  Fact Sheet for Healthcare Providers: SeriousBroker.it  This test is not yet approved or cleared by the [place]  FDA and has been authorized for detection and/or diagnosis of SARS-CoV-2 by FDA under an Emergency Use Authorization (EUA). This EUA will remain in effect (meaning this test can be used) for the duration of the COVID-19 declaration under Section 564(b)(1) of the Act, 21 U.S.C. section 360bbb-3(b)(1), unless the authorization is terminated or revoked.  Performed at [facility], [address] [place], [place] [address]          Radiology Studies: ECHOCARDIOGRAM  COMPLETE Result Date: 09/15/2023    ECHOCARDIOGRAM REPORT   Patient Name:   Elizabeth [name] Date of Exam: 09/15/2023 Medical Rec #:  [id number]       Height:       64.0 in Accession #:    [id number]      Weight:       252.6 lb Date of Birth:  08/28/1940        BSA:          2.161 m Patient Age:    83 years        BP:           198/64 mmHg Patient Gender: F               HR:           79 bpm. Exam Location:  Inpatient Procedure: 2D Echo, Color Doppler and Cardiac Doppler (Both Spectral and Color            Flow Doppler were utilized during procedure). Indications:    CHF Acute Diastolic I50.31  History:        Patient has prior history of Echocardiogram examinations, most                 recent 01/10/2023. CHF, Arrythmias:Atrial Fibrillation; Risk                 Factors:Diabetes and Hypertension.  Sonographer:  Hersey Lorenzo RDCS Referring Phys: [id number] Paitynn Mikus V Zamyiah Tino IMPRESSIONS  1. Left ventricular ejection fraction, by estimation, is 55 to 60%. The left ventricle has normal function. The left ventricle has no regional wall motion abnormalities. There is moderate concentric left ventricular hypertrophy. Left ventricular diastolic parameters are consistent with Grade I diastolic dysfunction (impaired relaxation).  2. Right ventricular systolic function is mildly reduced. The right ventricular size is mildly enlarged. Tricuspid regurgitation signal is inadequate for assessing PA pressure.  3. Left atrial size was mildly dilated.  4. The mitral valve is degenerative. Trivial mitral valve regurgitation. No evidence of mitral stenosis. Moderate mitral annular calcification.  5. The aortic valve is tricuspid. There is moderate calcification of the aortic valve. Aortic valve regurgitation is not visualized. Aortic valve sclerosis/calcification is present, without any evidence of aortic stenosis.  6. The inferior vena cava is dilated in size with >50% respiratory variability, suggesting right atrial pressure of 8 mmHg.  FINDINGS  Left Ventricle: Left ventricular ejection fraction, by estimation, is 55 to 60%. The left ventricle has normal function. The left ventricle has no regional wall motion abnormalities. The left ventricular internal cavity size was normal in size. There is  moderate concentric left ventricular hypertrophy. Left ventricular diastolic parameters are consistent with Grade I diastolic dysfunction (impaired relaxation). Right Ventricle: The right ventricular size is mildly enlarged. Right vetricular wall thickness was not well visualized. Right ventricular systolic function is mildly reduced. Tricuspid regurgitation signal is inadequate for assessing PA pressure. Left Atrium: Left atrial size was mildly dilated. Right Atrium: Right atrial size was normal in size. Pericardium: There is no evidence of pericardial effusion. Mitral Valve: The mitral valve is degenerative in appearance. There is moderate calcification of the mitral valve leaflet(s). Moderate mitral annular calcification. Trivial mitral valve regurgitation. No evidence of mitral valve stenosis. Tricuspid Valve: The tricuspid valve is normal in structure. Tricuspid valve regurgitation is not demonstrated. Aortic Valve: The aortic valve is tricuspid. There is moderate calcification of the aortic valve. Aortic valve regurgitation is not visualized. Aortic valve sclerosis/calcification is present, without any evidence of aortic stenosis. Pulmonic Valve: The pulmonic valve was not well visualized. Pulmonic valve regurgitation is not visualized. Aorta: The aortic root is normal in size and structure. Venous: The inferior vena cava is dilated in size with greater than 50% respiratory variability, suggesting right atrial pressure of 8 mmHg. IAS/Shunts: No atrial level shunt detected by color flow Doppler.  LEFT VENTRICLE PLAX 2D LVIDd:         4.90 cm   Diastology LVIDs:         3.90 cm   LV e' medial:    4.24 cm/s LV PW:         1.30 cm   LV E/e' medial:  21.8  LV IVS:        1.10 cm   LV e' lateral:   6.64 cm/s LVOT diam:     2.00 cm   LV E/e' lateral: 13.9 LV SV:         92 LV SV Index:   42 LVOT Area:     3.14 cm  RIGHT VENTRICLE            IVC RV S prime:     9.25 cm/s  IVC diam: 2.70 cm TAPSE (M-mode): 1.4 cm LEFT ATRIUM             Index        RIGHT ATRIUM  Index LA diam:        3.30 cm 1.53 cm/m   RA Area:     15.00 cm LA Vol (A2C):   80.3 ml 37.16 ml/m  RA Volume:   41.60 ml  19.25 ml/m LA Vol (A4C):   68.1 ml 31.52 ml/m LA Biplane Vol: 76.6 ml 35.45 ml/m  AORTIC VALVE LVOT Vmax:   139.00 cm/s LVOT Vmean:  98.900 cm/s LVOT VTI:    0.292 m  AORTA Ao Root diam: 2.60 cm MITRAL VALVE MV Area (PHT): 3.26 cm     SHUNTS MV Decel Time: 233 msec     Systemic VTI:  0.29 m MV E velocity: 92.40 cm/s   Systemic Diam: 2.00 cm MV A velocity: 120.00 cm/s MV E/A ratio:  0.77 Dalton McleanMD Electronically signed by Archer Bear Signature Date/Time: 09/15/2023/10:00:31 AM    Final    CT CHEST ABDOMEN PELVIS WO CONTRAST Result Date: 09/14/2023 CLINICAL DATA:  Sepsis EXAM: CT CHEST, ABDOMEN AND PELVIS WITHOUT CONTRAST TECHNIQUE: Multidetector CT imaging of the chest, abdomen and pelvis was performed following the standard protocol without IV contrast. RADIATION DOSE REDUCTION: This exam was performed according to the departmental dose-optimization program which includes automated exposure control, adjustment of the mA and/or kV according to patient size and/or use of iterative reconstruction technique. COMPARISON:  04/23/2023 FINDINGS: CT CHEST FINDINGS Cardiovascular: Heart size normal. No pericardial effusion. Dilated central pulmonary arteries, suggesting pulmonary hypertension. Scattered coronary and aortic calcified plaque. No TAA. Mediastinum/Nodes: No mediastinal hematoma, mass, or adenopathy. Lungs/Pleura: Moderate pleural effusions, slightly increased on the left since prior study. Dependent atelectasis posteriorly, left worse than right.  Musculoskeletal: Vertebral endplate spurring at multiple levels in the mid and lower thoracic spine. Advanced bilateral shoulder DJD. CT ABDOMEN PELVIS FINDINGS Hepatobiliary: Subcapsular cystic lesion with peripheral coarse calcification in segment 8 is stable. No new liver lesion or biliary ductal dilatation. Small partially calcified gallstones layer in the dependent aspect of the nondilated gallbladder. Pancreas: Unremarkable. No pancreatic ductal dilatation or surrounding inflammatory changes. Spleen: Normal in size without focal abnormality. Adrenals/Urinary Tract: 1.3 cm right adrenal nodule, previously 1.2 cm. Symmetric renal contours without hydronephrosis. Bilateral urolithiasis, largest stone peripherally on the left in the lower pole 9 mm, on the right 5 mm in the lower pole. Urinary bladder nondistended. Stomach/Bowel: Stomach is nondistended. Small bowel decompressed. Normal appendix. The colon is nondistended, with scattered diverticula most numerous in the distal descending and proximal sigmoid segments, with regional wall thickening but no significant adjacent inflammatory change. Vascular/Lymphatic: Mild scattered aortoiliac calcified plaque without AAA. No abdominal or pelvic adenopathy. Reproductive: Status post hysterectomy. No adnexal masses. Other: Pelvic phleboliths.  No ascites.  No free air. Musculoskeletal: Small paraumbilical hernia containing only mesenteric fat. Multilevel lumbar spondylitic change with stable grade 1 anterolisthesis L4-5. Hip DJD worse left than right. IMPRESSION: 1. Moderate pleural effusions, slightly increased on the left since prior study. 2. Bilateral nephrolithiasis without hydronephrosis. 3. Colonic diverticulosis. 4. Cholelithiasis. 5. 1.3 cm right adrenal nodule, previously 1.2 cm. 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 Consider 12 month follow-up adrenal protocol CT. 6. Coronary and aortic Atherosclerosis (ICD10-I70.0). Electronically  Signed   By: Nicoletta Barrier M.D.   On: 09/14/2023 14:19   CT Head Wo Contrast Result Date: 09/14/2023 CLINICAL DATA:  Mental status change, unknown cause, dementia EXAM: CT HEAD WITHOUT CONTRAST TECHNIQUE: Contiguous axial images were obtained from the base of the skull through the vertex without intravenous contrast. RADIATION DOSE REDUCTION:  This exam was performed according to the departmental dose-optimization program which includes automated exposure control, adjustment of the mA and/or kV according to patient size and/or use of iterative reconstruction technique. COMPARISON:  10/14/2022, 08/06/2022 FINDINGS: Brain: Minor periventricular white matter microvascular ischemic changes bilaterally. No acute intracranial hemorrhage new infarction, mass lesion, midline shift, herniation, hydrocephalus, or extra-axial fluid collection. No focal mass effect or edema. Cisterns are patent. No cerebellar abnormality. Vascular: No hyperdense vessel or unexpected calcification. Skull: No acute osseous finding fracture. Left mastoid effusion noted. Right mastoid is clear. Sinuses/Orbits: Chronic complete opacification of the right maxillary sinus with surrounding maxillary wall hyperostosis as well as partially calcified soft tissue protrusion into the nasal cavity. No orbital abnormality. Other: None. IMPRESSION: 1. No acute intracranial abnormality by noncontrast CT. 2. Minor white matter microvascular ischemic changes. 3. 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 4. Left mastoid effusion. Electronically Signed   By: Melven Stable.  Shick M.D.   On: 09/14/2023 14:19   DG Chest Port 1 View Result Date: 09/14/2023 CLINICAL DATA:  Questionable sepsis. EXAM: PORTABLE CHEST 1 VIEW COMPARISON:  05/12/2021 FINDINGS: The cardiomediastinal contours are unremarkable. There is asymmetric veil like opacification over the left mid and left lower lung.  Mild thickening of the minor fissure within the right midlung is noted with blunting of the right costophrenic angle. No airspace consolidation identified. IMPRESSION: 1. Asymmetric veil like opacification over the left mid and left lower lung compatible with layering pleural effusion. 2. Blunting of the right costophrenic angle compatible with small right pleural effusion. Electronically Signed   By: Kimberley Penman M.D.   On: 09/14/2023 13:55        Scheduled Meds:  apixaban   5 mg Oral BID   arformoterol  15 mcg Nebulization BID   budesonide (PULMICORT) nebulizer solution  0.5 mg Nebulization Q12H   cetirizine  10 mg Oral Daily   Chlorhexidine  Gluconate Cloth  6 each Topical Daily   cyanocobalamin   1,000 mcg Oral Daily   diclofenac Sodium  2 g Topical QID   donepezil   5 mg Oral QHS   fluticasone   1 spray Each Nare BID   [START ON 09/17/2023] furosemide  40 mg Oral Daily   gabapentin  100 mg Oral Daily   hydrALAZINE   25 mg Oral Q8H   insulin  aspart  0-15 Units Subcutaneous TID WC   ipratropium-albuterol   3 mL Nebulization Q6H   lidocaine   1 patch Transdermal QHS   losartan   100 mg Oral Daily   methylPREDNISolone  (SOLU-MEDROL ) injection  60 mg Intravenous Q12H   Followed by   Cecily Cohen ON 09/17/2023] predniSONE  40 mg Oral Q breakfast   metoprolol  tartrate  25 mg Oral BID   mouth rinse  15 mL Mouth Rinse 4 times per day   oxybutynin   10 mg Oral Daily   pantoprazole   40 mg Oral Daily   phosphorus  250 mg Oral BID   potassium chloride  10 mEq Oral Daily   rosuvastatin   5 mg Oral Daily   senna  2 tablet Oral Daily   sodium chloride  flush  3 mL Intravenous Q12H   sodium chloride  flush  3 mL Intravenous Q12H   Continuous Infusions:  cefTRIAXone  (ROCEPHIN )  IV Stopped (09/15/23 1915)   vancomycin        LOS: 1 day    Time spent: 40 minutes    Hilda Lovings, MD [facility]   To contact  the attending provider between 7A-7P or the covering provider during after hours  7P-7A, please log into the web site [email or web address] and access using universal [place] password for that web site. If you do not have the password, please call the hospital operator.  09/16/2023, 9:27 AM

## 2023-09-17 DIAGNOSIS — I10 Essential (primary) hypertension: Secondary | ICD-10-CM

## 2023-09-17 DIAGNOSIS — I5033 Acute on chronic diastolic (congestive) heart failure: Secondary | ICD-10-CM | POA: Diagnosis not present

## 2023-09-17 DIAGNOSIS — J9602 Acute respiratory failure with hypercapnia: Secondary | ICD-10-CM | POA: Diagnosis not present

## 2023-09-17 DIAGNOSIS — J45901 Unspecified asthma with (acute) exacerbation: Secondary | ICD-10-CM | POA: Diagnosis not present

## 2023-09-17 DIAGNOSIS — G9341 Metabolic encephalopathy: Secondary | ICD-10-CM | POA: Diagnosis not present

## 2023-09-17 LAB — CBC
HCT: 32.3 % — ABNORMAL LOW (ref 36.0–46.0)
Hemoglobin: 9.8 g/dL — ABNORMAL LOW (ref 12.0–15.0)
MCH: 29.1 pg (ref 26.0–34.0)
MCHC: 30.3 g/dL (ref 30.0–36.0)
MCV: 95.8 fL (ref 80.0–100.0)
Platelets: 200 10*3/uL (ref 150–400)
RBC: 3.37 MIL/uL — ABNORMAL LOW (ref 3.87–5.11)
RDW: 15.7 % — ABNORMAL HIGH (ref 11.5–15.5)
WBC: 9.6 10*3/uL (ref 4.0–10.5)
nRBC: 0 % (ref 0.0–0.2)

## 2023-09-17 LAB — RENAL FUNCTION PANEL
Albumin: 2.9 g/dL — ABNORMAL LOW (ref 3.5–5.0)
Anion gap: 8 (ref 5–15)
BUN: 35 mg/dL — ABNORMAL HIGH (ref 8–23)
CO2: 42 mmol/L — ABNORMAL HIGH (ref 22–32)
Calcium: 9.2 mg/dL (ref 8.9–10.3)
Chloride: 81 mmol/L — ABNORMAL LOW (ref 98–111)
Creatinine, Ser: 0.88 mg/dL (ref 0.44–1.00)
GFR, Estimated: >60 mL/min (ref >60)
Glucose, Bld: 113 mg/dL — ABNORMAL HIGH (ref 70–99)
Phosphorus: 4.1 mg/dL (ref 2.5–4.6)
Potassium: 3.8 mmol/L (ref 3.5–5.1)
Sodium: 131 mmol/L — ABNORMAL LOW (ref 135–145)

## 2023-09-17 LAB — GLUCOSE, CAPILLARY
Glucose-Capillary: 119 mg/dL — ABNORMAL HIGH (ref 70–99)
Glucose-Capillary: 125 mg/dL — ABNORMAL HIGH (ref 70–99)
Glucose-Capillary: 171 mg/dL — ABNORMAL HIGH (ref 70–99)
Glucose-Capillary: 202 mg/dL — ABNORMAL HIGH (ref 70–99)

## 2023-09-17 NOTE — Progress Notes (Signed)
 Pt received up in recliner requesting return to bed. Pt required MaxAx2 to raise to standing in [place] Lift, MaxA to attain upright standing in order to lower seat pads. Pt assisted to EOB requiring MaxA to lower to sitting due to poor eccentric control. MaxAx2 sit to supine and boosting up in bed. Pt positioned to comfort with all needs in reach, declined further activity. Continue PT per POC.    09/17/23 1500  PT Visit Information  Assistance Needed +2  History of Present Illness Patient is a 83 yo female presents to therapy s/p hospital admission on 09/14/2023 due to AMS. Pt was found to have acute metabolic encephalopathy, reactive airway dz requiring BiPAP and acute on chronic dCHF exacerbation. Pt PMH includes but is not limited to: heart failure, arthritis, asthma, GERD, HTN, DM II, PAF, HLD, OSA, CKD, and memory impairment.  Precautions  Precautions Fall  Restrictions  Weight Bearing Restrictions Per Provider Order No  Pain Assessment  Pain Assessment No/denies pain  Cognition  Arousal Alert  Behavior During Therapy WFL for tasks assessed/performed  PT - Cognitive impairments History of cognitive impairments;No family/caregiver present to determine baseline  Following Commands  Following commands Impaired  Following commands impaired Follows one step commands inconsistently;Follows one step commands with increased time  Cueing  Cueing Techniques Verbal cues;Visual cues  Communication  Communication Impaired  Factors Affecting Communication Difficulty expressing self  Bed Mobility  Overal bed mobility Needs Assistance  Bed Mobility Rolling;Supine to Sit;Sit to Supine  Rolling Max assist;Used rails  Sit to supine Max assist;+2 for physical assistance;HOB elevated  General bed mobility comments Increased processing time for simple tasks  Transfers  Overall transfer level Needs assistance  Equipment used Ambulation equipment used  Transfers Sit to/from Stand  Sit to Stand Via lift  equipment  Transfer via Lift Equipment [place]  General transfer comment MaxAx2 to raise to standing in [place]  Ambulation/Gait  General Gait Details NT  Balance  Overall balance assessment Needs assistance  Sitting-balance support Bilateral upper extremity supported;Feet supported  Sitting balance-Leahy Scale Fair  Standing balance support Bilateral upper extremity supported;During functional activity;Reliant on assistive device for balance  Standing balance-Leahy Scale Zero  PT - End of Session  Equipment Utilized During Treatment Oxygen   Activity Tolerance Patient limited by fatigue  Patient left in bed;with call bell/phone within reach  Nurse Communication Mobility status   PT - Assessment/Plan  PT Visit Diagnosis Unsteadiness on feet (R26.81);Muscle weakness (generalized) (M62.81);Difficulty in walking, not elsewhere classified (R26.2)  PT Frequency (ACUTE ONLY) Min 2X/week  Follow Up Recommendations Skilled nursing-short term rehab (<3 hours/day)  Can patient physically be transported by private vehicle No  Patient can return home with the following Two people to help with walking and/or transfers;Two people to help with bathing/dressing/bathroom;Assistance with cooking/housework;Direct supervision/assist for medications management;Direct supervision/assist for financial management;Assist for transportation;Help with stairs or ramp for entrance;Supervision due to cognitive status  PT equipment None recommended by PT  AM-PAC PT "6 Clicks" Mobility Outcome Measure (Version 2)  Help needed turning from your back to your side while in a flat bed without using bedrails? 2  Help needed moving from lying on your back to sitting on the side of a flat bed without using bedrails? 2  Help needed moving to and from a bed to a chair (including a wheelchair)? 1  Help needed standing up from a chair using your arms (e.g., wheelchair or bedside chair)? 1  Help needed to walk in hospital room? 1  Help  needed climbing 3-5 steps with a railing?  1  6 Click Score 8  Consider Recommendation of Discharge To: CIR/SNF/LTACH  Progressive Mobility  What is the highest level of mobility based on the progressive mobility assessment? Level 2 (Chairfast) - Balance while sitting on edge of bed and cannot stand  Activity Transferred from chair to bed  PT Time Calculation  PT Start Time (ACUTE ONLY) 1342  PT Stop Time (ACUTE ONLY) 1402  PT Time Calculation (min) (ACUTE ONLY) 20 min  PT General Charges  $$ ACUTE PT VISIT 1 Visit  PT Treatments  $Therapeutic Activity 8-22 mins   Melvyn Stagers, PTA 09/17/2023

## 2023-09-17 NOTE — Hospital Course (Addendum)
 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    Upon evaluation in the emergency department patient was clinically found to be in respiratory distress with acute hypercapnic respiratory failure with initial pCO2 of 120 and pH of 7.2.  Patient was felt to be from acute congestive heart failure with superimposed reactive airway disease and bilateral pleural effusions.  The hospitalist group was then called to assess the patient for admission to the hospital.  Patient was initially managed with BiPAP therapy, intravenous Solu-Medrol , intravenous Lasix and empiric antibiotic therapy.  Preliminary blood cultures revealed 1 set positive for Staph epidermidis and Staph hominis.  However, after 2 days it was felt that these were likely contaminant and intravenous vancomycin  was discontinued.  Antibiotics have since been de-escalated to cefuroxime for a total course of 5 to 7 days.  As patient has clinically improved patient was just transitioned to oral Lasix.  Patient was transitioned off of BiPAP to nasal cannula.  Assessment of patient's oxygen  requirement revealed the patient required 1 L of oxygen  via nasal cannula at all times.  Additionally, patient is recommended to use CPAP nightly with sleep and even during the day when she naps.  As patient has clinically improved arranges have been made for the patient to be discharged back to her skilled facility [facility] in improved and stable condition on 09/18/2023.  It is highly advisable that the patient follow closely with her facility provider in addition to following up as an outpatient with her pulmonologist Dr. Gaynell Keeler for repeat sleep study to ensure that her CPAP settings are adequate.

## 2023-09-17 NOTE — Assessment & Plan Note (Signed)
.   Patient been placed on Accu-Cheks before every meal and nightly with sliding scale insulin . Holding home regimen of hypoglycemics . Hemoglobin A1C ordered . Diabetic Diet  

## 2023-09-17 NOTE — Assessment & Plan Note (Signed)
 Thought to be secondary to acute on chronic diastolic congestive heart failure with bilateral pleural effusions superimposed on possible reactive airway disease Clinically improved, now off of BiPAP, weaning nasal cannula Continuing daily prednisone Continuing bronchodilator therapy Patient has now been transitioned to oral furosemide.

## 2023-09-17 NOTE — Assessment & Plan Note (Signed)
 Continuing bronchodilator therapy Transition to burst of oral prednisone

## 2023-09-17 NOTE — Assessment & Plan Note (Signed)
.   Resume patients home regimen of oral antihypertensives . Titrate antihypertensive regimen as necessary to achieve adequate BP control . PRN intravenous antihypertensives for excessively elevated blood pressure   

## 2023-09-17 NOTE — Assessment & Plan Note (Signed)
 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

## 2023-09-17 NOTE — Progress Notes (Signed)
   09/17/23 2231  BiPAP/CPAP/SIPAP  BiPAP/CPAP/SIPAP Pt Type Adult  BiPAP/CPAP/SIPAP DREAMSTATIOND  Mask Type Full face mask  Dentures removed? Not applicable  Mask Size Large  Respiratory Rate 20 breaths/min  IPAP 10 cmH20  EPAP 5 cmH2O  Flow Rate 2 lpm (bled into circuit)  Patient Home Machine No  Patient Home Mask No  Patient Home Tubing No  Auto Titrate No  CPAP/SIPAP surface wiped down Yes  Device Plugged into RED Power Outlet Yes  BiPAP/CPAP /SiPAP Vitals  Resp 20  MEWS Score/Color  MEWS Score 2  MEWS Score Color Yellow

## 2023-09-17 NOTE — Progress Notes (Signed)
 PROGRESS NOTE   Elizabeth [name]  XBJ:[id number] DOB: 1940-08-28 DOA: 09/14/2023 PCP: Shari Daughters, MD   Date of Service: the patient was seen and examined on 09/17/2023  Brief Narrative:  83 year old female history of paroxysmal A-fib on chronic anticoagulation, chronic HFpEF (EF 55-60% with G1DD), hypertension, hyperlipidemia, type 2 diabetes, OSA noncompliant with CPAP, GERD, memory impairment presenting to the [facility] emergency department with fusion and lethargy.    Upon evaluation in the emergency department patient was clinically found to be in respiratory distress with acute hypercapnic respiratory failure with initial pCO2 of 120 and pH of 7.2.  Patient was felt to be from acute congestive heart failure with superimposed reactive airway disease and bilateral pleural effusions.  The hospitalist group was then called to assess the patient for admission to the hospital.  Patient was initially managed with BiPAP therapy, intravenous Solu-Medrol , intravenous Lasix and empiric antibiotic therapy.  Preliminary blood cultures revealed 1 set positive for Staph epidermidis and Staph hominis.  However, after 2 days it was felt that these were likely contaminant and intravenous vancomycin  was discontinued.  Antibiotics have since been de-escalated to cefuroxime for a total course of 5 to 7 days.  As patient has clinically improved patient has been transitioned to oral Lasix.     Assessment & Plan Acute respiratory failure with hypercapnia (HCC) Thought to be secondary to acute on chronic diastolic congestive heart failure with bilateral pleural effusions superimposed on possible reactive airway disease Clinically improved, now off of BiPAP, weaning nasal cannula Continuing daily prednisone Continuing bronchodilator therapy Patient has now been transitioned to oral furosemide. Acute on chronic diastolic CHF (congestive heart failure) (HCC) See notations above. Acute  metabolic encephalopathy Patient initially presented with severe lethargy and confusion due to hypercapnic encephalopathy from acute hypercapnic respiratory failure  mentation now approaching baseline with resolution of hypercapnia Reactive airway disease with acute exacerbation Continuing bronchodilator therapy Transition to burst of oral prednisone Type 2 diabetes mellitus without complication, without long-term current use of insulin  (HCC) Patient been placed on Accu-Cheks before every meal and nightly with sliding scale insulin  Holding home regimen of hypoglycemics Hemoglobin A1C ordered Diabetic Diet  Essential hypertension Resume patients home regimen of oral antihypertensives Titrate antihypertensive regimen as necessary to achieve adequate BP control PRN intravenous antihypertensives for excessively elevated blood pressure  GERD (gastroesophageal reflux disease) Continuing home regimen of daily PPI therapy.  OSA (obstructive sleep apnea) BiPAP nightly currently Will need improved CPAP nightly compliance at time of discharge Hypomagnesemia Replaced Hypophosphatemia Were laced    Subjective:  Patient reports that her shortness of breath has improved.  Physical Exam:  Vitals:   09/17/23 1339 09/17/23 1951 09/17/23 2021 09/17/23 2231  BP:  (!) 156/57    Pulse:  (!) 55    Resp:  18  20  Temp:  98.3 F (36.8 C)    TempSrc:  Oral    SpO2: 99% 100% 99%   Weight:        Constitutional: Awake alert and oriented x3, no associated distress.   Skin: no rashes, no lesions, good skin turgor noted. Eyes: Pupils are equally reactive to light.  No evidence of scleral icterus or conjunctival pallor.  ENMT: Moist mucous membranes noted.  Posterior pharynx clear of any exudate or lesions.   Respiratory: Mild bibasilar rales with mild intermittent expiratory wheezing.  No evidence of accessory muscle use.   Cardiovascular: Regular rate and rhythm, no murmurs / rubs / gallops. No  extremity edema.  2+ pedal pulses. No carotid bruits.  Abdomen: Abdomen is soft and nontender.  No evidence of intra-abdominal masses.  Positive bowel sounds noted in all quadrants.   Musculoskeletal: No joint deformity upper and lower extremities. Good ROM, no contractures. Normal muscle tone.    Data Reviewed:  I have personally reviewed and interpreted labs, imaging.  Significant findings are   CBC: Recent Labs  Lab 09/14/23 1300 09/15/23 0331 09/16/23 0303 09/17/23 0426  WBC 3.2* 3.2* 6.3 9.6  NEUTROABS 2.1  --  5.4  --   HGB 9.9* 10.7* 9.7* 9.8*  HCT 36.2 38.2 32.5* 32.3*  MCV 105.5* 103.5* 99.4 95.8  PLT 171 193 197 200   Basic Metabolic Panel: Recent Labs  Lab 09/14/23 1300 09/15/23 0331 09/16/23 0303 09/17/23 0426  NA 141 141 131* 131*  K 4.9 4.3 3.7 3.8  CL 91* 89* 82* 81*  CO2 37* 36* 38* 42*  GLUCOSE 84 102* 149* 113*  BUN 12 17 25* 35*  CREATININE 0.48 0.58 0.77 0.88  CALCIUM  9.3 9.7 8.9 9.2  MG 1.7 1.5* 2.0  --   PHOS  --  2.2* 3.2 4.1   GFR: Estimated Creatinine Clearance: 60.3 mL/min (by C-G formula based on SCr of 0.88 mg/dL). Liver Function Tests: Recent Labs  Lab 09/14/23 1300 09/16/23 0303 09/17/23 0426  AST 13*  --   --   ALT 8  --   --   ALKPHOS 51  --   --   BILITOT 0.5  --   --   PROT 6.5  --   --   ALBUMIN 2.8* 2.8* 2.9*    Code Status:  DNR.  Code status decision has been confirmed with: patient    Severity of Illness:  The appropriate patient status for this patient is INPATIENT. Inpatient status is judged to be reasonable and necessary in order to provide the required intensity of service to ensure the patient's safety. The patient's presenting symptoms, physical exam findings, and initial radiographic and laboratory data in the context of their chronic comorbidities is felt to place them at high risk for further clinical deterioration. Furthermore, it is not anticipated that the patient will be medically stable for discharge  from the hospital within 2 midnights of admission.   * I certify that at the point of admission it is my clinical judgment that the patient will require inpatient hospital care spanning beyond 2 midnights from the point of admission due to high intensity of service, high risk for further deterioration and high frequency of surveillance required.*  Time spent:  45 minutes  Author:  True Fuss MD  09/17/2023 10:45 PM

## 2023-09-17 NOTE — Progress Notes (Signed)
   09/17/23 0242  BiPAP/CPAP/SIPAP  $ Non-Invasive Home Ventilator  Initial  $ Face Mask Large  Yes  BiPAP/CPAP/SIPAP Pt Type Adult  BiPAP/CPAP/SIPAP DREAMSTATIOND  Mask Type Full face mask  Dentures removed? Not applicable  Mask Size Large  IPAP 10 cmH20  EPAP 5 cmH2O  FiO2 (%) 28 %  Patient Home Machine No  Patient Home Mask No  Patient Home Tubing No  CPAP/SIPAP surface wiped down Yes  Device Plugged into RED Power Outlet Yes  BiPAP/CPAP /SiPAP Vitals  Resp 17  MEWS Score/Color  MEWS Score 0  MEWS Score Color Green

## 2023-09-17 NOTE — Assessment & Plan Note (Signed)
 Replaced

## 2023-09-17 NOTE — TOC Progression Note (Signed)
 Transition of Care [facility]) - Progression Note    Patient Details  Name: CHARA MARQUARD MRN: [id number] Date of Birth: May 24, 1940  [facility] CM/SW Contact  Gertha Ku, LCSW Phone Number: 09/17/2023, 11:38 AM  Clinical Narrative:     Facility has started [facility] [employer] [facility] term rehab. TOC to follow.   Expected Discharge Plan: Skilled Nursing Facility Barriers to Discharge: Continued Medical Work up  Expected Discharge Plan and Services       Living arrangements for the past 2 months: Skilled Nursing Facility [facility])                                       Social Determinants of Health (SDOH) Interventions SDOH Screenings   Food Insecurity: Patient Unable To Answer (09/16/2023)  Housing: Patient Unable To Answer (09/16/2023)  Transportation Needs: Patient Unable To Answer (09/16/2023)  Utilities: Patient Unable To Answer (09/16/2023)  Social Connections: Patient Unable To Answer (09/16/2023)  Tobacco Use: Medium Risk (09/14/2023)    Readmission Risk Interventions    04/28/2023   11:56 AM  Readmission Risk Prevention Plan  Transportation Screening Complete  PCP or Specialist Appt within 5-7 Days Complete  Home Care Screening Complete  Medication Review (RN CM) Complete

## 2023-09-17 NOTE — Assessment & Plan Note (Signed)
Continuing home regimen of daily PPI therapy.  

## 2023-09-17 NOTE — Assessment & Plan Note (Signed)
 See notations above.

## 2023-09-17 NOTE — Assessment & Plan Note (Signed)
 Were laced

## 2023-09-17 NOTE — Assessment & Plan Note (Signed)
 BiPAP nightly currently Will need improved CPAP nightly compliance at time of discharge

## 2023-09-18 ENCOUNTER — Inpatient Hospital Stay (HOSPITAL_COMMUNITY)

## 2023-09-18 DIAGNOSIS — J9602 Acute respiratory failure with hypercapnia: Secondary | ICD-10-CM | POA: Diagnosis not present

## 2023-09-18 DIAGNOSIS — G9341 Metabolic encephalopathy: Secondary | ICD-10-CM | POA: Diagnosis not present

## 2023-09-18 DIAGNOSIS — J45901 Unspecified asthma with (acute) exacerbation: Secondary | ICD-10-CM | POA: Diagnosis not present

## 2023-09-18 DIAGNOSIS — I5033 Acute on chronic diastolic (congestive) heart failure: Secondary | ICD-10-CM | POA: Diagnosis not present

## 2023-09-18 LAB — PROCALCITONIN: Procalcitonin: <0.1 ng/mL

## 2023-09-18 LAB — CBC WITH DIFFERENTIAL/PLATELET
Abs Immature Granulocytes: 0.05 10*3/uL (ref 0.00–0.07)
Basophils Absolute: 0 10*3/uL (ref 0.0–0.1)
Basophils Relative: 0 %
Eosinophils Absolute: 0 10*3/uL (ref 0.0–0.5)
Eosinophils Relative: 0 %
HCT: 32.2 % — ABNORMAL LOW (ref 36.0–46.0)
Hemoglobin: 9.8 g/dL — ABNORMAL LOW (ref 12.0–15.0)
Immature Granulocytes: 1 %
Lymphocytes Relative: 15 %
Lymphs Abs: 1.2 10*3/uL (ref 0.7–4.0)
MCH: 29.5 pg (ref 26.0–34.0)
MCHC: 30.4 g/dL (ref 30.0–36.0)
MCV: 97 fL (ref 80.0–100.0)
Monocytes Absolute: 0.8 10*3/uL (ref 0.1–1.0)
Monocytes Relative: 11 %
Neutro Abs: 5.9 10*3/uL (ref 1.7–7.7)
Neutrophils Relative %: 73 %
Platelets: 195 10*3/uL (ref 150–400)
RBC: 3.32 MIL/uL — ABNORMAL LOW (ref 3.87–5.11)
RDW: 15.2 % (ref 11.5–15.5)
WBC: 8 10*3/uL (ref 4.0–10.5)
nRBC: 0 % (ref 0.0–0.2)

## 2023-09-18 LAB — COMPREHENSIVE METABOLIC PANEL WITH GFR
ALT: 12 U/L (ref 0–44)
AST: 11 U/L — ABNORMAL LOW (ref 15–41)
Albumin: 2.7 g/dL — ABNORMAL LOW (ref 3.5–5.0)
Alkaline Phosphatase: 41 U/L (ref 38–126)
Anion gap: 9 (ref 5–15)
BUN: 41 mg/dL — ABNORMAL HIGH (ref 8–23)
CO2: 40 mmol/L — ABNORMAL HIGH (ref 22–32)
Calcium: 8.6 mg/dL — ABNORMAL LOW (ref 8.9–10.3)
Chloride: 79 mmol/L — ABNORMAL LOW (ref 98–111)
Creatinine, Ser: 0.9 mg/dL (ref 0.44–1.00)
GFR, Estimated: >60 mL/min
Glucose, Bld: 120 mg/dL — ABNORMAL HIGH (ref 70–99)
Potassium: 3.8 mmol/L (ref 3.5–5.1)
Sodium: 128 mmol/L — ABNORMAL LOW (ref 135–145)
Total Bilirubin: 0.5 mg/dL (ref 0.0–1.2)
Total Protein: 5.7 g/dL — ABNORMAL LOW (ref 6.5–8.1)

## 2023-09-18 LAB — BLOOD GAS, VENOUS
Acid-Base Excess: 20.9 mmol/L — ABNORMAL HIGH (ref 0.0–2.0)
Bicarbonate: 49.3 mmol/L — ABNORMAL HIGH (ref 20.0–28.0)
O2 Saturation: 78.1 %
Patient temperature: 37.2
pCO2, Ven: 72 mmHg (ref 44–60)
pH, Ven: 7.45 — ABNORMAL HIGH (ref 7.25–7.43)
pO2, Ven: 46 mmHg — ABNORMAL HIGH (ref 32–45)

## 2023-09-18 LAB — BRAIN NATRIURETIC PEPTIDE: B Natriuretic Peptide: 221.2 pg/mL — ABNORMAL HIGH (ref 0.0–100.0)

## 2023-09-18 LAB — GLUCOSE, CAPILLARY
Glucose-Capillary: 112 mg/dL — ABNORMAL HIGH (ref 70–99)
Glucose-Capillary: 161 mg/dL — ABNORMAL HIGH (ref 70–99)

## 2023-09-18 LAB — MAGNESIUM: Magnesium: 2.1 mg/dL (ref 1.7–2.4)

## 2023-09-18 MED ORDER — CARVEDILOL 3.125 MG PO TABS: 3.1250 mg | ORAL_TABLET | Freq: Two times a day (BID) | ORAL | Status: DC

## 2023-09-18 MED ORDER — CEFUROXIME AXETIL 500 MG PO TABS: 500.0000 mg | ORAL_TABLET | Freq: Two times a day (BID) | ORAL | Status: AC

## 2023-09-18 MED ORDER — INSULIN ASPART 100 UNIT/ML IJ SOLN: 0.0000 [IU] | Freq: Three times a day (TID) | INTRAMUSCULAR | Status: DC

## 2023-09-18 MED ORDER — FUROSEMIDE 40 MG PO TABS: 40.0000 mg | ORAL_TABLET | Freq: Every day | ORAL | Status: DC | PRN

## 2023-09-18 MED ORDER — PREDNISONE 20 MG PO TABS
ORAL_TABLET | ORAL | Status: AC
Start: 2023-09-19 — End: 2023-09-27

## 2023-09-18 NOTE — Assessment & Plan Note (Deleted)
.   Resume patients home regimen of oral antihypertensives . Titrate antihypertensive regimen as necessary to achieve adequate BP control . PRN intravenous antihypertensives for excessively elevated blood pressure   

## 2023-09-18 NOTE — Assessment & Plan Note (Deleted)
 See notations above.

## 2023-09-18 NOTE — Telephone Encounter (Signed)
 Lm for pt.  Will place order after speaking with pt.

## 2023-09-18 NOTE — Telephone Encounter (Signed)
 Pt's daughter, [name](DPR) is aware of below message/recommendations and voiced her understanding. Order has been placed.  PCC's, can we confirm with the sleep lab that they can accommodate. Pt is not mobile. Thanks

## 2023-09-18 NOTE — Assessment & Plan Note (Deleted)
 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

## 2023-09-18 NOTE — Assessment & Plan Note (Deleted)
 BiPAP nightly currently Will need improved CPAP nightly compliance at time of discharge

## 2023-09-18 NOTE — Assessment & Plan Note (Deleted)
.   Patient been placed on Accu-Cheks before every meal and nightly with sliding scale insulin . Holding home regimen of hypoglycemics . Hemoglobin A1C ordered . Diabetic Diet  

## 2023-09-18 NOTE — Assessment & Plan Note (Deleted)
 Replaced

## 2023-09-18 NOTE — Assessment & Plan Note (Deleted)
Continuing home regimen of daily PPI therapy.  

## 2023-09-18 NOTE — Telephone Encounter (Signed)
 Patient daughter is returning call she received from office . Calling cal line no answer from cal . Please give patient daughter a call back concerning the call she received [phone]

## 2023-09-18 NOTE — Discharge Instructions (Addendum)
 Patient is DNR  Patient may get out of bed to with assistance using an assistive device Please perform Accu-Cheks on this patient before every meal and nightly, apply sliding scale per medication reconciliation. Patient to be ministered all prescribed medications exactly as instructed. Patient should be undergo daily weights.  If her weight increases by more than 2 pounds in one day or 5 pounds in one week she should get a dose of Lasix per the medication reconciliation list.  Patient receives a low-sodium low carbohydrate diet Follow-up with facility provider per protocol.  Patient should additionally follow up with her pulmonologist and sleep medicine provider Dr. Gaynell Keeler Patient is to use 1 liter of oxygen  via nasal canula at all times to achieve target oxygen  saturations of 89-92% Patient is to remain on CPAP therapy every evening with sleep or during the day when she naps.  Please bring patient back to the emergency department if she worsening shortness of breath, weakness, fevers in excess of 100.89F or inability to tolerate oral intake.

## 2023-09-18 NOTE — Plan of Care (Signed)
  Problem: Education: Goal: Knowledge of General Education information will improve Description: Including pain rating scale, medication(s)/side effects and non-pharmacologic comfort measures Outcome: Progressing   Problem: Health Behavior/Discharge Planning: Goal: Ability to manage health-related needs will improve Outcome: Progressing   Problem: Clinical Measurements: Goal: Ability to maintain clinical measurements within normal limits will improve Outcome: Progressing Goal: Will remain free from infection Outcome: Progressing Goal: Diagnostic test results will improve Outcome: Progressing Goal: Respiratory complications will improve Outcome: Progressing Goal: Cardiovascular complication will be avoided Outcome: Progressing   Problem: Activity: Goal: Risk for activity intolerance will decrease Outcome: Progressing   Problem: Nutrition: Goal: Adequate nutrition will be maintained Outcome: Progressing   Problem: Coping: Goal: Level of anxiety will decrease Outcome: Progressing   Problem: Elimination: Goal: Will not experience complications related to bowel motility Outcome: Progressing Goal: Will not experience complications related to urinary retention Outcome: Progressing   Problem: Pain Managment: Goal: General experience of comfort will improve and/or be controlled Outcome: Progressing   Problem: Safety: Goal: Ability to remain free from injury will improve Outcome: Progressing   Problem: Skin Integrity: Goal: Risk for impaired skin integrity will decrease Outcome: Progressing   Problem: Education: Goal: Ability to demonstrate management of disease process will improve Outcome: Progressing Goal: Ability to verbalize understanding of medication therapies will improve Outcome: Progressing Goal: Individualized Educational Video(s) Outcome: Progressing   Problem: Activity: Goal: Capacity to carry out activities will improve Outcome: Progressing    Problem: Cardiac: Goal: Ability to achieve and maintain adequate cardiopulmonary perfusion will improve Outcome: Progressing   Problem: Education: Goal: Knowledge of disease or condition will improve Outcome: Progressing Goal: Knowledge of the prescribed therapeutic regimen will improve Outcome: Progressing Goal: Individualized Educational Video(s) Outcome: Progressing   Problem: Activity: Goal: Ability to tolerate increased activity will improve Outcome: Progressing Goal: Will verbalize the importance of balancing activity with adequate rest periods Outcome: Progressing   Problem: Respiratory: Goal: Ability to maintain a clear airway will improve Outcome: Progressing Goal: Levels of oxygenation will improve Outcome: Progressing Goal: Ability to maintain adequate ventilation will improve Outcome: Progressing   Problem: Education: Goal: Ability to describe self-care measures that may prevent or decrease complications (Diabetes Survival Skills Education) will improve Outcome: Progressing Goal: Individualized Educational Video(s) Outcome: Progressing   Problem: Coping: Goal: Ability to adjust to condition or change in health will improve Outcome: Progressing   Problem: Fluid Volume: Goal: Ability to maintain a balanced intake and output will improve Outcome: Progressing   Problem: Health Behavior/Discharge Planning: Goal: Ability to identify and utilize available resources and services will improve Outcome: Progressing Goal: Ability to manage health-related needs will improve Outcome: Progressing   Problem: Metabolic: Goal: Ability to maintain appropriate glucose levels will improve Outcome: Progressing   Problem: Nutritional: Goal: Maintenance of adequate nutrition will improve Outcome: Progressing Goal: Progress toward achieving an optimal weight will improve Outcome: Progressing   Problem: Skin Integrity: Goal: Risk for impaired skin integrity will  decrease Outcome: Progressing   Problem: Tissue Perfusion: Goal: Adequacy of tissue perfusion will improve Outcome: Progressing

## 2023-09-18 NOTE — Discharge Summary (Addendum)
 Physician Discharge Summary   Patient: Elizabeth [name] MRN: [id number] DOB: [birth year]  Admit date:     09/14/2023  Discharge date:   Discharge Physician: Elizabeth [name]   PCP: Elizabeth Daughters, MD   Recommendations at discharge:   Patient is DNR  Patient may get out of bed to with assistance using an assistive device Please perform Accu-Cheks on this patient before every meal and nightly, apply sliding scale per medication reconciliation. Patient to be ministered all prescribed medications exactly as instructed. Patient should be undergo daily weights.  If her weight increases by more than 2 pounds in one day or 5 pounds in one week she should get a dose of Lasix per the medication reconciliation list.  Patient receives a low-sodium low carbohydrate diet Follow-up with facility provider per protocol.  Patient should additionally follow up with her pulmonologist and sleep medicine provider Elizabeth [name] Patient is to use 1 liter of oxygen  via nasal canula at all times to achieve target oxygen  saturations of 89-92% Patient is to remain on CPAP therapy every evening with sleep or during the day when she naps.  Please bring patient back to the emergency department if she worsening shortness of breath, weakness, fevers in excess of 100.36F or inability to tolerate oral intake.    Discharge Diagnoses: Principal Problem:   Acute metabolic encephalopathy Active Problems:   Reactive airway disease with acute exacerbation   Acute on chronic diastolic CHF (congestive heart failure) (HCC)   Chronic venous insufficiency   Type 2 diabetes mellitus without complication, without long-term current use of insulin  (HCC)   Essential hypertension   GERD (gastroesophageal reflux disease)   Diabetes mellitus without complication (HCC)   OSA (obstructive sleep apnea)   (HFpEF) heart failure with preserved ejection fraction (HCC)   Morbid obesity (HCC)   Hypomagnesemia   Hypophosphatemia   Acute  respiratory failure with hypercapnia (HCC)  Resolved Problems:   * No resolved hospital problems. *   Hospital Course: 83 year old female history of paroxysmal A-fib on chronic anticoagulation, chronic HFpEF (EF 55-60% with G1DD), hypertension, hyperlipidemia, type 2 diabetes, OSA noncompliant with CPAP, GERD, memory impairment presenting to [name] emergency department with fusion and lethargy.    Upon evaluation in the emergency department patient was clinically found to be in respiratory distress with acute hypercapnic respiratory failure with initial pCO2 of 120 and pH of 7.2.  Patient was felt to be from acute congestive heart failure with superimposed reactive airway disease and bilateral pleural effusions.  The hospitalist group was then called to assess the patient for admission to the hospital.  Patient was initially managed with BiPAP therapy, intravenous Solu-Medrol , intravenous Lasix and empiric antibiotic therapy.  Preliminary blood cultures revealed 1 set positive for Staph epidermidis and Staph hominis.  However, after 2 days it was felt that these were likely contaminant and intravenous vancomycin  was discontinued.  Antibiotics have since been de-escalated to cefuroxime for a total course of 5 to 7 days.  As patient has clinically improved patient was just transitioned to oral Lasix.  Patient was transitioned off of BiPAP to nasal cannula.  Assessment of patient's oxygen  requirement revealed the patient required 1 L of oxygen  via nasal cannula at all times.  Additionally, patient is recommended to use CPAP nightly with sleep and even during the day when she naps.  As patient has clinically improved arranges have been made for the patient to be discharged back to her skilled facility [facility] in  improved and stable condition on 09/18/2023.  It is highly advisable that the patient follow closely with her facility provider in addition to following up as an  outpatient with her pulmonologist Elizabeth [name] for repeat sleep study to ensure that her CPAP settings are adequate.      Pain control - Elizabeth [name]  Controlled Substance Reporting System database was reviewed. and patient was instructed, not to drive, operate heavy machinery, perform activities at heights, swimming or participation in water activities or provide baby-sitting services while on Pain, Sleep and Anxiety Medications; until their outpatient Physician has advised to do so again. Also recommended to not to take more than prescribed Pain, Sleep and Anxiety Medications.   Consultants: none Procedures performed: none  Disposition: Long term care facility Diet recommendation:  Discharge Diet Orders (From admission, onward)     Start     Ordered   09/18/23 0000  Diet - low sodium heart healthy        09/18/23 1428           Cardiac and Carb modified diet  DISCHARGE MEDICATION: Allergies as of 09/18/2023   No Known Allergies      Medication List     TAKE these medications    acetaminophen  500 MG tablet Commonly known as: TYLENOL  Take 1,000 mg by mouth in the morning and at bedtime.   albuterol  108 (90 Base) MCG/ACT inhaler Commonly known as: VENTOLIN  HFA Inhale 2 puffs into the lungs every 6 (six) hours as needed for shortness of breath.   albuterol  (2.5 MG/3ML) 0.083% nebulizer solution Commonly known as: PROVENTIL  Take 2.5 mg by nebulization every 6 (six) hours as needed for wheezing or shortness of breath.   apixaban  5 MG Tabs tablet Commonly known as: ELIQUIS  Take 1 tablet (5 mg total) by mouth 2 (two) times daily.   BIOFREEZE COOL THE PAIN EX Apply 1 application  topically in the morning and at bedtime. Apply to BL hands and ankles   bisacodyl  5 MG EC tablet Commonly known as: DULCOLAX Take 2 tablets (10 mg total) by mouth daily as needed for moderate constipation or severe constipation.   budesonide-formoterol  160-4.5 MCG/ACT inhaler Commonly  known as: SYMBICORT Inhale 2 puffs into the lungs 2 (two) times daily.   carvedilol 3.125 MG tablet Commonly known as: Coreg Take 1 tablet (3.125 mg total) by mouth 2 (two) times daily.   cefUROXime 500 MG tablet Commonly known as: CEFTIN Take 1 tablet (500 mg total) by mouth 2 (two) times daily with a meal for 4 doses.   CENTRUM SILVER PO Take 1 tablet by mouth daily.   cetirizine 10 MG tablet Commonly known as: ZYRTEC Take 10 mg by mouth daily.   cyanocobalamin  1000 MCG tablet Commonly known as: VITAMIN B12 Take 1,000 mcg by mouth daily.   diclofenac Sodium 1 % Gel Commonly known as: VOLTAREN Apply 1 Application topically 4 (four) times daily.   donepezil  5 MG tablet Commonly known as: ARICEPT  Take 1 tablet (5 mg total) by mouth daily.   fluticasone  50 MCG/ACT nasal spray Commonly known as: FLONASE  PLACE 1 SPRAY INTO BOTH NOSTRILS 2 (TWO) TIMES DAILY   furosemide 40 MG tablet Commonly known as: Lasix Take 1 tablet (40 mg total) by mouth daily as needed. Weigh yourself daily.  Take one dose by mouth if your weight increases by more than 2 pounds in one day or 5 pounds in one week.   gabapentin 100 MG capsule Commonly known as: NEURONTIN Take 100  mg by mouth daily. Take 200 mg by mouth three times daily. Take 300 mg by mouth at bedtime.   hydrALAZINE  50 MG tablet Commonly known as: APRESOLINE  Take 25 mg by mouth every 8 (eight) hours as needed (hypertension).   insulin  aspart 100 UNIT/ML injection Commonly known as: novoLOG  Inject 0-15 Units into the skin 4 (four) times daily -  before meals and at bedtime.   Jardiance 10 MG Tabs tablet Generic drug: empagliflozin Take 10 mg by mouth daily.   lidocaine  5 % Commonly known as: LIDODERM  Place 1 patch onto the skin at bedtime.   losartan  100 MG tablet Commonly known as: COZAAR  Take 100 mg by mouth daily.   oxybutynin  10 MG 24 hr tablet Commonly known as: DITROPAN -XL Take 10 mg by mouth daily.    pantoprazole  40 MG tablet Commonly known as: PROTONIX  Take 40 mg by mouth daily.   potassium chloride 10 MEQ tablet Commonly known as: KLOR-CON Take 10 mEq by mouth daily.   predniSONE 20 MG tablet Commonly known as: DELTASONE Take 3 tablets (60 mg total) by mouth daily before breakfast for 2 days, THEN 2 tablets (40 mg total) daily before breakfast for 3 days, THEN 1 tablet (20 mg total) daily before breakfast for 3 days. Start taking on: Sep 19, 2023   rosuvastatin  5 MG tablet Commonly known as: CRESTOR  Take 5 mg by mouth daily.   senna 8.6 MG tablet Commonly known as: SENOKOT Take 2 tablets by mouth daily.   sertraline 25 MG tablet Commonly known as: ZOLOFT Take 25 mg by mouth daily.        Follow-up Information     Elizabeth Daughters, MD. Schedule an appointment as soon as possible for a visit.   Specialty: Internal Medicine Contact information: [address] [place] [address] [phone]                 Discharge Exam: Elizabeth [name] Weights   09/15/23 0500 09/16/23 0315 09/18/23 0900  Weight: 114.6 kg 115.3 kg 118 kg    Constitutional: Awake alert and oriented x3, no associated distress.  Patient is obese. Respiratory: Slightly diminished breath sounds in the bases.  Minimal bibasilar rales.  No evidence of wheezing. Normal respiratory effort. No accessory muscle use.  Cardiovascular: Regular rate and rhythm, no murmurs / rubs / gallops. No extremity edema. 2+ pedal pulses. No carotid bruits.  Abdomen: Abdomen is protuberant but soft and nontender.  No evidence of intra-abdominal masses.  Positive bowel sounds noted in all quadrants.   Musculoskeletal: No joint deformity upper and lower extremities. Good ROM, no contractures. Normal muscle tone.     Condition at discharge: fair  The results of significant diagnostics from this hospitalization (including imaging, microbiology, ancillary and laboratory) are listed below for reference.   Imaging  Studies: DG Chest 1 View Result Date: 09/18/2023 CLINICAL DATA:  Follow-up pleural effusion EXAM: PORTABLE CHEST 1 VIEW COMPARISON:  09/14/2023 FINDINGS: Bilateral pleural effusions are again seen left greater than right and similar to that noted on prior CT examination. No focal confluent infiltrate is seen. No pneumothorax is noted. Cardiac shadow is stable. IMPRESSION: Stable bilateral pleural effusions. Electronically Signed   By: Violeta Grey M.D.   On: 09/18/2023 09:55   ECHOCARDIOGRAM COMPLETE Result Date: 09/15/2023    ECHOCARDIOGRAM REPORT   Patient Name:   Elizabeth [name] Date of Exam: 09/15/2023 Medical Rec #:  [id number]       Height:  64.0 in Accession #:    [id number]      Weight:       252.6 lb Date of Birth:  1940/09/22        BSA:          2.161 m Patient Age:    83 years        BP:           198/64 mmHg Patient Gender: F               HR:           79 bpm. Exam Location:  Inpatient Procedure: 2D Echo, Color Doppler and Cardiac Doppler (Both Spectral and Color            Flow Doppler were utilized during procedure). Indications:    CHF Acute Diastolic I50.31  History:        Patient has prior history of Echocardiogram examinations, most                 recent 01/10/2023. CHF, Arrythmias:Atrial Fibrillation; Risk                 Factors:Diabetes and Hypertension.  Sonographer:    Hersey Lorenzo RDCS Referring Phys: [id number] DANIEL V THOMPSON IMPRESSIONS  1. Left ventricular ejection fraction, by estimation, is 55 to 60%. The left ventricle has normal function. The left ventricle has no regional wall motion abnormalities. There is moderate concentric left ventricular hypertrophy. Left ventricular diastolic parameters are consistent with Grade I diastolic dysfunction (impaired relaxation).  2. Right ventricular systolic function is mildly reduced. The right ventricular size is mildly enlarged. Tricuspid regurgitation signal is inadequate for assessing PA pressure.  3. Left atrial size was mildly  dilated.  4. The mitral valve is degenerative. Trivial mitral valve regurgitation. No evidence of mitral stenosis. Moderate mitral annular calcification.  5. The aortic valve is tricuspid. There is moderate calcification of the aortic valve. Aortic valve regurgitation is not visualized. Aortic valve sclerosis/calcification is present, without any evidence of aortic stenosis.  6. The inferior vena cava is dilated in size with >50% respiratory variability, suggesting right atrial pressure of 8 mmHg. FINDINGS  Left Ventricle: Left ventricular ejection fraction, by estimation, is 55 to 60%. The left ventricle has normal function. The left ventricle has no regional wall motion abnormalities. The left ventricular internal cavity size was normal in size. There is  moderate concentric left ventricular hypertrophy. Left ventricular diastolic parameters are consistent with Grade I diastolic dysfunction (impaired relaxation). Right Ventricle: The right ventricular size is mildly enlarged. Right vetricular wall thickness was not well visualized. Right ventricular systolic function is mildly reduced. Tricuspid regurgitation signal is inadequate for assessing PA pressure. Left Atrium: Left atrial size was mildly dilated. Right Atrium: Right atrial size was normal in size. Pericardium: There is no evidence of pericardial effusion. Mitral Valve: The mitral valve is degenerative in appearance. There is moderate calcification of the mitral valve leaflet(s). Moderate mitral annular calcification. Trivial mitral valve regurgitation. No evidence of mitral valve stenosis. Tricuspid Valve: The tricuspid valve is normal in structure. Tricuspid valve regurgitation is not demonstrated. Aortic Valve: The aortic valve is tricuspid. There is moderate calcification of the aortic valve. Aortic valve regurgitation is not visualized. Aortic valve sclerosis/calcification is present, without any evidence of aortic stenosis. Pulmonic Valve: The  pulmonic valve was not well visualized. Pulmonic valve regurgitation is not visualized. Aorta: The aortic root is normal in size and structure. Venous: The inferior vena cava is  dilated in size with greater than 50% respiratory variability, suggesting right atrial pressure of 8 mmHg. IAS/Shunts: No atrial level shunt detected by color flow Doppler.  LEFT VENTRICLE PLAX 2D LVIDd:         4.90 cm   Diastology LVIDs:         3.90 cm   LV e' medial:    4.24 cm/s LV PW:         1.30 cm   LV E/e' medial:  21.8 LV IVS:        1.10 cm   LV e' lateral:   6.64 cm/s LVOT diam:     2.00 cm   LV E/e' lateral: 13.9 LV SV:         92 LV SV Index:   42 LVOT Area:     3.14 cm  RIGHT VENTRICLE            IVC RV S prime:     9.25 cm/s  IVC diam: 2.70 cm TAPSE (M-mode): 1.4 cm LEFT ATRIUM             Index        RIGHT ATRIUM           Index LA diam:        3.30 cm 1.53 cm/m   RA Area:     15.00 cm LA Vol (A2C):   80.3 ml 37.16 ml/m  RA Volume:   41.60 ml  19.25 ml/m LA Vol (A4C):   68.1 ml 31.52 ml/m LA Biplane Vol: 76.6 ml 35.45 ml/m  AORTIC VALVE LVOT Vmax:   139.00 cm/s LVOT Vmean:  98.900 cm/s LVOT VTI:    0.292 m  AORTA Ao Root diam: 2.60 cm MITRAL VALVE MV Area (PHT): 3.26 cm     SHUNTS MV Decel Time: 233 msec     Systemic VTI:  0.29 m MV E velocity: 92.40 cm/s   Systemic Diam: 2.00 cm MV A velocity: 120.00 cm/s MV E/A ratio:  0.77 Dalton McleanMD Electronically signed by Archer Bear Signature Date/Time: 09/15/2023/10:00:31 AM    Final    CT CHEST ABDOMEN PELVIS WO CONTRAST Result Date: 09/14/2023 CLINICAL DATA:  Sepsis EXAM: CT CHEST, ABDOMEN AND PELVIS WITHOUT CONTRAST TECHNIQUE: Multidetector CT imaging of the chest, abdomen and pelvis was performed following the standard protocol without IV contrast. RADIATION DOSE REDUCTION: This exam was performed according to the departmental dose-optimization program which includes automated exposure control, adjustment of the mA and/or kV according to patient size and/or  use of iterative reconstruction technique. COMPARISON:  04/23/2023 FINDINGS: CT CHEST FINDINGS Cardiovascular: Heart size normal. No pericardial effusion. Dilated central pulmonary arteries, suggesting pulmonary hypertension. Scattered coronary and aortic calcified plaque. No TAA. Mediastinum/Nodes: No mediastinal hematoma, mass, or adenopathy. Lungs/Pleura: Moderate pleural effusions, slightly increased on the left since prior study. Dependent atelectasis posteriorly, left worse than right. Musculoskeletal: Vertebral endplate spurring at multiple levels in the mid and lower thoracic spine. Advanced bilateral shoulder DJD. CT ABDOMEN PELVIS FINDINGS Hepatobiliary: Subcapsular cystic lesion with peripheral coarse calcification in segment 8 is stable. No new liver lesion or biliary ductal dilatation. Small partially calcified gallstones layer in the dependent aspect of the nondilated gallbladder. Pancreas: Unremarkable. No pancreatic ductal dilatation or surrounding inflammatory changes. Spleen: Normal in size without focal abnormality. Adrenals/Urinary Tract: 1.3 cm right adrenal nodule, previously 1.2 cm. Symmetric renal contours without hydronephrosis. Bilateral urolithiasis, largest stone peripherally on the left in the lower pole 9 mm, on the right 5 mm  in the lower pole. Urinary bladder nondistended. Stomach/Bowel: Stomach is nondistended. Small bowel decompressed. Normal appendix. The colon is nondistended, with scattered diverticula most numerous in the distal descending and proximal sigmoid segments, with regional wall thickening but no significant adjacent inflammatory change. Vascular/Lymphatic: Mild scattered aortoiliac calcified plaque without AAA. No abdominal or pelvic adenopathy. Reproductive: Status post hysterectomy. No adnexal masses. Other: Pelvic phleboliths.  No ascites.  No free air. Musculoskeletal: Small paraumbilical hernia containing only mesenteric fat. Multilevel lumbar spondylitic change  with stable grade 1 anterolisthesis L4-5. Hip DJD worse left than right. IMPRESSION: 1. Moderate pleural effusions, slightly increased on the left since prior study. 2. Bilateral nephrolithiasis without hydronephrosis. 3. Colonic diverticulosis. 4. Cholelithiasis. 5. 1.3 cm right adrenal nodule, previously 1.2 cm. 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 Consider 12 month follow-up adrenal protocol CT. 6. Coronary and aortic Atherosclerosis (ICD10-I70.0). Electronically Signed   By: Nicoletta Barrier M.D.   On: 09/14/2023 14:19   CT Head Wo Contrast Result Date: 09/14/2023 CLINICAL DATA:  Mental status change, unknown cause, dementia EXAM: CT HEAD WITHOUT CONTRAST TECHNIQUE: Contiguous axial images were obtained from the base of the skull through the vertex without intravenous contrast. RADIATION DOSE REDUCTION: This exam was performed according to the departmental dose-optimization program which includes automated exposure control, adjustment of the mA and/or kV according to patient size and/or use of iterative reconstruction technique. COMPARISON:  10/14/2022, 08/06/2022 FINDINGS: Brain: Minor periventricular white matter microvascular ischemic changes bilaterally. No acute intracranial hemorrhage new infarction, mass lesion, midline shift, herniation, hydrocephalus, or extra-axial fluid collection. No focal mass effect or edema. Cisterns are patent. No cerebellar abnormality. Vascular: No hyperdense vessel or unexpected calcification. Skull: No acute osseous finding fracture. Left mastoid effusion noted. Right mastoid is clear. Sinuses/Orbits: Chronic complete opacification of the right maxillary sinus with surrounding maxillary wall hyperostosis as well as partially calcified soft tissue protrusion into the nasal cavity. No orbital abnormality. Other: None. IMPRESSION: 1. No acute intracranial abnormality by noncontrast CT. 2. Minor white matter microvascular ischemic changes. 3. Stable chronic  right maxillary sinus disease with soft tissue protrusion into the nasal cavity, potentially reflecting a chronic mycetoma. 4. Left mastoid effusion. Electronically Signed   By: Melven Stable.  Shick M.D.   On: 09/14/2023 14:19   DG Chest Port 1 View Result Date: 09/14/2023 CLINICAL DATA:  Questionable sepsis. EXAM: PORTABLE CHEST 1 VIEW COMPARISON:  05/12/2021 FINDINGS: The cardiomediastinal contours are unremarkable. There is asymmetric veil like opacification over the left mid and left lower lung. Mild thickening of the minor fissure within the right midlung is noted with blunting of the right costophrenic angle. No airspace consolidation identified. IMPRESSION: 1. Asymmetric veil like opacification over the left mid and left lower lung compatible with layering pleural effusion. 2. Blunting of the right costophrenic angle compatible with small right pleural effusion. Electronically Signed   By: Kimberley Penman M.D.   On: 09/14/2023 13:55    Microbiology: Results for orders placed or performed during the hospital encounter of 09/14/23  Blood Culture (routine x 2)     Status: None (Preliminary result)   Collection Time: 09/14/23  1:00 PM   Specimen: BLOOD  Result Value Ref Range Status   Specimen Description   Final    BLOOD BLOOD LEFT HAND Performed at [facility], [address], [place], [place] [address]    Special Requests   Final    BOTTLES DRAWN AEROBIC AND ANAEROBIC Blood Culture results may not be  optimal due to an inadequate volume of blood received in culture bottles Performed at [facility], [address], [place], [place] [address]    Culture   Final    NO GROWTH 4 DAYS Performed at [facility], [address] [place], [place] [address]    Report Status PENDING  Incomplete  Blood Culture (routine x 2)     Status: Abnormal   Collection Time: 09/14/23  1:00 PM   Specimen: BLOOD  Result Value Ref Range Status   Specimen Description   Final     BLOOD BLOOD RIGHT ARM Performed at [facility], [address], [place], [place] [address]    Special Requests   Final    BOTTLES DRAWN AEROBIC AND ANAEROBIC Blood Culture results may not be optimal due to an inadequate volume of blood received in culture bottles Performed at [facility], [address] [place], [place] [address]    Culture  Setup Time   Final    GRAM POSITIVE COCCI IN BOTH AEROBIC AND ANAEROBIC BOTTLES Organism ID to follow CRITICAL RESULT CALLED TO, READ BACK BY AND VERIFIED WITH: Jesslyn Moro PHARMD, AT 0845 09/15/23 D. VANHOOK    Culture (A)  Final    STAPHYLOCOCCUS HOMINIS STAPHYLOCOCCUS EPIDERMIDIS THE SIGNIFICANCE OF ISOLATING THIS ORGANISM FROM A SINGLE SET OF BLOOD CULTURES WHEN MULTIPLE SETS ARE DRAWN IS UNCERTAIN. PLEASE NOTIFY THE MICROBIOLOGY DEPARTMENT WITHIN ONE WEEK IF SPECIATION AND SENSITIVITIES ARE REQUIRED. Performed at [facility], [address] [place], [place] [address]    Report Status 09/16/2023 FINAL  Final  Blood Culture ID Panel (Reflexed)     Status: Abnormal   Collection Time: 09/14/23  1:00 PM  Result Value Ref Range Status   Enterococcus faecalis NOT DETECTED NOT DETECTED Final   Enterococcus Faecium NOT DETECTED NOT DETECTED Final   Listeria monocytogenes NOT DETECTED NOT DETECTED Final   Staphylococcus species DETECTED (A) NOT DETECTED Final    Comment: CRITICAL RESULT CALLED TO, READ BACK BY AND VERIFIED WITH: JArmida Berry PHARMD, AT 0845 09/15/23 D. VANHOOK    Staphylococcus aureus (BCID) NOT DETECTED NOT DETECTED Final   Staphylococcus epidermidis DETECTED (A) NOT DETECTED Final    Comment: Methicillin (oxacillin) resistant coagulase negative staphylococcus. Possible blood culture contaminant (unless isolated from more than one blood culture draw or clinical case suggests pathogenicity). No antibiotic treatment is indicated for blood  culture contaminants. CRITICAL RESULT CALLED TO,  READ BACK BY AND VERIFIED WITH: Jesslyn Moro PHARMD, AT 0845 09/15/23 D. VANHOOK    Staphylococcus lugdunensis NOT DETECTED NOT DETECTED Final   Streptococcus species NOT DETECTED NOT DETECTED Final   Streptococcus agalactiae NOT DETECTED NOT DETECTED Final   Streptococcus pneumoniae NOT DETECTED NOT DETECTED Final   Streptococcus pyogenes NOT DETECTED NOT DETECTED Final   A.calcoaceticus-baumannii NOT DETECTED NOT DETECTED Final   Bacteroides fragilis NOT DETECTED NOT DETECTED Final   Enterobacterales NOT DETECTED NOT DETECTED Final   Enterobacter cloacae complex NOT DETECTED NOT DETECTED Final   Escherichia coli NOT DETECTED NOT DETECTED Final   Klebsiella aerogenes NOT DETECTED NOT DETECTED Final   Klebsiella oxytoca NOT DETECTED NOT DETECTED Final   Klebsiella pneumoniae NOT DETECTED NOT DETECTED Final   Proteus species NOT DETECTED NOT DETECTED Final   Salmonella species NOT DETECTED NOT DETECTED Final   Serratia marcescens NOT DETECTED NOT DETECTED Final   Haemophilus influenzae NOT DETECTED NOT DETECTED Final   Neisseria meningitidis NOT DETECTED NOT  DETECTED Final   Pseudomonas aeruginosa NOT DETECTED NOT DETECTED Final   Stenotrophomonas maltophilia NOT DETECTED NOT DETECTED Final   Candida albicans NOT DETECTED NOT DETECTED Final   Candida auris NOT DETECTED NOT DETECTED Final   Candida glabrata NOT DETECTED NOT DETECTED Final   Candida krusei NOT DETECTED NOT DETECTED Final   Candida parapsilosis NOT DETECTED NOT DETECTED Final   Candida tropicalis NOT DETECTED NOT DETECTED Final   Cryptococcus neoformans/gattii NOT DETECTED NOT DETECTED Final   Methicillin resistance mecA/C DETECTED (A) NOT DETECTED Final    Comment: CRITICAL RESULT CALLED TO, READ BACK BY AND VERIFIED WITH: JArmida Berry PHARMD, AT 0845 09/15/23 D. VANHOOK Performed at [facility], [address], [place], [place] [address]   MRSA Next Gen by PCR, Nasal     Status: Abnormal   Collection Time: 09/14/23   5:56 PM   Specimen: Nasal Mucosa; Nasal Swab  Result Value Ref Range Status   MRSA by PCR Next Gen DETECTED (A) NOT DETECTED Final    Comment: RESULT CALLED TO, READ BACK BY AND VERIFIED WITH: Gisele Lamas, RN 2312 09/14/23 BY Gearld Keep (NOTE) The GeneXpert MRSA Assay (FDA approved for NASAL specimens only), is one component of a comprehensive MRSA colonization surveillance program. It is not intended to diagnose MRSA infection nor to guide or monitor treatment for MRSA infections. Test performance is not FDA approved in patients less than 60 years old. Performed at [facility], [address], [place], [place] [address]   Respiratory (~20 pathogens) panel by PCR     Status: None   Collection Time: 09/15/23  4:12 PM   Specimen: Nasopharyngeal Swab; Respiratory  Result Value Ref Range Status   Adenovirus NOT DETECTED NOT DETECTED Final   Coronavirus 229E NOT DETECTED NOT DETECTED Final    Comment: (NOTE) The Coronavirus on the Respiratory Panel, DOES NOT test for the novel  Coronavirus (2019 nCoV)    Coronavirus HKU1 NOT DETECTED NOT DETECTED Final   Coronavirus NL63 NOT DETECTED NOT DETECTED Final   Coronavirus OC43 NOT DETECTED NOT DETECTED Final   Metapneumovirus NOT DETECTED NOT DETECTED Final   Rhinovirus / Enterovirus NOT DETECTED NOT DETECTED Final   Influenza A NOT DETECTED NOT DETECTED Final   Influenza B NOT DETECTED NOT DETECTED Final   Parainfluenza Virus 1 NOT DETECTED NOT DETECTED Final   Parainfluenza Virus 2 NOT DETECTED NOT DETECTED Final   Parainfluenza Virus 3 NOT DETECTED NOT DETECTED Final   Parainfluenza Virus 4 NOT DETECTED NOT DETECTED Final   Respiratory Syncytial Virus NOT DETECTED NOT DETECTED Final   Bordetella pertussis NOT DETECTED NOT DETECTED Final   Bordetella Parapertussis NOT DETECTED NOT DETECTED Final   Chlamydophila pneumoniae NOT DETECTED NOT DETECTED Final   Mycoplasma pneumoniae NOT DETECTED NOT DETECTED Final     Comment: Performed at [facility], [address] [place], [place] [address]  Resp panel by RT-PCR (RSV, Flu A&B, Covid) Anterior Nasal Swab     Status: None   Collection Time: 09/15/23  4:12 PM   Specimen: Anterior Nasal Swab  Result Value Ref Range Status   SARS Coronavirus 2 by RT PCR NEGATIVE NEGATIVE Final    Comment: (NOTE) SARS-CoV-2 target nucleic acids are NOT DETECTED.  The SARS-CoV-2 RNA is generally detectable in upper respiratory specimens during the acute phase of infection. The lowest concentration of SARS-CoV-2 viral copies this assay can detect is 138 copies/mL. A negative result does not preclude SARS-Cov-2  infection and should not be used as the sole basis for treatment or other patient management decisions. A negative result may occur with  improper specimen collection/handling, submission of specimen other than nasopharyngeal swab, presence of viral mutation(s) within the areas targeted by this assay, and inadequate number of viral copies(<138 copies/mL). A negative result must be combined with clinical observations, patient history, and epidemiological information. The expected result is Negative.  Fact Sheet for Patients:  BloggerCourse.com  Fact Sheet for Healthcare Providers:  SeriousBroker.it  This test is no t yet approved or cleared by the [place]  FDA and  has been authorized for detection and/or diagnosis of SARS-CoV-2 by FDA under an Emergency Use Authorization (EUA). This EUA will remain  in effect (meaning this test can be used) for the duration of the COVID-19 declaration under Section 564(b)(1) of the Act, 21 U.S.C.section 360bbb-3(b)(1), unless the authorization is terminated  or revoked sooner.       Influenza A by PCR NEGATIVE NEGATIVE Final   Influenza B by PCR NEGATIVE NEGATIVE Final    Comment: (NOTE) The Xpert Xpress SARS-CoV-2/FLU/RSV plus assay is intended as an aid in  the diagnosis of influenza from Nasopharyngeal swab specimens and should not be used as a sole basis for treatment. Nasal washings and aspirates are unacceptable for Xpert Xpress SARS-CoV-2/FLU/RSV testing.  Fact Sheet for Patients: BloggerCourse.com  Fact Sheet for Healthcare Providers: SeriousBroker.it  This test is not yet approved or cleared by the [place]  FDA and has been authorized for detection and/or diagnosis of SARS-CoV-2 by FDA under an Emergency Use Authorization (EUA). This EUA will remain in effect (meaning this test can be used) for the duration of the COVID-19 declaration under Section 564(b)(1) of the Act, 21 U.S.C. section 360bbb-3(b)(1), unless the authorization is terminated or revoked.     Resp Syncytial Virus by PCR NEGATIVE NEGATIVE Final    Comment: (NOTE) Fact Sheet for Patients: BloggerCourse.com  Fact Sheet for Healthcare Providers: SeriousBroker.it  This test is not yet approved or cleared by the [place]  FDA and has been authorized for detection and/or diagnosis of SARS-CoV-2 by FDA under an Emergency Use Authorization (EUA). This EUA will remain in effect (meaning this test can be used) for the duration of the COVID-19 declaration under Section 564(b)(1) of the Act, 21 U.S.C. section 360bbb-3(b)(1), unless the authorization is terminated or revoked.  Performed at [facility], [address] [place], [place] [address]     Labs: CBC: Recent Labs  Lab 09/14/23 1300 09/15/23 0331 09/16/23 0303 09/17/23 0426 09/18/23 0404  WBC 3.2* 3.2* 6.3 9.6 8.0  NEUTROABS 2.1  --  5.4  --  5.9  HGB 9.9* 10.7* 9.7* 9.8* 9.8*  HCT 36.2 38.2 32.5* 32.3* 32.2*  MCV 105.5* 103.5* 99.4 95.8 97.0  PLT 171 193 197 200 195   Basic Metabolic Panel: Recent Labs  Lab 09/14/23 1300 09/15/23 0331 09/16/23 0303 09/17/23 0426  09/18/23 0404  NA 141 141 131* 131* 128*  K 4.9 4.3 3.7 3.8 3.8  CL 91* 89* 82* 81* 79*  CO2 37* 36* 38* 42* 40*  GLUCOSE 84 102* 149* 113* 120*  BUN 12 17 25* 35* 41*  CREATININE 0.48 0.58 0.77 0.88 0.90  CALCIUM  9.3 9.7 8.9 9.2 8.6*  MG 1.7 1.5* 2.0  --  2.1  PHOS  --  2.2* 3.2 4.1  --    Liver Function Tests: Recent Labs  Lab 09/14/23 1300 09/16/23 0303 09/17/23 0426 09/18/23 0404  AST 13*  --   --  11*  ALT 8  --   --  12  ALKPHOS 51  --   --  41  BILITOT 0.5  --   --  0.5  PROT 6.5  --   --  5.7*  ALBUMIN 2.8* 2.8* 2.9* 2.7*   CBG: Recent Labs  Lab 09/17/23 1105 09/17/23 1642 09/17/23 2048 09/18/23 0747 09/18/23 1112  GLUCAP 125* 171* 202* 112* 161*    Discharge time spent: greater than 30 minutes.  Signed: True Fuss, MD [facility] 09/18/2023

## 2023-09-18 NOTE — Assessment & Plan Note (Deleted)
 Thought to be secondary to acute on chronic diastolic congestive heart failure with bilateral pleural effusions superimposed on possible reactive airway disease Clinically improved, now off of BiPAP, weaning nasal cannula Continuing daily prednisone Continuing bronchodilator therapy Patient has now been transitioned to oral furosemide.

## 2023-09-18 NOTE — TOC Transition Note (Signed)
 Transition of Care [facility]) - Discharge Note   Patient Details  Name: Elizabeth [name] MRN: [id number] Date of Birth: 01-Sep-1940  Transition of Care [facility]) CM/SW Contact:  Gertha Ku, LCSW Phone Number: 09/18/2023, 2:39 PM   Clinical Narrative:      Pt to d/c back to [facility] LTC facility. Pt's room 125 , RN to call report to [phone]. CSW spoke with pt's daughter to inform her of transfer , she agreed with plan. PTAR called ,TOC sign off.    Final next level of care: Long Term Nursing Home Barriers to Discharge: Barriers Resolved   Patient Goals and CMS Choice Patient states their goals for this hospitalization and ongoing recovery are:: per dtr, return to LTC at [facility].gov Compare Post Acute Care list provided to:: Patient Represenative (must comment) (Hoelzer(POA),[name]  [phone] (Mobile)) Choice offered to / list presented to : [facility] POA / Guardian (Daudelin(POA),[name]  [phone] (Mobile)) [place] ownership interest in [facility].provided to:: [facility] POA / Guardian (Limb(POA),[name]  [phone] (Mobile))    Discharge Placement                Patient to be transferred to facility by: EMS Name of family member notified: Falk(POA),[name]  [phone] (Mobile) Patient and family notified of of transfer: 09/18/23  Discharge Plan and Services Additional resources added to the After Visit Summary for                                       Social Drivers of Health (SDOH) Interventions SDOH Screenings   Food Insecurity: Patient Unable To Answer (09/16/2023)  Housing: Patient Unable To Answer (09/16/2023)  Transportation Needs: Patient Unable To Answer (09/16/2023)  Utilities: Patient Unable To Answer (09/16/2023)  Social Connections: Patient Unable To Answer (09/16/2023)  Tobacco Use: Medium Risk (09/14/2023)     Readmission Risk Interventions    04/28/2023    11:56 AM  Readmission Risk Prevention Plan  Transportation Screening Complete  PCP or Specialist Appt within 5-7 Days Complete  Home Care Screening Complete  Medication Review (RN CM) Complete

## 2023-09-18 NOTE — Assessment & Plan Note (Deleted)
 Continuing bronchodilator therapy Transition to burst of oral prednisone

## 2023-09-18 NOTE — TOC Progression Note (Addendum)
 Transition of Care [facility]) - Progression Note    Patient Details  Name: Elizabeth [name] MRN: [id number] Date of Birth: 1940-08-31  Transition of Care [facility] CM/SW Contact  Gertha Ku, LCSW Phone Number: 09/18/2023, 10:28 AM  Clinical Narrative:     Received a message that the pt's insurance authorization for SNF placement has been sent for peer-to-peer review. Call [phone], option #5, must be completed by 1:00 PM today.MD made aware. TOC to follow.   Adden  11:23 am Pt's insurance authorization for SNF placement was denied. CSW spoke with the pt and the pt's daughter to inform them about the denial. Pt will need to transfer back to [facility] for long-term care. TOC to follow.  .   Expected Discharge Plan: Skilled Nursing Facility Barriers to Discharge: Continued Medical Work up  Expected Discharge Plan and Services       Living arrangements for the past 2 months: Skilled Nursing Facility [facility])                                       Social Determinants of Health (SDOH) Interventions SDOH Screenings   Food Insecurity: Patient Unable To Answer (09/16/2023)  Housing: Patient Unable To Answer (09/16/2023)  Transportation Needs: Patient Unable To Answer (09/16/2023)  Utilities: Patient Unable To Answer (09/16/2023)  Social Connections: Patient Unable To Answer (09/16/2023)  Tobacco Use: Medium Risk (09/14/2023)    Readmission Risk Interventions    04/28/2023   11:56 AM  Readmission Risk Prevention Plan  Transportation Screening Complete  PCP or Specialist Appt within 5-7 Days Complete  Home Care Screening Complete  Medication Review (RN CM) Complete

## 2023-09-18 NOTE — Assessment & Plan Note (Deleted)
 Were laced

## 2023-09-19 ENCOUNTER — Ambulatory Visit

## 2023-09-19 LAB — CULTURE, BLOOD (ROUTINE X 2): Culture: NO GROWTH

## 2023-09-23 NOTE — Telephone Encounter (Signed)
 Will follow up at routine follow up appointment

## 2023-09-23 NOTE — Telephone Encounter (Signed)
 Received this response from the sleep lab    Hi Elizabeth [name], after speaking with the patients daughter this morning the [facility] would not be able to accommodate the patient. Per daughter the patient is non-ambulatory and requires transfers via hoya lift. Due to a fall she can no longer stand or walk.

## 2023-10-21 ENCOUNTER — Telehealth (HOSPITAL_BASED_OUTPATIENT_CLINIC_OR_DEPARTMENT_OTHER): Payer: Self-pay

## 2023-10-21 NOTE — Telephone Encounter (Signed)
 Copied from CRM [id number]. Topic: Clinical - Medical Advice >> Oct 21, 2023  9:29 AM Corean SAUNDERS wrote: Reason for CRM: Patients daughter states the patient is refusing to wear her CPAP mask as she feels its suffocates her, daughter states patients dementia may play a role in this.  Daughter is requesting advice from Dr. Neda on this matter. Please call daughter Leonetta) back at [phone]

## 2023-10-22 DIAGNOSIS — Z741 Need for assistance with personal care: Secondary | ICD-10-CM | POA: Diagnosis not present

## 2023-10-22 DIAGNOSIS — R262 Difficulty in walking, not elsewhere classified: Secondary | ICD-10-CM | POA: Diagnosis not present

## 2023-10-22 DIAGNOSIS — I509 Heart failure, unspecified: Secondary | ICD-10-CM | POA: Diagnosis not present

## 2023-10-22 DIAGNOSIS — Z9181 History of falling: Secondary | ICD-10-CM | POA: Diagnosis not present

## 2023-10-22 DIAGNOSIS — N189 Chronic kidney disease, unspecified: Secondary | ICD-10-CM | POA: Diagnosis not present

## 2023-10-22 DIAGNOSIS — I69919 Unspecified symptoms and signs involving cognitive functions following unspecified cerebrovascular disease: Secondary | ICD-10-CM | POA: Diagnosis not present

## 2023-10-22 DIAGNOSIS — E119 Type 2 diabetes mellitus without complications: Secondary | ICD-10-CM | POA: Diagnosis not present

## 2023-10-22 DIAGNOSIS — M6281 Muscle weakness (generalized): Secondary | ICD-10-CM | POA: Diagnosis not present

## 2023-10-22 DIAGNOSIS — I69991 Dysphagia following unspecified cerebrovascular disease: Secondary | ICD-10-CM | POA: Diagnosis not present

## 2023-10-23 DIAGNOSIS — Z9181 History of falling: Secondary | ICD-10-CM | POA: Diagnosis not present

## 2023-10-23 DIAGNOSIS — I509 Heart failure, unspecified: Secondary | ICD-10-CM | POA: Diagnosis not present

## 2023-10-23 DIAGNOSIS — R262 Difficulty in walking, not elsewhere classified: Secondary | ICD-10-CM | POA: Diagnosis not present

## 2023-10-23 DIAGNOSIS — N189 Chronic kidney disease, unspecified: Secondary | ICD-10-CM | POA: Diagnosis not present

## 2023-10-23 DIAGNOSIS — I69919 Unspecified symptoms and signs involving cognitive functions following unspecified cerebrovascular disease: Secondary | ICD-10-CM | POA: Diagnosis not present

## 2023-10-23 DIAGNOSIS — I69991 Dysphagia following unspecified cerebrovascular disease: Secondary | ICD-10-CM | POA: Diagnosis not present

## 2023-10-23 DIAGNOSIS — Z741 Need for assistance with personal care: Secondary | ICD-10-CM | POA: Diagnosis not present

## 2023-10-23 DIAGNOSIS — E119 Type 2 diabetes mellitus without complications: Secondary | ICD-10-CM | POA: Diagnosis not present

## 2023-10-23 DIAGNOSIS — M6281 Muscle weakness (generalized): Secondary | ICD-10-CM | POA: Diagnosis not present

## 2023-10-24 DIAGNOSIS — I69919 Unspecified symptoms and signs involving cognitive functions following unspecified cerebrovascular disease: Secondary | ICD-10-CM | POA: Diagnosis not present

## 2023-10-24 DIAGNOSIS — Z741 Need for assistance with personal care: Secondary | ICD-10-CM | POA: Diagnosis not present

## 2023-10-24 DIAGNOSIS — E119 Type 2 diabetes mellitus without complications: Secondary | ICD-10-CM | POA: Diagnosis not present

## 2023-10-24 DIAGNOSIS — I69991 Dysphagia following unspecified cerebrovascular disease: Secondary | ICD-10-CM | POA: Diagnosis not present

## 2023-10-24 DIAGNOSIS — N189 Chronic kidney disease, unspecified: Secondary | ICD-10-CM | POA: Diagnosis not present

## 2023-10-24 DIAGNOSIS — M6281 Muscle weakness (generalized): Secondary | ICD-10-CM | POA: Diagnosis not present

## 2023-10-24 DIAGNOSIS — I509 Heart failure, unspecified: Secondary | ICD-10-CM | POA: Diagnosis not present

## 2023-10-24 DIAGNOSIS — R262 Difficulty in walking, not elsewhere classified: Secondary | ICD-10-CM | POA: Diagnosis not present

## 2023-10-24 DIAGNOSIS — Z9181 History of falling: Secondary | ICD-10-CM | POA: Diagnosis not present

## 2023-10-25 DIAGNOSIS — I69919 Unspecified symptoms and signs involving cognitive functions following unspecified cerebrovascular disease: Secondary | ICD-10-CM | POA: Diagnosis not present

## 2023-10-25 DIAGNOSIS — N189 Chronic kidney disease, unspecified: Secondary | ICD-10-CM | POA: Diagnosis not present

## 2023-10-25 DIAGNOSIS — M6281 Muscle weakness (generalized): Secondary | ICD-10-CM | POA: Diagnosis not present

## 2023-10-25 DIAGNOSIS — Z741 Need for assistance with personal care: Secondary | ICD-10-CM | POA: Diagnosis not present

## 2023-10-25 DIAGNOSIS — I69991 Dysphagia following unspecified cerebrovascular disease: Secondary | ICD-10-CM | POA: Diagnosis not present

## 2023-10-25 DIAGNOSIS — Z9181 History of falling: Secondary | ICD-10-CM | POA: Diagnosis not present

## 2023-10-25 DIAGNOSIS — E119 Type 2 diabetes mellitus without complications: Secondary | ICD-10-CM | POA: Diagnosis not present

## 2023-10-25 DIAGNOSIS — R262 Difficulty in walking, not elsewhere classified: Secondary | ICD-10-CM | POA: Diagnosis not present

## 2023-10-25 DIAGNOSIS — I509 Heart failure, unspecified: Secondary | ICD-10-CM | POA: Diagnosis not present

## 2023-10-26 DIAGNOSIS — I509 Heart failure, unspecified: Secondary | ICD-10-CM | POA: Diagnosis not present

## 2023-10-26 DIAGNOSIS — M6281 Muscle weakness (generalized): Secondary | ICD-10-CM | POA: Diagnosis not present

## 2023-10-26 DIAGNOSIS — N189 Chronic kidney disease, unspecified: Secondary | ICD-10-CM | POA: Diagnosis not present

## 2023-10-26 DIAGNOSIS — I69991 Dysphagia following unspecified cerebrovascular disease: Secondary | ICD-10-CM | POA: Diagnosis not present

## 2023-10-26 DIAGNOSIS — I69919 Unspecified symptoms and signs involving cognitive functions following unspecified cerebrovascular disease: Secondary | ICD-10-CM | POA: Diagnosis not present

## 2023-10-26 DIAGNOSIS — Z741 Need for assistance with personal care: Secondary | ICD-10-CM | POA: Diagnosis not present

## 2023-10-26 DIAGNOSIS — Z9181 History of falling: Secondary | ICD-10-CM | POA: Diagnosis not present

## 2023-10-26 DIAGNOSIS — E119 Type 2 diabetes mellitus without complications: Secondary | ICD-10-CM | POA: Diagnosis not present

## 2023-10-26 DIAGNOSIS — R262 Difficulty in walking, not elsewhere classified: Secondary | ICD-10-CM | POA: Diagnosis not present

## 2023-10-27 DIAGNOSIS — M6281 Muscle weakness (generalized): Secondary | ICD-10-CM | POA: Diagnosis not present

## 2023-10-27 DIAGNOSIS — R262 Difficulty in walking, not elsewhere classified: Secondary | ICD-10-CM | POA: Diagnosis not present

## 2023-10-27 DIAGNOSIS — Z9181 History of falling: Secondary | ICD-10-CM | POA: Diagnosis not present

## 2023-10-27 DIAGNOSIS — N189 Chronic kidney disease, unspecified: Secondary | ICD-10-CM | POA: Diagnosis not present

## 2023-10-27 DIAGNOSIS — Z741 Need for assistance with personal care: Secondary | ICD-10-CM | POA: Diagnosis not present

## 2023-10-27 DIAGNOSIS — I69991 Dysphagia following unspecified cerebrovascular disease: Secondary | ICD-10-CM | POA: Diagnosis not present

## 2023-10-27 DIAGNOSIS — I69919 Unspecified symptoms and signs involving cognitive functions following unspecified cerebrovascular disease: Secondary | ICD-10-CM | POA: Diagnosis not present

## 2023-10-27 DIAGNOSIS — E119 Type 2 diabetes mellitus without complications: Secondary | ICD-10-CM | POA: Diagnosis not present

## 2023-10-27 DIAGNOSIS — I509 Heart failure, unspecified: Secondary | ICD-10-CM | POA: Diagnosis not present

## 2023-10-28 DIAGNOSIS — R262 Difficulty in walking, not elsewhere classified: Secondary | ICD-10-CM | POA: Diagnosis not present

## 2023-10-28 DIAGNOSIS — Z741 Need for assistance with personal care: Secondary | ICD-10-CM | POA: Diagnosis not present

## 2023-10-28 DIAGNOSIS — J45909 Unspecified asthma, uncomplicated: Secondary | ICD-10-CM | POA: Diagnosis not present

## 2023-10-28 DIAGNOSIS — Z794 Long term (current) use of insulin: Secondary | ICD-10-CM | POA: Diagnosis not present

## 2023-10-28 DIAGNOSIS — M6281 Muscle weakness (generalized): Secondary | ICD-10-CM | POA: Diagnosis not present

## 2023-10-28 DIAGNOSIS — R413 Other amnesia: Secondary | ICD-10-CM | POA: Diagnosis not present

## 2023-10-28 DIAGNOSIS — Z9181 History of falling: Secondary | ICD-10-CM | POA: Diagnosis not present

## 2023-10-28 DIAGNOSIS — I509 Heart failure, unspecified: Secondary | ICD-10-CM | POA: Diagnosis not present

## 2023-10-28 DIAGNOSIS — E119 Type 2 diabetes mellitus without complications: Secondary | ICD-10-CM | POA: Diagnosis not present

## 2023-10-28 DIAGNOSIS — I2510 Atherosclerotic heart disease of native coronary artery without angina pectoris: Secondary | ICD-10-CM | POA: Diagnosis not present

## 2023-10-28 DIAGNOSIS — I69991 Dysphagia following unspecified cerebrovascular disease: Secondary | ICD-10-CM | POA: Diagnosis not present

## 2023-10-28 DIAGNOSIS — I69919 Unspecified symptoms and signs involving cognitive functions following unspecified cerebrovascular disease: Secondary | ICD-10-CM | POA: Diagnosis not present

## 2023-10-28 DIAGNOSIS — N189 Chronic kidney disease, unspecified: Secondary | ICD-10-CM | POA: Diagnosis not present

## 2023-10-28 DIAGNOSIS — I5032 Chronic diastolic (congestive) heart failure: Secondary | ICD-10-CM | POA: Diagnosis not present

## 2023-10-28 DIAGNOSIS — I110 Hypertensive heart disease with heart failure: Secondary | ICD-10-CM | POA: Diagnosis not present

## 2023-10-29 DIAGNOSIS — N189 Chronic kidney disease, unspecified: Secondary | ICD-10-CM | POA: Diagnosis not present

## 2023-10-29 DIAGNOSIS — I509 Heart failure, unspecified: Secondary | ICD-10-CM | POA: Diagnosis not present

## 2023-10-29 DIAGNOSIS — Z9181 History of falling: Secondary | ICD-10-CM | POA: Diagnosis not present

## 2023-10-29 DIAGNOSIS — Z741 Need for assistance with personal care: Secondary | ICD-10-CM | POA: Diagnosis not present

## 2023-10-29 DIAGNOSIS — M6281 Muscle weakness (generalized): Secondary | ICD-10-CM | POA: Diagnosis not present

## 2023-10-29 DIAGNOSIS — E119 Type 2 diabetes mellitus without complications: Secondary | ICD-10-CM | POA: Diagnosis not present

## 2023-10-29 DIAGNOSIS — I69991 Dysphagia following unspecified cerebrovascular disease: Secondary | ICD-10-CM | POA: Diagnosis not present

## 2023-10-29 DIAGNOSIS — R262 Difficulty in walking, not elsewhere classified: Secondary | ICD-10-CM | POA: Diagnosis not present

## 2023-10-29 DIAGNOSIS — I69919 Unspecified symptoms and signs involving cognitive functions following unspecified cerebrovascular disease: Secondary | ICD-10-CM | POA: Diagnosis not present

## 2023-10-30 DIAGNOSIS — Z741 Need for assistance with personal care: Secondary | ICD-10-CM | POA: Diagnosis not present

## 2023-10-30 DIAGNOSIS — I10 Essential (primary) hypertension: Secondary | ICD-10-CM | POA: Diagnosis not present

## 2023-10-30 DIAGNOSIS — N189 Chronic kidney disease, unspecified: Secondary | ICD-10-CM | POA: Diagnosis not present

## 2023-10-30 DIAGNOSIS — I69991 Dysphagia following unspecified cerebrovascular disease: Secondary | ICD-10-CM | POA: Diagnosis not present

## 2023-10-30 DIAGNOSIS — E119 Type 2 diabetes mellitus without complications: Secondary | ICD-10-CM | POA: Diagnosis not present

## 2023-10-30 DIAGNOSIS — I69919 Unspecified symptoms and signs involving cognitive functions following unspecified cerebrovascular disease: Secondary | ICD-10-CM | POA: Diagnosis not present

## 2023-10-30 DIAGNOSIS — J9612 Chronic respiratory failure with hypercapnia: Secondary | ICD-10-CM | POA: Diagnosis not present

## 2023-10-30 DIAGNOSIS — M6281 Muscle weakness (generalized): Secondary | ICD-10-CM | POA: Diagnosis not present

## 2023-10-30 DIAGNOSIS — G4730 Sleep apnea, unspecified: Secondary | ICD-10-CM | POA: Diagnosis not present

## 2023-10-30 DIAGNOSIS — Z9181 History of falling: Secondary | ICD-10-CM | POA: Diagnosis not present

## 2023-10-30 DIAGNOSIS — I5030 Unspecified diastolic (congestive) heart failure: Secondary | ICD-10-CM | POA: Diagnosis not present

## 2023-10-30 DIAGNOSIS — I509 Heart failure, unspecified: Secondary | ICD-10-CM | POA: Diagnosis not present

## 2023-10-30 DIAGNOSIS — R262 Difficulty in walking, not elsewhere classified: Secondary | ICD-10-CM | POA: Diagnosis not present

## 2023-10-31 DIAGNOSIS — E119 Type 2 diabetes mellitus without complications: Secondary | ICD-10-CM | POA: Diagnosis not present

## 2023-10-31 DIAGNOSIS — N189 Chronic kidney disease, unspecified: Secondary | ICD-10-CM | POA: Diagnosis not present

## 2023-10-31 DIAGNOSIS — I69919 Unspecified symptoms and signs involving cognitive functions following unspecified cerebrovascular disease: Secondary | ICD-10-CM | POA: Diagnosis not present

## 2023-10-31 DIAGNOSIS — Z741 Need for assistance with personal care: Secondary | ICD-10-CM | POA: Diagnosis not present

## 2023-10-31 DIAGNOSIS — I69991 Dysphagia following unspecified cerebrovascular disease: Secondary | ICD-10-CM | POA: Diagnosis not present

## 2023-10-31 DIAGNOSIS — I509 Heart failure, unspecified: Secondary | ICD-10-CM | POA: Diagnosis not present

## 2023-10-31 DIAGNOSIS — R262 Difficulty in walking, not elsewhere classified: Secondary | ICD-10-CM | POA: Diagnosis not present

## 2023-10-31 DIAGNOSIS — Z9181 History of falling: Secondary | ICD-10-CM | POA: Diagnosis not present

## 2023-10-31 DIAGNOSIS — M6281 Muscle weakness (generalized): Secondary | ICD-10-CM | POA: Diagnosis not present

## 2023-11-01 DIAGNOSIS — M6281 Muscle weakness (generalized): Secondary | ICD-10-CM | POA: Diagnosis not present

## 2023-11-01 DIAGNOSIS — N189 Chronic kidney disease, unspecified: Secondary | ICD-10-CM | POA: Diagnosis not present

## 2023-11-01 DIAGNOSIS — I69919 Unspecified symptoms and signs involving cognitive functions following unspecified cerebrovascular disease: Secondary | ICD-10-CM | POA: Diagnosis not present

## 2023-11-01 DIAGNOSIS — E119 Type 2 diabetes mellitus without complications: Secondary | ICD-10-CM | POA: Diagnosis not present

## 2023-11-01 DIAGNOSIS — R262 Difficulty in walking, not elsewhere classified: Secondary | ICD-10-CM | POA: Diagnosis not present

## 2023-11-01 DIAGNOSIS — I509 Heart failure, unspecified: Secondary | ICD-10-CM | POA: Diagnosis not present

## 2023-11-01 DIAGNOSIS — Z741 Need for assistance with personal care: Secondary | ICD-10-CM | POA: Diagnosis not present

## 2023-11-01 DIAGNOSIS — Z9181 History of falling: Secondary | ICD-10-CM | POA: Diagnosis not present

## 2023-11-01 DIAGNOSIS — I69991 Dysphagia following unspecified cerebrovascular disease: Secondary | ICD-10-CM | POA: Diagnosis not present

## 2023-11-02 DIAGNOSIS — K5900 Constipation, unspecified: Secondary | ICD-10-CM | POA: Diagnosis not present

## 2023-11-02 DIAGNOSIS — I509 Heart failure, unspecified: Secondary | ICD-10-CM | POA: Diagnosis not present

## 2023-11-02 DIAGNOSIS — R1310 Dysphagia, unspecified: Secondary | ICD-10-CM | POA: Diagnosis not present

## 2023-11-02 DIAGNOSIS — J302 Other seasonal allergic rhinitis: Secondary | ICD-10-CM | POA: Diagnosis not present

## 2023-11-02 DIAGNOSIS — G4733 Obstructive sleep apnea (adult) (pediatric): Secondary | ICD-10-CM | POA: Diagnosis not present

## 2023-11-02 DIAGNOSIS — Z741 Need for assistance with personal care: Secondary | ICD-10-CM | POA: Diagnosis not present

## 2023-11-02 DIAGNOSIS — Z9181 History of falling: Secondary | ICD-10-CM | POA: Diagnosis not present

## 2023-11-02 DIAGNOSIS — M6281 Muscle weakness (generalized): Secondary | ICD-10-CM | POA: Diagnosis not present

## 2023-11-02 DIAGNOSIS — I69919 Unspecified symptoms and signs involving cognitive functions following unspecified cerebrovascular disease: Secondary | ICD-10-CM | POA: Diagnosis not present

## 2023-11-02 DIAGNOSIS — E119 Type 2 diabetes mellitus without complications: Secondary | ICD-10-CM | POA: Diagnosis not present

## 2023-11-02 DIAGNOSIS — I110 Hypertensive heart disease with heart failure: Secondary | ICD-10-CM | POA: Diagnosis not present

## 2023-11-02 DIAGNOSIS — N189 Chronic kidney disease, unspecified: Secondary | ICD-10-CM | POA: Diagnosis not present

## 2023-11-02 DIAGNOSIS — I5032 Chronic diastolic (congestive) heart failure: Secondary | ICD-10-CM | POA: Diagnosis not present

## 2023-11-02 DIAGNOSIS — K219 Gastro-esophageal reflux disease without esophagitis: Secondary | ICD-10-CM | POA: Diagnosis not present

## 2023-11-02 DIAGNOSIS — R262 Difficulty in walking, not elsewhere classified: Secondary | ICD-10-CM | POA: Diagnosis not present

## 2023-11-02 DIAGNOSIS — Z794 Long term (current) use of insulin: Secondary | ICD-10-CM | POA: Diagnosis not present

## 2023-11-02 DIAGNOSIS — J45909 Unspecified asthma, uncomplicated: Secondary | ICD-10-CM | POA: Diagnosis not present

## 2023-11-02 DIAGNOSIS — R531 Weakness: Secondary | ICD-10-CM | POA: Diagnosis not present

## 2023-11-02 DIAGNOSIS — I69991 Dysphagia following unspecified cerebrovascular disease: Secondary | ICD-10-CM | POA: Diagnosis not present

## 2023-11-03 DIAGNOSIS — M6281 Muscle weakness (generalized): Secondary | ICD-10-CM | POA: Diagnosis not present

## 2023-11-03 DIAGNOSIS — Z9181 History of falling: Secondary | ICD-10-CM | POA: Diagnosis not present

## 2023-11-03 DIAGNOSIS — E119 Type 2 diabetes mellitus without complications: Secondary | ICD-10-CM | POA: Diagnosis not present

## 2023-11-03 DIAGNOSIS — N189 Chronic kidney disease, unspecified: Secondary | ICD-10-CM | POA: Diagnosis not present

## 2023-11-03 DIAGNOSIS — I509 Heart failure, unspecified: Secondary | ICD-10-CM | POA: Diagnosis not present

## 2023-11-03 DIAGNOSIS — I69991 Dysphagia following unspecified cerebrovascular disease: Secondary | ICD-10-CM | POA: Diagnosis not present

## 2023-11-03 DIAGNOSIS — Z741 Need for assistance with personal care: Secondary | ICD-10-CM | POA: Diagnosis not present

## 2023-11-03 DIAGNOSIS — R262 Difficulty in walking, not elsewhere classified: Secondary | ICD-10-CM | POA: Diagnosis not present

## 2023-11-03 DIAGNOSIS — I69919 Unspecified symptoms and signs involving cognitive functions following unspecified cerebrovascular disease: Secondary | ICD-10-CM | POA: Diagnosis not present

## 2023-11-04 DIAGNOSIS — E119 Type 2 diabetes mellitus without complications: Secondary | ICD-10-CM | POA: Diagnosis not present

## 2023-11-04 DIAGNOSIS — R262 Difficulty in walking, not elsewhere classified: Secondary | ICD-10-CM | POA: Diagnosis not present

## 2023-11-04 DIAGNOSIS — I69991 Dysphagia following unspecified cerebrovascular disease: Secondary | ICD-10-CM | POA: Diagnosis not present

## 2023-11-04 DIAGNOSIS — N189 Chronic kidney disease, unspecified: Secondary | ICD-10-CM | POA: Diagnosis not present

## 2023-11-04 DIAGNOSIS — I509 Heart failure, unspecified: Secondary | ICD-10-CM | POA: Diagnosis not present

## 2023-11-04 DIAGNOSIS — M6281 Muscle weakness (generalized): Secondary | ICD-10-CM | POA: Diagnosis not present

## 2023-11-04 DIAGNOSIS — Z9181 History of falling: Secondary | ICD-10-CM | POA: Diagnosis not present

## 2023-11-04 DIAGNOSIS — I69919 Unspecified symptoms and signs involving cognitive functions following unspecified cerebrovascular disease: Secondary | ICD-10-CM | POA: Diagnosis not present

## 2023-11-04 DIAGNOSIS — Z741 Need for assistance with personal care: Secondary | ICD-10-CM | POA: Diagnosis not present

## 2023-11-05 DIAGNOSIS — M6281 Muscle weakness (generalized): Secondary | ICD-10-CM | POA: Diagnosis not present

## 2023-11-05 DIAGNOSIS — N189 Chronic kidney disease, unspecified: Secondary | ICD-10-CM | POA: Diagnosis not present

## 2023-11-05 DIAGNOSIS — Z9181 History of falling: Secondary | ICD-10-CM | POA: Diagnosis not present

## 2023-11-05 DIAGNOSIS — I69919 Unspecified symptoms and signs involving cognitive functions following unspecified cerebrovascular disease: Secondary | ICD-10-CM | POA: Diagnosis not present

## 2023-11-05 DIAGNOSIS — R262 Difficulty in walking, not elsewhere classified: Secondary | ICD-10-CM | POA: Diagnosis not present

## 2023-11-05 DIAGNOSIS — E119 Type 2 diabetes mellitus without complications: Secondary | ICD-10-CM | POA: Diagnosis not present

## 2023-11-05 DIAGNOSIS — I69991 Dysphagia following unspecified cerebrovascular disease: Secondary | ICD-10-CM | POA: Diagnosis not present

## 2023-11-05 DIAGNOSIS — Z741 Need for assistance with personal care: Secondary | ICD-10-CM | POA: Diagnosis not present

## 2023-11-05 DIAGNOSIS — I509 Heart failure, unspecified: Secondary | ICD-10-CM | POA: Diagnosis not present

## 2023-11-07 DIAGNOSIS — I69991 Dysphagia following unspecified cerebrovascular disease: Secondary | ICD-10-CM | POA: Diagnosis not present

## 2023-11-07 DIAGNOSIS — N189 Chronic kidney disease, unspecified: Secondary | ICD-10-CM | POA: Diagnosis not present

## 2023-11-07 DIAGNOSIS — I509 Heart failure, unspecified: Secondary | ICD-10-CM | POA: Diagnosis not present

## 2023-11-07 DIAGNOSIS — Z741 Need for assistance with personal care: Secondary | ICD-10-CM | POA: Diagnosis not present

## 2023-11-07 DIAGNOSIS — I69919 Unspecified symptoms and signs involving cognitive functions following unspecified cerebrovascular disease: Secondary | ICD-10-CM | POA: Diagnosis not present

## 2023-11-07 DIAGNOSIS — Z9181 History of falling: Secondary | ICD-10-CM | POA: Diagnosis not present

## 2023-11-07 DIAGNOSIS — M6281 Muscle weakness (generalized): Secondary | ICD-10-CM | POA: Diagnosis not present

## 2023-11-07 DIAGNOSIS — E119 Type 2 diabetes mellitus without complications: Secondary | ICD-10-CM | POA: Diagnosis not present

## 2023-11-07 DIAGNOSIS — R262 Difficulty in walking, not elsewhere classified: Secondary | ICD-10-CM | POA: Diagnosis not present

## 2023-11-08 DIAGNOSIS — I69919 Unspecified symptoms and signs involving cognitive functions following unspecified cerebrovascular disease: Secondary | ICD-10-CM | POA: Diagnosis not present

## 2023-11-08 DIAGNOSIS — I509 Heart failure, unspecified: Secondary | ICD-10-CM | POA: Diagnosis not present

## 2023-11-08 DIAGNOSIS — M6281 Muscle weakness (generalized): Secondary | ICD-10-CM | POA: Diagnosis not present

## 2023-11-08 DIAGNOSIS — J45909 Unspecified asthma, uncomplicated: Secondary | ICD-10-CM | POA: Diagnosis not present

## 2023-11-08 DIAGNOSIS — R1310 Dysphagia, unspecified: Secondary | ICD-10-CM | POA: Diagnosis not present

## 2023-11-08 DIAGNOSIS — I5032 Chronic diastolic (congestive) heart failure: Secondary | ICD-10-CM | POA: Diagnosis not present

## 2023-11-08 DIAGNOSIS — J302 Other seasonal allergic rhinitis: Secondary | ICD-10-CM | POA: Diagnosis not present

## 2023-11-08 DIAGNOSIS — R262 Difficulty in walking, not elsewhere classified: Secondary | ICD-10-CM | POA: Diagnosis not present

## 2023-11-08 DIAGNOSIS — Z9181 History of falling: Secondary | ICD-10-CM | POA: Diagnosis not present

## 2023-11-08 DIAGNOSIS — N189 Chronic kidney disease, unspecified: Secondary | ICD-10-CM | POA: Diagnosis not present

## 2023-11-08 DIAGNOSIS — R531 Weakness: Secondary | ICD-10-CM | POA: Diagnosis not present

## 2023-11-08 DIAGNOSIS — I110 Hypertensive heart disease with heart failure: Secondary | ICD-10-CM | POA: Diagnosis not present

## 2023-11-08 DIAGNOSIS — G4733 Obstructive sleep apnea (adult) (pediatric): Secondary | ICD-10-CM | POA: Diagnosis not present

## 2023-11-08 DIAGNOSIS — Z794 Long term (current) use of insulin: Secondary | ICD-10-CM | POA: Diagnosis not present

## 2023-11-08 DIAGNOSIS — I69991 Dysphagia following unspecified cerebrovascular disease: Secondary | ICD-10-CM | POA: Diagnosis not present

## 2023-11-08 DIAGNOSIS — K219 Gastro-esophageal reflux disease without esophagitis: Secondary | ICD-10-CM | POA: Diagnosis not present

## 2023-11-08 DIAGNOSIS — E119 Type 2 diabetes mellitus without complications: Secondary | ICD-10-CM | POA: Diagnosis not present

## 2023-11-08 DIAGNOSIS — Z741 Need for assistance with personal care: Secondary | ICD-10-CM | POA: Diagnosis not present

## 2023-11-08 DIAGNOSIS — K5900 Constipation, unspecified: Secondary | ICD-10-CM | POA: Diagnosis not present

## 2023-11-09 DIAGNOSIS — Z9181 History of falling: Secondary | ICD-10-CM | POA: Diagnosis not present

## 2023-11-09 DIAGNOSIS — R262 Difficulty in walking, not elsewhere classified: Secondary | ICD-10-CM | POA: Diagnosis not present

## 2023-11-09 DIAGNOSIS — I509 Heart failure, unspecified: Secondary | ICD-10-CM | POA: Diagnosis not present

## 2023-11-09 DIAGNOSIS — I69991 Dysphagia following unspecified cerebrovascular disease: Secondary | ICD-10-CM | POA: Diagnosis not present

## 2023-11-09 DIAGNOSIS — N189 Chronic kidney disease, unspecified: Secondary | ICD-10-CM | POA: Diagnosis not present

## 2023-11-09 DIAGNOSIS — M6281 Muscle weakness (generalized): Secondary | ICD-10-CM | POA: Diagnosis not present

## 2023-11-09 DIAGNOSIS — E119 Type 2 diabetes mellitus without complications: Secondary | ICD-10-CM | POA: Diagnosis not present

## 2023-11-09 DIAGNOSIS — Z741 Need for assistance with personal care: Secondary | ICD-10-CM | POA: Diagnosis not present

## 2023-11-09 DIAGNOSIS — I69919 Unspecified symptoms and signs involving cognitive functions following unspecified cerebrovascular disease: Secondary | ICD-10-CM | POA: Diagnosis not present

## 2023-11-10 DIAGNOSIS — Z9181 History of falling: Secondary | ICD-10-CM | POA: Diagnosis not present

## 2023-11-10 DIAGNOSIS — N189 Chronic kidney disease, unspecified: Secondary | ICD-10-CM | POA: Diagnosis not present

## 2023-11-10 DIAGNOSIS — I69919 Unspecified symptoms and signs involving cognitive functions following unspecified cerebrovascular disease: Secondary | ICD-10-CM | POA: Diagnosis not present

## 2023-11-10 DIAGNOSIS — R262 Difficulty in walking, not elsewhere classified: Secondary | ICD-10-CM | POA: Diagnosis not present

## 2023-11-10 DIAGNOSIS — I69991 Dysphagia following unspecified cerebrovascular disease: Secondary | ICD-10-CM | POA: Diagnosis not present

## 2023-11-10 DIAGNOSIS — E119 Type 2 diabetes mellitus without complications: Secondary | ICD-10-CM | POA: Diagnosis not present

## 2023-11-10 DIAGNOSIS — Z741 Need for assistance with personal care: Secondary | ICD-10-CM | POA: Diagnosis not present

## 2023-11-10 DIAGNOSIS — I509 Heart failure, unspecified: Secondary | ICD-10-CM | POA: Diagnosis not present

## 2023-11-10 DIAGNOSIS — M6281 Muscle weakness (generalized): Secondary | ICD-10-CM | POA: Diagnosis not present

## 2023-11-11 DIAGNOSIS — I69919 Unspecified symptoms and signs involving cognitive functions following unspecified cerebrovascular disease: Secondary | ICD-10-CM | POA: Diagnosis not present

## 2023-11-11 DIAGNOSIS — R262 Difficulty in walking, not elsewhere classified: Secondary | ICD-10-CM | POA: Diagnosis not present

## 2023-11-11 DIAGNOSIS — Z741 Need for assistance with personal care: Secondary | ICD-10-CM | POA: Diagnosis not present

## 2023-11-11 DIAGNOSIS — R413 Other amnesia: Secondary | ICD-10-CM | POA: Diagnosis not present

## 2023-11-11 DIAGNOSIS — Z794 Long term (current) use of insulin: Secondary | ICD-10-CM | POA: Diagnosis not present

## 2023-11-11 DIAGNOSIS — N189 Chronic kidney disease, unspecified: Secondary | ICD-10-CM | POA: Diagnosis not present

## 2023-11-11 DIAGNOSIS — I69991 Dysphagia following unspecified cerebrovascular disease: Secondary | ICD-10-CM | POA: Diagnosis not present

## 2023-11-11 DIAGNOSIS — I5032 Chronic diastolic (congestive) heart failure: Secondary | ICD-10-CM | POA: Diagnosis not present

## 2023-11-11 DIAGNOSIS — I509 Heart failure, unspecified: Secondary | ICD-10-CM | POA: Diagnosis not present

## 2023-11-11 DIAGNOSIS — I2510 Atherosclerotic heart disease of native coronary artery without angina pectoris: Secondary | ICD-10-CM | POA: Diagnosis not present

## 2023-11-11 DIAGNOSIS — Z9181 History of falling: Secondary | ICD-10-CM | POA: Diagnosis not present

## 2023-11-11 DIAGNOSIS — E119 Type 2 diabetes mellitus without complications: Secondary | ICD-10-CM | POA: Diagnosis not present

## 2023-11-11 DIAGNOSIS — M6281 Muscle weakness (generalized): Secondary | ICD-10-CM | POA: Diagnosis not present

## 2023-11-11 DIAGNOSIS — J45909 Unspecified asthma, uncomplicated: Secondary | ICD-10-CM | POA: Diagnosis not present

## 2023-11-11 DIAGNOSIS — I110 Hypertensive heart disease with heart failure: Secondary | ICD-10-CM | POA: Diagnosis not present

## 2023-11-12 DIAGNOSIS — R262 Difficulty in walking, not elsewhere classified: Secondary | ICD-10-CM | POA: Diagnosis not present

## 2023-11-12 DIAGNOSIS — E119 Type 2 diabetes mellitus without complications: Secondary | ICD-10-CM | POA: Diagnosis not present

## 2023-11-12 DIAGNOSIS — Z9181 History of falling: Secondary | ICD-10-CM | POA: Diagnosis not present

## 2023-11-12 DIAGNOSIS — M6281 Muscle weakness (generalized): Secondary | ICD-10-CM | POA: Diagnosis not present

## 2023-11-12 DIAGNOSIS — Z741 Need for assistance with personal care: Secondary | ICD-10-CM | POA: Diagnosis not present

## 2023-11-12 DIAGNOSIS — I69991 Dysphagia following unspecified cerebrovascular disease: Secondary | ICD-10-CM | POA: Diagnosis not present

## 2023-11-12 DIAGNOSIS — N189 Chronic kidney disease, unspecified: Secondary | ICD-10-CM | POA: Diagnosis not present

## 2023-11-12 DIAGNOSIS — I509 Heart failure, unspecified: Secondary | ICD-10-CM | POA: Diagnosis not present

## 2023-11-12 DIAGNOSIS — I69919 Unspecified symptoms and signs involving cognitive functions following unspecified cerebrovascular disease: Secondary | ICD-10-CM | POA: Diagnosis not present

## 2023-11-13 DIAGNOSIS — N189 Chronic kidney disease, unspecified: Secondary | ICD-10-CM | POA: Diagnosis not present

## 2023-11-13 DIAGNOSIS — R262 Difficulty in walking, not elsewhere classified: Secondary | ICD-10-CM | POA: Diagnosis not present

## 2023-11-13 DIAGNOSIS — I69919 Unspecified symptoms and signs involving cognitive functions following unspecified cerebrovascular disease: Secondary | ICD-10-CM | POA: Diagnosis not present

## 2023-11-13 DIAGNOSIS — I69991 Dysphagia following unspecified cerebrovascular disease: Secondary | ICD-10-CM | POA: Diagnosis not present

## 2023-11-13 DIAGNOSIS — E119 Type 2 diabetes mellitus without complications: Secondary | ICD-10-CM | POA: Diagnosis not present

## 2023-11-13 DIAGNOSIS — Z9181 History of falling: Secondary | ICD-10-CM | POA: Diagnosis not present

## 2023-11-13 DIAGNOSIS — M6281 Muscle weakness (generalized): Secondary | ICD-10-CM | POA: Diagnosis not present

## 2023-11-13 DIAGNOSIS — Z741 Need for assistance with personal care: Secondary | ICD-10-CM | POA: Diagnosis not present

## 2023-11-13 DIAGNOSIS — I509 Heart failure, unspecified: Secondary | ICD-10-CM | POA: Diagnosis not present

## 2023-11-14 DIAGNOSIS — R531 Weakness: Secondary | ICD-10-CM | POA: Diagnosis not present

## 2023-11-14 DIAGNOSIS — E119 Type 2 diabetes mellitus without complications: Secondary | ICD-10-CM | POA: Diagnosis not present

## 2023-11-14 DIAGNOSIS — N189 Chronic kidney disease, unspecified: Secondary | ICD-10-CM | POA: Diagnosis not present

## 2023-11-14 DIAGNOSIS — J45909 Unspecified asthma, uncomplicated: Secondary | ICD-10-CM | POA: Diagnosis not present

## 2023-11-14 DIAGNOSIS — N179 Acute kidney failure, unspecified: Secondary | ICD-10-CM | POA: Diagnosis not present

## 2023-11-14 DIAGNOSIS — R262 Difficulty in walking, not elsewhere classified: Secondary | ICD-10-CM | POA: Diagnosis not present

## 2023-11-14 DIAGNOSIS — I69991 Dysphagia following unspecified cerebrovascular disease: Secondary | ICD-10-CM | POA: Diagnosis not present

## 2023-11-14 DIAGNOSIS — R1310 Dysphagia, unspecified: Secondary | ICD-10-CM | POA: Diagnosis not present

## 2023-11-14 DIAGNOSIS — G4733 Obstructive sleep apnea (adult) (pediatric): Secondary | ICD-10-CM | POA: Diagnosis not present

## 2023-11-14 DIAGNOSIS — Z9181 History of falling: Secondary | ICD-10-CM | POA: Diagnosis not present

## 2023-11-14 DIAGNOSIS — K219 Gastro-esophageal reflux disease without esophagitis: Secondary | ICD-10-CM | POA: Diagnosis not present

## 2023-11-14 DIAGNOSIS — Z741 Need for assistance with personal care: Secondary | ICD-10-CM | POA: Diagnosis not present

## 2023-11-14 DIAGNOSIS — M6281 Muscle weakness (generalized): Secondary | ICD-10-CM | POA: Diagnosis not present

## 2023-11-14 DIAGNOSIS — I959 Hypotension, unspecified: Secondary | ICD-10-CM | POA: Diagnosis not present

## 2023-11-14 DIAGNOSIS — H903 Sensorineural hearing loss, bilateral: Secondary | ICD-10-CM | POA: Diagnosis not present

## 2023-11-14 DIAGNOSIS — R339 Retention of urine, unspecified: Secondary | ICD-10-CM | POA: Diagnosis not present

## 2023-11-14 DIAGNOSIS — I69919 Unspecified symptoms and signs involving cognitive functions following unspecified cerebrovascular disease: Secondary | ICD-10-CM | POA: Diagnosis not present

## 2023-11-14 DIAGNOSIS — I509 Heart failure, unspecified: Secondary | ICD-10-CM | POA: Diagnosis not present

## 2023-11-14 DIAGNOSIS — I10 Essential (primary) hypertension: Secondary | ICD-10-CM | POA: Diagnosis not present

## 2023-11-15 DIAGNOSIS — Z741 Need for assistance with personal care: Secondary | ICD-10-CM | POA: Diagnosis not present

## 2023-11-15 DIAGNOSIS — E119 Type 2 diabetes mellitus without complications: Secondary | ICD-10-CM | POA: Diagnosis not present

## 2023-11-15 DIAGNOSIS — I69991 Dysphagia following unspecified cerebrovascular disease: Secondary | ICD-10-CM | POA: Diagnosis not present

## 2023-11-15 DIAGNOSIS — R262 Difficulty in walking, not elsewhere classified: Secondary | ICD-10-CM | POA: Diagnosis not present

## 2023-11-15 DIAGNOSIS — M6281 Muscle weakness (generalized): Secondary | ICD-10-CM | POA: Diagnosis not present

## 2023-11-15 DIAGNOSIS — N189 Chronic kidney disease, unspecified: Secondary | ICD-10-CM | POA: Diagnosis not present

## 2023-11-15 DIAGNOSIS — I509 Heart failure, unspecified: Secondary | ICD-10-CM | POA: Diagnosis not present

## 2023-11-15 DIAGNOSIS — Z9181 History of falling: Secondary | ICD-10-CM | POA: Diagnosis not present

## 2023-11-15 DIAGNOSIS — I69919 Unspecified symptoms and signs involving cognitive functions following unspecified cerebrovascular disease: Secondary | ICD-10-CM | POA: Diagnosis not present

## 2023-11-16 DIAGNOSIS — E119 Type 2 diabetes mellitus without complications: Secondary | ICD-10-CM | POA: Diagnosis not present

## 2023-11-16 DIAGNOSIS — I69919 Unspecified symptoms and signs involving cognitive functions following unspecified cerebrovascular disease: Secondary | ICD-10-CM | POA: Diagnosis not present

## 2023-11-16 DIAGNOSIS — Z741 Need for assistance with personal care: Secondary | ICD-10-CM | POA: Diagnosis not present

## 2023-11-16 DIAGNOSIS — I509 Heart failure, unspecified: Secondary | ICD-10-CM | POA: Diagnosis not present

## 2023-11-16 DIAGNOSIS — I69991 Dysphagia following unspecified cerebrovascular disease: Secondary | ICD-10-CM | POA: Diagnosis not present

## 2023-11-16 DIAGNOSIS — M6281 Muscle weakness (generalized): Secondary | ICD-10-CM | POA: Diagnosis not present

## 2023-11-16 DIAGNOSIS — N189 Chronic kidney disease, unspecified: Secondary | ICD-10-CM | POA: Diagnosis not present

## 2023-11-16 DIAGNOSIS — R262 Difficulty in walking, not elsewhere classified: Secondary | ICD-10-CM | POA: Diagnosis not present

## 2023-11-16 DIAGNOSIS — Z9181 History of falling: Secondary | ICD-10-CM | POA: Diagnosis not present

## 2023-11-17 DIAGNOSIS — I69991 Dysphagia following unspecified cerebrovascular disease: Secondary | ICD-10-CM | POA: Diagnosis not present

## 2023-11-17 DIAGNOSIS — E119 Type 2 diabetes mellitus without complications: Secondary | ICD-10-CM | POA: Diagnosis not present

## 2023-11-17 DIAGNOSIS — Z741 Need for assistance with personal care: Secondary | ICD-10-CM | POA: Diagnosis not present

## 2023-11-17 DIAGNOSIS — I509 Heart failure, unspecified: Secondary | ICD-10-CM | POA: Diagnosis not present

## 2023-11-17 DIAGNOSIS — N189 Chronic kidney disease, unspecified: Secondary | ICD-10-CM | POA: Diagnosis not present

## 2023-11-17 DIAGNOSIS — I69919 Unspecified symptoms and signs involving cognitive functions following unspecified cerebrovascular disease: Secondary | ICD-10-CM | POA: Diagnosis not present

## 2023-11-17 DIAGNOSIS — R262 Difficulty in walking, not elsewhere classified: Secondary | ICD-10-CM | POA: Diagnosis not present

## 2023-11-17 DIAGNOSIS — M6281 Muscle weakness (generalized): Secondary | ICD-10-CM | POA: Diagnosis not present

## 2023-11-17 DIAGNOSIS — Z9181 History of falling: Secondary | ICD-10-CM | POA: Diagnosis not present

## 2023-11-17 DIAGNOSIS — I10 Essential (primary) hypertension: Secondary | ICD-10-CM | POA: Diagnosis not present

## 2023-11-18 DIAGNOSIS — I69991 Dysphagia following unspecified cerebrovascular disease: Secondary | ICD-10-CM | POA: Diagnosis not present

## 2023-11-18 DIAGNOSIS — M6281 Muscle weakness (generalized): Secondary | ICD-10-CM | POA: Diagnosis not present

## 2023-11-18 DIAGNOSIS — I509 Heart failure, unspecified: Secondary | ICD-10-CM | POA: Diagnosis not present

## 2023-11-18 DIAGNOSIS — N189 Chronic kidney disease, unspecified: Secondary | ICD-10-CM | POA: Diagnosis not present

## 2023-11-18 DIAGNOSIS — I69919 Unspecified symptoms and signs involving cognitive functions following unspecified cerebrovascular disease: Secondary | ICD-10-CM | POA: Diagnosis not present

## 2023-11-18 DIAGNOSIS — Z741 Need for assistance with personal care: Secondary | ICD-10-CM | POA: Diagnosis not present

## 2023-11-18 DIAGNOSIS — Z9181 History of falling: Secondary | ICD-10-CM | POA: Diagnosis not present

## 2023-11-18 DIAGNOSIS — R262 Difficulty in walking, not elsewhere classified: Secondary | ICD-10-CM | POA: Diagnosis not present

## 2023-11-18 DIAGNOSIS — E119 Type 2 diabetes mellitus without complications: Secondary | ICD-10-CM | POA: Diagnosis not present

## 2023-11-19 DIAGNOSIS — R262 Difficulty in walking, not elsewhere classified: Secondary | ICD-10-CM | POA: Diagnosis not present

## 2023-11-19 DIAGNOSIS — Z741 Need for assistance with personal care: Secondary | ICD-10-CM | POA: Diagnosis not present

## 2023-11-19 DIAGNOSIS — I69919 Unspecified symptoms and signs involving cognitive functions following unspecified cerebrovascular disease: Secondary | ICD-10-CM | POA: Diagnosis not present

## 2023-11-19 DIAGNOSIS — N189 Chronic kidney disease, unspecified: Secondary | ICD-10-CM | POA: Diagnosis not present

## 2023-11-19 DIAGNOSIS — E119 Type 2 diabetes mellitus without complications: Secondary | ICD-10-CM | POA: Diagnosis not present

## 2023-11-19 DIAGNOSIS — I509 Heart failure, unspecified: Secondary | ICD-10-CM | POA: Diagnosis not present

## 2023-11-19 DIAGNOSIS — M6281 Muscle weakness (generalized): Secondary | ICD-10-CM | POA: Diagnosis not present

## 2023-11-19 DIAGNOSIS — I69991 Dysphagia following unspecified cerebrovascular disease: Secondary | ICD-10-CM | POA: Diagnosis not present

## 2023-11-19 DIAGNOSIS — Z9181 History of falling: Secondary | ICD-10-CM | POA: Diagnosis not present

## 2023-11-20 DIAGNOSIS — N189 Chronic kidney disease, unspecified: Secondary | ICD-10-CM | POA: Diagnosis not present

## 2023-11-20 DIAGNOSIS — I509 Heart failure, unspecified: Secondary | ICD-10-CM | POA: Diagnosis not present

## 2023-11-20 DIAGNOSIS — Z9181 History of falling: Secondary | ICD-10-CM | POA: Diagnosis not present

## 2023-11-20 DIAGNOSIS — I69991 Dysphagia following unspecified cerebrovascular disease: Secondary | ICD-10-CM | POA: Diagnosis not present

## 2023-11-20 DIAGNOSIS — Z741 Need for assistance with personal care: Secondary | ICD-10-CM | POA: Diagnosis not present

## 2023-11-20 DIAGNOSIS — M6281 Muscle weakness (generalized): Secondary | ICD-10-CM | POA: Diagnosis not present

## 2023-11-20 DIAGNOSIS — I69919 Unspecified symptoms and signs involving cognitive functions following unspecified cerebrovascular disease: Secondary | ICD-10-CM | POA: Diagnosis not present

## 2023-11-20 DIAGNOSIS — E119 Type 2 diabetes mellitus without complications: Secondary | ICD-10-CM | POA: Diagnosis not present

## 2023-11-20 DIAGNOSIS — R262 Difficulty in walking, not elsewhere classified: Secondary | ICD-10-CM | POA: Diagnosis not present

## 2023-11-22 DIAGNOSIS — M6281 Muscle weakness (generalized): Secondary | ICD-10-CM | POA: Diagnosis not present

## 2023-11-22 DIAGNOSIS — E119 Type 2 diabetes mellitus without complications: Secondary | ICD-10-CM | POA: Diagnosis not present

## 2023-11-22 DIAGNOSIS — I509 Heart failure, unspecified: Secondary | ICD-10-CM | POA: Diagnosis not present

## 2023-11-22 DIAGNOSIS — N189 Chronic kidney disease, unspecified: Secondary | ICD-10-CM | POA: Diagnosis not present

## 2023-11-22 DIAGNOSIS — R262 Difficulty in walking, not elsewhere classified: Secondary | ICD-10-CM | POA: Diagnosis not present

## 2023-11-22 DIAGNOSIS — Z9181 History of falling: Secondary | ICD-10-CM | POA: Diagnosis not present

## 2023-11-23 DIAGNOSIS — Z794 Long term (current) use of insulin: Secondary | ICD-10-CM | POA: Diagnosis not present

## 2023-11-23 DIAGNOSIS — I5032 Chronic diastolic (congestive) heart failure: Secondary | ICD-10-CM | POA: Diagnosis not present

## 2023-11-23 DIAGNOSIS — I110 Hypertensive heart disease with heart failure: Secondary | ICD-10-CM | POA: Diagnosis not present

## 2023-11-23 DIAGNOSIS — J302 Other seasonal allergic rhinitis: Secondary | ICD-10-CM | POA: Diagnosis not present

## 2023-11-23 DIAGNOSIS — K5900 Constipation, unspecified: Secondary | ICD-10-CM | POA: Diagnosis not present

## 2023-11-23 DIAGNOSIS — K219 Gastro-esophageal reflux disease without esophagitis: Secondary | ICD-10-CM | POA: Diagnosis not present

## 2023-11-23 DIAGNOSIS — E119 Type 2 diabetes mellitus without complications: Secondary | ICD-10-CM | POA: Diagnosis not present

## 2023-11-23 DIAGNOSIS — G4733 Obstructive sleep apnea (adult) (pediatric): Secondary | ICD-10-CM | POA: Diagnosis not present

## 2023-11-23 DIAGNOSIS — R1310 Dysphagia, unspecified: Secondary | ICD-10-CM | POA: Diagnosis not present

## 2023-11-23 DIAGNOSIS — R531 Weakness: Secondary | ICD-10-CM | POA: Diagnosis not present

## 2023-11-23 DIAGNOSIS — J45909 Unspecified asthma, uncomplicated: Secondary | ICD-10-CM | POA: Diagnosis not present

## 2023-11-24 DIAGNOSIS — R262 Difficulty in walking, not elsewhere classified: Secondary | ICD-10-CM | POA: Diagnosis not present

## 2023-11-24 DIAGNOSIS — Z9181 History of falling: Secondary | ICD-10-CM | POA: Diagnosis not present

## 2023-11-24 DIAGNOSIS — N189 Chronic kidney disease, unspecified: Secondary | ICD-10-CM | POA: Diagnosis not present

## 2023-11-24 DIAGNOSIS — E119 Type 2 diabetes mellitus without complications: Secondary | ICD-10-CM | POA: Diagnosis not present

## 2023-11-24 DIAGNOSIS — M6281 Muscle weakness (generalized): Secondary | ICD-10-CM | POA: Diagnosis not present

## 2023-11-24 DIAGNOSIS — I509 Heart failure, unspecified: Secondary | ICD-10-CM | POA: Diagnosis not present

## 2023-11-25 DIAGNOSIS — N390 Urinary tract infection, site not specified: Secondary | ICD-10-CM | POA: Diagnosis not present

## 2023-11-27 DIAGNOSIS — M25511 Pain in right shoulder: Secondary | ICD-10-CM | POA: Diagnosis not present

## 2023-11-27 DIAGNOSIS — I10 Essential (primary) hypertension: Secondary | ICD-10-CM | POA: Diagnosis not present

## 2023-11-27 DIAGNOSIS — M25512 Pain in left shoulder: Secondary | ICD-10-CM | POA: Diagnosis not present

## 2023-11-27 DIAGNOSIS — J45909 Unspecified asthma, uncomplicated: Secondary | ICD-10-CM | POA: Diagnosis not present

## 2023-11-27 DIAGNOSIS — E119 Type 2 diabetes mellitus without complications: Secondary | ICD-10-CM | POA: Diagnosis not present

## 2023-11-27 DIAGNOSIS — N390 Urinary tract infection, site not specified: Secondary | ICD-10-CM | POA: Diagnosis not present

## 2023-11-27 DIAGNOSIS — I5032 Chronic diastolic (congestive) heart failure: Secondary | ICD-10-CM | POA: Diagnosis not present

## 2023-11-29 ENCOUNTER — Emergency Department (HOSPITAL_COMMUNITY)
Admission: EM | Admit: 2023-11-29 | Discharge: 2023-11-29 | Disposition: A | Discharge status: Home / Self Care | Attending: Emergency Medicine | Admitting: Emergency Medicine

## 2023-11-29 ENCOUNTER — Other Ambulatory Visit: Payer: Self-pay

## 2023-11-29 ENCOUNTER — Emergency Department (HOSPITAL_COMMUNITY)

## 2023-11-29 DIAGNOSIS — J811 Chronic pulmonary edema: Secondary | ICD-10-CM | POA: Insufficient documentation

## 2023-11-29 DIAGNOSIS — R4182 Altered mental status, unspecified: Secondary | ICD-10-CM | POA: Insufficient documentation

## 2023-11-29 DIAGNOSIS — N3000 Acute cystitis without hematuria: Secondary | ICD-10-CM | POA: Insufficient documentation

## 2023-11-29 DIAGNOSIS — Z794 Long term (current) use of insulin: Secondary | ICD-10-CM | POA: Diagnosis not present

## 2023-11-29 DIAGNOSIS — Z79899 Other long term (current) drug therapy: Secondary | ICD-10-CM | POA: Insufficient documentation

## 2023-11-29 DIAGNOSIS — Z7901 Long term (current) use of anticoagulants: Secondary | ICD-10-CM | POA: Diagnosis not present

## 2023-11-29 DIAGNOSIS — Z0279 Encounter for issue of other medical certificate: Secondary | ICD-10-CM | POA: Diagnosis not present

## 2023-11-29 DIAGNOSIS — I491 Atrial premature depolarization: Secondary | ICD-10-CM | POA: Insufficient documentation

## 2023-11-29 DIAGNOSIS — T68XXXA Hypothermia, initial encounter: Secondary | ICD-10-CM | POA: Diagnosis not present

## 2023-11-29 DIAGNOSIS — Z743 Need for continuous supervision: Secondary | ICD-10-CM | POA: Diagnosis not present

## 2023-11-29 DIAGNOSIS — R531 Weakness: Secondary | ICD-10-CM | POA: Diagnosis not present

## 2023-11-29 DIAGNOSIS — F0390 Unspecified dementia without behavioral disturbance: Secondary | ICD-10-CM | POA: Insufficient documentation

## 2023-11-29 DIAGNOSIS — J45909 Unspecified asthma, uncomplicated: Secondary | ICD-10-CM | POA: Insufficient documentation

## 2023-11-29 DIAGNOSIS — Z7951 Long term (current) use of inhaled steroids: Secondary | ICD-10-CM | POA: Diagnosis not present

## 2023-11-29 DIAGNOSIS — I10 Essential (primary) hypertension: Secondary | ICD-10-CM | POA: Diagnosis not present

## 2023-11-29 DIAGNOSIS — E119 Type 2 diabetes mellitus without complications: Secondary | ICD-10-CM | POA: Insufficient documentation

## 2023-11-29 DIAGNOSIS — R404 Transient alteration of awareness: Secondary | ICD-10-CM | POA: Diagnosis not present

## 2023-11-29 DIAGNOSIS — Z7401 Bed confinement status: Secondary | ICD-10-CM | POA: Diagnosis not present

## 2023-11-29 DIAGNOSIS — A419 Sepsis, unspecified organism: Secondary | ICD-10-CM | POA: Diagnosis not present

## 2023-11-29 DIAGNOSIS — J90 Pleural effusion, not elsewhere classified: Secondary | ICD-10-CM | POA: Diagnosis not present

## 2023-11-29 LAB — CBC WITH DIFFERENTIAL/PLATELET
Abs Immature Granulocytes: 0.24 K/uL — ABNORMAL HIGH (ref 0.00–0.07)
Basophils Absolute: 0 K/uL (ref 0.0–0.1)
Basophils Relative: 1 %
Eosinophils Absolute: 0.1 K/uL (ref 0.0–0.5)
Eosinophils Relative: 1 %
HCT: 36.9 % (ref 36.0–46.0)
Hemoglobin: 10 g/dL — ABNORMAL LOW (ref 12.0–15.0)
Immature Granulocytes: 5 %
Lymphocytes Relative: 19 %
Lymphs Abs: 0.9 K/uL (ref 0.7–4.0)
MCH: 29.8 pg (ref 26.0–34.0)
MCHC: 27.1 g/dL — ABNORMAL LOW (ref 30.0–36.0)
MCV: 109.8 fL — ABNORMAL HIGH (ref 80.0–100.0)
Monocytes Absolute: 0.4 K/uL (ref 0.1–1.0)
Monocytes Relative: 8 %
Neutro Abs: 3.2 K/uL (ref 1.7–7.7)
Neutrophils Relative %: 66 %
Platelets: 156 K/uL (ref 150–400)
RBC: 3.36 MIL/uL — ABNORMAL LOW (ref 3.87–5.11)
RDW: 14.6 % (ref 11.5–15.5)
WBC: 4.8 K/uL (ref 4.0–10.5)
nRBC: 0 % (ref 0.0–0.2)

## 2023-11-29 LAB — URINALYSIS, W/ REFLEX TO CULTURE (INFECTION SUSPECTED)
Bilirubin Urine: NEGATIVE
Glucose, UA: >=500 mg/dL — AB
Hgb urine dipstick: NEGATIVE
Ketones, ur: NEGATIVE mg/dL
Nitrite: NEGATIVE
Protein, ur: 100 mg/dL — AB
Specific Gravity, Urine: 1.013 (ref 1.005–1.030)
WBC, UA: >50 WBC/hpf (ref 0–5)
pH: 5 (ref 5.0–8.0)

## 2023-11-29 LAB — COMPREHENSIVE METABOLIC PANEL WITH GFR
ALT: 10 U/L (ref 0–44)
AST: 12 U/L — ABNORMAL LOW (ref 15–41)
Albumin: 3.2 g/dL — ABNORMAL LOW (ref 3.5–5.0)
Alkaline Phosphatase: 47 U/L (ref 38–126)
Anion gap: 6 (ref 5–15)
BUN: 28 mg/dL — ABNORMAL HIGH (ref 8–23)
CO2: 38 mmol/L — ABNORMAL HIGH (ref 22–32)
Calcium: 9.3 mg/dL (ref 8.9–10.3)
Chloride: 95 mmol/L — ABNORMAL LOW (ref 98–111)
Creatinine, Ser: 0.71 mg/dL (ref 0.44–1.00)
GFR, Estimated: >60 mL/min (ref >60)
Glucose, Bld: 108 mg/dL — ABNORMAL HIGH (ref 70–99)
Potassium: 4.7 mmol/L (ref 3.5–5.1)
Sodium: 139 mmol/L (ref 135–145)
Total Bilirubin: 0.5 mg/dL (ref 0.0–1.2)
Total Protein: 6.8 g/dL (ref 6.5–8.1)

## 2023-11-29 LAB — I-STAT CHEM 8, ED
BUN: 29 mg/dL — ABNORMAL HIGH (ref 8–23)
Calcium, Ion: 1.29 mmol/L (ref 1.15–1.40)
Chloride: 96 mmol/L — ABNORMAL LOW (ref 98–111)
Creatinine, Ser: 0.9 mg/dL (ref 0.44–1.00)
Glucose, Bld: 104 mg/dL — ABNORMAL HIGH (ref 70–99)
HCT: 36 % (ref 36.0–46.0)
Hemoglobin: 12.2 g/dL (ref 12.0–15.0)
Potassium: 4.8 mmol/L (ref 3.5–5.1)
Sodium: 141 mmol/L (ref 135–145)
TCO2: 39 mmol/L — ABNORMAL HIGH (ref 22–32)

## 2023-11-29 LAB — RESP PANEL BY RT-PCR (RSV, FLU A&B, COVID)  RVPGX2
Influenza A by PCR: NEGATIVE
Influenza B by PCR: NEGATIVE
Resp Syncytial Virus by PCR: NEGATIVE
SARS Coronavirus 2 by RT PCR: NEGATIVE

## 2023-11-29 LAB — I-STAT CG4 LACTIC ACID, ED
Lactic Acid, Venous: 0.5 mmol/L (ref 0.5–1.9)
Lactic Acid, Venous: 0.4 mmol/L — ABNORMAL LOW (ref 0.5–1.9)

## 2023-11-29 LAB — TROPONIN I (HIGH SENSITIVITY)
Troponin I (High Sensitivity): 21 ng/L — ABNORMAL HIGH (ref <18)
Troponin I (High Sensitivity): 20 ng/L — ABNORMAL HIGH (ref <18)

## 2023-11-29 LAB — PROTIME-INR
INR: 1 (ref 0.8–1.2)
Prothrombin Time: 13.8 s (ref 11.4–15.2)

## 2023-11-29 MED ORDER — SODIUM CHLORIDE 0.9 % IV SOLN
1.0000 g | Freq: Once | INTRAVENOUS | Status: AC
Start: 2023-11-29 — End: 2023-11-29
  Administered 2023-11-29: 1 g via INTRAVENOUS
  Filled 2023-11-29: qty 10

## 2023-11-29 MED ORDER — CEPHALEXIN 500 MG PO CAPS: 500.0000 mg | ORAL_CAPSULE | Freq: Three times a day (TID) | ORAL | 0 refills | Status: DC

## 2023-11-29 MED ORDER — SODIUM CHLORIDE 0.9 % IV BOLUS
1000.0000 mL | Freq: Once | INTRAVENOUS | Status: AC
  Administered 2023-11-29: 1000 mL via INTRAVENOUS

## 2023-11-29 NOTE — ED Triage Notes (Signed)
 Pt arrives via EMS from [facility]. Staff reports poor PO intake today, and a rectal temp of 63F. Pt has a recent dx. of UTI and is on day 3 of treatment.   Hx Dementia.   Pt wears 3L nasal cannula at baseline.

## 2023-11-29 NOTE — Discharge Instructions (Addendum)
 Your urine showed urinary tract infection.  Otherwise your blood work was reassuring.  No evidence of pneumonia on chest x-ray.  You received a dose of IV antibiotic.  Antibiotic changed to Keflex .  Urine culture sent which we should have results for in 2 days to ensure that this antibiotic is sufficient to cover the urinary tract infection.  Return for any concerning symptoms.  You remained at your baseline mental status without any confusion.

## 2023-11-29 NOTE — ED Notes (Signed)
 PTAR called and transport arranged.

## 2023-11-29 NOTE — ED Notes (Addendum)
 Pt Elizabeth [name] not fitting well and dislodges, causing pt Elizabeth [name] to drop into 80s. Pt denied any SOB or new complaints. Pt Elizabeth [name] switched and placed back in nostrils, and Elizabeth [name] improved.

## 2023-11-29 NOTE — ED Notes (Signed)
 Pt daughter (POA) going home at this time. Requesting to be called by staff when pt dispo determined. Daughter phone # verified in charge prior to her leaving.

## 2023-11-29 NOTE — ED Provider Notes (Addendum)
 [place] EMERGENCY DEPARTMENT AT [facility] Provider Note   CSN: [id number] Arrival date & time: 11/29/23  1749     Patient presents with: Medical Clearance   Elizabeth [name] is a 83 y.o. female.   83 year old female presents from nursing home for concern of altered mental status.  According to the nurse at the facility who I spoke with she was refusing medicines and meals which is unusual for her.  She does have a history of dementia but at baseline she is alert and oriented x 4.  On my evaluation she is alert and oriented x 4.  Denies any complaints currently.  Recently diagnosed with UTI.  Currently on cefdinir which was started on 8/7.  The history is provided by the patient. No language interpreter was used.       Prior to Admission medications   Medication Sig Start Date End Date Taking? Authorizing Provider  acetaminophen  (TYLENOL ) 500 MG tablet Take 1,000 mg by mouth in the morning and at bedtime.    [provider]  albuterol  (PROVENTIL  HFA;VENTOLIN  HFA) 108 (90 BASE) MCG/ACT inhaler Inhale 2 puffs into the lungs every 6 (six) hours as needed for shortness of breath.    [provider]  albuterol  (PROVENTIL ) (2.5 MG/3ML) 0.083% nebulizer solution Take 2.5 mg by nebulization every 6 (six) hours as needed for wheezing or shortness of breath.    [provider]  apixaban  (ELIQUIS ) 5 MG TABS tablet Take 1 tablet (5 mg total) by mouth 2 (two) times daily. 04/28/23   Amin, Ankit C, MD  bisacodyl  (DULCOLAX) 5 MG EC tablet Take 2 tablets (10 mg total) by mouth daily as needed for moderate constipation or severe constipation. 04/28/23   Amin, Burgess BROCKS, MD  budesonide -formoterol  (SYMBICORT) 160-4.5 MCG/ACT inhaler Inhale 2 puffs into the lungs 2 (two) times daily.    [provider]  carvedilol  (COREG ) 3.125 MG tablet Take 1 tablet (3.125 mg total) by mouth 2 (two) times daily. 09/18/23 09/17/24  Shalhoub, Zachary PARAS, MD  cetirizine  (ZYRTEC ) 10 MG  tablet Take 10 mg by mouth daily.    [provider]  cyanocobalamin  (VITAMIN B12) 1000 MCG tablet Take 1,000 mcg by mouth daily.    [provider]  diclofenac  Sodium (VOLTAREN ) 1 % GEL Apply 1 Application topically 4 (four) times daily. 04/24/21   [provider]  donepezil  (ARICEPT ) 5 MG tablet Take 1 tablet (5 mg total) by mouth daily. 07/30/23   Wertman, Sara E, PA-C  fluticasone  (FLONASE ) 50 MCG/ACT nasal spray PLACE 1 SPRAY INTO BOTH NOSTRILS 2 (TWO) TIMES DAILY 09/13/22   Shellia Oh, MD  furosemide  (LASIX ) 40 MG tablet Take 1 tablet (40 mg total) by mouth daily as needed. Weigh yourself daily.  Take one dose by mouth if your weight increases by more than 2 pounds in one day or 5 pounds in one week. 09/18/23 09/17/24  Shalhoub, Zachary PARAS, MD  gabapentin  (NEURONTIN ) 100 MG capsule Take 100 mg by mouth daily. Take 200 mg by mouth three times daily. Take 300 mg by mouth at bedtime. 03/27/23   [provider]  hydrALAZINE  (APRESOLINE ) 50 MG tablet Take 25 mg by mouth every 8 (eight) hours as needed (hypertension). 04/10/23   [provider]  insulin  aspart (NOVOLOG ) 100 UNIT/ML injection Inject 0-15 Units into the skin 4 (four) times daily -  before meals and at bedtime. 09/18/23   Shalhoub, Zachary PARAS, MD  JARDIANCE  10 MG TABS tablet Take 10  mg by mouth daily. 04/09/23   [provider]  lidocaine  (LIDODERM ) 5 % Place 1 patch onto the skin at bedtime. 03/13/23   [provider]  losartan  (COZAAR ) 100 MG tablet Take 100 mg by mouth daily.    [provider]  Menthol, Topical Analgesic, (BIOFREEZE COOL THE PAIN EX) Apply 1 application  topically in the morning and at bedtime. Apply to BL hands and ankles    [provider]  Multiple Vitamins-Minerals (CENTRUM SILVER PO) Take 1 tablet by mouth daily.    [provider]  oxybutynin  (DITROPAN -XL) 10 MG 24 hr tablet Take 10 mg by mouth daily. 12/09/22   [provider]  pantoprazole  (PROTONIX ) 40 MG tablet Take 40 mg by mouth daily.    [provider]  potassium chloride  (KLOR-CON ) 10 MEQ tablet Take 10 mEq by mouth daily.    [provider]  rosuvastatin  (CRESTOR ) 5 MG tablet Take 5 mg by mouth daily. 12/09/22   [provider]  senna (SENOKOT) 8.6 MG tablet Take 2 tablets by mouth daily.    [provider]  sertraline  (ZOLOFT ) 25 MG tablet Take 25 mg by mouth daily.    [provider]    Allergies: Patient has no known allergies.    Review of Systems  Constitutional:  Negative for chills and fever.  Respiratory:  Negative for shortness of breath.   Cardiovascular:  Negative for chest pain.  Gastrointestinal:  Negative for abdominal pain and nausea.  Genitourinary:  Negative for dysuria.  Neurological:  Negative for light-headedness.  All other systems reviewed and are negative.   Updated Vital Signs BP (!) 176/70 (BP Location: Right Arm)   Pulse 69   Temp 97.6 F (36.4 C) (Oral)   Resp 20   Wt 118.8 kg   SpO2 93%   BMI 44.96 kg/m   Physical Exam Vitals and nursing note reviewed.  Constitutional:      General: She is not in acute distress.    Appearance: Normal appearance. She is not ill-appearing.  HENT:     Head: Normocephalic and atraumatic.     Nose: Nose normal.  Eyes:     Conjunctiva/sclera: Conjunctivae normal.  Cardiovascular:     Rate and Rhythm: Normal rate and regular rhythm.  Pulmonary:     Effort: Pulmonary effort is normal. No respiratory distress.  Abdominal:     General: There is no distension.     Tenderness: There is no abdominal tenderness. There is no guarding.  Musculoskeletal:        General: No deformity. Normal range of motion.     Cervical back: Normal range of motion.  Skin:    Findings: No rash.  Neurological:     Mental Status: She is alert.     (all labs ordered are listed, but only abnormal results are displayed) Labs Reviewed  COMPREHENSIVE  METABOLIC PANEL WITH GFR - Abnormal; Notable for the following components:      Result Value   Chloride 95 (*)    CO2 38 (*)    Glucose, Bld 108 (*)    BUN 28 (*)    Albumin 3.2 (*)    AST 12 (*)    All other components within normal limits  CBC WITH DIFFERENTIAL/PLATELET - Abnormal; Notable for the following components:   RBC 3.36 (*)    Hemoglobin 10.0 (*)    MCV 109.8 (*)    MCHC 27.1 (*)    Abs Immature Granulocytes 0.24 (*)  All other components within normal limits  I-STAT CHEM 8, ED - Abnormal; Notable for the following components:   Chloride 96 (*)    BUN 29 (*)    Glucose, Bld 104 (*)    TCO2 39 (*)    All other components within normal limits  TROPONIN I (HIGH SENSITIVITY) - Abnormal; Notable for the following components:   Troponin I (High Sensitivity) 21 (*)    All other components within normal limits  RESP PANEL BY RT-PCR (RSV, FLU A&B, COVID)  RVPGX2  CULTURE, BLOOD (ROUTINE X 2)  CULTURE, BLOOD (ROUTINE X 2)  PROTIME-INR  URINALYSIS, W/ REFLEX TO CULTURE (INFECTION SUSPECTED)  I-STAT CG4 LACTIC ACID, ED  I-STAT CG4 LACTIC ACID, ED  TROPONIN I (HIGH SENSITIVITY)    EKG: None  Radiology: DG Chest Portable 1 View Result Date: 11/29/2023 CLINICAL DATA:  Weakness EXAM: PORTABLE CHEST 1 VIEW COMPARISON:  09/18/2023 FINDINGS: The patient is moderately rotated on this examination. Previously noted pleural effusions have resolved. Trace perihilar pulmonary edema persists. No focal consolidation. No pneumothorax. Cardiac size is unchanged. No acute bone abnormality. IMPRESSION: 1. Resolved pleural effusions. 2. Trace perihilar pulmonary edema. Electronically Signed   By: Dorethia Molt M.D.   On: 11/29/2023 19:18     Procedures   Medications Ordered in the ED  sodium chloride  0.9 % bolus 1,000 mL (1,000 mLs Intravenous New Bag/Given 11/29/23 1927)                                    Medical Decision Making Amount and/or Complexity of Data Reviewed Labs:  ordered. Radiology: ordered.  Risk Prescription drug management.   Medical Decision Making / ED Course   This patient presents to the ED for concern of altered mental status from nursing home, this involves an extensive number of treatment options, and is a complaint that carries with it a high risk of complications and morbidity.  The differential diagnosis includes pneumonia, UTI, pyelonephritis, sepsis  MDM: 83 year old female presents today for concern of altered mental status.  According to the nurse at the facility who I spoke with she was refusing medicines and meals which is unusual for her.  On my exam she is alert and oriented x 4.  At baseline she is alert and oriented x 4.  Denies any current symptoms. She states she was upset at the nursing facility because they did not give her her medicines or her meals on time.  Will obtain labs, chest x-ray  CBC within acceptable.  Hemoglobin of 10 which is around her baseline.  CMP without acute concern.  Troponin initially 21, repeat 20.  Lactic acid normal.  UA with evidence of UTI.  Respiratory panel negative.  Chest x-ray without acute cardiopulmonary process.  EKG without acute ischemic change.  Rocephin  given.  Fluids given. Discussed with attending who agrees with plan.  Patient appropriate for discharge back to facility.   Additional history obtained: -Additional history obtained from daughter and, nursing home facility -External records from outside source obtained and reviewed including: Chart review including previous notes, labs, imaging, consultation notes   Lab Tests: -I ordered, reviewed, and interpreted labs.   The pertinent results include:   Labs Reviewed  COMPREHENSIVE METABOLIC PANEL WITH GFR - Abnormal; Notable for the following components:      Result Value   Chloride 95 (*)    CO2 38 (*)    Glucose,  Bld 108 (*)    BUN 28 (*)    Albumin 3.2 (*)    AST 12 (*)    All other components within normal limits   CBC WITH DIFFERENTIAL/PLATELET - Abnormal; Notable for the following components:   RBC 3.36 (*)    Hemoglobin 10.0 (*)    MCV 109.8 (*)    MCHC 27.1 (*)    Abs Immature Granulocytes 0.24 (*)    All other components within normal limits  URINALYSIS, W/ REFLEX TO CULTURE (INFECTION SUSPECTED) - Abnormal; Notable for the following components:   APPearance CLOUDY (*)    Glucose, UA >=500 (*)    Protein, ur 100 (*)    Leukocytes,Ua LARGE (*)    Bacteria, UA MANY (*)    Non Squamous Epithelial 6-10 (*)    All other components within normal limits  I-STAT CHEM 8, ED - Abnormal; Notable for the following components:   Chloride 96 (*)    BUN 29 (*)    Glucose, Bld 104 (*)    TCO2 39 (*)    All other components within normal limits  I-STAT CG4 LACTIC ACID, ED - Abnormal; Notable for the following components:   Lactic Acid, Venous 0.4 (*)    All other components within normal limits  TROPONIN I (HIGH SENSITIVITY) - Abnormal; Notable for the following components:   Troponin I (High Sensitivity) 21 (*)    All other components within normal limits  TROPONIN I (HIGH SENSITIVITY) - Abnormal; Notable for the following components:   Troponin I (High Sensitivity) 20 (*)    All other components within normal limits  RESP PANEL BY RT-PCR (RSV, FLU A&B, COVID)  RVPGX2  CULTURE, BLOOD (ROUTINE X 2)  CULTURE, BLOOD (ROUTINE X 2)  URINE CULTURE  PROTIME-INR  I-STAT CG4 LACTIC ACID, ED      EKG  EKG Interpretation Date/Time:  Saturday November 29 2023 19:23:14 EDT Ventricular Rate:  63 PR Interval:  65 QRS Duration:  140 QT Interval:  430 QTC Calculation: 441 R Axis:   -81  Text Interpretation: Sinus rhythm Atrial premature complex Short PR interval RBBB and LAFB Probable left ventricular hypertrophy Confirmed by Yolande Charleston [phone]) on 11/29/2023 10:44:47 PM         Imaging Studies ordered: I ordered imaging studies including cxr I independently visualized and interpreted  imaging. I agree with the radiologist interpretation   Medicines ordered and prescription drug management: Meds ordered this encounter  Medications   sodium chloride  0.9 % bolus 1,000 mL   cefTRIAXone  (ROCEPHIN ) 1 g in sodium chloride  0.9 % 100 mL IVPB    Antibiotic Indication::   UTI    -I have reviewed the patients home medicines and have made adjustments as needed   Reevaluation: After the interventions noted above, I reevaluated the patient and found that they have :stayed the same  Co morbidities that complicate the patient evaluation  Past Medical History:  Diagnosis Date   (HFpEF) heart failure with preserved ejection fraction (HCC) 12/24/2022   Arthritis    Asthma    Breast mass in female    Bronchitis    Bronchitis    Constipation    Cough    GERD (gastroesophageal reflux disease)    Hypertension associated with diabetes (HCC)    Type 2 diabetes mellitus (HCC)    Wheezing       Dispostion: Discharged in stable condition.  Return precaution discussed.  Patient voices understanding and is in agreement with the plan.  Spoke with and updated daughter on the plan who is in agreement.  Final diagnoses:  Acute cystitis without hematuria    ED Discharge Orders          Ordered    cephALEXin  (KEFLEX ) 500 MG capsule  3 times daily        11/29/23 2303               Hildegard Loge, PA-C 11/29/23 2305    Hildegard Loge, PA-C 11/29/23 2306    Yolande Lamar BROCKS, MD 12/05/23 1027

## 2023-11-30 ENCOUNTER — Other Ambulatory Visit: Payer: Self-pay

## 2023-11-30 ENCOUNTER — Emergency Department (HOSPITAL_COMMUNITY)
Admission: EM | Admit: 2023-11-30 | Discharge: 2023-12-01 | Disposition: A | Discharge status: Home / Self Care | Attending: Emergency Medicine | Admitting: Emergency Medicine

## 2023-11-30 DIAGNOSIS — I509 Heart failure, unspecified: Secondary | ICD-10-CM | POA: Diagnosis not present

## 2023-11-30 DIAGNOSIS — E1159 Type 2 diabetes mellitus with other circulatory complications: Secondary | ICD-10-CM | POA: Diagnosis not present

## 2023-11-30 DIAGNOSIS — Z794 Long term (current) use of insulin: Secondary | ICD-10-CM | POA: Diagnosis not present

## 2023-11-30 DIAGNOSIS — J302 Other seasonal allergic rhinitis: Secondary | ICD-10-CM | POA: Diagnosis not present

## 2023-11-30 DIAGNOSIS — I10 Essential (primary) hypertension: Secondary | ICD-10-CM | POA: Diagnosis not present

## 2023-11-30 DIAGNOSIS — K219 Gastro-esophageal reflux disease without esophagitis: Secondary | ICD-10-CM | POA: Diagnosis not present

## 2023-11-30 DIAGNOSIS — E119 Type 2 diabetes mellitus without complications: Secondary | ICD-10-CM | POA: Diagnosis not present

## 2023-11-30 DIAGNOSIS — R531 Weakness: Secondary | ICD-10-CM | POA: Diagnosis not present

## 2023-11-30 DIAGNOSIS — Z7901 Long term (current) use of anticoagulants: Secondary | ICD-10-CM | POA: Diagnosis not present

## 2023-11-30 DIAGNOSIS — R109 Unspecified abdominal pain: Secondary | ICD-10-CM | POA: Diagnosis not present

## 2023-11-30 DIAGNOSIS — Z0189 Encounter for other specified special examinations: Secondary | ICD-10-CM | POA: Diagnosis not present

## 2023-11-30 DIAGNOSIS — J45909 Unspecified asthma, uncomplicated: Secondary | ICD-10-CM | POA: Diagnosis not present

## 2023-11-30 DIAGNOSIS — Z87891 Personal history of nicotine dependence: Secondary | ICD-10-CM | POA: Insufficient documentation

## 2023-11-30 DIAGNOSIS — R1310 Dysphagia, unspecified: Secondary | ICD-10-CM | POA: Diagnosis not present

## 2023-11-30 DIAGNOSIS — B957 Other staphylococcus as the cause of diseases classified elsewhere: Secondary | ICD-10-CM | POA: Diagnosis not present

## 2023-11-30 DIAGNOSIS — F0390 Unspecified dementia without behavioral disturbance: Secondary | ICD-10-CM | POA: Diagnosis not present

## 2023-11-30 DIAGNOSIS — Z79899 Other long term (current) drug therapy: Secondary | ICD-10-CM | POA: Diagnosis not present

## 2023-11-30 DIAGNOSIS — I110 Hypertensive heart disease with heart failure: Secondary | ICD-10-CM | POA: Diagnosis not present

## 2023-11-30 DIAGNOSIS — R404 Transient alteration of awareness: Secondary | ICD-10-CM | POA: Diagnosis not present

## 2023-11-30 DIAGNOSIS — Z7951 Long term (current) use of inhaled steroids: Secondary | ICD-10-CM | POA: Insufficient documentation

## 2023-11-30 DIAGNOSIS — I5032 Chronic diastolic (congestive) heart failure: Secondary | ICD-10-CM | POA: Diagnosis not present

## 2023-11-30 DIAGNOSIS — Z01812 Encounter for preprocedural laboratory examination: Secondary | ICD-10-CM | POA: Diagnosis not present

## 2023-11-30 DIAGNOSIS — R0902 Hypoxemia: Secondary | ICD-10-CM | POA: Diagnosis not present

## 2023-11-30 DIAGNOSIS — G4733 Obstructive sleep apnea (adult) (pediatric): Secondary | ICD-10-CM | POA: Diagnosis not present

## 2023-11-30 LAB — BLOOD CULTURE ID PANEL (REFLEXED) - BCID2

## 2023-11-30 LAB — CBC WITH DIFFERENTIAL/PLATELET
Abs Granulocyte: 3.7 K/uL (ref 1.5–6.5)
Abs Immature Granulocytes: 0.05 K/uL (ref 0.00–0.07)
Basophils Absolute: 0 K/uL (ref 0.0–0.1)
Basophils Relative: 0 %
Eosinophils Absolute: 0.2 K/uL (ref 0.0–0.5)
Eosinophils Relative: 3 %
HCT: 31.9 % — ABNORMAL LOW (ref 36.0–46.0)
Hemoglobin: 8.8 g/dL — ABNORMAL LOW (ref 12.0–15.0)
Immature Granulocytes: 1 %
Lymphocytes Relative: 17 %
Lymphs Abs: 1 K/uL (ref 0.7–4.0)
MCH: 29.6 pg (ref 26.0–34.0)
MCHC: 27.6 g/dL — ABNORMAL LOW (ref 30.0–36.0)
MCV: 107.4 fL — ABNORMAL HIGH (ref 80.0–100.0)
Monocytes Absolute: 0.7 K/uL (ref 0.1–1.0)
Monocytes Relative: 12 %
Neutro Abs: 3.7 K/uL (ref 1.7–7.7)
Neutrophils Relative %: 67 %
Platelets: 140 K/uL — ABNORMAL LOW (ref 150–400)
RBC: 2.97 MIL/uL — ABNORMAL LOW (ref 3.87–5.11)
RDW: 14.4 % (ref 11.5–15.5)
WBC: 5.5 K/uL (ref 4.0–10.5)
nRBC: 0 % (ref 0.0–0.2)

## 2023-11-30 LAB — BASIC METABOLIC PANEL WITH GFR
Anion gap: 6 (ref 5–15)
BUN: 24 mg/dL — ABNORMAL HIGH (ref 8–23)
CO2: 36 mmol/L — ABNORMAL HIGH (ref 22–32)
Calcium: 9.2 mg/dL (ref 8.9–10.3)
Chloride: 99 mmol/L (ref 98–111)
Creatinine, Ser: 0.52 mg/dL (ref 0.44–1.00)
GFR, Estimated: >60 mL/min (ref >60)
Glucose, Bld: 104 mg/dL — ABNORMAL HIGH (ref 70–99)
Potassium: 4.6 mmol/L (ref 3.5–5.1)
Sodium: 141 mmol/L (ref 135–145)

## 2023-11-30 MED ORDER — SODIUM CHLORIDE 0.9% FLUSH: 10.0000 mL | INTRAVENOUS | Status: DC | PRN

## 2023-11-30 NOTE — Discharge Instructions (Signed)
 We evaluated Elizabeth [name] for her positive blood culture result.  We suspect that this was a contaminant.  We have sent repeat blood culture testing.  If her repeat blood culture also returns positive, we will call her back to the hospital.  She should continue to take the Keflex  that was prescribed yesterday for her urine infection.  Please also send her back to the hospital if she develops any fevers, confusion, weakness, or any other concerning symptoms.

## 2023-11-30 NOTE — ED Provider Notes (Signed)
 [place] EMERGENCY DEPARTMENT AT [facility] Provider Note  CSN: [id number] Arrival date & time: 11/30/23 2103  Chief Complaint(s) Abnormal Labs  HPI Elizabeth [name] is a 83 y.o. female history of CHF, dementia, hypertension, diabetes presenting to the emergency department with positive blood culture.  Patient had positive blood culture result today which was identified as Staph epidermidis.  She was recently here yesterday for reportedly altered mental status however was found to be at her baseline.  She was diagnosed with a possible UTI and prescribed antibiotics.  She denies any complaints currently, reports no fevers, chills, chest pain, shortness of breath, abdominal pain, leg swelling, any other new symptoms.  Reviewing vitals from previous ER visit, no fevers.   Past Medical History Past Medical History:  Diagnosis Date   (HFpEF) heart failure with preserved ejection fraction (HCC) 12/24/2022   Arthritis    Asthma    Breast mass in female    Bronchitis    Bronchitis    Constipation    Cough    GERD (gastroesophageal reflux disease)    Hypertension associated with diabetes (HCC)    Type 2 diabetes mellitus (HCC)    Wheezing    Patient Active Problem List   Diagnosis Date Noted   Acute respiratory failure with hypercapnia (HCC) 09/17/2023   Hypomagnesemia 09/15/2023   Hypophosphatemia 09/15/2023   Acute metabolic encephalopathy 09/14/2023   Reactive airway disease with acute exacerbation 09/14/2023   Acute on chronic diastolic CHF (congestive heart failure) (HCC) 09/14/2023   Colitis 04/24/2023   Sepsis (HCC) 04/23/2023   (HFpEF) heart failure with preserved ejection fraction (HCC) 12/24/2022   Morbid obesity (HCC) 12/24/2022   OSA (obstructive sleep apnea) 03/28/2022   Diabetes mellitus without complication (HCC) 06/19/2021   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 Authorizing Provider  acetaminophen  (TYLENOL ) 500 MG tablet Take 1,000 mg by mouth in the morning and at bedtime.    [provider]  albuterol  (PROVENTIL  HFA;VENTOLIN  HFA) 108 (90 BASE) MCG/ACT inhaler Inhale 2 puffs into the lungs every 6 (six) hours as needed for shortness of breath.    [provider]  albuterol  (PROVENTIL ) (2.5 MG/3ML) 0.083% nebulizer solution Take 2.5 mg by nebulization every 6 (six) hours as needed for wheezing or shortness of breath.    [provider]  apixaban  (ELIQUIS ) 5 MG TABS tablet Take 1 tablet (5 mg total) by mouth 2 (two) times daily. 04/28/23   Amin, Ankit C, MD  bisacodyl  (DULCOLAX) 5 MG EC tablet Take 2 tablets (10 mg total) by mouth daily as needed for moderate constipation or severe constipation. 04/28/23   Amin, Burgess BROCKS, MD  budesonide -formoterol  (SYMBICORT) 160-4.5 MCG/ACT inhaler Inhale 2 puffs into the lungs 2 (two) times daily.    [provider]  carvedilol  (COREG ) 3.125 MG tablet Take 1 tablet (3.125 mg total) by mouth 2 (two) times daily. 09/18/23 09/17/24  Shalhoub, Zachary PARAS, MD  cephALEXin  (KEFLEX ) 500 MG capsule Take 1 capsule (500 mg total) by mouth 3 (three) times  daily for 7 days. 11/29/23 12/06/23  Hildegard Loge, PA-C  cetirizine  (ZYRTEC ) 10 MG tablet Take 10 mg by mouth daily.    [provider]  cyanocobalamin  (VITAMIN B12) 1000 MCG tablet Take 1,000 mcg by mouth daily.    [provider]  diclofenac  Sodium (VOLTAREN ) 1 % GEL Apply 1 Application topically 4 (four) times daily. 04/24/21    [provider]  donepezil  (ARICEPT ) 5 MG tablet Take 1 tablet (5 mg total) by mouth daily. 07/30/23   Wertman, Sara E, PA-C  fluticasone  (FLONASE ) 50 MCG/ACT nasal spray PLACE 1 SPRAY INTO BOTH NOSTRILS 2 (TWO) TIMES DAILY 09/13/22   Shellia Oh, MD  furosemide  (LASIX ) 40 MG tablet Take 1 tablet (40 mg total) by mouth daily as needed. Weigh yourself daily.  Take one dose by mouth if your weight increases by more than 2 pounds in one day or 5 pounds in one week. 09/18/23 09/17/24  Shalhoub, Zachary PARAS, MD  gabapentin  (NEURONTIN ) 100 MG capsule Take 100 mg by mouth daily. Take 200 mg by mouth three times daily. Take 300 mg by mouth at bedtime. 03/27/23   [provider]  hydrALAZINE  (APRESOLINE ) 50 MG tablet Take 25 mg by mouth every 8 (eight) hours as needed (hypertension). 04/10/23   [provider]  insulin  aspart (NOVOLOG ) 100 UNIT/ML injection Inject 0-15 Units into the skin 4 (four) times daily -  before meals and at bedtime. 09/18/23   Shalhoub, Zachary PARAS, MD  JARDIANCE 10 MG TABS tablet Take 10 mg by mouth daily. 04/09/23   [provider]  lidocaine  (LIDODERM ) 5 % Place 1 patch onto the skin at bedtime. 03/13/23   [provider]  losartan  (COZAAR ) 100 MG tablet Take 100 mg by mouth daily.    [provider]  Menthol, Topical Analgesic, (BIOFREEZE COOL THE PAIN EX) Apply 1 application  topically in the morning and at bedtime. Apply to BL hands and ankles    [provider]  Multiple Vitamins-Minerals (CENTRUM SILVER PO) Take 1 tablet by mouth daily.    [provider]  oxybutynin  (DITROPAN -XL) 10 MG 24 hr tablet Take 10 mg by mouth daily. 12/09/22   [provider]  pantoprazole  (PROTONIX ) 40 MG tablet Take 40 mg by mouth daily.    [provider]  potassium chloride  (KLOR-CON ) 10 MEQ tablet Take 10 mEq by mouth daily.    [provider]  rosuvastatin  (CRESTOR ) 5 MG tablet Take 5 mg by mouth daily. 12/09/22    [provider]  senna (SENOKOT) 8.6 MG tablet Take 2 tablets by mouth daily.    [provider]  sertraline (ZOLOFT) 25 MG tablet Take 25 mg by mouth daily.    [provider]  Past Surgical History Past Surgical History:  Procedure Laterality Date   ABDOMINAL HYSTERECTOMY  1996   CYSTECTOMY  1980's   back of neck   THYROID  SURGERY  2001   Family History Family History  Problem Relation Age of Onset   Diabetes Mother    Heart disease Mother    Cancer Maternal Uncle     Social History Social History   Tobacco Use   Smoking status: Former    Current packs/day: 0.00    Types: Cigarettes    Quit date: 01/17/1964    Years since quitting: 59.9   Smokeless tobacco: Never  Vaping Use   Vaping status: Never Used  Substance Use Topics   Alcohol  use: No   Drug use: No   Allergies Patient has no known allergies.  Review of Systems Review of Systems  All other systems reviewed and are negative.   Physical Exam Vital Signs  I have reviewed the triage vital signs BP (!) 116/100   Pulse 74   Temp 97.6 F (36.4 C) (Oral)   Resp 20   SpO2 98%  Physical Exam Vitals and nursing note reviewed.  Constitutional:      General: She is not in acute distress.    Appearance: She is well-developed. She is obese.  HENT:     Head: Normocephalic and atraumatic.     Mouth/Throat:     Mouth: Mucous membranes are moist.  Eyes:     Pupils: Pupils are equal, round, and reactive to light.  Cardiovascular:     Rate and Rhythm: Normal rate and regular rhythm.     Heart sounds: No murmur heard. Pulmonary:     Effort: Pulmonary effort is normal. No respiratory distress.     Breath sounds: Normal breath sounds.  Abdominal:     General: Abdomen is flat.     Palpations: Abdomen is soft.     Tenderness: There is no abdominal  tenderness.  Musculoskeletal:        General: No tenderness.     Right lower leg: No edema.     Left lower leg: No edema.  Skin:    General: Skin is warm and dry.  Neurological:     General: No focal deficit present.     Mental Status: She is alert and oriented to person, place, and time. Mental status is at baseline.  Psychiatric:        Mood and Affect: Mood normal.        Behavior: Behavior normal.     ED Results and Treatments Labs (all labs ordered are listed, but only abnormal results are displayed) Labs Reviewed  BASIC METABOLIC PANEL WITH GFR - Abnormal; Notable for the following components:      Result Value   CO2 36 (*)    Glucose, Bld 104 (*)    BUN 24 (*)    All other components within normal limits  CBC WITH DIFFERENTIAL/PLATELET - Abnormal; Notable for the following components:   RBC 2.97 (*)    Hemoglobin 8.8 (*)    HCT 31.9 (*)    MCV 107.4 (*)    MCHC 27.6 (*)    Platelets 140 (*)    All other components within normal limits  CULTURE, BLOOD (ROUTINE X 2)  CULTURE, BLOOD (ROUTINE X 2)  Radiology No results found.  Pertinent labs & imaging results that were available during my care of the patient were reviewed by me and considered in my medical decision making (see MDM for details).  Medications Ordered in ED Medications  sodium chloride  flush (NS) 0.9 % injection 10-40 mL (has no administration in time range)                                                                                                                                     Procedures Procedures  (including critical care time)  Medical Decision Making / ED Course   MDM:  83 year old presenting to the emergency department with abnormal blood culture result.  Reviewed blood cultures which show Staph epidermidis.  Suspect this is a contaminant.  Patient is not  febrile, does not appear septic, has no leukocytosis.  We have obtained repeat blood cultures for surveillance.  She is currently taking Keflex  which should treat this in either case.  Feel patient is stable for discharge to home and can come back if she develops fever or has any further positive results.      Additional history obtained: -Additional history obtained from ems -External records from outside source obtained and reviewed including: Chart review including previous notes, labs, imaging, consultation notes including prior er visit    Lab Tests: -I ordered, reviewed, and interpreted labs.   The pertinent results include:   Labs Reviewed  BASIC METABOLIC PANEL WITH GFR - Abnormal; Notable for the following components:      Result Value   CO2 36 (*)    Glucose, Bld 104 (*)    BUN 24 (*)    All other components within normal limits  CBC WITH DIFFERENTIAL/PLATELET - Abnormal; Notable for the following components:   RBC 2.97 (*)    Hemoglobin 8.8 (*)    HCT 31.9 (*)    MCV 107.4 (*)    MCHC 27.6 (*)    Platelets 140 (*)    All other components within normal limits  CULTURE, BLOOD (ROUTINE X 2)  CULTURE, BLOOD (ROUTINE X 2)    Notable for no leukocytosis    Medicines ordered and prescription drug management: Meds ordered this encounter  Medications   sodium chloride  flush (NS) 0.9 % injection 10-40 mL    -I have reviewed the patients home medicines and have made adjustments as needed  Co morbidities that complicate the patient evaluation  Past Medical History:  Diagnosis Date   (HFpEF) heart failure with preserved ejection fraction (HCC) 12/24/2022   Arthritis    Asthma    Breast mass in female    Bronchitis    Bronchitis    Constipation    Cough    GERD (gastroesophageal reflux disease)    Hypertension associated with diabetes (HCC)    Type 2 diabetes mellitus (HCC)    Wheezing       Dispostion: Disposition decision  including need for hospitalization  was considered, and patient discharged from emergency department.    Final Clinical Impression(s) / ED Diagnoses Final diagnoses:  Encounter for laboratory examination     This chart was dictated using voice recognition software.  Despite best efforts to proofread,  errors can occur which can change the documentation meaning.    Francesca Elsie CROME, MD 11/30/23 2306

## 2023-11-30 NOTE — ED Notes (Signed)
 Unsuccessful IV attempt x2.

## 2023-11-30 NOTE — ED Triage Notes (Signed)
 Patient arrived from [facility] after positive blood culture results, no complaints at this time. Seen yesterday for AMS.

## 2023-12-01 DIAGNOSIS — R7889 Finding of other specified substances, not normally found in blood: Secondary | ICD-10-CM | POA: Diagnosis not present

## 2023-12-01 DIAGNOSIS — Z7401 Bed confinement status: Secondary | ICD-10-CM | POA: Diagnosis not present

## 2023-12-01 LAB — URINE CULTURE

## 2023-12-02 LAB — CULTURE, BLOOD (ROUTINE X 2)

## 2023-12-03 ENCOUNTER — Telehealth (HOSPITAL_BASED_OUTPATIENT_CLINIC_OR_DEPARTMENT_OTHER): Payer: Self-pay | Admitting: *Deleted

## 2023-12-03 NOTE — Telephone Encounter (Signed)
 Post ED Visit - Positive Culture Follow-up  Culture report reviewed by antimicrobial stewardship pharmacist: [facility] []  Rankin Dee, Pharm.D. []  Venetia Gully, Pharm.D., BCPS AQ-ID []  Garrel Crews, Pharm.D., BCPS []  Almarie Lunger, Pharm.D., BCPS []  [place], [address].D., BCPS, AAHIVP []  Rosaline Bihari, Pharm.D., BCPS, AAHIVP []  Vernell Meier, PharmD, BCPS []  Latanya Hint, PharmD, BCPS []  Donald Medley, PharmD, BCPS []  Rocky Bold, PharmD []  Dorothyann Alert, PharmD, BCPS []  Morene Babe, PharmD  [facility] [x]  Rosaline Edison, PharmD []  Romona Bliss, PharmD []  Dolphus Roller, PharmD []  Veva Seip, Rph []  Vernell Daunt) Leonce, PharmD []  Eva Allis, PharmD []  Rosaline Millet, PharmD []  Iantha Batch, PharmD []  Arvin Gauss, PharmD []  Wanda Hasting, PharmD []  Ronal Rav, PharmD []  Rocky Slade, PharmD []  Bard Jeans, PharmD   Positive Blood culture Treated with Cephalexin , organism sensitive to the same and no further patient follow-up is required at this time. Ruled to be a contaminant by MD   Jama Wyman Kipper 12/03/2023, 4:39 PM

## 2023-12-04 DIAGNOSIS — I10 Essential (primary) hypertension: Secondary | ICD-10-CM | POA: Diagnosis not present

## 2023-12-04 DIAGNOSIS — J9612 Chronic respiratory failure with hypercapnia: Secondary | ICD-10-CM | POA: Diagnosis not present

## 2023-12-04 DIAGNOSIS — I5030 Unspecified diastolic (congestive) heart failure: Secondary | ICD-10-CM | POA: Diagnosis not present

## 2023-12-04 DIAGNOSIS — K5900 Constipation, unspecified: Secondary | ICD-10-CM | POA: Diagnosis not present

## 2023-12-04 LAB — CULTURE, BLOOD (ROUTINE X 2)
Culture: NO GROWTH
Special Requests: ADEQUATE

## 2023-12-05 ENCOUNTER — Emergency Department (HOSPITAL_COMMUNITY)

## 2023-12-05 ENCOUNTER — Encounter (HOSPITAL_COMMUNITY): Payer: Self-pay

## 2023-12-05 ENCOUNTER — Other Ambulatory Visit: Payer: Self-pay

## 2023-12-05 ENCOUNTER — Inpatient Hospital Stay (HOSPITAL_COMMUNITY)
Admission: EM | Admit: 2023-12-05 | Discharge: 2023-12-08 | DRG: 871 | Disposition: A | Discharge status: Skilled Nursing Facility | Source: Skilled Nursing Facility | Attending: Internal Medicine | Admitting: Internal Medicine

## 2023-12-05 DIAGNOSIS — I10 Essential (primary) hypertension: Secondary | ICD-10-CM | POA: Diagnosis present

## 2023-12-05 DIAGNOSIS — I499 Cardiac arrhythmia, unspecified: Secondary | ICD-10-CM | POA: Diagnosis not present

## 2023-12-05 DIAGNOSIS — Z794 Long term (current) use of insulin: Secondary | ICD-10-CM | POA: Diagnosis not present

## 2023-12-05 DIAGNOSIS — I509 Heart failure, unspecified: Secondary | ICD-10-CM | POA: Diagnosis not present

## 2023-12-05 DIAGNOSIS — R918 Other nonspecific abnormal finding of lung field: Secondary | ICD-10-CM | POA: Diagnosis not present

## 2023-12-05 DIAGNOSIS — I4510 Unspecified right bundle-branch block: Secondary | ICD-10-CM | POA: Diagnosis not present

## 2023-12-05 DIAGNOSIS — I872 Venous insufficiency (chronic) (peripheral): Secondary | ICD-10-CM | POA: Diagnosis present

## 2023-12-05 DIAGNOSIS — Z7901 Long term (current) use of anticoagulants: Secondary | ICD-10-CM

## 2023-12-05 DIAGNOSIS — E1159 Type 2 diabetes mellitus with other circulatory complications: Secondary | ICD-10-CM | POA: Diagnosis present

## 2023-12-05 DIAGNOSIS — E119 Type 2 diabetes mellitus without complications: Secondary | ICD-10-CM | POA: Diagnosis not present

## 2023-12-05 DIAGNOSIS — R400 Somnolence: Secondary | ICD-10-CM | POA: Diagnosis present

## 2023-12-05 DIAGNOSIS — Z66 Do not resuscitate: Secondary | ICD-10-CM | POA: Diagnosis present

## 2023-12-05 DIAGNOSIS — Z90710 Acquired absence of both cervix and uterus: Secondary | ICD-10-CM

## 2023-12-05 DIAGNOSIS — R6510 Systemic inflammatory response syndrome (SIRS) of non-infectious origin without acute organ dysfunction: Secondary | ICD-10-CM | POA: Diagnosis present

## 2023-12-05 DIAGNOSIS — Z87440 Personal history of urinary (tract) infections: Secondary | ICD-10-CM

## 2023-12-05 DIAGNOSIS — J9621 Acute and chronic respiratory failure with hypoxia: Secondary | ICD-10-CM | POA: Diagnosis not present

## 2023-12-05 DIAGNOSIS — Z515 Encounter for palliative care: Secondary | ICD-10-CM | POA: Diagnosis not present

## 2023-12-05 DIAGNOSIS — I491 Atrial premature depolarization: Secondary | ICD-10-CM | POA: Diagnosis not present

## 2023-12-05 DIAGNOSIS — R404 Transient alteration of awareness: Secondary | ICD-10-CM | POA: Diagnosis not present

## 2023-12-05 DIAGNOSIS — Z7984 Long term (current) use of oral hypoglycemic drugs: Secondary | ICD-10-CM

## 2023-12-05 DIAGNOSIS — J45909 Unspecified asthma, uncomplicated: Secondary | ICD-10-CM | POA: Diagnosis present

## 2023-12-05 DIAGNOSIS — I152 Hypertension secondary to endocrine disorders: Secondary | ICD-10-CM | POA: Diagnosis not present

## 2023-12-05 DIAGNOSIS — R0602 Shortness of breath: Secondary | ICD-10-CM | POA: Diagnosis not present

## 2023-12-05 DIAGNOSIS — E1122 Type 2 diabetes mellitus with diabetic chronic kidney disease: Secondary | ICD-10-CM | POA: Diagnosis present

## 2023-12-05 DIAGNOSIS — J811 Chronic pulmonary edema: Secondary | ICD-10-CM | POA: Diagnosis not present

## 2023-12-05 DIAGNOSIS — Z8616 Personal history of COVID-19: Secondary | ICD-10-CM

## 2023-12-05 DIAGNOSIS — R0689 Other abnormalities of breathing: Secondary | ICD-10-CM

## 2023-12-05 DIAGNOSIS — R531 Weakness: Secondary | ICD-10-CM | POA: Diagnosis not present

## 2023-12-05 DIAGNOSIS — K5900 Constipation, unspecified: Secondary | ICD-10-CM | POA: Diagnosis not present

## 2023-12-05 DIAGNOSIS — J9622 Acute and chronic respiratory failure with hypercapnia: Secondary | ICD-10-CM | POA: Diagnosis present

## 2023-12-05 DIAGNOSIS — N179 Acute kidney failure, unspecified: Secondary | ICD-10-CM | POA: Diagnosis not present

## 2023-12-05 DIAGNOSIS — Z7189 Other specified counseling: Secondary | ICD-10-CM | POA: Diagnosis not present

## 2023-12-05 DIAGNOSIS — Z833 Family history of diabetes mellitus: Secondary | ICD-10-CM

## 2023-12-05 DIAGNOSIS — R413 Other amnesia: Secondary | ICD-10-CM | POA: Diagnosis not present

## 2023-12-05 DIAGNOSIS — Z87891 Personal history of nicotine dependence: Secondary | ICD-10-CM

## 2023-12-05 DIAGNOSIS — R0989 Other specified symptoms and signs involving the circulatory and respiratory systems: Secondary | ICD-10-CM | POA: Diagnosis not present

## 2023-12-05 DIAGNOSIS — Z8249 Family history of ischemic heart disease and other diseases of the circulatory system: Secondary | ICD-10-CM

## 2023-12-05 DIAGNOSIS — J9601 Acute respiratory failure with hypoxia: Secondary | ICD-10-CM | POA: Diagnosis present

## 2023-12-05 DIAGNOSIS — E785 Hyperlipidemia, unspecified: Secondary | ICD-10-CM | POA: Diagnosis present

## 2023-12-05 DIAGNOSIS — Z6841 Body Mass Index (BMI) 40.0 and over, adult: Secondary | ICD-10-CM | POA: Diagnosis not present

## 2023-12-05 DIAGNOSIS — D539 Nutritional anemia, unspecified: Secondary | ICD-10-CM | POA: Diagnosis not present

## 2023-12-05 DIAGNOSIS — G4733 Obstructive sleep apnea (adult) (pediatric): Secondary | ICD-10-CM | POA: Diagnosis not present

## 2023-12-05 DIAGNOSIS — E860 Dehydration: Secondary | ICD-10-CM | POA: Diagnosis present

## 2023-12-05 DIAGNOSIS — N390 Urinary tract infection, site not specified: Secondary | ICD-10-CM | POA: Diagnosis not present

## 2023-12-05 DIAGNOSIS — J90 Pleural effusion, not elsewhere classified: Secondary | ICD-10-CM | POA: Diagnosis not present

## 2023-12-05 DIAGNOSIS — A419 Sepsis, unspecified organism: Principal | ICD-10-CM | POA: Diagnosis present

## 2023-12-05 DIAGNOSIS — Z7401 Bed confinement status: Secondary | ICD-10-CM | POA: Diagnosis not present

## 2023-12-05 DIAGNOSIS — L89312 Pressure ulcer of right buttock, stage 2: Secondary | ICD-10-CM | POA: Diagnosis present

## 2023-12-05 DIAGNOSIS — N289 Disorder of kidney and ureter, unspecified: Secondary | ICD-10-CM | POA: Diagnosis not present

## 2023-12-05 DIAGNOSIS — G9341 Metabolic encephalopathy: Secondary | ICD-10-CM | POA: Diagnosis not present

## 2023-12-05 DIAGNOSIS — Z743 Need for continuous supervision: Secondary | ICD-10-CM | POA: Diagnosis not present

## 2023-12-05 DIAGNOSIS — I480 Paroxysmal atrial fibrillation: Secondary | ICD-10-CM | POA: Diagnosis not present

## 2023-12-05 DIAGNOSIS — L8990 Pressure ulcer of unspecified site, unspecified stage: Secondary | ICD-10-CM | POA: Diagnosis present

## 2023-12-05 DIAGNOSIS — R6520 Severe sepsis without septic shock: Secondary | ICD-10-CM | POA: Diagnosis not present

## 2023-12-05 DIAGNOSIS — I5033 Acute on chronic diastolic (congestive) heart failure: Secondary | ICD-10-CM | POA: Diagnosis not present

## 2023-12-05 DIAGNOSIS — Z7951 Long term (current) use of inhaled steroids: Secondary | ICD-10-CM

## 2023-12-05 DIAGNOSIS — I5030 Unspecified diastolic (congestive) heart failure: Secondary | ICD-10-CM | POA: Diagnosis not present

## 2023-12-05 DIAGNOSIS — K219 Gastro-esophageal reflux disease without esophagitis: Secondary | ICD-10-CM | POA: Diagnosis present

## 2023-12-05 DIAGNOSIS — Z79899 Other long term (current) drug therapy: Secondary | ICD-10-CM

## 2023-12-05 DIAGNOSIS — I5032 Chronic diastolic (congestive) heart failure: Secondary | ICD-10-CM | POA: Diagnosis present

## 2023-12-05 LAB — URINALYSIS, W/ REFLEX TO CULTURE (INFECTION SUSPECTED)
Bacteria, UA: NONE SEEN
Bilirubin Urine: NEGATIVE
Glucose, UA: 50 mg/dL — AB
Ketones, ur: NEGATIVE mg/dL
Nitrite: NEGATIVE
Protein, ur: >=300 mg/dL — AB
RBC / HPF: >50 RBC/hpf (ref 0–5)
Specific Gravity, Urine: 1.018 (ref 1.005–1.030)
pH: 5 (ref 5.0–8.0)

## 2023-12-05 LAB — PROCALCITONIN: Procalcitonin: <0.1 ng/mL

## 2023-12-05 LAB — I-STAT CHEM 8, ED
BUN: 27 mg/dL — ABNORMAL HIGH (ref 8–23)
Calcium, Ion: 1.14 mmol/L — ABNORMAL LOW (ref 1.15–1.40)
Chloride: 97 mmol/L — ABNORMAL LOW (ref 98–111)
Creatinine, Ser: 1.3 mg/dL — ABNORMAL HIGH (ref 0.44–1.00)
Glucose, Bld: 118 mg/dL — ABNORMAL HIGH (ref 70–99)
HCT: 32 % — ABNORMAL LOW (ref 36.0–46.0)
Hemoglobin: 10.9 g/dL — ABNORMAL LOW (ref 12.0–15.0)
Potassium: 5.5 mmol/L — ABNORMAL HIGH (ref 3.5–5.1)
Sodium: 137 mmol/L (ref 135–145)
TCO2: 37 mmol/L — ABNORMAL HIGH (ref 22–32)

## 2023-12-05 LAB — CBC WITH DIFFERENTIAL/PLATELET
Abs Immature Granulocytes: 0.04 K/uL (ref 0.00–0.07)
Basophils Absolute: 0 K/uL (ref 0.0–0.1)
Basophils Relative: 0 %
Eosinophils Absolute: 0 K/uL (ref 0.0–0.5)
Eosinophils Relative: 1 %
HCT: 34.6 % — ABNORMAL LOW (ref 36.0–46.0)
Hemoglobin: 9.4 g/dL — ABNORMAL LOW (ref 12.0–15.0)
Immature Granulocytes: 1 %
Lymphocytes Relative: 12 %
Lymphs Abs: 0.5 K/uL — ABNORMAL LOW (ref 0.7–4.0)
MCH: 29.8 pg (ref 26.0–34.0)
MCHC: 27.2 g/dL — ABNORMAL LOW (ref 30.0–36.0)
MCV: 109.8 fL — ABNORMAL HIGH (ref 80.0–100.0)
Monocytes Absolute: 0.4 K/uL (ref 0.1–1.0)
Monocytes Relative: 10 %
Neutro Abs: 3 K/uL (ref 1.7–7.7)
Neutrophils Relative %: 76 %
Platelets: 162 K/uL (ref 150–400)
RBC: 3.15 MIL/uL — ABNORMAL LOW (ref 3.87–5.11)
RDW: 14.5 % (ref 11.5–15.5)
WBC: 3.9 K/uL — ABNORMAL LOW (ref 4.0–10.5)
nRBC: 0 % (ref 0.0–0.2)

## 2023-12-05 LAB — COMPREHENSIVE METABOLIC PANEL WITH GFR
ALT: 11 U/L (ref 0–44)
AST: 11 U/L — ABNORMAL LOW (ref 15–41)
Albumin: 3 g/dL — ABNORMAL LOW (ref 3.5–5.0)
Alkaline Phosphatase: 44 U/L (ref 38–126)
Anion gap: 11 (ref 5–15)
BUN: 23 mg/dL (ref 8–23)
CO2: 34 mmol/L — ABNORMAL HIGH (ref 22–32)
Calcium: 9.3 mg/dL (ref 8.9–10.3)
Chloride: 93 mmol/L — ABNORMAL LOW (ref 98–111)
Creatinine, Ser: 1.08 mg/dL — ABNORMAL HIGH (ref 0.44–1.00)
GFR, Estimated: 51 mL/min — ABNORMAL LOW (ref >60)
Glucose, Bld: 118 mg/dL — ABNORMAL HIGH (ref 70–99)
Potassium: 5.5 mmol/L — ABNORMAL HIGH (ref 3.5–5.1)
Sodium: 138 mmol/L (ref 135–145)
Total Bilirubin: 0.3 mg/dL (ref 0.0–1.2)
Total Protein: 6.4 g/dL — ABNORMAL LOW (ref 6.5–8.1)

## 2023-12-05 LAB — I-STAT VENOUS BLOOD GAS, ED
Acid-Base Excess: 11 mmol/L — ABNORMAL HIGH (ref 0.0–2.0)
Acid-Base Excess: 12 mmol/L — ABNORMAL HIGH (ref 0.0–2.0)
Bicarbonate: 39.6 mmol/L — ABNORMAL HIGH (ref 20.0–28.0)
Bicarbonate: 38.3 mmol/L — ABNORMAL HIGH (ref 20.0–28.0)
Calcium, Ion: 1.16 mmol/L (ref 1.15–1.40)
Calcium, Ion: 1.12 mmol/L — ABNORMAL LOW (ref 1.15–1.40)
HCT: 32 % — ABNORMAL LOW (ref 36.0–46.0)
HCT: 28 % — ABNORMAL LOW (ref 36.0–46.0)
Hemoglobin: 10.9 g/dL — ABNORMAL LOW (ref 12.0–15.0)
Hemoglobin: 9.5 g/dL — ABNORMAL LOW (ref 12.0–15.0)
O2 Saturation: 97 %
O2 Saturation: 100 %
Potassium: 5.4 mmol/L — ABNORMAL HIGH (ref 3.5–5.1)
Potassium: 5.1 mmol/L (ref 3.5–5.1)
Sodium: 136 mmol/L (ref 135–145)
Sodium: 139 mmol/L (ref 135–145)
TCO2: 42 mmol/L — ABNORMAL HIGH (ref 22–32)
TCO2: 40 mmol/L — ABNORMAL HIGH (ref 22–32)
pCO2, Ven: 73.9 mmHg (ref 44–60)
pCO2, Ven: 58.4 mmHg (ref 44–60)
pH, Ven: 7.337 (ref 7.25–7.43)
pH, Ven: 7.424 (ref 7.25–7.43)
pO2, Ven: 97 mmHg — ABNORMAL HIGH (ref 32–45)
pO2, Ven: 170 mmHg — ABNORMAL HIGH (ref 32–45)

## 2023-12-05 LAB — PROTIME-INR
INR: 1.1 (ref 0.8–1.2)
Prothrombin Time: 15 s (ref 11.4–15.2)

## 2023-12-05 LAB — MRSA NEXT GEN BY PCR, NASAL: MRSA by PCR Next Gen: NOT DETECTED

## 2023-12-05 LAB — C-REACTIVE PROTEIN: CRP: 1.7 mg/dL — ABNORMAL HIGH (ref <1.0)

## 2023-12-05 LAB — I-STAT CG4 LACTIC ACID, ED
Lactic Acid, Venous: 0.5 mmol/L (ref 0.5–1.9)
Lactic Acid, Venous: 0.3 mmol/L — ABNORMAL LOW (ref 0.5–1.9)

## 2023-12-05 LAB — APTT: aPTT: 32 s (ref 24–36)

## 2023-12-05 LAB — BRAIN NATRIURETIC PEPTIDE: B Natriuretic Peptide: 391.2 pg/mL — ABNORMAL HIGH (ref 0.0–100.0)

## 2023-12-05 MED ORDER — ROSUVASTATIN CALCIUM 5 MG PO TABS
5.0000 mg | ORAL_TABLET | Freq: Every day | ORAL | Status: DC
  Administered 2023-12-05 – 2023-12-07 (×3): 5 mg via ORAL
  Filled 2023-12-05 (×3): qty 1

## 2023-12-05 MED ORDER — DONEPEZIL HCL 5 MG PO TABS
5.0000 mg | ORAL_TABLET | Freq: Every day | ORAL | Status: DC
  Administered 2023-12-05 – 2023-12-07 (×3): 5 mg via ORAL
  Filled 2023-12-05 (×3): qty 1

## 2023-12-05 MED ORDER — BUDESONIDE 0.5 MG/2ML IN SUSP
0.5000 mg | Freq: Two times a day (BID) | RESPIRATORY_TRACT | Status: DC
  Administered 2023-12-05 – 2023-12-08 (×6): 0.5 mg via RESPIRATORY_TRACT
  Filled 2023-12-05 (×6): qty 2

## 2023-12-05 MED ORDER — APIXABAN 5 MG PO TABS
5.0000 mg | ORAL_TABLET | Freq: Two times a day (BID) | ORAL | Status: DC
  Administered 2023-12-05 – 2023-12-08 (×6): 5 mg via ORAL
  Filled 2023-12-05 (×6): qty 1

## 2023-12-05 MED ORDER — FUROSEMIDE 10 MG/ML IJ SOLN
40.0000 mg | Freq: Once | INTRAMUSCULAR | Status: AC
  Administered 2023-12-05: 40 mg via INTRAVENOUS
  Filled 2023-12-05: qty 4

## 2023-12-05 MED ORDER — ALBUTEROL SULFATE (2.5 MG/3ML) 0.083% IN NEBU
2.5000 mg | INHALATION_SOLUTION | RESPIRATORY_TRACT | Status: DC | PRN
Start: 2023-12-05 — End: 2023-12-08

## 2023-12-05 MED ORDER — ALBUTEROL SULFATE (2.5 MG/3ML) 0.083% IN NEBU
2.5000 mg | INHALATION_SOLUTION | Freq: Four times a day (QID) | RESPIRATORY_TRACT | Status: DC
  Administered 2023-12-05 – 2023-12-07 (×9): 2.5 mg via RESPIRATORY_TRACT
  Filled 2023-12-05 (×10): qty 3

## 2023-12-05 MED ORDER — POLYVINYL ALCOHOL 1.4 % OP SOLN: 1.0000 [drp] | Freq: Four times a day (QID) | OPHTHALMIC | Status: DC | PRN

## 2023-12-05 MED ORDER — ARFORMOTEROL TARTRATE 15 MCG/2ML IN NEBU
15.0000 ug | INHALATION_SOLUTION | Freq: Two times a day (BID) | RESPIRATORY_TRACT | Status: DC
  Administered 2023-12-05 – 2023-12-08 (×6): 15 ug via RESPIRATORY_TRACT
  Filled 2023-12-05 (×6): qty 2

## 2023-12-05 MED ORDER — SERTRALINE HCL 25 MG PO TABS
25.0000 mg | ORAL_TABLET | Freq: Every day | ORAL | Status: DC
  Administered 2023-12-05 – 2023-12-08 (×4): 25 mg via ORAL
  Filled 2023-12-05 (×4): qty 1

## 2023-12-05 MED ORDER — SENNOSIDES-DOCUSATE SODIUM 8.6-50 MG PO TABS
1.0000 | ORAL_TABLET | Freq: Two times a day (BID) | ORAL | Status: DC
  Administered 2023-12-05 – 2023-12-08 (×6): 1 via ORAL
  Filled 2023-12-05 (×6): qty 1

## 2023-12-05 MED ORDER — ACETAMINOPHEN 650 MG RE SUPP
650.0000 mg | Freq: Four times a day (QID) | RECTAL | Status: DC | PRN
Start: 2023-12-05 — End: 2023-12-08

## 2023-12-05 MED ORDER — LORATADINE 10 MG PO TABS
10.0000 mg | ORAL_TABLET | Freq: Every day | ORAL | Status: DC
  Administered 2023-12-05 – 2023-12-08 (×4): 10 mg via ORAL
  Filled 2023-12-05 (×4): qty 1

## 2023-12-05 MED ORDER — FUROSEMIDE 10 MG/ML IJ SOLN
40.0000 mg | Freq: Two times a day (BID) | INTRAMUSCULAR | Status: DC
  Administered 2023-12-05: 40 mg via INTRAVENOUS
  Filled 2023-12-05: qty 4

## 2023-12-05 MED ORDER — VITAMIN B-12 1000 MCG PO TABS
1000.0000 ug | ORAL_TABLET | Freq: Every day | ORAL | Status: DC
  Administered 2023-12-05 – 2023-12-08 (×4): 1000 ug via ORAL
  Filled 2023-12-05 (×3): qty 1

## 2023-12-05 MED ORDER — ACETAMINOPHEN 325 MG PO TABS
650.0000 mg | ORAL_TABLET | Freq: Four times a day (QID) | ORAL | Status: DC | PRN
  Administered 2023-12-06: 650 mg via ORAL
  Filled 2023-12-05: qty 2

## 2023-12-05 MED ORDER — LOSARTAN POTASSIUM 50 MG PO TABS
100.0000 mg | ORAL_TABLET | Freq: Every day | ORAL | Status: DC
  Administered 2023-12-05: 100 mg via ORAL
  Filled 2023-12-05: qty 2

## 2023-12-05 MED ORDER — SODIUM CHLORIDE 0.9 % IV BOLUS
500.0000 mL | Freq: Once | INTRAVENOUS | Status: AC
  Administered 2023-12-05: 500 mL via INTRAVENOUS

## 2023-12-05 MED ORDER — SODIUM CHLORIDE 0.9% FLUSH
3.0000 mL | Freq: Two times a day (BID) | INTRAVENOUS | Status: DC
  Administered 2023-12-05 – 2023-12-08 (×7): 3 mL via INTRAVENOUS

## 2023-12-05 MED ORDER — GABAPENTIN 100 MG PO CAPS
200.0000 mg | ORAL_CAPSULE | Freq: Every day | ORAL | Status: DC
  Administered 2023-12-05: 200 mg via ORAL
  Filled 2023-12-05: qty 2

## 2023-12-05 MED ORDER — FLUTICASONE PROPIONATE 50 MCG/ACT NA SUSP
1.0000 | Freq: Two times a day (BID) | NASAL | Status: DC
  Administered 2023-12-06 – 2023-12-08 (×5): 1 via NASAL
  Filled 2023-12-05 (×2): qty 16

## 2023-12-05 MED ORDER — ENOXAPARIN SODIUM 40 MG/0.4ML IJ SOSY
40.0000 mg | PREFILLED_SYRINGE | INTRAMUSCULAR | Status: DC
  Administered 2023-12-05: 40 mg via SUBCUTANEOUS
  Filled 2023-12-05: qty 0.4

## 2023-12-05 MED ORDER — SODIUM CHLORIDE 0.9 % IV SOLN
2.0000 g | INTRAVENOUS | Status: AC
  Administered 2023-12-05 – 2023-12-07 (×3): 2 g via INTRAVENOUS
  Filled 2023-12-05 (×3): qty 20

## 2023-12-05 MED ORDER — SODIUM CHLORIDE 0.9 % IV SOLN
2.0000 g | Freq: Once | INTRAVENOUS | Status: AC
  Administered 2023-12-05: 2 g via INTRAVENOUS
  Filled 2023-12-05: qty 20

## 2023-12-05 MED ORDER — DICLOFENAC SODIUM 1 % EX GEL
4.0000 g | Freq: Four times a day (QID) | CUTANEOUS | Status: DC
  Administered 2023-12-05 – 2023-12-08 (×10): 4 g via TOPICAL
  Filled 2023-12-05: qty 100

## 2023-12-05 MED ORDER — EMPAGLIFLOZIN 10 MG PO TABS
10.0000 mg | ORAL_TABLET | Freq: Every day | ORAL | Status: DC
  Filled 2023-12-05: qty 1

## 2023-12-05 MED ORDER — PANTOPRAZOLE SODIUM 40 MG PO TBEC
40.0000 mg | DELAYED_RELEASE_TABLET | Freq: Every day | ORAL | Status: DC
  Administered 2023-12-05 – 2023-12-08 (×4): 40 mg via ORAL
  Filled 2023-12-05 (×4): qty 1

## 2023-12-05 MED ORDER — MELATONIN 3 MG PO TABS
6.0000 mg | ORAL_TABLET | Freq: Every day | ORAL | Status: DC
  Administered 2023-12-05 – 2023-12-07 (×3): 6 mg via ORAL
  Filled 2023-12-05 (×3): qty 2

## 2023-12-05 NOTE — Progress Notes (Signed)
 Heart Failure Navigator Progress Note  Assessed for Heart & Vascular TOC clinic readiness.  Patient does not meet criteria due to EF 55-60%, admitted for Somnolence and Hypercarbia. . Has a scheduled Pulmonary appointment on 12/17/2023. No HF TOC.   Navigator will sign off at this time.   Stephane Haddock, BSN, Scientist, clinical (histocompatibility and immunogenetics) Only

## 2023-12-05 NOTE — ED Notes (Signed)
 Bair hugger placed on pt at this time.

## 2023-12-05 NOTE — Progress Notes (Signed)
   12/05/23 0150  Oxygen  Therapy/Pulse Ox  O2 Device HHFNC  $ High Flow Nasal POS Pressure Daily Yes  Heated High Flow Nasal Cannula  Yes  Heated High Flow Nasal Cannula  Adult Large  $ Adult Large Yes  O2 Therapy Oxygen  humidified  Heater temperature 87.8 F (31 C)  O2 Flow Rate (L/min) 60 L/min  FiO2 (%) 74 %   Placed pt on HHFNC per provider order unable to place pt on bipap due to alerted mental status

## 2023-12-05 NOTE — Progress Notes (Signed)
 Pt arrived from ED via bed.  Pt grossly incontinent of a very large amount of urine and a smear of stool.  Incontinent care provided.  Bilat buttock stage II pressure sores present on arrival.  Foam sacral dressing placed.  Abdominal folds intact, but very moist.  Folds washed with soap and water, pat dry, and antifungal powder applied.  Requested that oncoming nurse/charge nurse take pictures of the buttocks for the chart.  Message to MD about using a purewick.  New order from MD to place purewick rcd.  Pt was previoously positive for MRSA in 08/2023.  Repeat swab done by this RN and sent to the lab.  Fall and DNR band placed to pt's left wrist.  Pt answered all orientation questions correctly on arrival to unit.  On 4L , continuous 02 monitoring.  Bedside shift report done with oncoming RN.  Pt sitting up eating dinner, no distress noted.  Call bell within reach and bed alarm on for safety

## 2023-12-05 NOTE — ED Provider Notes (Signed)
 [place] EMERGENCY DEPARTMENT AT [facility] Provider Note  CSN: [id number] Arrival date & time: 12/05/23 0033  Chief Complaint(s) Altered Mental Status (Per facility pt is normally alert and oriented, started today pt is AMS )  HPI Elizabeth [name] is a 83 y.o. female with a past medical history listed below here for somnolence.  Patient recently been treated for urinary tract infection.  Patient is extremely somnolent and unable to provide history.  She is a DNR/DNI.  Level 5 caveat  HPI  Past Medical History Past Medical History:  Diagnosis Date   (HFpEF) heart failure with preserved ejection fraction (HCC) 12/24/2022   Arthritis    Asthma    Breast mass in female    Bronchitis    Bronchitis    Constipation    Cough    GERD (gastroesophageal reflux disease)    Hypertension associated with diabetes (HCC)    Type 2 diabetes mellitus (HCC)    Wheezing    Patient Active Problem List   Diagnosis Date Noted   Acute respiratory failure with hypercapnia (HCC) 09/17/2023   Hypomagnesemia 09/15/2023   Hypophosphatemia 09/15/2023   Acute metabolic encephalopathy 09/14/2023   Reactive airway disease with acute exacerbation 09/14/2023   Acute on chronic diastolic CHF (congestive heart failure) (HCC) 09/14/2023   Colitis 04/24/2023   Sepsis (HCC) 04/23/2023   (HFpEF) heart failure with preserved ejection fraction (HCC) 12/24/2022   Morbid obesity (HCC) 12/24/2022   OSA (obstructive sleep apnea) 03/28/2022   Diabetes mellitus without complication (HCC) 06/19/2021   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 Authorizing Provider  acetaminophen  (TYLENOL ) 500 MG tablet Take 1,000 mg by mouth in the morning and at bedtime.    [provider]  albuterol  (PROVENTIL  HFA;VENTOLIN  HFA) 108 (90 BASE) MCG/ACT inhaler Inhale 2 puffs into the lungs every 6 (six) hours as needed for shortness of breath.    [provider]  albuterol  (PROVENTIL ) (2.5 MG/3ML) 0.083% nebulizer solution Take 2.5 mg by nebulization every 6 (six) hours as needed for wheezing or shortness of breath.    [provider]  apixaban  (ELIQUIS ) 5 MG TABS tablet Take 1 tablet (5 mg total) by mouth 2 (two) times daily. 04/28/23   Amin, Ankit C, MD  bisacodyl  (DULCOLAX) 5 MG EC tablet Take 2 tablets (10 mg total) by mouth daily as needed for moderate constipation or severe constipation. 04/28/23   Amin, Ankit C, MD  budesonide -formoterol  (SYMBICORT) 160-4.5 MCG/ACT inhaler Inhale 2 puffs into the lungs 2 (two) times daily.    [provider]  carvedilol  (COREG ) 3.125 MG tablet Take 1 tablet (3.125 mg total) by mouth 2 (two) times daily. 09/18/23 09/17/24  Shalhoub, Zachary PARAS, MD  cephALEXin  (KEFLEX ) 500 MG capsule Take 1 capsule (500 mg total) by mouth 3 (three) times daily for 7 days. 11/29/23 12/06/23  Hildegard Loge, PA-C  cetirizine  (ZYRTEC ) 10 MG tablet Take 10 mg by mouth daily.    [provider]  cyanocobalamin  (VITAMIN B12) 1000 MCG tablet Take 1,000  mcg by mouth daily.    [provider]  diclofenac  Sodium (VOLTAREN ) 1 % GEL Apply 1 Application topically 4 (four) times daily. 04/24/21   [provider]  donepezil  (ARICEPT ) 5 MG tablet Take 1 tablet (5 mg total) by mouth daily. 07/30/23   Wertman, Sara E, PA-C  fluticasone  (FLONASE ) 50 MCG/ACT nasal spray PLACE 1 SPRAY INTO BOTH NOSTRILS 2 (TWO) TIMES DAILY 09/13/22   Shellia Oh, MD  furosemide  (LASIX ) 40  MG tablet Take 1 tablet (40 mg total) by mouth daily as needed. Weigh yourself daily.  Take one dose by mouth if your weight increases by more than 2 pounds in one day or 5 pounds in one week. 09/18/23 09/17/24  Shalhoub, Zachary PARAS, MD  gabapentin  (NEURONTIN ) 100 MG capsule Take 100 mg by mouth daily. Take 200 mg by mouth three times daily. Take 300 mg by mouth at bedtime. 03/27/23   [provider]  hydrALAZINE  (APRESOLINE ) 50 MG tablet Take 25 mg by mouth every 8 (eight) hours as needed (hypertension). 04/10/23   [provider]  insulin  aspart (NOVOLOG ) 100 UNIT/ML injection Inject 0-15 Units into the skin 4 (four) times daily -  before meals and at bedtime. 09/18/23   Shalhoub, Zachary PARAS, MD  JARDIANCE  10 MG TABS tablet Take 10 mg by mouth daily. 04/09/23   [provider]  lidocaine  (LIDODERM ) 5 % Place 1 patch onto the skin at bedtime. 03/13/23   [provider]  losartan  (COZAAR ) 100 MG tablet Take 100 mg by mouth daily.    [provider]  Menthol, Topical Analgesic, (BIOFREEZE COOL THE PAIN EX) Apply 1 application  topically in the morning and at bedtime. Apply to BL hands and ankles    [provider]  Multiple Vitamins-Minerals (CENTRUM SILVER PO) Take 1 tablet by mouth daily.    [provider]  oxybutynin  (DITROPAN -XL) 10 MG 24 hr tablet Take 10 mg by mouth daily. 12/09/22   [provider]  pantoprazole  (PROTONIX ) 40 MG tablet Take 40 mg by mouth daily.    [provider]  potassium chloride  (KLOR-CON ) 10 MEQ tablet Take 10 mEq by mouth daily.    [provider]  rosuvastatin  (CRESTOR ) 5 MG tablet Take 5 mg by mouth daily. 12/09/22   [provider]  senna (SENOKOT) 8.6 MG tablet Take 2 tablets by mouth daily.    [provider]  sertraline  (ZOLOFT ) 25 MG tablet Take 25 mg by mouth daily.    [provider]                                                                                                                                     Allergies Patient has no known allergies.  Review of Systems Review of Systems As noted in HPI  Physical Exam Vital Signs  I have reviewed the triage vital signs BP (!) 119/59   Pulse  70   Temp (!) 97.1 F (36.2 C) (Axillary)   Resp 14   Ht 5' 4 (1.626 m)   Wt 120 kg   SpO2 96%   BMI 45.41 kg/m   Physical Exam Vitals reviewed.  Constitutional:      General: She is not in acute distress.    Appearance: She is well-developed. She is morbidly obese. She is not diaphoretic.  HENT:     Head: Normocephalic and atraumatic.     Nose: Nose normal.  Eyes:     General: No scleral icterus.       Right eye: No discharge.        Left eye: No discharge.     Conjunctiva/sclera: Conjunctivae normal.     Pupils: Pupils are equal, round, and reactive to light.  Cardiovascular:     Rate and Rhythm: Normal rate and regular rhythm.     Heart sounds: No murmur heard.    No friction rub. No gallop.  Pulmonary:     Effort: Pulmonary effort is normal. No respiratory distress.     Breath sounds: Normal breath sounds. No stridor. No rales.  Abdominal:     General: There is no distension.     Palpations: Abdomen is soft.     Tenderness: There is no abdominal tenderness.  Musculoskeletal:        General: No tenderness.     Cervical back: Normal range of motion and neck supple.  Skin:    General: Skin is warm and dry.     Findings: No erythema or rash.  Neurological:     Mental Status: She is lethargic.     ED Results and Treatments Labs (all labs ordered are listed, but only abnormal results are displayed) Labs Reviewed  CBC WITH DIFFERENTIAL/PLATELET - Abnormal; Notable for the following components:      Result Value   WBC 3.9 (*)    RBC 3.15 (*)    Hemoglobin 9.4 (*)    HCT 34.6 (*)    MCV 109.8 (*)    MCHC 27.2 (*)    Lymphs Abs 0.5 (*)    All other components within normal limits  COMPREHENSIVE METABOLIC PANEL WITH  GFR - Abnormal; Notable for the following components:   Potassium 5.5 (*)    Chloride 93 (*)    CO2 34 (*)    Glucose, Bld 118 (*)    Creatinine, Ser 1.08 (*)    Total Protein 6.4 (*)    Albumin 3.0 (*)    AST 11 (*)    GFR, Estimated 51 (*)    All other components within normal limits  URINALYSIS, W/ REFLEX TO CULTURE (INFECTION SUSPECTED) - Abnormal; Notable for the following components:   Color, Urine AMBER (*)    APPearance CLOUDY (*)    Glucose, UA 50 (*)    Hgb urine dipstick MODERATE (*)    Protein, ur >=300 (*)    Leukocytes,Ua TRACE (*)    All other components within normal limits  I-STAT VENOUS BLOOD GAS, ED - Abnormal; Notable for the following components:   pCO2, Ven 73.9 (*)    pO2, Ven 97 (*)    Bicarbonate 39.6 (*)    TCO2 42 (*)    Acid-Base Excess 11.0 (*)    Potassium 5.4 (*)    HCT 32.0 (*)    Hemoglobin 10.9 (*)    All other components within normal limits  I-STAT CHEM 8, ED - Abnormal; Notable for the following components:  Potassium 5.5 (*)    Chloride 97 (*)    BUN 27 (*)    Creatinine, Ser 1.30 (*)    Glucose, Bld 118 (*)    Calcium , Ion 1.14 (*)    TCO2 37 (*)    Hemoglobin 10.9 (*)    HCT 32.0 (*)    All other components within normal limits  I-STAT VENOUS BLOOD GAS, ED - Abnormal; Notable for the following components:   pO2, Ven 170 (*)    Bicarbonate 38.3 (*)    TCO2 40 (*)    Acid-Base Excess 12.0 (*)    Calcium , Ion 1.12 (*)    HCT 28.0 (*)    Hemoglobin 9.5 (*)    All other components within normal limits  I-STAT CG4 LACTIC ACID, ED - Abnormal; Notable for the following components:   Lactic Acid, Venous 0.3 (*)    All other components within normal limits  CULTURE, BLOOD (ROUTINE X 2)  CULTURE, BLOOD (ROUTINE X 2)  PROTIME-INR  APTT  I-STAT CG4 LACTIC ACID, ED  CBG MONITORING, ED  I-STAT VENOUS BLOOD GAS, ED  I-STAT VENOUS BLOOD GAS, ED                                                                                                                          EKG  EKG Interpretation Date/Time:  Friday December 05 2023 00:56:04 EDT Ventricular Rate:  64 PR Interval:  152 QRS Duration:  141 QT Interval:  452 QTC Calculation: 467 R Axis:   -63  Text Interpretation: Sinus rhythm Atrial premature complex RBBB and LAFB Probable left ventricular hypertrophy Anterior Q waves, possibly due to LVH Confirmed by Trine Likes [phone]) on 12/05/2023 1:55:40 AM       Radiology DG Chest Port 1 View Result Date: 12/05/2023 CLINICAL DATA:  Hypercarbia EXAM: PORTABLE CHEST 1 VIEW COMPARISON:  11/29/2023 FINDINGS: Cardiac shadow is enlarged in size. Central vascular congestion is noted. Left-sided effusion is noted. No focal infiltrate is noted. No bony abnormality is seen. IMPRESSION: CHF with left-sided effusion. Electronically Signed   By: Oneil Devonshire M.D.   On: 12/05/2023 02:11    Medications Ordered in ED Medications  furosemide  (LASIX ) injection 40 mg (has no administration in time range)  sodium chloride  0.9 % bolus 500 mL (0 mLs Intravenous Stopped 12/05/23 0400)  cefTRIAXone  (ROCEPHIN ) 2 g in sodium chloride  0.9 % 100 mL IVPB (0 g Intravenous Stopped 12/05/23 0400)   Procedures .Critical Care  Performed by: Trine Likes Moder, MD Authorized by: Trine Likes Moder, MD   Critical care provider statement:    Critical care time (minutes):  45   Critical care time was exclusive of:  Separately billable procedures and treating other patients   Critical care was necessary to treat or prevent imminent or life-threatening deterioration of the following conditions:  Respiratory failure   Critical care was time spent personally by me on the following activities:  Development of treatment plan with patient  or surrogate, discussions with consultants, evaluation of patient's response to treatment, examination of patient, obtaining history from patient or surrogate, review of old charts, re-evaluation of patient's condition,  pulse oximetry, ordering and review of radiographic studies, ordering and review of laboratory studies and ordering and performing treatments and interventions   Care discussed with: admitting provider     (including critical care time) Medical Decision Making / ED Course   Medical Decision Making Amount and/or Complexity of Data Reviewed Labs: ordered. Decision-making details documented in ED Course. Radiology: ordered and independent interpretation performed. Decision-making details documented in ED Course. ECG/medicine tests: ordered and independent interpretation performed. Decision-making details documented in ED Course.  Risk Prescription drug management. Decision regarding hospitalization.    Somnolence differential diagnosis considered.  Workup below.  Will need to rule out sepsis related to possible urinary tract infection.  Empiric antibiotics given. CBC without leukocytosis.  UA not concerning for infection. Chest x-ray without evidence of pneumonia, pneumothorax.  No overt pulmonary edema but patient does have pulmonary vascular congestion with left pleural effusion. Lactic acid normal.  No hyponatremia or hypoglycemia contributing to somnolence. VBG was notable for hypercarbia likely cause of her somnolence.  She was placed on heated high flow nasal cannula.  On reassessment after 1 to 2 hours of being on heated high flow, patient's somnolence is significantly improving and patient is now more easily arousable and speaking.  Repeat VBG shows resolved hypercarbia.  On labs, additional findings include hyperkalemia 5.5 without EKG changes.  She was provided with small IV fluid bolus this was prior to this chest x-ray noted above.  IV Lasix  ordered.  Consulted hospitalist service and spoke with Dr. Sundil, who will admit.     Final Clinical Impression(s) / ED Diagnoses Final diagnoses:  Somnolence  Hypercarbia    This chart was dictated using voice recognition  software.  Despite best efforts to proofread,  errors can occur which can change the documentation meaning.    Trine Raynell Moder, MD 12/05/23 [id number]

## 2023-12-05 NOTE — Hospital Course (Signed)
 83 year old female past medical history of reactive airway disease, diastolic heart failure, chronic venous insufficiency, DM type II, essential hypertension, GERD, DM type II, memory impairment, obstructive sleep apnea, chronic hypercapnic respiratory failure, and history of previous hospital admission for metabolic encephalopathy presented for evaluation for altered mental status.   Patient reported that she has recent UTI and getting antibiotic.  At presentation to ED patient found borderline hypotensive and low temperature 94.  Blood pressure 90 pressure has been improved.  Also requiring high flow oxygen  97%. Lab work, CBC stable H&H normal WBC platelet count.  CMP showing elevated potassium 5.5, elevated creatinine 1.08, elevated bicarb 34.  Normal pro time INR.  UA showing evidence of UTI.  Pending urine culture.  Pending blood culture.  VBG showing elevated pCO2 73 normal pH and pO2 level.  Normal lactic acid level.  Chest x-ray showed evidence of CHF with left sided effusion.EKG showing normal sinus rhythm heart rate 64 and atrial premature complex.  In the ED patient received ceftriaxone , Lasix  and 500 mL of NS bolus.  Even though patient is retaining pCO2 due somnolence unable to place BiPAP in the ED.  Currently patient is on heated high flow and mental status has been improving.  Given chest x-ray showing pulmonary vascular congestion patient received IV Lasix .  Hospitalist has been consulted for further evaluation and management of altered mental status, acute on chronic hypercarbic respiratory failure, acute cystitis and AKI.

## 2023-12-05 NOTE — H&P (Signed)
 History and Physical    Patient: Elizabeth [name] FMW:[id number] DOB: April 21, 1941 DOA: 12/05/2023 DOS: the patient was seen and examined on 12/05/2023 PCP: Albina GORMAN Dine, MD  Patient coming from: Home  Chief Complaint:  Chief Complaint  Patient presents with   Altered Mental Status    Per facility pt is normally alert and oriented, started today pt is AMS    HPI: Elizabeth [name] is a 83 y.o. female with medical history significant of hypertension, HFpEF, chronic venous insufficiency, diabetes mellitus type 2, reactive airway disease, chronic hypercapnic respiratory failure, memory impairment, obstructive sleep apnea not compliant with CPAP, and GERD presents with lethargy and respiratory distress.  She is accompanied by her daughter who is present at bedside and gives most of the history.  She has been experiencing increased lethargy and respiratory distress. Her daughter notes that she has been more lethargic and unresponsive. She was previously treated for a urinary tract infection, but the medication did not seem effective as her symptoms persisted. The emergency department advised her daughter to bring her back to the hospital.  She has a history of congestive heart failure and uses a CPAP machine at night, although it has not been consistently used at her current residence. Her daughter is concerned about her respiratory status, as she has previously been hospitalized for elevated CO2 levels, which caused confusion and lethargy back in May. She is currently experiencing similar symptoms.  She has been constipated for five days, and her daughter reports that she was given a hand mixer to relieve the constipation. She has a history of bed sores, and her daughter is concerned about the care she is receiving at her current facility.  Recent lab work showed a white blood cell count of 3.9 and a hemoglobin level of 9.4. Urinalysis showed trace leukocytes and no bacteria, with 6-10 white blood  cells present.  She has been experiencing drooling and mumbling, which are new symptoms. Her daughter is worried about her overall condition and the adequacy of her care at the facility.  Plan admission into the emergency department patient was noted to have temperature 94.1 F, heart rates 58-74, blood pressures as well as 94/40 with improvement up to 167/71, and O2 saturations maintained on high flow nasal cannula oxygen .  Labs noted WBC 3.9, hemoglobin 9.4, MCV 109.8, potassium 5.5, CO2 34, BUN 23, creatinine 1.08, and lactic acid 0.5.  Initial venous blood gas noted pH of 7.337 with pCO2 of 73.9.  Patient was not thought to be a candidate for BiPAP due to somnolence and was placed on high flow nasal cannula oxygen  instead with improvement in mental status.  Repeat venous blood gas noted CO2 within normal limits.  urinalysis noted trace leukocytes with greater than 50 RBC/hpf and 6-10 WBCs.  Chest x-ray noted CHF with a left-sided effusion.  Blood cultures were obtained.  Patient had been started on empiric antibiotics of Rocephin .  Review of Systems: As mentioned in the history of present illness. All other systems reviewed and are negative. Past Medical History:  Diagnosis Date   (HFpEF) heart failure with preserved ejection fraction (HCC) 12/24/2022   Arthritis    Asthma    Breast mass in female    Bronchitis    Bronchitis    Constipation    Cough    GERD (gastroesophageal reflux disease)    Hypertension associated with diabetes (HCC)    Type 2 diabetes mellitus (HCC)    Wheezing    Past Surgical History:  Procedure Laterality Date   ABDOMINAL HYSTERECTOMY  1996   CYSTECTOMY  1980's   back of neck   THYROID  SURGERY  2001   Social History:  reports that she quit smoking about 59 years ago. Her smoking use included cigarettes. She has never used smokeless tobacco. She reports that she does not drink alcohol  and does not use drugs.  No Known Allergies  Family History  Problem  Relation Age of Onset   Diabetes Mother    Heart disease Mother    Cancer Maternal Uncle     Prior to Admission medications   Medication Sig Start Date End Date Taking? Authorizing Provider  acetaminophen  (TYLENOL ) 500 MG tablet Take 1,000 mg by mouth in the morning and at bedtime.    [provider]  albuterol  (PROVENTIL  HFA;VENTOLIN  HFA) 108 (90 BASE) MCG/ACT inhaler Inhale 2 puffs into the lungs every 6 (six) hours as needed for shortness of breath.    [provider]  albuterol  (PROVENTIL ) (2.5 MG/3ML) 0.083% nebulizer solution Take 2.5 mg by nebulization every 6 (six) hours as needed for wheezing or shortness of breath.    [provider]  apixaban  (ELIQUIS ) 5 MG TABS tablet Take 1 tablet (5 mg total) by mouth 2 (two) times daily. 04/28/23   Amin, Ankit C, MD  bisacodyl  (DULCOLAX) 5 MG EC tablet Take 2 tablets (10 mg total) by mouth daily as needed for moderate constipation or severe constipation. 04/28/23   Amin, Ankit C, MD  budesonide -formoterol  (SYMBICORT) 160-4.5 MCG/ACT inhaler Inhale 2 puffs into the lungs 2 (two) times daily.    [provider]  carvedilol  (COREG ) 3.125 MG tablet Take 1 tablet (3.125 mg total) by mouth 2 (two) times daily. 09/18/23 09/17/24  Shalhoub, Zachary PARAS, MD  cephALEXin  (KEFLEX ) 500 MG capsule Take 1 capsule (500 mg total) by mouth 3 (three) times daily for 7 days. 11/29/23 12/06/23  Hildegard Loge, PA-C  cetirizine  (ZYRTEC ) 10 MG tablet Take 10 mg by mouth daily.    [provider]  cyanocobalamin  (VITAMIN B12) 1000 MCG tablet Take 1,000 mcg by mouth daily.    [provider]  diclofenac  Sodium (VOLTAREN ) 1 % GEL Apply 1 Application topically 4 (four) times daily. 04/24/21   [provider]  donepezil  (ARICEPT ) 5 MG tablet Take 1 tablet (5 mg total) by mouth daily. 07/30/23   Wertman, Sara E, PA-C  fluticasone  (FLONASE ) 50 MCG/ACT nasal spray PLACE 1 SPRAY INTO BOTH NOSTRILS 2 (TWO) TIMES DAILY 09/13/22   Sood,  Vineet, MD  furosemide  (LASIX ) 40 MG tablet Take 1 tablet (40 mg total) by mouth daily as needed. Weigh yourself daily.  Take one dose by mouth if your weight increases by more than 2 pounds in one day or 5 pounds in one week. 09/18/23 09/17/24  Shalhoub, Zachary PARAS, MD  gabapentin  (NEURONTIN ) 100 MG capsule Take 100 mg by mouth daily. Take 200 mg by mouth three times daily. Take 300 mg by mouth at bedtime. 03/27/23   [provider]  hydrALAZINE  (APRESOLINE ) 50 MG tablet Take 25 mg by mouth every 8 (eight) hours as needed (hypertension). 04/10/23   [provider]  insulin  aspart (NOVOLOG ) 100 UNIT/ML injection Inject 0-15 Units into the skin 4 (four) times daily -  before meals and at bedtime. 09/18/23   Shalhoub, Zachary PARAS, MD  JARDIANCE  10 MG TABS tablet Take 10 mg by mouth daily. 04/09/23   [provider]  lidocaine  (LIDODERM ) 5 % Place 1 patch onto the  skin at bedtime. 03/13/23   [provider]  losartan  (COZAAR ) 100 MG tablet Take 100 mg by mouth daily.    [provider]  Menthol, Topical Analgesic, (BIOFREEZE COOL THE PAIN EX) Apply 1 application  topically in the morning and at bedtime. Apply to BL hands and ankles    [provider]  Multiple Vitamins-Minerals (CENTRUM SILVER PO) Take 1 tablet by mouth daily.    [provider]  oxybutynin  (DITROPAN -XL) 10 MG 24 hr tablet Take 10 mg by mouth daily. 12/09/22   [provider]  pantoprazole  (PROTONIX ) 40 MG tablet Take 40 mg by mouth daily.    [provider]  potassium chloride  (KLOR-CON ) 10 MEQ tablet Take 10 mEq by mouth daily.    [provider]  rosuvastatin  (CRESTOR ) 5 MG tablet Take 5 mg by mouth daily. 12/09/22   [provider]  senna (SENOKOT) 8.6 MG tablet Take 2 tablets by mouth daily.    [provider]  sertraline  (ZOLOFT ) 25 MG tablet Take 25 mg by mouth daily.    [provider]    Physical Exam: Vitals:    12/05/23 0650 12/05/23 0700 12/05/23 0715 12/05/23 0836  BP:  (!) 167/66 (!) 162/69   Pulse:  72 74 71  Resp:  18 18 16   Temp: 97.9 F (36.6 C)     TempSrc: Oral     SpO2:  100% 100% 100%  Weight:      Height:        Constitutional: Elderly female currently in no acute distress eyes: PERRL, lids and conjunctivae normal ENMT: Mucous membranes are moist.    Neck: normal, supple Respiratory: Decreased overall aeration with some crackles appreciated in the lower lung fields.  Currently on high flow nasal cannula oxygen . Cardiovascular: Regular rate and rhythm, no murmurs / rubs / gallops.  +1 pitting lower extremity edema. Abdomen: no tenderness, no masses palpated.   Bowel sounds positive.  Musculoskeletal: no clubbing / cyanosis. No joint deformity upper and lower extremities. Good ROM, no contractures. Normal muscle tone.  Skin: no rashes, lesions, ulcers. No induration Neurologic: CN 2-12 grossly intact.  Able to move all extremities Psychiatric:    Lethargic and oriented to person, but not circumstances  Data Reviewed:  Sinus rhythm at 63 bpm with a RBBB and LAFB.  Reviewed labs, imaging, and pertinent records as documented.  Assessment and Plan:  Acute metabolic encephalopathy Patient was noted to be acutely altered somnolent.  Possibly multifactorial in the setting of hypercapnia as well as possible infection - Admit to a progressive bed - Delirium precautions  Respiratory failure with hypercapnia and hypoxia Reactive airway disease Acute on chronic.  Patient noted was noted to be hypercapnic with initial PCO2 73.9 on venous blood gas.  She was placed on high flow nasal cannula instead of BiPAP due to her somnolence. Despite this had improvement with repeat venous blood gas showing improved pCO2 to within normal limits. - Continuous pulse oximetry with oxygen  to maintain O2 saturations greater than 90%. - Scheduled breathing treatments - Continue CPAP at  night  SIRS/sepsis Urinary tract infection Patient presented with hypothermia and leukopenia meeting SIRS criteria.    On urinalysis noted trace leukocytes with greater than 50 RBC/hpf, and 6-10 WBCs.  Blood cultures were obtained.  Lactic acid was noted to be reassuring at 0.5.  Patient had initially been given empiric antibiotics with Rocephin .  Patient was being treated for a urinary tract infection with plans to be  on Keflex  from 8/10 through 8/17. - Follow-up blood cultures - Add on procalcitonin and CRP - Continue Rocephin   Heart failure with preserved ejection fraction Acute on chronic.  On physical exam patient has mild lower extremity swelling.  Chest x-ray noted signs of CHF with left-sided pleural effusion. Last echocardiogram noted EF to be 55 to 60% with grade 1 diastolic dysfunction when checked on 09/15/2023.  Patient had been given Lasix  40 mg IV. - Strict I&O's and daily weights - Add on BNP(BNP 391.2) - Give additional dose of Lasix  40 mg IV.  Reassess need for further diuresis in a.m.  Renal insufficiency Acute.  On admission creatinine noted to be 1.08 with BUN 23.  Baseline creatinine previously noted to be around 0.5-0.9. - Continue to monitor kidney function  Macrocytic anemia Chronic.  Hemoglobin noted to be 9.4 which appears slightly improved from when discharged from previous hospitalization on 8/10 of 8.8. - Recheck CBC tomorrow morning  Paroxysmal atrial fibrillation on chronic anticoagulation Patient appears to be in a sinus rhythm at this time. - Continue Eliquis   Controlled diabetes mellitus type 2 Last available hemoglobin A1c noted to be 4.5 when checked on 09/15/2023. - Continue Jardiance   Essential hypertension Blood pressures were initially noted to be low 94/44 which patient was bolused 500 mL of IV fluids initially. - Held home blood pressure regimen.  Reassess and determine when medically appropriate to resume  Mild memory impairment - Delirium  precautions - Continue Aricept   GERD - Continue PPI  OSA Patient's daughter notes that she had not been routinely being placed on CPAP at night at the facility as she was supposed to which is thought to have led to her being hospitalized - Continue CPAP nightly  Constipation Patient last had a bowel movement yesterday but has been dealing with constipation for some time. - Senokot-S twice daily.  De-escalate when medically appropriate  Morbid obesity BMI 45.41 kg/m   DVT prophylaxis: Eliquis  Advance Care Planning:   Code Status: Limited: Do not attempt resuscitation (DNR) -DNR-LIMITED -Do Not Intubate/DNI    Consults: None  Family Communication: Daughter updated at bedside  Severity of Illness: The appropriate patient status for this patient is INPATIENT. Inpatient status is judged to be reasonable and necessary in order to provide the required intensity of service to ensure the patient's safety. The patient's presenting symptoms, physical exam findings, and initial radiographic and laboratory data in the context of their chronic comorbidities is felt to place them at high risk for further clinical deterioration. Furthermore, it is not anticipated that the patient will be medically stable for discharge from the hospital within 2 midnights of admission.   * I certify that at the point of admission it is my clinical judgment that the patient will require inpatient hospital care spanning beyond 2 midnights from the point of admission due to high intensity of service, high risk for further deterioration and high frequency of surveillance required.*  Author: Maximino DELENA Sharps, MD 12/05/2023 8:42 AM  For on call review [email or web address].

## 2023-12-05 NOTE — ED Triage Notes (Signed)
 Chief Complaint  Patient presents with   Altered Mental Status    Per facility pt is normally alert and oriented, started today pt is AMS    Pt brought in by EMS pt was here last week for UTI and currently getting antibiotics, EMS reports that End Tital C02 was 99  Pt didn't receive any of her meds today   Past Medical History:  Diagnosis Date   (HFpEF) heart failure with preserved ejection fraction (HCC) 12/24/2022   Arthritis    Asthma    Breast mass in female    Bronchitis    Bronchitis    Constipation    Cough    GERD (gastroesophageal reflux disease)    Hypertension associated with diabetes (HCC)    Type 2 diabetes mellitus (HCC)    Wheezing     BP 132/68   Pulse 68   Temp (!) 94.1 F (34.5 C) (Rectal)   Resp 10   Ht 5' 4 (1.626 m)   Wt 120 kg   SpO2 98%   BMI 45.41 kg/m   Rosalee Ozell Journey

## 2023-12-05 NOTE — Progress Notes (Signed)
   12/05/23 2000  BiPAP/CPAP/SIPAP  Reason BIPAP/CPAP not in use Other(comment) (Pt states she only wants to wear her [place] tonight.)  BiPAP/CPAP /SiPAP Vitals  Pulse Rate 69  Resp 20  SpO2 (!) 85 %  MEWS Score/Color  MEWS Score 0  MEWS Score Color Green

## 2023-12-05 NOTE — Progress Notes (Signed)
 CSW spoke with Donnamarie at [facility] who confirms patient is from the facility as long term care and that she can return once medically stable for discharge.  Niels Portugal, MSW, LCSW Transitions of Care  Clinical Social Worker II [phone]

## 2023-12-06 ENCOUNTER — Inpatient Hospital Stay (HOSPITAL_COMMUNITY)

## 2023-12-06 DIAGNOSIS — Z515 Encounter for palliative care: Secondary | ICD-10-CM

## 2023-12-06 DIAGNOSIS — Z7189 Other specified counseling: Secondary | ICD-10-CM

## 2023-12-06 DIAGNOSIS — R918 Other nonspecific abnormal finding of lung field: Secondary | ICD-10-CM | POA: Diagnosis not present

## 2023-12-06 DIAGNOSIS — R0602 Shortness of breath: Secondary | ICD-10-CM | POA: Diagnosis not present

## 2023-12-06 DIAGNOSIS — G9341 Metabolic encephalopathy: Secondary | ICD-10-CM | POA: Diagnosis not present

## 2023-12-06 DIAGNOSIS — J811 Chronic pulmonary edema: Secondary | ICD-10-CM | POA: Diagnosis not present

## 2023-12-06 LAB — BASIC METABOLIC PANEL WITH GFR
Anion gap: 9 (ref 5–15)
BUN: 28 mg/dL — ABNORMAL HIGH (ref 8–23)
CO2: 37 mmol/L — ABNORMAL HIGH (ref 22–32)
Calcium: 8.8 mg/dL — ABNORMAL LOW (ref 8.9–10.3)
Chloride: 91 mmol/L — ABNORMAL LOW (ref 98–111)
Creatinine, Ser: 0.92 mg/dL (ref 0.44–1.00)
GFR, Estimated: >60 mL/min (ref >60)
Glucose, Bld: 113 mg/dL — ABNORMAL HIGH (ref 70–99)
Potassium: 4.7 mmol/L (ref 3.5–5.1)
Sodium: 137 mmol/L (ref 135–145)

## 2023-12-06 LAB — CBC
HCT: 29.8 % — ABNORMAL LOW (ref 36.0–46.0)
Hemoglobin: 8.6 g/dL — ABNORMAL LOW (ref 12.0–15.0)
MCH: 29.8 pg (ref 26.0–34.0)
MCHC: 28.9 g/dL — ABNORMAL LOW (ref 30.0–36.0)
MCV: 103.1 fL — ABNORMAL HIGH (ref 80.0–100.0)
Platelets: 162 K/uL (ref 150–400)
RBC: 2.89 MIL/uL — ABNORMAL LOW (ref 3.87–5.11)
RDW: 14.2 % (ref 11.5–15.5)
WBC: 6.2 K/uL (ref 4.0–10.5)
nRBC: 0 % (ref 0.0–0.2)

## 2023-12-06 LAB — CULTURE, BLOOD (ROUTINE X 2)
Culture: NO GROWTH
Culture: NO GROWTH
Special Requests: ADEQUATE

## 2023-12-06 LAB — PROCALCITONIN: Procalcitonin: <0.1 ng/mL

## 2023-12-06 LAB — TYPE AND SCREEN
ABO/RH(D): A POS
Antibody Screen: NEGATIVE

## 2023-12-06 LAB — MAGNESIUM: Magnesium: 1.6 mg/dL — ABNORMAL LOW (ref 1.7–2.4)

## 2023-12-06 LAB — C-REACTIVE PROTEIN: CRP: 1.5 mg/dL — ABNORMAL HIGH (ref <1.0)

## 2023-12-06 MED ORDER — HYDRALAZINE HCL 20 MG/ML IJ SOLN
10.0000 mg | Freq: Four times a day (QID) | INTRAMUSCULAR | Status: DC | PRN
  Administered 2023-12-07: 10 mg via INTRAVENOUS
  Filled 2023-12-06: qty 1

## 2023-12-06 MED ORDER — POLYETHYLENE GLYCOL 3350 17 G PO PACK
17.0000 g | PACK | Freq: Once | ORAL | Status: AC
  Administered 2023-12-06: 17 g via ORAL
  Filled 2023-12-06: qty 1

## 2023-12-06 MED ORDER — AMLODIPINE BESYLATE 10 MG PO TABS
10.0000 mg | ORAL_TABLET | Freq: Every day | ORAL | Status: DC
  Administered 2023-12-06 – 2023-12-08 (×3): 10 mg via ORAL
  Filled 2023-12-06 (×3): qty 1

## 2023-12-06 NOTE — Evaluation (Signed)
 Physical Therapy Evaluation Patient Details Name: Elizabeth [name] MRN: [id number] DOB: 07/07/1940 Today's Date: 12/06/2023  History of Present Illness  Patient is a 83 yo female admitted on 12/05/23 for lethargy and respiratory distress. Pt was found to have acute metabolic encephalopathy. PMH significant for heart failure, arthritis, asthma, GERD, HTN, DM II, PAF, HLD, OSA, CKD, and memory impairment.  Clinical Impression  Prior to admission, patient lives at [facility]. She reports functional decline since admission ~1 year ago. Since most recent fall, requires hoyer lift for transfers however reports she has been participating in therapy for exercises. Patient presents today with fear of falling, < 3/5 MMT strength B LE, poor activity tolerance, impaired seated balance. Patient tolerated ~12 minutes at EOB with min/CGA for stability, tendency for R lateral trunk lean. Able to participate in STS transfer x2 with maxA, using 2WW for support. Returned to supine end of session, patient's daughter present. Assisted toward [facility] with maxA, pillow placed under L hip for offloading. Patient will continue to benefit from skilled acute PT services to address the above impairments. Recommend return to post-acute rehab < 3 hours/day.         If plan is discharge home, recommend the following: A lot of help with bathing/dressing/bathroom;Two people to help with walking and/or transfers;Direct supervision/assist for medications management;Assist for transportation   Can travel by private vehicle   No    Equipment Recommendations None recommended by PT;Other (comment) (Patient reports having w/c and 2WW at facility.)  Recommendations for Other Services       Functional Status Assessment Patient has had a recent decline in their functional status and demonstrates the ability to make significant improvements in function in a reasonable and predictable amount of time.     Precautions / Restrictions  Precautions Precautions: Fall Recall of Precautions/Restrictions: Intact Restrictions Weight Bearing Restrictions Per Provider Order: No      Mobility  Bed Mobility Overal bed mobility: Needs Assistance Bed Mobility: Supine to Sit, Sit to Supine     Supine to sit: HOB elevated, Used rails, Max assist Sit to supine: Mod assist   General bed mobility comments: Supine to sit with modA for LE management, maxA for hip translation and modA for trunk righting. Sit to supine with modA at B LE.    Transfers Overall transfer level: Needs assistance Equipment used: Rolling walker (2 wheels) Transfers: Sit to/from Stand Sit to Stand: Max assist           General transfer comment: maxA for STS transfer x2. Raised bed height and knee blocking. Unable to stand fully upright.    Ambulation/Gait               General Gait Details: unable  Stairs            Wheelchair Mobility     Tilt Bed    Modified Rankin (Stroke Patients Only)       Balance Overall balance assessment: Needs assistance Sitting-balance support: Bilateral upper extremity supported Sitting balance-Leahy Scale: Poor Sitting balance - Comments: Difficulty avoiding R lateral trunk lean but able to correct with verbal/tactile cues. Postural control: Right lateral lean   Standing balance-Leahy Scale: Poor Standing balance comment: maxA for stability in standing position with B UE on 2WW. < 10 seconds maintained.                             Pertinent Vitals/Pain Pain Assessment Pain Assessment: 0-10 Pain  Score: 2  Pain Location: peri-area Pain Descriptors / Indicators: Discomfort Pain Intervention(s): Repositioned, Monitored during session, Other (comment) (RN notified, discomfort at [facility])    Home Living Family/patient expects to be discharged to:: Skilled nursing facility                   Additional Comments: [facility]    Prior Function Prior Level of Function  : Needs assist       Physical Assist : Mobility (physical);ADLs (physical) Mobility (physical): Bed mobility;Transfers ADLs (physical): Bathing;Dressing;Toileting;Grooming Mobility Comments: Patient reports she was admitted to [facility] August of 2024. She was able to ambulate to/from bathroom initially however since frequent hospital admission has been unable to ambulate. Previously able to transfer to/from chair with +1-2 assist however since most recent fall requiring hoyer lift transfers. ADLs Comments: assist for bathing/dressing. Has not been transferring onto commode.     Extremity/Trunk Assessment        Lower Extremity Assessment Lower Extremity Assessment: RLE deficits/detail;LLE deficits/detail RLE Deficits / Details: grossly 3-/5 MMT LLE Deficits / Details: grossly 3-/5 MMT       Communication   Communication Communication: No apparent difficulties    Cognition Arousal: Alert Behavior During Therapy: Anxious   PT - Cognitive impairments: No apparent impairments                         Following commands: Intact       Cueing Cueing Techniques: Verbal cues, Gestural cues, Tactile cues     General Comments General comments (skin integrity, edema, etc.): Seated EOB activity for ~12 minutes with participation in postural exercises.    Exercises     Assessment/Plan    PT Assessment Patient needs continued PT services  PT Problem List Decreased strength;Decreased activity tolerance;Decreased safety awareness;Decreased balance;Decreased mobility;Cardiopulmonary status limiting activity;Decreased knowledge of precautions       PT Treatment Interventions DME instruction;Gait training;Neuromuscular re-education;Patient/family education;Balance training;Therapeutic exercise;Therapeutic activities;Functional mobility training;Wheelchair mobility training    PT Goals (Current goals can be found in the Care Plan section)  Acute Rehab PT Goals Patient Stated  Goal: to get back to walking PT Goal Formulation: With patient/family Time For Goal Achievement: 12/20/23 Potential to Achieve Goals: Good    Frequency Min 1X/week     Co-evaluation               AM-PAC PT 6 Clicks Mobility  Outcome Measure Help needed turning from your back to your side while in a flat bed without using bedrails?: A Lot Help needed moving from lying on your back to sitting on the side of a flat bed without using bedrails?: A Lot Help needed moving to and from a bed to a chair (including a wheelchair)?: A Lot Help needed standing up from a chair using your arms (e.g., wheelchair or bedside chair)?: A Lot Help needed to walk in hospital room?: Total Help needed climbing 3-5 steps with a railing? : Total 6 Click Score: 10    End of Session   Activity Tolerance: Patient tolerated treatment well Patient left: in bed;with family/visitor present;with call bell/phone within reach;with bed alarm set Nurse Communication: Mobility status PT Visit Diagnosis: Unsteadiness on feet (R26.81);Difficulty in walking, not elsewhere classified (R26.2);Other abnormalities of gait and mobility (R26.89);Repeated falls (R29.6);Muscle weakness (generalized) (M62.81)    Time: 1019-1100 PT Time Calculation (min) (ACUTE ONLY): 41 min   Charges:   PT Evaluation $PT Eval Low Complexity: 1 Low PT Treatments $Therapeutic  Activity: 23-37 mins PT General Charges $$ ACUTE PT VISIT: 1 Visit         Sherryle [place], PT, DPT [facility] Acute Rehabilitation Office: [phone]   Sherryle VEAR [place] 12/06/2023, 12:30 PM

## 2023-12-06 NOTE — Progress Notes (Signed)
 PROGRESS NOTE                                                                                                                                                                                                             Patient Demographics:    Elizabeth [name], is a 83 y.o. female, DOB - 06/10/1940, FMW:[id number]  Outpatient Primary MD for the patient is Elizabeth GORMAN Dine, MD    LOS - 1  Admit date - 12/05/2023    Chief Complaint  Patient presents with   Altered Mental Status    Per facility pt is normally alert and oriented, started today pt is AMS        Brief Narrative (HPI from H&P)    83 y.o. female with medical history significant of hypertension, HFpEF, chronic venous insufficiency, diabetes mellitus type 2, reactive airway disease, chronic hypercapnic respiratory failure, memory impairment, obstructive sleep apnea not compliant with CPAP, and GERD presents with lethargy and respiratory distress.  She is accompanied by her daughter who is present at bedside and gives most of the history.   She has been experiencing increased lethargy and respiratory distress. Her daughter notes that she has been more lethargic and unresponsive. She was previously treated for a urinary tract infection, but the medication did not seem effective as her symptoms persisted. The emergency department advised her daughter to bring her back to the hospital.  In the ER she was diagnosed with a UTI, constipation, dehydration and admitted to the hospital.   Subjective:    Elizabeth [name] today has, No headache, No chest pain, No abdominal pain - No Nausea, No new weakness tingling or numbness, no SOB   Assessment  & Plan :    Acute metabolic encephalopathy due to sepsis from UTI present on admission along with mild hypercapnia.  She was adequately treated with initially BiPAP, IV antibiotics, no headache, no focal deficits, mentation close to  baseline, continue empiric antibiotic and follow cultures, nighttime CPAP continue, sepsis pathophysiology has resolved.  Monitor cultures.   Respiratory failure with hypercapnia and hypoxia - Reactive airway disease - Acute on chronic.  Patient noted was noted to be hypercapnic with initial PCO2 73.9 on venous blood gas. Continue CPAP at night while here and at SNF, clinically hypercapnia and hypoxia have resolved.    SIRS/sepsis - Urinary tract infection  kindly see above   Heart failure with preserved ejection fraction Acute on chronic.  On physical exam patient has mild lower extremity swelling.  Chest x-ray noted signs of CHF with left-sided pleural effusion. Last echocardiogram noted EF to be 55 to 60% with grade 1 diastolic dysfunction when checked on 09/15/2023.  Patient had been given Lasix  40 mg IV.  Since she has developed some AKI will dose Lasix  on a as needed basis from now on.  Acute renal insufficiency Acute.  On admission creatinine noted to be 1.08 with BUN 23.  Baseline creatinine previously noted to be around 0.5-0.9. Hold ARB, hold diuretics and monitor.   Macrocytic anemia Chronic.  Hemoglobin noted to be 9.4 which appears slightly improved from when discharged from previous hospitalization on 8/10 of 8.8.  Monitor.  Type screen also done.  Paroxysmal atrial fibrillation on chronic anticoagulation Patient appears to be in a sinus rhythm at this time. Continue Eliquis    Controlled diabetes mellitus type 2 Last available hemoglobin A1c noted to be 4.5 when checked on 09/15/2023.  Continue Jardiance    Essential hypertension Blood pressures were low with mild AKI, blood pressure regimen adjusted on 12/06/2023.   Mild memory impairment - Delirium precautions, Continue Aricept    GERD - Continue PPI   OSA - Patient's daughter notes that she had not been routinely being placed on CPAP at night at the facility as she was supposed to which is thought to have led to her being  hospitalized, Continue CPAP nightly   Constipation - Patient last had a bowel movement yesterday but has been dealing with constipation for some time. Senokot-S twice daily.   Monitor.  Morbid obesity -  BMI 45.41 kg/m, follow-up with PCP        Condition -  Guarded  Family Communication  :  daughter 89   Code Status :  DNR  Consults  :  None  PUD Prophylaxis :  PPI   Procedures  :            Disposition Plan  :    Status is: Inpatient  DVT Prophylaxis  :     apixaban  (ELIQUIS ) tablet 5 mg    Lab Results  Component Value Date   PLT 162 12/05/2023    Diet :  Diet Order             Diet Heart Room service appropriate? Yes; Fluid consistency: Thin  Diet effective now                    Inpatient Medications  Scheduled Meds:  albuterol   2.5 mg Nebulization QID   amLODipine   10 mg Oral Daily   apixaban   5 mg Oral BID   arformoterol   15 mcg Nebulization BID   budesonide  (PULMICORT ) nebulizer solution  0.5 mg Nebulization BID   cyanocobalamin   1,000 mcg Oral Daily   diclofenac  Sodium  4 g Topical QID   donepezil   5 mg Oral QHS   fluticasone   1 spray Each Nare BID   gabapentin   200 mg Oral QHS   loratadine   10 mg Oral Daily   melatonin  6 mg Oral QHS   pantoprazole   40 mg Oral Daily   rosuvastatin   5 mg Oral QHS   senna-docusate  1 tablet Oral BID   sertraline   25 mg Oral Daily   sodium chloride  flush  3 mL Intravenous Q12H   Continuous Infusions:  cefTRIAXone  (ROCEPHIN )  IV 2 g (12/05/23 2330)  PRN Meds:.acetaminophen  **OR** acetaminophen , albuterol , artificial tears, hydrALAZINE     Objective:   Vitals:   12/06/23 0755 12/06/23 0758 12/06/23 0800 12/06/23 0803  BP:    (!) 150/71  Pulse:    68  Resp:    19  Temp:    98.4 F (36.9 C)  TempSrc:    Oral  SpO2: 99% 99% 100% 100%  Weight:      Height:        Wt Readings from Last 3 Encounters:  12/06/23 123.2 kg  11/29/23 118.8 kg  09/18/23 118 kg     Intake/Output  Summary (Last 24 hours) at 12/06/2023 0909 Last data filed at 12/06/2023 0600 Gross per 24 hour  Intake 460 ml  Output 1400 ml  Net -940 ml     Physical Exam  Awake Alert, No new F.N deficits, Normal affect [place].AT,PERRAL Supple Neck, No JVD,   Symmetrical Chest wall movement, Good air movement bilaterally, CTAB RRR,No Gallops,Rubs or new Murmurs,  +ve B.Sounds, Abd Soft, No tenderness,   No Cyanosis, Clubbing or edema     Data Review:    Recent Labs  Lab 11/29/23 1841 11/29/23 1923 11/30/23 2206 12/05/23 0050 12/05/23 0058 12/05/23 0425  WBC 4.8  --  5.5 3.9*  --   --   HGB 10.0* 12.2 8.8* 9.4* 10.9*  10.9* 9.5*  HCT 36.9 36.0 31.9* 34.6* 32.0*  32.0* 28.0*  PLT 156  --  140* 162  --   --   MCV 109.8*  --  107.4* 109.8*  --   --   MCH 29.8  --  29.6 29.8  --   --   MCHC 27.1*  --  27.6* 27.2*  --   --   RDW 14.6  --  14.4 14.5  --   --   LYMPHSABS 0.9  --  1.0 0.5*  --   --   MONOABS 0.4  --  0.7 0.4  --   --   EOSABS 0.1  --  0.2 0.0  --   --   BASOSABS 0.0  --  0.0 0.0  --   --     Recent Labs  Lab 11/29/23 1841 11/29/23 1923 11/29/23 1924 11/29/23 2137 11/30/23 2206 12/05/23 0050 12/05/23 0058 12/05/23 0059 12/05/23 0424 12/05/23 0425 12/05/23 0925 12/05/23 0941 12/05/23 2132  NA 139 141  --   --  141 138 137  136  --   --  139  --   --   --   K 4.7 4.8  --   --  4.6 5.5* 5.5*  5.4*  --   --  5.1  --   --   --   CL 95* 96*  --   --  99 93* 97*  --   --   --   --   --   --   CO2 38*  --   --   --  36* 34*  --   --   --   --   --   --   --   ANIONGAP 6  --   --   --  6 11  --   --   --   --   --   --   --   GLUCOSE 108* 104*  --   --  104* 118* 118*  --   --   --   --   --   --   BUN 28* 29*  --   --  24* 23 27*  --   --   --   --   --   --   CREATININE 0.71 0.90  --   --  0.52 1.08* 1.30*  --   --   --   --   --   --   AST 12*  --   --   --   --  11*  --   --   --   --   --   --   --   ALT 10  --   --   --   --  11  --   --   --   --   --   --    --   ALKPHOS 47  --   --   --   --  44  --   --   --   --   --   --   --   BILITOT 0.5  --   --   --   --  0.3  --   --   --   --   --   --   --   ALBUMIN 3.2*  --   --   --   --  3.0*  --   --   --   --   --   --   --   CRP  --   --   --   --   --   --   --   --   --   --   --   --  1.7*  PROCALCITON  --   --   --   --   --   --   --   --   --   --   --  <0.10  --   LATICACIDVEN  --   --  0.5 0.4*  --   --   --  0.5 0.3*  --   --   --   --   INR 1.0  --   --   --   --  1.1  --   --   --   --   --   --   --   BNP  --   --   --   --   --   --   --   --   --   --  391.2*  --   --   CALCIUM  9.3  --   --   --  9.2 9.3  --   --   --   --   --   --   --       Recent Labs  Lab 11/29/23 1841 11/29/23 1924 11/29/23 2137 11/30/23 2206 12/05/23 0050 12/05/23 0059 12/05/23 0424 12/05/23 0925 12/05/23 0941 12/05/23 2132  CRP  --   --   --   --   --   --   --   --   --  1.7*  PROCALCITON  --   --   --   --   --   --   --   --  <0.10  --   LATICACIDVEN  --  0.5 0.4*  --   --  0.5 0.3*  --   --   --   INR 1.0  --   --   --  1.1  --   --   --   --   --  BNP  --   --   --   --   --   --   --  391.2*  --   --   CALCIUM  9.3  --   --  9.2 9.3  --   --   --   --   --     Lab Results  Component Value Date   HGBA1C 4.5 (L) 09/15/2023     Micro Results Recent Results (from the past 240 hours)  Resp panel by RT-PCR (RSV, Flu A&B, Covid) Anterior Nasal Swab     Status: None   Collection Time: 11/29/23  6:41 PM   Specimen: Anterior Nasal Swab  Result Value Ref Range Status   SARS Coronavirus 2 by RT PCR NEGATIVE NEGATIVE Final    Comment: (NOTE) SARS-CoV-2 target nucleic acids are NOT DETECTED.  The SARS-CoV-2 RNA is generally detectable in upper respiratory specimens during the acute phase of infection. The lowest concentration of SARS-CoV-2 viral copies this assay can detect is 138 copies/mL. A negative result does not preclude SARS-Cov-2 infection and should not be used as the sole basis  for treatment or other patient management decisions. A negative result may occur with  improper specimen collection/handling, submission of specimen other than nasopharyngeal swab, presence of viral mutation(s) within the areas targeted by this assay, and inadequate number of viral copies(<138 copies/mL). A negative result must be combined with clinical observations, patient history, and epidemiological information. The expected result is Negative.  Fact Sheet for Patients:  BloggerCourse.com  Fact Sheet for Healthcare Providers:  SeriousBroker.it  This test is no t yet approved or cleared by the [place]  FDA and  has been authorized for detection and/or diagnosis of SARS-CoV-2 by FDA under an Emergency Use Authorization (EUA). This EUA will remain  in effect (meaning this test can be used) for the duration of the COVID-19 declaration under Section 564(b)(1) of the Act, 21 U.S.C.section 360bbb-3(b)(1), unless the authorization is terminated  or revoked sooner.       Influenza A by PCR NEGATIVE NEGATIVE Final   Influenza B by PCR NEGATIVE NEGATIVE Final    Comment: (NOTE) The Xpert Xpress SARS-CoV-2/FLU/RSV plus assay is intended as an aid in the diagnosis of influenza from Nasopharyngeal swab specimens and should not be used as a sole basis for treatment. Nasal washings and aspirates are unacceptable for Xpert Xpress SARS-CoV-2/FLU/RSV testing.  Fact Sheet for Patients: BloggerCourse.com  Fact Sheet for Healthcare Providers: SeriousBroker.it  This test is not yet approved or cleared by the [place]  FDA and has been authorized for detection and/or diagnosis of SARS-CoV-2 by FDA under an Emergency Use Authorization (EUA). This EUA will remain in effect (meaning this test can be used) for the duration of the COVID-19 declaration under Section 564(b)(1) of the Act, 21  U.S.C. section 360bbb-3(b)(1), unless the authorization is terminated or revoked.     Resp Syncytial Virus by PCR NEGATIVE NEGATIVE Final    Comment: (NOTE) Fact Sheet for Patients: BloggerCourse.com  Fact Sheet for Healthcare Providers: SeriousBroker.it  This test is not yet approved or cleared by the [place]  FDA and has been authorized for detection and/or diagnosis of SARS-CoV-2 by FDA under an Emergency Use Authorization (EUA). This EUA will remain in effect (meaning this test can be used) for the duration of the COVID-19 declaration under Section 564(b)(1) of the Act, 21 U.S.C. section 360bbb-3(b)(1), unless the authorization is terminated or revoked.  Performed at [facility], [address]  [place], [place] [address]   Blood Culture (routine x 2)     Status: None   Collection Time: 11/29/23  6:41 PM   Specimen: BLOOD RIGHT ARM  Result Value Ref Range Status   Specimen Description   Final    BLOOD RIGHT ARM Performed at [facility], [address], [place], [place] [address]    Special Requests   Final    BOTTLES DRAWN AEROBIC AND ANAEROBIC Blood Culture adequate volume Performed at [facility], [address] [place] [address], [place], [place] [address]    Culture   Final    NO GROWTH 5 DAYS Performed at [facility], [address], [place], [place] [address]    Report Status 12/04/2023 FINAL  Final  Blood Culture (routine x 2)     Status: Abnormal   Collection Time: 11/29/23  7:25 PM   Specimen: BLOOD RIGHT ARM  Result Value Ref Range Status   Specimen Description   Final    BLOOD RIGHT ARM Performed at [facility], [address], [place], [place] [address]    Special Requests   Final    BOTTLES DRAWN AEROBIC AND ANAEROBIC Blood Culture results may not be optimal due to an inadequate volume of blood received in culture bottles Performed at [facility], [address], [place], [place] [address]    Culture  Setup Time   Final    GRAM POSITIVE COCCI IN CLUSTERS IN BOTH AEROBIC AND ANAEROBIC BOTTLES CRITICAL RESULT CALLED TO, READ BACK BY AND VERIFIED WITH: CHARGE RN GIA BONIS [id number] 1847 BY J RAZZAK, MT    Culture (A)  Final    STAPHYLOCOCCUS EPIDERMIDIS THE SIGNIFICANCE OF ISOLATING THIS ORGANISM FROM A SINGLE SET OF BLOOD CULTURES WHEN MULTIPLE SETS ARE DRAWN IS UNCERTAIN. PLEASE NOTIFY THE MICROBIOLOGY DEPARTMENT WITHIN ONE WEEK IF SPECIATION AND SENSITIVITIES ARE REQUIRED. Performed at [facility], [address], [place], [place] [address]    Report Status 12/02/2023 FINAL  Final  Blood Culture ID Panel (Reflexed)     Status: Abnormal   Collection Time: 11/29/23  7:25 PM  Result Value Ref Range Status   Enterococcus faecalis NOT DETECTED NOT DETECTED Final   Enterococcus Faecium NOT DETECTED NOT DETECTED Final   Listeria monocytogenes NOT DETECTED NOT DETECTED Final   Staphylococcus species DETECTED (A) NOT DETECTED Final    Comment: CRITICAL RESULT CALLED TO, READ BACK BY AND VERIFIED WITH: CHARGE RN GIA BONIS [id number] 1847 BY J RAZZAK, MT    Staphylococcus aureus (BCID) NOT DETECTED NOT DETECTED Final   Staphylococcus epidermidis DETECTED (A) NOT DETECTED Final    Comment: Methicillin (oxacillin) resistant coagulase negative staphylococcus. Possible blood culture contaminant (unless isolated from more than one blood culture draw or clinical case suggests pathogenicity). No antibiotic treatment is indicated for blood  culture contaminants. CRITICAL RESULT CALLED TO, READ BACK BY AND VERIFIED WITH: CHARGE RN GIA BONIS [id number] 1847 BY J RAZZAK, MT    Staphylococcus lugdunensis NOT DETECTED NOT DETECTED Final   Streptococcus species NOT DETECTED NOT DETECTED Final   Streptococcus agalactiae NOT DETECTED NOT DETECTED Final   Streptococcus pneumoniae NOT DETECTED NOT DETECTED Final   Streptococcus  pyogenes NOT DETECTED NOT DETECTED Final   A.calcoaceticus-baumannii NOT DETECTED NOT DETECTED Final   Bacteroides fragilis NOT DETECTED NOT DETECTED Final   Enterobacterales NOT DETECTED NOT DETECTED Final   Enterobacter cloacae complex NOT DETECTED NOT DETECTED Final   Escherichia coli NOT  DETECTED NOT DETECTED Final   Klebsiella aerogenes NOT DETECTED NOT DETECTED Final   Klebsiella oxytoca NOT DETECTED NOT DETECTED Final   Klebsiella pneumoniae NOT DETECTED NOT DETECTED Final   Proteus species NOT DETECTED NOT DETECTED Final   Salmonella species NOT DETECTED NOT DETECTED Final   Serratia marcescens NOT DETECTED NOT DETECTED Final   Haemophilus influenzae NOT DETECTED NOT DETECTED Final   Neisseria meningitidis NOT DETECTED NOT DETECTED Final   Pseudomonas aeruginosa NOT DETECTED NOT DETECTED Final   Stenotrophomonas maltophilia NOT DETECTED NOT DETECTED Final   Candida albicans NOT DETECTED NOT DETECTED Final   Candida auris NOT DETECTED NOT DETECTED Final   Candida glabrata NOT DETECTED NOT DETECTED Final   Candida krusei NOT DETECTED NOT DETECTED Final   Candida parapsilosis NOT DETECTED NOT DETECTED Final   Candida tropicalis NOT DETECTED NOT DETECTED Final   Cryptococcus neoformans/gattii NOT DETECTED NOT DETECTED Final   Methicillin resistance mecA/C DETECTED (A) NOT DETECTED Final    Comment: CRITICAL RESULT CALLED TO, READ BACK BY AND VERIFIED WITH: CHARGE RN GIA CRETA [id number] 1847 BY JINNY COMMON, MT Performed at [facility], [address], [place], [place] [address]   Urine Culture     Status: Abnormal   Collection Time: 11/29/23 10:13 PM   Specimen: Urine, Clean Catch  Result Value Ref Range Status   Specimen Description   Final    URINE, CLEAN CATCH Performed at [facility], [address], [place], [place] [address]    Special Requests   Final    NONE Performed at [facility] [place], [address], [place], [place]  [address]    Culture MULTIPLE SPECIES PRESENT, SUGGEST RECOLLECTION (A)  Final   Report Status 12/01/2023 FINAL  Final  Culture, blood (routine x 2)     Status: None   Collection Time: 11/30/23 10:00 PM   Specimen: BLOOD  Result Value Ref Range Status   Specimen Description   Final    BLOOD LEFT ANTECUBITAL Performed at [facility], [address] [place], [place] [address]    Special Requests   Final    BOTTLES DRAWN AEROBIC ONLY Blood Culture adequate volume Performed at [facility], [address] [place], [place] [address]    Culture   Final    NO GROWTH 5 DAYS Performed at [facility], [address] [place], [place] [address]    Report Status 12/06/2023 FINAL  Final  Culture, blood (routine x 2)     Status: None   Collection Time: 11/30/23 10:13 PM   Specimen: BLOOD LEFT FOREARM  Result Value Ref Range Status   Specimen Description   Final    BLOOD LEFT FOREARM Performed at [facility], [address] [place], [place] [address]    Special Requests   Final    BOTTLES DRAWN AEROBIC AND ANAEROBIC Blood Culture results may not be optimal due to an inadequate volume of blood received in culture bottles Performed at [facility], [address] [place], [place] [address]    Culture   Final    NO GROWTH 5 DAYS Performed at [facility], [address] [place], [place] [address]    Report Status 12/06/2023 FINAL  Final  Culture, blood (x 2)     Status: None (Preliminary result)   Collection Time: 12/05/23 12:45 AM   Specimen: BLOOD RIGHT HAND  Result Value Ref Range Status  Specimen Description BLOOD RIGHT HAND  Final   Special Requests   Final    BOTTLES DRAWN AEROBIC AND ANAEROBIC Blood Culture results may not be optimal due to an inadequate volume of blood received in culture bottles   Culture   Final    NO GROWTH 1 DAY Performed at [facility], [address], [place], [place]  [address]    Report Status PENDING  Incomplete  MRSA Next Gen by PCR, Nasal     Status: None   Collection Time: 12/05/23  6:49 PM   Specimen: Nasal Mucosa; Nasal Swab  Result Value Ref Range Status   MRSA by PCR Next Gen NOT DETECTED NOT DETECTED Final    Comment: (NOTE) The GeneXpert MRSA Assay (FDA approved for NASAL specimens only), is one component of a comprehensive MRSA colonization surveillance program. It is not intended to diagnose MRSA infection nor to guide or monitor treatment for MRSA infections. Test performance is not FDA approved in patients less than 61 years old. Performed at [facility], [address] [place], [place] [address]     Radiology Report DG Chest Port 1 View Result Date: 12/06/2023 EXAM: 1 VIEW XRAY OF THE CHEST 12/06/2023 07:43:00 AM COMPARISON: 12/05/2023 CLINICAL HISTORY: 141880 SOB (shortness of breath) 141880. Reason for exam: pt order states SOB (shortness of breath) SOB (shortness of breath) 141880. Reason for exam: pt order states SOB (shortness of breath) FINDINGS: LUNGS AND PLEURA: Mild interstitial edema bilateral veil-like opacification over the lungs concerning for posterior layering pleural effusions. No pneumothorax or consolidative change. HEART AND MEDIASTINUM: Stable enlargement of the cardiac silhouette. BONES AND SOFT TISSUES: No acute osseous abnormality. IMPRESSION: 1. Mild interstitial edema and bilateral veil-like opacification, concerning for posterior layering pleural effusions. 2. Stable enlargement of the cardiac silhouette. Electronically signed by: Waddell Calk MD 12/06/2023 07:49 AM EDT RP Workstation: [id number]   DG Chest Port 1 View Result Date: 12/05/2023 CLINICAL DATA:  [facility] EXAM: PORTABLE CHEST 1 VIEW COMPARISON:  11/29/2023 FINDINGS: Cardiac shadow is enlarged in size. Central vascular congestion is noted. Left-sided effusion is noted. No focal infiltrate is noted. No bony abnormality is seen. IMPRESSION: CHF with  left-sided effusion. Electronically Signed   By: Oneil Devonshire M.D.   On: 12/05/2023 02:11     Signature  -   Lavada Stank M.D on 12/06/2023 at 9:09 AM   -  To page go to [email or web address]

## 2023-12-06 NOTE — Progress Notes (Signed)
 Patient refused to have lab work drawn this morning. Dr. Dennise notified. No new orders received. Will report off to oncoming nurse.

## 2023-12-06 NOTE — Consult Note (Signed)
 Palliative Care Consult Note                                  Date: 12/06/2023   Patient Name: Elizabeth [name]  DOB: 1940/08/05  MRN: [id number]  Age / Sex: 83 y.o., female  PCP: Albina GORMAN Dine, MD Referring Physician: Dennise Lavada POUR, MD  Reason for Consultation: Establishing goals of care  HPI/Patient Profile: 83 y.o. female  with past medical history of heart failure, arthritis, asthma, GERD, HTN, DM II, PAF, HLD, OSA, CKD, and memory impairment admitted from [facility] where she has LTC on 12/05/2023 with lethargy and respiratory distress.   Past Medical History:  Diagnosis Date   (HFpEF) heart failure with preserved ejection fraction (HCC) 12/24/2022   Arthritis    Asthma    Breast mass in female    Bronchitis    Bronchitis    Constipation    Cough    GERD (gastroesophageal reflux disease)    Hypertension associated with diabetes (HCC)    Type 2 diabetes mellitus (HCC)    Wheezing     Subjective:   Today's Discussion: I have reviewed medical records including EPIC notes, labs and imaging, received update from nursing and attending provider and met with the patient. I introduced Palliative Medicine as specialized medical care for people living with serious illness. It focuses on providing relief from symptoms and stress of a serious illness. The goal is to improve quality of life for both the patient and the family.   Patient sitting up in bed watching television. She is alert. She currently feels good. She does not remember much about coming to the hospital. She recounts her breakfast and shared her appetite has been good today. She asks for a coffee.   Patient shared her daughter helps her with medical decisions. Patient agrees to me calling the daughter to set up a meeting. Patient's daughter has experience with palliative medicine but then describes hospice. Discussed differences in palliative medicine and hospice  care. Daughter is HCPOA-- asked her to bring documentation. Daughter confirms her mother is DNR/DNI. Plan to meet tomorrow 8/17 with patient and her daughter to discuss diagnosis prognosis, GOC, EOL wishes, disposition and options.  Briefly discussed patient's chronic conditions and acute hospitalization. Patient has been living in nursing facility since her husband passed away. Daughter has some concerns about the nursing facility.  Discussed the importance of continued conversation with family and the medical providers regarding overall plan of care and treatment options, ensuring decisions are within the context of the patient's values and GOCs.Questions and concerns were addressed. The family was encouraged to call with questions or concerns. PMT will continue to support holistically.  Review of Systems  Objective:   Primary Diagnoses: Present on Admission:  Acute respiratory failure with hypoxia and hypercapnia (HCC)  Acute metabolic encephalopathy  Pressure injury of skin  SIRS (systemic inflammatory response syndrome) (HCC)  Acute on chronic diastolic CHF (congestive heart failure) (HCC)  Macrocytic anemia  Paroxysmal atrial fibrillation (HCC)  Morbid obesity (HCC)  Memory impairment  GERD (gastroesophageal reflux disease)  OSA (obstructive sleep apnea)  Essential hypertension   Physical Exam Vitals reviewed.  Constitutional:      General: She is not in acute distress. HENT:     Head: Normocephalic and atraumatic.  Cardiovascular:     Rate and Rhythm: Normal rate.  Pulmonary:     Effort: Pulmonary effort is  normal.  Skin:    General: Skin is warm and dry.  Neurological:     Mental Status: She is alert. Mental status is at baseline.  Psychiatric:        Mood and Affect: Mood normal.        Behavior: Behavior normal.     Vital Signs:  BP 123/61 (BP Location: Left Wrist)   Pulse 68   Temp 98.9 F (37.2 C) (Oral)   Resp 15   Ht 5' 4 (1.626 m)   Wt 123.2 kg    SpO2 98%   BMI 46.62 kg/m    Advanced Care Planning:   Existing Vynca/ACP Documentation: None  Primary Decision Maker: PATIENT. Proxy decision maker is patient's daughter Duayne Pizza. Requested HCPOA documentation.  Code Status/Advance Care Planning: DNR  Assessment & Plan:   SUMMARY OF RECOMMENDATIONS   Continue DNR/DNI Continued GOC discussion- Meeting scheduled for Sunday at 12:00 with daughter Duayne Pizza Continued PMT support   Discussed with: Dr. Dennise  Time Total: 40 minutes    Thank you for allowing us  to participate in the care of KILAH DRAHOS PMT will continue to support holistically.   Signed by: Stephane Palin, NP Palliative Medicine Team  Team Phone # [phone] (Nights/Weekends)  12/06/2023, 12:46 PM

## 2023-12-07 DIAGNOSIS — G9341 Metabolic encephalopathy: Secondary | ICD-10-CM | POA: Diagnosis not present

## 2023-12-07 LAB — CBC WITH DIFFERENTIAL/PLATELET
Abs Granulocyte: 2.5 K/uL (ref 1.5–6.5)
Abs Immature Granulocytes: 0.02 K/uL (ref 0.00–0.07)
Basophils Absolute: 0 K/uL (ref 0.0–0.1)
Basophils Relative: 1 %
Eosinophils Absolute: 0.2 K/uL (ref 0.0–0.5)
Eosinophils Relative: 4 %
HCT: 30.5 % — ABNORMAL LOW (ref 36.0–46.0)
Hemoglobin: 9 g/dL — ABNORMAL LOW (ref 12.0–15.0)
Immature Granulocytes: 1 %
Lymphocytes Relative: 26 %
Lymphs Abs: 1.2 K/uL (ref 0.7–4.0)
MCH: 29.9 pg (ref 26.0–34.0)
MCHC: 29.5 g/dL — ABNORMAL LOW (ref 30.0–36.0)
MCV: 101.3 fL — ABNORMAL HIGH (ref 80.0–100.0)
Monocytes Absolute: 0.6 K/uL (ref 0.1–1.0)
Monocytes Relative: 14 %
Neutro Abs: 2.5 K/uL (ref 1.7–7.7)
Neutrophils Relative %: 54 %
Platelets: 138 K/uL — ABNORMAL LOW (ref 150–400)
RBC: 3.01 MIL/uL — ABNORMAL LOW (ref 3.87–5.11)
RDW: 14.2 % (ref 11.5–15.5)
WBC: 4.4 K/uL (ref 4.0–10.5)
nRBC: 0 % (ref 0.0–0.2)

## 2023-12-07 LAB — BASIC METABOLIC PANEL WITH GFR
Anion gap: 8 (ref 5–15)
BUN: 19 mg/dL (ref 8–23)
CO2: 38 mmol/L — ABNORMAL HIGH (ref 22–32)
Calcium: 9.3 mg/dL (ref 8.9–10.3)
Chloride: 94 mmol/L — ABNORMAL LOW (ref 98–111)
Creatinine, Ser: 0.61 mg/dL (ref 0.44–1.00)
GFR, Estimated: >60 mL/min (ref >60)
Glucose, Bld: 93 mg/dL (ref 70–99)
Potassium: 5.1 mmol/L (ref 3.5–5.1)
Sodium: 140 mmol/L (ref 135–145)

## 2023-12-07 LAB — MAGNESIUM: Magnesium: 1.8 mg/dL (ref 1.7–2.4)

## 2023-12-07 LAB — PHOSPHORUS: Phosphorus: 2.9 mg/dL (ref 2.5–4.6)

## 2023-12-07 MED ORDER — ALBUTEROL SULFATE (2.5 MG/3ML) 0.083% IN NEBU
2.5000 mg | INHALATION_SOLUTION | Freq: Two times a day (BID) | RESPIRATORY_TRACT | Status: DC
  Administered 2023-12-07 – 2023-12-08 (×2): 2.5 mg via RESPIRATORY_TRACT
  Filled 2023-12-07 (×2): qty 3

## 2023-12-07 MED ORDER — SODIUM ZIRCONIUM CYCLOSILICATE 10 G PO PACK
10.0000 g | PACK | Freq: Once | ORAL | Status: AC
  Administered 2023-12-07: 10 g via ORAL
  Filled 2023-12-07: qty 1

## 2023-12-07 MED ORDER — FUROSEMIDE 40 MG PO TABS
40.0000 mg | ORAL_TABLET | Freq: Every day | ORAL | Status: DC
  Administered 2023-12-07 – 2023-12-08 (×2): 40 mg via ORAL
  Filled 2023-12-07 (×2): qty 1

## 2023-12-07 MED ORDER — HYDRALAZINE HCL 50 MG PO TABS
50.0000 mg | ORAL_TABLET | Freq: Three times a day (TID) | ORAL | Status: DC
  Administered 2023-12-07 – 2023-12-08 (×4): 50 mg via ORAL
  Filled 2023-12-07 (×4): qty 1

## 2023-12-07 MED ORDER — LOSARTAN POTASSIUM 50 MG PO TABS
50.0000 mg | ORAL_TABLET | Freq: Every day | ORAL | Status: DC
  Administered 2023-12-07 – 2023-12-08 (×2): 50 mg via ORAL
  Filled 2023-12-07 (×2): qty 1

## 2023-12-07 MED ORDER — HYDRALAZINE HCL 50 MG PO TABS: 50.0000 mg | ORAL_TABLET | Freq: Three times a day (TID) | ORAL | Status: DC

## 2023-12-07 NOTE — Plan of Care (Signed)
 Problem: Fluid Volume: Goal: Hemodynamic stability will improve 12/07/2023 0517 by Thelbert Camelia SAUNDERS, RN Outcome: Progressing 12/07/2023 0517 by Thelbert Camelia SAUNDERS, RN Outcome: Progressing   Problem: Clinical Measurements: Goal: Diagnostic test results will improve 12/07/2023 0517 by Thelbert Camelia SAUNDERS, RN Outcome: Progressing 12/07/2023 0517 by Thelbert Camelia SAUNDERS, RN Outcome: Progressing Goal: Signs and symptoms of infection will decrease 12/07/2023 0517 by Thelbert Camelia SAUNDERS, RN Outcome: Progressing 12/07/2023 0517 by Thelbert Camelia SAUNDERS, RN Outcome: Progressing   Problem: Respiratory: Goal: Ability to maintain adequate ventilation will improve 12/07/2023 0517 by Thelbert Camelia SAUNDERS, RN Outcome: Progressing 12/07/2023 0517 by Thelbert Camelia SAUNDERS, RN Outcome: Progressing   Problem: Education: Goal: Knowledge of General Education information will improve Description: Including pain rating scale, medication(s)/side effects and non-pharmacologic comfort measures 12/07/2023 0517 by Thelbert Camelia SAUNDERS, RN Outcome: Progressing 12/07/2023 0517 by Thelbert Camelia SAUNDERS, RN Outcome: Progressing   Problem: Health Behavior/Discharge Planning: Goal: Ability to manage health-related needs will improve 12/07/2023 0517 by Thelbert Camelia SAUNDERS, RN Outcome: Progressing 12/07/2023 0517 by Thelbert Camelia SAUNDERS, RN Outcome: Progressing   Problem: Clinical Measurements: Goal: Ability to maintain clinical measurements within normal limits will improve 12/07/2023 0517 by Thelbert Camelia SAUNDERS, RN Outcome: Progressing 12/07/2023 0517 by Thelbert Camelia SAUNDERS, RN Outcome: Progressing Goal: Will remain free from infection 12/07/2023 0517 by Thelbert Camelia SAUNDERS, RN Outcome: Progressing 12/07/2023 0517 by Thelbert Camelia SAUNDERS, RN Outcome: Progressing Goal: Diagnostic test results will improve 12/07/2023 0517 by Thelbert Camelia SAUNDERS, RN Outcome: Progressing 12/07/2023 0517 by Thelbert Camelia SAUNDERS, RN Outcome:  Progressing Goal: Respiratory complications will improve 12/07/2023 0517 by Thelbert Camelia SAUNDERS, RN Outcome: Progressing 12/07/2023 0517 by Thelbert Camelia SAUNDERS, RN Outcome: Progressing Goal: Cardiovascular complication will be avoided 12/07/2023 0517 by Thelbert Camelia SAUNDERS, RN Outcome: Progressing 12/07/2023 0517 by Thelbert Camelia SAUNDERS, RN Outcome: Progressing   Problem: Activity: Goal: Risk for activity intolerance will decrease 12/07/2023 0517 by Thelbert Camelia SAUNDERS, RN Outcome: Progressing 12/07/2023 0517 by Thelbert Camelia SAUNDERS, RN Outcome: Progressing   Problem: Nutrition: Goal: Adequate nutrition will be maintained 12/07/2023 0517 by Thelbert Camelia SAUNDERS, RN Outcome: Progressing 12/07/2023 0517 by Thelbert Camelia SAUNDERS, RN Outcome: Progressing   Problem: Coping: Goal: Level of anxiety will decrease 12/07/2023 0517 by Thelbert Camelia SAUNDERS, RN Outcome: Progressing 12/07/2023 0517 by Thelbert Camelia SAUNDERS, RN Outcome: Progressing   Problem: Elimination: Goal: Will not experience complications related to bowel motility 12/07/2023 0517 by Thelbert Camelia SAUNDERS, RN Outcome: Progressing 12/07/2023 0517 by Thelbert Camelia SAUNDERS, RN Outcome: Progressing Goal: Will not experience complications related to urinary retention 12/07/2023 0517 by Thelbert Camelia SAUNDERS, RN Outcome: Progressing 12/07/2023 0517 by Thelbert Camelia SAUNDERS, RN Outcome: Progressing   Problem: Pain Managment: Goal: General experience of comfort will improve and/or be controlled 12/07/2023 0517 by Thelbert Camelia SAUNDERS, RN Outcome: Progressing 12/07/2023 0517 by Thelbert Camelia SAUNDERS, RN Outcome: Progressing   Problem: Safety: Goal: Ability to remain free from injury will improve 12/07/2023 0517 by Thelbert Camelia SAUNDERS, RN Outcome: Progressing 12/07/2023 0517 by Thelbert Camelia SAUNDERS, RN Outcome: Progressing   Problem: Skin Integrity: Goal: Risk for impaired skin integrity will decrease 12/07/2023 0517 by Thelbert Camelia SAUNDERS, RN Outcome:  Progressing 12/07/2023 0517 by Thelbert Camelia SAUNDERS, RN Outcome: Progressing   Problem: Education: Goal: Ability to demonstrate management of disease process will improve Outcome: Progressing Goal: Ability to verbalize understanding of medication therapies will improve Outcome: Progressing Goal: Individualized Educational Video(s) Outcome: Progressing   Problem: Activity: Goal: Capacity to carry out activities will improve Outcome:  Progressing   Problem: Cardiac: Goal: Ability to achieve and maintain adequate cardiopulmonary perfusion will improve Outcome: Progressing

## 2023-12-07 NOTE — Progress Notes (Signed)
 PROGRESS NOTE                                                                                                                                                                                                             Patient Demographics:    Elizabeth [name], is a 83 y.o. female, DOB - [birth year], FMW:[id number]  Outpatient Primary MD for the patient is Albina GORMAN Dine, MD    LOS - 2  Admit date - 12/05/2023    Chief Complaint  Patient presents with   Altered Mental Status    Per facility pt is normally alert and oriented, started today pt is AMS        Brief Narrative (HPI from H&P)    83 y.o. female with medical history significant of hypertension, HFpEF, chronic venous insufficiency, diabetes mellitus type 2, reactive airway disease, chronic hypercapnic respiratory failure, memory impairment, obstructive sleep apnea not compliant with CPAP, and GERD presents with lethargy and respiratory distress.  She is accompanied by her daughter who is present at bedside and gives most of the history.   She has been experiencing increased lethargy and respiratory distress. Her daughter notes that she has been more lethargic and unresponsive. She was previously treated for a urinary tract infection, but the medication did not seem effective as her symptoms persisted. The emergency department advised her daughter to bring her back to the hospital.  In the ER she was diagnosed with a UTI, constipation, dehydration and admitted to the hospital.   Subjective:   Patient in bed, appears comfortable, denies any headache, no fever, no chest pain or pressure, no shortness of breath , no abdominal pain. No new focal weakness.   Assessment  & Plan :    Acute metabolic encephalopathy due to sepsis from UTI present on admission along with mild hypercapnia.  She was adequately treated with initially BiPAP, IV antibiotics, no headache, no focal  deficits, mentation close to baseline, continue empiric antibiotic and follow cultures, nighttime CPAP continue, sepsis pathophysiology has resolved.  Monitor cultures.   Respiratory failure with hypercapnia and hypoxia - Reactive airway disease - Acute on chronic.  Patient noted was noted to be hypercapnic with initial PCO2 73.9 on venous blood gas. Continue CPAP at night while here and at SNF, of note patient is noncompliant with her CPAP has been counseled,  clinically hypercapnia and hypoxia have resolved.    SIRS/sepsis - Urinary tract infection kindly see above   Heart failure with preserved ejection fraction Acute on chronic.  On physical exam patient has mild lower extremity swelling.  Chest x-ray noted signs of CHF with left-sided pleural effusion. Last echocardiogram noted EF to be 55 to 60% with grade 1 diastolic dysfunction when checked on 09/15/2023.  Patient had been given Lasix  40 mg IV.  Since she has developed some AKI will dose Lasix  on a as needed basis from now on.  Acute renal insufficiency Acute.  On admission creatinine noted to be 1.08 with BUN 23.  Baseline creatinine previously noted to be around 0.5-0.9. Hold ARB, hold diuretics and monitor.   Macrocytic anemia Chronic.  Hemoglobin noted to be 9.4 which appears slightly improved from when discharged from previous hospitalization on 8/10 of 8.8.  Monitor.  Type screen also done.  Paroxysmal atrial fibrillation on chronic anticoagulation Patient appears to be in a sinus rhythm at this time. Continue Eliquis    Controlled diabetes mellitus type 2 Last available hemoglobin A1c noted to be 4.5 when checked on 09/15/2023.  Continue Jardiance    Essential hypertension Blood pressure improving, medications adjusted on 12/07/2023.   Mild memory impairment - Delirium precautions, Continue Aricept    GERD - Continue PPI   OSA - Patient's daughter notes that she had not been routinely being placed on CPAP at night at the facility as  she was supposed to which is thought to have led to her being hospitalized, Continue CPAP nightly   Constipation - Patient last had a bowel movement yesterday but has been dealing with constipation for some time. Senokot-S twice daily.   Monitor.  Morbid obesity -  BMI 45.41 kg/m, follow-up with PCP        Condition -  Guarded  Family Communication  :  daughter 35   Code Status :  DNR  Consults  :  None  PUD Prophylaxis :  PPI   Procedures  :            Disposition Plan  :    Status is: Inpatient  DVT Prophylaxis  :     apixaban  (ELIQUIS ) tablet 5 mg    Lab Results  Component Value Date   PLT 138 (L) 12/07/2023    Diet :  Diet Order             Diet Heart Room service appropriate? Yes; Fluid consistency: Thin  Diet effective now                    Inpatient Medications  Scheduled Meds:  albuterol   2.5 mg Nebulization QID   amLODipine   10 mg Oral Daily   apixaban   5 mg Oral BID   arformoterol   15 mcg Nebulization BID   budesonide  (PULMICORT ) nebulizer solution  0.5 mg Nebulization BID   cyanocobalamin   1,000 mcg Oral Daily   diclofenac  Sodium  4 g Topical QID   donepezil   5 mg Oral QHS   fluticasone   1 spray Each Nare BID   hydrALAZINE   50 mg Oral Q8H   loratadine   10 mg Oral Daily   melatonin  6 mg Oral QHS   pantoprazole   40 mg Oral Daily   rosuvastatin   5 mg Oral QHS   senna-docusate  1 tablet Oral BID   sertraline   25 mg Oral Daily   sodium chloride  flush  3 mL Intravenous Q12H   sodium zirconium  cyclosilicate  10 g Oral Once   Continuous Infusions:  cefTRIAXone  (ROCEPHIN )  IV 2 g (12/06/23 2301)   PRN Meds:.acetaminophen  **OR** acetaminophen , albuterol , artificial tears, hydrALAZINE     Objective:   Vitals:   12/06/23 2351 12/07/23 0403 12/07/23 0500 12/07/23 0814  BP: 136/69 (!) 148/79  (!) 197/65  Pulse: 75 68  64  Resp: 20 20  16   Temp: 98.3 F (36.8 C) 98 F (36.7 C)  97.6 F (36.4 C)  TempSrc: Oral Oral   Axillary  SpO2: 95% 99%  100%  Weight:   116 kg   Height:        Wt Readings from Last 3 Encounters:  12/07/23 116 kg  11/29/23 118.8 kg  09/18/23 118 kg     Intake/Output Summary (Last 24 hours) at 12/07/2023 0931 Last data filed at 12/07/2023 0816 Gross per 24 hour  Intake 3 ml  Output 2050 ml  Net -2047 ml     Physical Exam  Awake Alert, No new F.N deficits, Normal affect Datil.AT,PERRAL Supple Neck, No JVD,   Symmetrical Chest wall movement, Good air movement bilaterally, CTAB RRR,No Gallops,Rubs or new Murmurs,  +ve B.Sounds, Abd Soft, No tenderness,   No Cyanosis, Clubbing or edema     Data Review:    Recent Labs  Lab 11/30/23 2206 12/05/23 0050 12/05/23 0058 12/05/23 0425 12/06/23 1103 12/07/23 0630  WBC 5.5 3.9*  --   --  6.2 4.4  HGB 8.8* 9.4* 10.9*  10.9* 9.5* 8.6* 9.0*  HCT 31.9* 34.6* 32.0*  32.0* 28.0* 29.8* 30.5*  PLT 140* 162  --   --  162 138*  MCV 107.4* 109.8*  --   --  103.1* 101.3*  MCH 29.6 29.8  --   --  29.8 29.9  MCHC 27.6* 27.2*  --   --  28.9* 29.5*  RDW 14.4 14.5  --   --  14.2 14.2  LYMPHSABS 1.0 0.5*  --   --   --  1.2  MONOABS 0.7 0.4  --   --   --  0.6  EOSABS 0.2 0.0  --   --   --  0.2  BASOSABS 0.0 0.0  --   --   --  0.0    Recent Labs  Lab 11/30/23 2206 12/05/23 0050 12/05/23 0058 12/05/23 0059 12/05/23 0424 12/05/23 0425 12/05/23 0925 12/05/23 0941 12/05/23 2132 12/06/23 1103 12/07/23 0630  NA 141 138 137  136  --   --  139  --   --   --  137 140  K 4.6 5.5* 5.5*  5.4*  --   --  5.1  --   --   --  4.7 5.1  CL 99 93* 97*  --   --   --   --   --   --  91* 94*  CO2 36* 34*  --   --   --   --   --   --   --  37* 38*  ANIONGAP 6 11  --   --   --   --   --   --   --  9 8  GLUCOSE 104* 118* 118*  --   --   --   --   --   --  113* 93  BUN 24* 23 27*  --   --   --   --   --   --  28* 19  CREATININE 0.52 1.08* 1.30*  --   --   --   --   --   --  0.92 0.61  AST  --  11*  --   --   --   --   --   --   --   --   --    ALT  --  11  --   --   --   --   --   --   --   --   --   ALKPHOS  --  44  --   --   --   --   --   --   --   --   --   BILITOT  --  0.3  --   --   --   --   --   --   --   --   --   ALBUMIN  --  3.0*  --   --   --   --   --   --   --   --   --   CRP  --   --   --   --   --   --   --   --  1.7* 1.5*  --   PROCALCITON  --   --   --   --   --   --   --  <0.10  --  <0.10  --   LATICACIDVEN  --   --   --  0.5 0.3*  --   --   --   --   --   --   INR  --  1.1  --   --   --   --   --   --   --   --   --   BNP  --   --   --   --   --   --  391.2*  --   --   --   --   MG  --   --   --   --   --   --   --   --   --  1.6* 1.8  PHOS  --   --   --   --   --   --   --   --   --   --  2.9  CALCIUM  9.2 9.3  --   --   --   --   --   --   --  8.8* 9.3      Recent Labs  Lab 11/30/23 2206 12/05/23 0050 12/05/23 0059 12/05/23 0424 12/05/23 0925 12/05/23 0941 12/05/23 2132 12/06/23 1103 12/07/23 0630  CRP  --   --   --   --   --   --  1.7* 1.5*  --   PROCALCITON  --   --   --   --   --  <0.10  --  <0.10  --   LATICACIDVEN  --   --  0.5 0.3*  --   --   --   --   --   INR  --  1.1  --   --   --   --   --   --   --   BNP  --   --   --   --  391.2*  --   --   --   --   MG  --   --   --   --   --   --   --  1.6* 1.8  CALCIUM  9.2 9.3  --   --   --   --   --  8.8* 9.3    Lab Results  Component Value Date   HGBA1C 4.5 (L) 09/15/2023     Micro Results Recent Results (from the past 240 hours)  Resp panel by RT-PCR (RSV, Flu A&B, Covid) Anterior Nasal Swab     Status: None   Collection Time: 11/29/23  6:41 PM   Specimen: Anterior Nasal Swab  Result Value Ref Range Status   SARS Coronavirus 2 by RT PCR NEGATIVE NEGATIVE Final    Comment: (NOTE) SARS-CoV-2 target nucleic acids are NOT DETECTED.  The SARS-CoV-2 RNA is generally detectable in upper respiratory specimens during the acute phase of infection. The lowest concentration of SARS-CoV-2 viral copies this assay can detect is 138 copies/mL.  A negative result does not preclude SARS-Cov-2 infection and should not be used as the sole basis for treatment or other patient management decisions. A negative result may occur with  improper specimen collection/handling, submission of specimen other than nasopharyngeal swab, presence of viral mutation(s) within the areas targeted by this assay, and inadequate number of viral copies(<138 copies/mL). A negative result must be combined with clinical observations, patient history, and epidemiological information. The expected result is Negative.  Fact Sheet for Patients:  BloggerCourse.com  Fact Sheet for Healthcare Providers:  SeriousBroker.it  This test is no t yet approved or cleared by the [place]  FDA and  has been authorized for detection and/or diagnosis of SARS-CoV-2 by FDA under an Emergency Use Authorization (EUA). This EUA will remain  in effect (meaning this test can be used) for the duration of the COVID-19 declaration under Section 564(b)(1) of the Act, 21 U.S.C.section 360bbb-3(b)(1), unless the authorization is terminated  or revoked sooner.       Influenza A by PCR NEGATIVE NEGATIVE Final   Influenza B by PCR NEGATIVE NEGATIVE Final    Comment: (NOTE) The Xpert Xpress SARS-CoV-2/FLU/RSV plus assay is intended as an aid in the diagnosis of influenza from Nasopharyngeal swab specimens and should not be used as a sole basis for treatment. Nasal washings and aspirates are unacceptable for Xpert Xpress SARS-CoV-2/FLU/RSV testing.  Fact Sheet for Patients: BloggerCourse.com  Fact Sheet for Healthcare Providers: SeriousBroker.it  This test is not yet approved or cleared by the [place]  FDA and has been authorized for detection and/or diagnosis of SARS-CoV-2 by FDA under an Emergency Use Authorization (EUA). This EUA will remain in effect (meaning this test  can be used) for the duration of the COVID-19 declaration under Section 564(b)(1) of the Act, 21 U.S.C. section 360bbb-3(b)(1), unless the authorization is terminated or revoked.     Resp Syncytial Virus by PCR NEGATIVE NEGATIVE Final    Comment: (NOTE) Fact Sheet for Patients: BloggerCourse.com  Fact Sheet for Healthcare Providers: SeriousBroker.it  This test is not yet approved or cleared by the [place]  FDA and has been authorized for detection and/or diagnosis of SARS-CoV-2 by FDA under an Emergency Use Authorization (EUA). This EUA will remain in effect (meaning this test can be used) for the duration of the COVID-19 declaration under Section 564(b)(1) of the Act, 21 U.S.C. section 360bbb-3(b)(1), unless the authorization is terminated or revoked.  Performed at [facility], [address] [place], [place] [address]   Blood Culture (routine x 2)     Status: None   Collection Time: 11/29/23  6:41 PM   Specimen: BLOOD RIGHT ARM  Result Value Ref Range  Status   Specimen Description   Final    BLOOD RIGHT ARM Performed at [facility], [address] [place], [place] [address]    Special Requests   Final    BOTTLES DRAWN AEROBIC AND ANAEROBIC Blood Culture adequate volume Performed at [facility], [address] [place], [place] [address]    Culture   Final    NO GROWTH 5 DAYS Performed at [facility], [address], [place], [place] [address]    Report Status 12/04/2023 FINAL  Final  Blood Culture (routine x 2)     Status: Abnormal   Collection Time: 11/29/23  7:25 PM   Specimen: BLOOD RIGHT ARM  Result Value Ref Range Status   Specimen Description   Final    BLOOD RIGHT ARM Performed at [facility], [address], [place], [place] [address]    Special Requests   Final    BOTTLES DRAWN AEROBIC AND ANAEROBIC Blood Culture results may not be optimal  due to an inadequate volume of blood received in culture bottles Performed at [facility], [address], [place], [place] [address]    Culture  Setup Time   Final    GRAM POSITIVE COCCI IN CLUSTERS IN BOTH AEROBIC AND ANAEROBIC BOTTLES CRITICAL RESULT CALLED TO, READ BACK BY AND VERIFIED WITH: CHARGE RN GIA BONIS [id number] 1847 BY J RAZZAK, MT    Culture (A)  Final    STAPHYLOCOCCUS EPIDERMIDIS THE SIGNIFICANCE OF ISOLATING THIS ORGANISM FROM A SINGLE SET OF BLOOD CULTURES WHEN MULTIPLE SETS ARE DRAWN IS UNCERTAIN. PLEASE NOTIFY THE MICROBIOLOGY DEPARTMENT WITHIN ONE WEEK IF SPECIATION AND SENSITIVITIES ARE REQUIRED. Performed at [facility], [address], [place], [place] [address]    Report Status 12/02/2023 FINAL  Final  Blood Culture ID Panel (Reflexed)     Status: Abnormal   Collection Time: 11/29/23  7:25 PM  Result Value Ref Range Status   Enterococcus faecalis NOT DETECTED NOT DETECTED Final   Enterococcus Faecium NOT DETECTED NOT DETECTED Final   Listeria monocytogenes NOT DETECTED NOT DETECTED Final   Staphylococcus species DETECTED (A) NOT DETECTED Final    Comment: CRITICAL RESULT CALLED TO, READ BACK BY AND VERIFIED WITH: CHARGE RN GIA BONIS [id number] 1847 BY J RAZZAK, MT    Staphylococcus aureus (BCID) NOT DETECTED NOT DETECTED Final   Staphylococcus epidermidis DETECTED (A) NOT DETECTED Final    Comment: Methicillin (oxacillin) resistant coagulase negative staphylococcus. Possible blood culture contaminant (unless isolated from more than one blood culture draw or clinical case suggests pathogenicity). No antibiotic treatment is indicated for blood  culture contaminants. CRITICAL RESULT CALLED TO, READ BACK BY AND VERIFIED WITH: CHARGE RN GIA BONIS [id number] 1847 BY J RAZZAK, MT    Staphylococcus lugdunensis NOT DETECTED NOT DETECTED Final   Streptococcus species NOT DETECTED NOT DETECTED Final   Streptococcus agalactiae NOT DETECTED NOT  DETECTED Final   Streptococcus pneumoniae NOT DETECTED NOT DETECTED Final   Streptococcus pyogenes NOT DETECTED NOT DETECTED Final   A.calcoaceticus-baumannii NOT DETECTED NOT DETECTED Final   Bacteroides fragilis NOT DETECTED NOT DETECTED Final   Enterobacterales NOT DETECTED NOT DETECTED Final   Enterobacter cloacae complex NOT DETECTED NOT DETECTED Final   Escherichia coli NOT DETECTED NOT DETECTED Final   Klebsiella aerogenes NOT DETECTED NOT DETECTED Final   Klebsiella oxytoca NOT DETECTED NOT DETECTED Final   Klebsiella pneumoniae NOT DETECTED NOT DETECTED Final   Proteus species  NOT DETECTED NOT DETECTED Final   Salmonella species NOT DETECTED NOT DETECTED Final   Serratia marcescens NOT DETECTED NOT DETECTED Final   Haemophilus influenzae NOT DETECTED NOT DETECTED Final   Neisseria meningitidis NOT DETECTED NOT DETECTED Final   Pseudomonas aeruginosa NOT DETECTED NOT DETECTED Final   Stenotrophomonas maltophilia NOT DETECTED NOT DETECTED Final   Candida albicans NOT DETECTED NOT DETECTED Final   Candida auris NOT DETECTED NOT DETECTED Final   Candida glabrata NOT DETECTED NOT DETECTED Final   Candida krusei NOT DETECTED NOT DETECTED Final   Candida parapsilosis NOT DETECTED NOT DETECTED Final   Candida tropicalis NOT DETECTED NOT DETECTED Final   Cryptococcus neoformans/gattii NOT DETECTED NOT DETECTED Final   Methicillin resistance mecA/C DETECTED (A) NOT DETECTED Final    Comment: CRITICAL RESULT CALLED TO, READ BACK BY AND VERIFIED WITH: CHARGE RN GIA CRETA [id number] 1847 BY JINNY COMMON, MT Performed at [facility], [address] [place], [place] [address]   Urine Culture     Status: Abnormal   Collection Time: 11/29/23 10:13 PM   Specimen: Urine, Clean Catch  Result Value Ref Range Status   Specimen Description   Final    URINE, CLEAN CATCH Performed at [facility], [address], [place], [place] [address]    Special Requests   Final     NONE Performed at [facility], [address] [place], [place] [address]    Culture MULTIPLE SPECIES PRESENT, SUGGEST RECOLLECTION (A)  Final   Report Status 12/01/2023 FINAL  Final  Culture, blood (routine x 2)     Status: None   Collection Time: 11/30/23 10:00 PM   Specimen: BLOOD  Result Value Ref Range Status   Specimen Description   Final    BLOOD LEFT ANTECUBITAL Performed at [facility], [address], [place], [place] [address]    Special Requests   Final    BOTTLES DRAWN AEROBIC ONLY Blood Culture adequate volume Performed at [facility], [address] [place], [place] [address]    Culture   Final    NO GROWTH 5 DAYS Performed at [facility], [address], [place], [place] [address]    Report Status 12/06/2023 FINAL  Final  Culture, blood (routine x 2)     Status: None   Collection Time: 11/30/23 10:13 PM   Specimen: BLOOD LEFT FOREARM  Result Value Ref Range Status   Specimen Description   Final    BLOOD LEFT FOREARM Performed at [facility], [address] [place], [place] [address]    Special Requests   Final    BOTTLES DRAWN AEROBIC AND ANAEROBIC Blood Culture results may not be optimal due to an inadequate volume of blood received in culture bottles Performed at [facility], [address], [place], [place] [address]    Culture   Final    NO GROWTH 5 DAYS Performed at [facility], [address] [place], [place] [address]    Report Status 12/06/2023 FINAL  Final  Culture, blood (x 2)     Status: None (Preliminary result)   Collection Time: 12/05/23 12:45 AM   Specimen: BLOOD RIGHT HAND  Result Value Ref Range Status   Specimen Description BLOOD RIGHT HAND  Final   Special Requests   Final    BOTTLES DRAWN AEROBIC AND ANAEROBIC Blood Culture results may not be optimal due to an inadequate  volume of blood received in culture bottles   Culture   Final     NO GROWTH 2 DAYS Performed at [facility], [address] [place], [place] [address]    Report Status PENDING  Incomplete  MRSA Next Gen by PCR, Nasal     Status: None   Collection Time: 12/05/23  6:49 PM   Specimen: Nasal Mucosa; Nasal Swab  Result Value Ref Range Status   MRSA by PCR Next Gen NOT DETECTED NOT DETECTED Final    Comment: (NOTE) The GeneXpert MRSA Assay (FDA approved for NASAL specimens only), is one component of a comprehensive MRSA colonization surveillance program. It is not intended to diagnose MRSA infection nor to guide or monitor treatment for MRSA infections. Test performance is not FDA approved in patients less than 20 years old. Performed at [facility], [address] [place], [place] [address]     Radiology Report DG Chest Port 1 View Result Date: 12/06/2023 EXAM: 1 VIEW XRAY OF THE CHEST 12/06/2023 07:43:00 AM COMPARISON: 12/05/2023 CLINICAL HISTORY: 141880 SOB (shortness of breath) 141880. Reason for exam: pt order states SOB (shortness of breath) SOB (shortness of breath) 141880. Reason for exam: pt order states SOB (shortness of breath) FINDINGS: LUNGS AND PLEURA: Mild interstitial edema bilateral veil-like opacification over the lungs concerning for posterior layering pleural effusions. No pneumothorax or consolidative change. HEART AND MEDIASTINUM: Stable enlargement of the cardiac silhouette. BONES AND SOFT TISSUES: No acute osseous abnormality. IMPRESSION: 1. Mild interstitial edema and bilateral veil-like opacification, concerning for posterior layering pleural effusions. 2. Stable enlargement of the cardiac silhouette. Electronically signed by: Waddell Calk MD 12/06/2023 07:49 AM EDT RP Workstation: [id number]     Signature  -   Lavada Stank M.D on 12/07/2023 at 9:31 AM   -  To page go to [email or web address]

## 2023-12-07 NOTE — Plan of Care (Signed)

## 2023-12-07 NOTE — Progress Notes (Signed)
 Daily Progress Note   Patient Name: Elizabeth [name]       Date: 12/07/2023 DOB: 06-Feb-1941  Age: 83 y.o. MRN#: [id number] Attending Physician: Elizabeth Lavada POUR, MD Primary Care Physician: Elizabeth GORMAN Dine, MD Admit Date: 12/05/2023  Reason for Consultation/Follow-up: Establishing goals of care  Length of Stay: 2  Current Medications: Scheduled Meds:   albuterol   2.5 mg Nebulization QID   amLODipine   10 mg Oral Daily   apixaban   5 mg Oral BID   arformoterol   15 mcg Nebulization BID   budesonide  (PULMICORT ) nebulizer solution  0.5 mg Nebulization BID   cyanocobalamin   1,000 mcg Oral Daily   diclofenac  Sodium  4 g Topical QID   donepezil   5 mg Oral QHS   fluticasone   1 spray Each Nare BID   furosemide   40 mg Oral Daily   hydrALAZINE   50 mg Oral Q8H   loratadine   10 mg Oral Daily   losartan   50 mg Oral Daily   melatonin  6 mg Oral QHS   pantoprazole   40 mg Oral Daily   rosuvastatin   5 mg Oral QHS   senna-docusate  1 tablet Oral BID   sertraline   25 mg Oral Daily   sodium chloride  flush  3 mL Intravenous Q12H    Continuous Infusions:  cefTRIAXone  (ROCEPHIN )  IV 2 g (12/06/23 2301)    PRN Meds: acetaminophen  **OR** acetaminophen , albuterol , artificial tears, hydrALAZINE   Physical Exam Vitals reviewed.  Constitutional:      General: She is not in acute distress. HENT:     Head: Normocephalic and atraumatic.  Cardiovascular:     Rate and Rhythm: Normal rate.  Pulmonary:     Effort: Pulmonary effort is normal.  Skin:    General: Skin is warm and dry.  Neurological:     Mental Status: She is alert and oriented to person, place, and time.  Psychiatric:        Mood and Affect: Mood normal.        Behavior: Behavior normal.        Thought Content: Thought content normal.         Judgment: Judgment normal.             Vital Signs: BP (!) 194/71   Pulse 66   Temp 97.6 F (36.4 C) (  Axillary)   Resp 19   Ht 5' 4 (1.626 m)   Wt 116 kg   SpO2 100%   BMI 43.90 kg/m  SpO2: SpO2: 100 % O2 Device: O2 Device: Nasal Cannula O2 Flow Rate: O2 Flow Rate (L/min): 4 L/min     Patient Active Problem List   Diagnosis Date Noted   Acute respiratory failure with hypoxia and hypercapnia (HCC) 12/05/2023   Pressure injury of skin 12/05/2023   SIRS (systemic inflammatory response syndrome) (HCC) 12/05/2023   Renal insufficiency 12/05/2023   Macrocytic anemia 12/05/2023   Paroxysmal atrial fibrillation (HCC) 12/05/2023   Memory impairment 12/05/2023   Acute respiratory failure with hypercapnia (HCC) 09/17/2023   Hypomagnesemia 09/15/2023   Hypophosphatemia 09/15/2023   Acute metabolic encephalopathy 09/14/2023   Reactive airway disease with acute exacerbation 09/14/2023   Acute on chronic diastolic CHF (congestive heart failure) (HCC) 09/14/2023   Colitis 04/24/2023   Sepsis (HCC) 04/23/2023   (HFpEF) heart failure with preserved ejection fraction (HCC) 12/24/2022   Morbid obesity (HCC) 12/24/2022   OSA (obstructive sleep apnea) 03/28/2022   Diabetes mellitus without complication (HCC) 06/19/2021   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) 05/12/2021   COVID-19 virus infection 05/12/2021   Generalized weakness 05/12/2021   Type 2 diabetes mellitus without complication, without long-term current use of insulin  (HCC) 05/12/2021   Essential hypertension 05/12/2021   Asthma 05/12/2021   GERD (gastroesophageal reflux disease)    Leukopenia    Chronic venous insufficiency 01/17/2017   Pain of left breast 01/17/2012    Palliative Care Assessment & Plan   Patient Profile: 83 y.o. female  with past medical history of heart failure, arthritis, asthma, GERD, HTN, DM II, PAF, HLD, OSA, CKD, and memory impairment admitted from [facility] where she has LTC on 12/05/2023 with lethargy and respiratory distress.    Today's Discussion: Patient sitting up in bed watching television. She reports she is feeling much better. Daughter at bedside. We discussed patient's chronic conditions and acute hospitalization. Discussed patient's nursing facility.  Patient's daughter is her HCPOA. Asked daughter to email the [facility] document so it can be scanned into [employer]. Patient shared her daughter is a great advocate for her care and healthcare. Patient shared her quality of life is more important than her quantity of life.  A discussion was had today regarding advanced directives. Concepts specific to code status, artifical feeding and hydration, continued IV antibiotics and rehospitalization was had.  The difference between an aggressive medical intervention path and a comfort care path for this patient at this time was had. MOST form completed today. The patient and family outlined their wishes for the following treatment decisions:  Cardiopulmonary Resuscitation: Do Not Attempt Resuscitation (DNR/No CPR)  Medical Interventions: Limited Additional Interventions: Use medical treatment, IV fluids and cardiac monitoring as indicated, DO NOT USE intubation or mechanical ventilation. May consider use of less invasive airway support such as BiPAP or CPAP. Also provide comfort measures. Transfer to the hospital if indicated. Avoid intensive care.   Antibiotics: Determine use of limitation of antibiotics when infection occurs  IV Fluids: IV fluids for a defined trial period  Feeding Tube: Feeding tube for a defined trial period    Patient and daughter would like the additional support of outpatient palliative at discharge. TOC order placed.   Discussed the importance of continued conversation with family and the medical providers regarding overall plan of care and treatment options, ensuring decisions are within the context of the  patient's values and  GOCs.  Questions and concerns were addressed. Hard Choices booklet left for review. The family was encouraged to call with questions or concerns. PMT will support as needed.  Recommendations/Plan: Continue DNR/DNI MOST form completed- will be scanned into [facility] consulted for outpatient palliative at discharge PMT to support as needed- Please call [phone] with PMT needs    Code Status:    Code Status Orders  (From admission, onward)           Start     Ordered   12/05/23 0902  Do not attempt resuscitation (DNR)- Limited -Do Not Intubate (DNI)  Continuous       Question Answer Comment  If pulseless and not breathing No CPR or chest compressions.   In Pre-Arrest Conditions (Patient Is Breathing and Has A Pulse) Do not intubate. Provide all appropriate non-invasive medical interventions. Avoid ICU transfer unless indicated or required.   Consent: Discussion documented in EHR or advanced directives reviewed      12/05/23 0903         Extensive chart review has been completed prior to seeing the patient including labs, vital signs, imaging, progress/consult notes, orders, medications, and available advance directive documents.  Care plan was discussed with Dr. Dennise and bedside RN  Time spent: 80 minutes  Thank you for allowing the Palliative Medicine Team to assist in the care of this patient.     Stephane CHRISTELLA Palin, NP  Please contact Palliative Medicine Team phone at [phone] for questions and concerns.

## 2023-12-08 DIAGNOSIS — G9341 Metabolic encephalopathy: Secondary | ICD-10-CM | POA: Diagnosis not present

## 2023-12-08 MED ORDER — LOSARTAN POTASSIUM 50 MG PO TABS: 50.0000 mg | ORAL_TABLET | Freq: Every day | ORAL | Status: AC

## 2023-12-08 MED ORDER — INSULIN ASPART 100 UNIT/ML IJ SOLN: INTRAMUSCULAR | Status: DC

## 2023-12-08 MED ORDER — SODIUM ZIRCONIUM CYCLOSILICATE 10 G PO PACK
10.0000 g | PACK | Freq: Once | ORAL | Status: AC
  Administered 2023-12-08: 10 g via ORAL
  Filled 2023-12-08: qty 1

## 2023-12-08 MED ORDER — AMLODIPINE BESYLATE 10 MG PO TABS
10.0000 mg | ORAL_TABLET | Freq: Every day | ORAL | Status: AC
Start: 2023-12-08 — End: ?

## 2023-12-08 NOTE — Progress Notes (Signed)
 REPORT CALLED TO [facility]. REPORTED TO DEMETRIA, LPN.

## 2023-12-08 NOTE — Discharge Instructions (Signed)
 Follow with Primary MD Albina GORMAN Dine, MD in 7 days   Get CBC, CMP, Magnesium , 2 view Chest X ray -  checked next visit with your primary MD or SNF MD    Activity: As tolerated with Full fall precautions use walker/cane & assistance as needed  Disposition SNF  Diet: Heart Healthy low carbohydrate diet, 1.5 L fluid restriction per day.  Check CBGs q. ACHS.  Special Instructions: If you have smoked or chewed Tobacco  in the last 2 yrs please stop smoking, stop any regular Alcohol   and or any Recreational drug use.  On your next visit with your primary care physician please Get Medicines reviewed and adjusted.  Please request your Prim.MD to go over all Hospital Tests and Procedure/Radiological results at the follow up, please get all Hospital records sent to your Prim MD by signing hospital release before you go home.  If you experience worsening of your admission symptoms, develop shortness of breath, life threatening emergency, suicidal or homicidal thoughts you must seek medical attention immediately by calling 911 or calling your MD immediately  if symptoms less severe.  You Must read complete instructions/literature along with all the possible adverse reactions/side effects for all the Medicines you take and that have been prescribed to you. Take any new Medicines after you have completely understood and accpet all the possible adverse reactions/side effects.   Do not drive when taking Pain medications.  Do not take more than prescribed Pain, Sleep and Anxiety Medications  Wear Seat belts while driving.

## 2023-12-08 NOTE — TOC Transition Note (Signed)
 Transition of Care [facility]) - Discharge Note   Patient Details  Name: Elizabeth [name] MRN: [id number] Date of Birth: 05-21-1940  Transition [facility]) CM/SW Contact:  Elizabeth Cordella Simmonds, LCSW Phone Number: 12/08/2023, 3:15 PM   Clinical Narrative:   Pt discharging to [facility], room 114A.  RN call report to [phone].  PTAR called [phone].     Final next level of care: Skilled Nursing Facility Barriers to Discharge: No Barriers Identified   Patient Goals and CMS Choice Patient states their goals for this hospitalization and ongoing recovery are:: walking   Choice offered to / list presented to : Patient (pt from [facility], does want to return)      Discharge Placement              Patient chooses bed at:  [facility]) Patient to be transferred to facility by: ptar Name of family member notified: daughter Diedre Patient and family notified of of transfer: 12/08/23  Discharge Plan and Services Additional resources added to the After Visit Summary for   In-house Referral: Clinical Social Work   Post Acute Care Choice: Skilled Nursing Facility                               Social Drivers of Health (SDOH) Interventions SDOH Screenings   Food Insecurity: Patient Declined (12/06/2023)  Housing: Unknown (12/06/2023)  Transportation Needs: Patient Declined (12/06/2023)  Utilities: Patient Declined (12/06/2023)  Social Connections: Patient Declined (12/06/2023)  Tobacco Use: Medium Risk (12/05/2023)     Readmission Risk Interventions    04/28/2023   11:56 AM  Readmission Risk Prevention Plan  Transportation Screening Complete  PCP or Specialist Appt within 5-7 Days Complete  Home Care Screening Complete  Medication Review (RN CM) Complete

## 2023-12-08 NOTE — Plan of Care (Signed)
  Problem: Clinical Measurements: Goal: Diagnostic test results will improve Outcome: Progressing Goal: Signs and symptoms of infection will decrease Outcome: Progressing   Problem: Respiratory: Goal: Ability to maintain adequate ventilation will improve Outcome: Progressing   Problem: Education: Goal: Knowledge of General Education information will improve Description: Including pain rating scale, medication(s)/side effects and non-pharmacologic comfort measures Outcome: Progressing   Problem: Clinical Measurements: Goal: Will remain free from infection Outcome: Progressing Goal: Diagnostic test results will improve Outcome: Progressing Goal: Respiratory complications will improve Outcome: Progressing

## 2023-12-08 NOTE — Discharge Summary (Signed)
 Elizabeth [name] FMW:[id number] DOB: 21-Jun-1940 DOA: 12/05/2023  PCP: Albina GORMAN Dine, MD  Admit date: 12/05/2023  Discharge date: 12/08/2023  Admitted From: SNF   Disposition:  SNF   Recommendations for Outpatient Follow-up:   Follow up with PCP in 1-2 weeks  PCP Please obtain BMP/CBC, 2 view CXR in 1week,  (see Discharge instructions)   PCP Please follow up on the following pending results:    Home Health: None   Equipment/Devices: None  Consultations: None  Discharge Condition: Stable    CODE STATUS: Full    Diet Recommendation: Heart Healthy low carbohydrate diet, 1.5 L fluid restriction per day.  Check CBGs q. ACHS.    Chief Complaint  Patient presents with   Altered Mental Status    Per facility pt is normally alert and oriented, started today pt is AMS      Brief history of present illness from the day of admission and additional interim summary    83 y.o. female with medical history significant of hypertension, HFpEF, chronic venous insufficiency, diabetes mellitus type 2, reactive airway disease, chronic hypercapnic respiratory failure, memory impairment, obstructive sleep apnea not compliant with CPAP, and GERD presents with lethargy and respiratory distress.  She is accompanied by her daughter who is present at bedside and gives most of the history.   She has been experiencing increased lethargy and respiratory distress. Her daughter notes that she has been more lethargic and unresponsive. She was previously treated for a urinary tract infection, but the medication did not seem effective as her symptoms persisted. The emergency department advised her daughter to bring her back to the hospital.  In the ER she was diagnosed with a UTI, constipation, dehydration and admitted to the hospital.                                                                  Hospital Course   Acute metabolic encephalopathy due to sepsis from UTI present on admission along with mild hypercapnia.  She was adequately treated with initially BiPAP, IV antibiotics, no headache, no focal deficits, mentation close to baseline, cultures negative thus far, finished antibiotic regimen, discontinue Jardiance  as she seems to be having recurrent UTIs, continue nighttime CPAP at SNF, is now symptom-free and back to her baseline.SABRA   Respiratory failure with hypercapnia and hypoxia - Reactive airway disease - Acute on chronic.  Patient noted was noted to be hypercapnic with initial PCO2 73.9 on venous blood gas. Continue CPAP at night while here and at SNF, of note patient is noncompliant with her CPAP has been counseled, clinically hypercapnia and hypoxia have resolved.    SIRS/sepsis - Urinary tract infection kindly see above   Heart failure with preserved ejection fraction Acute on chronic.  On physical exam patient has mild lower  extremity swelling.  Chest x-ray noted signs of CHF with left-sided pleural effusion. Last echocardiogram noted EF to be 55 to 60% with grade 1 diastolic dysfunction when checked on 09/15/2023.  Resume home diuretic regimen except Jardiance  as below.   Acute renal insufficiency with hyperkalemia - she was hydrated, ARB and Jardiance  were held, renal function back to baseline, has received a few doses of Lokelma , will give another dose today, potassium within normal limits, Jardiance  discontinued due to recurrent UTIs, ARB cut in half, continue home diuretics now along with potassium supplement.  Monitor BMP and potassium levels closely at SNF.   Macrocytic anemia Chronic.  Hemoglobin noted to be 9.4 which appears slightly improved from when discharged from previous hospitalization on 8/10 of 8.8.  Monitor.  Type screen also done.   Paroxysmal atrial fibrillation on chronic anticoagulation Patient appears to  be in a sinus rhythm at this time. Continue Eliquis    Controlled diabetes mellitus type 2 Last available hemoglobin A1c noted to be 4.5 when checked on 09/15/2023.  Continue Jardiance  due to recurrent UTIs, sliding scale with q. ACHS CBGs.   Essential hypertension Blood pressure improved regimen adjusted as below.   Mild memory impairment - Delirium precautions, Continue Aricept    GERD - Continue PPI   OSA - must wear CPAP nightly here and at SNF.   Constipation -stable  with bowel regimen.  Continue to monitor at SNF.   Morbid obesity -  BMI 45.41 kg/m, follow-up with PCP    Discharge diagnosis     Principal Problem:   Acute metabolic encephalopathy Active Problems:   Acute respiratory failure with hypoxia and hypercapnia (HCC)   SIRS (systemic inflammatory response syndrome) (HCC)   Acute on chronic diastolic CHF (congestive heart failure) (HCC)   Renal insufficiency   Macrocytic anemia   Paroxysmal atrial fibrillation (HCC)   Type 2 diabetes mellitus without complication, without long-term current use of insulin  (HCC)   Essential hypertension   Memory impairment   GERD (gastroesophageal reflux disease)   OSA (obstructive sleep apnea)   Morbid obesity (HCC)   Pressure injury of skin    Discharge instructions    Discharge Instructions     Discharge instructions   Complete by: As directed    Follow with Primary MD Albina GORMAN Dine, MD in 7 days   Get CBC, CMP, Magnesium , 2 view Chest X ray -  checked next visit with your primary MD or SNF MD    Activity: As tolerated with Full fall precautions use walker/cane & assistance as needed  Disposition SNF  Diet: Heart Healthy low carbohydrate diet, 1.5 L fluid restriction per day.  Check CBGs q. ACHS.  Special Instructions: If you have smoked or chewed Tobacco  in the last 2 yrs please stop smoking, stop any regular Alcohol   and or any Recreational drug use.  On your next visit with your primary care physician  please Get Medicines reviewed and adjusted.  Please request your Prim.MD to go over all Hospital Tests and Procedure/Radiological results at the follow up, please get all Hospital records sent to your Prim MD by signing hospital release before you go home.  If you experience worsening of your admission symptoms, develop shortness of breath, life threatening emergency, suicidal or homicidal thoughts you must seek medical attention immediately by calling 911 or calling your MD immediately  if symptoms less severe.  You Must read complete instructions/literature along with all the possible adverse reactions/side effects for all the Medicines you take  and that have been prescribed to you. Take any new Medicines after you have completely understood and accpet all the possible adverse reactions/side effects.   Do not drive when taking Pain medications.  Do not take more than prescribed Pain, Sleep and Anxiety Medications  Wear Seat belts while driving.   Increase activity slowly   Complete by: As directed    No wound care   Complete by: As directed        Discharge Medications   Allergies as of 12/08/2023   No Known Allergies      Medication List     STOP taking these medications    cefdinir 300 MG capsule Commonly known as: OMNICEF   cephALEXin  500 MG capsule Commonly known as: KEFLEX    gabapentin  100 MG capsule Commonly known as: NEURONTIN    Jardiance  10 MG Tabs tablet Generic drug: empagliflozin    lidocaine  5 % Commonly known as: LIDODERM        TAKE these medications    acetaminophen  500 MG tablet Commonly known as: TYLENOL  Take 500 mg by mouth 3 (three) times daily.   albuterol  108 (90 Base) MCG/ACT inhaler Commonly known as: VENTOLIN  HFA Inhale 2 puffs into the lungs every 6 (six) hours as needed for shortness of breath.   albuterol  (2.5 MG/3ML) 0.083% nebulizer solution Commonly known as: PROVENTIL  Take 2.5 mg by nebulization every 6 (six) hours.    amLODipine  10 MG tablet Commonly known as: NORVASC  Take 1 tablet (10 mg total) by mouth daily.   apixaban  5 MG Tabs tablet Commonly known as: ELIQUIS  Take 1 tablet (5 mg total) by mouth 2 (two) times daily.   BIOFREEZE COOL THE PAIN EX Apply 1 application  topically in the morning and at bedtime. Apply to both hands, ankles   bisacodyl  5 MG EC tablet Commonly known as: DULCOLAX Take 2 tablets (10 mg total) by mouth daily as needed for moderate constipation or severe constipation.   CENTRUM SILVER PO Take 1 tablet by mouth daily.   Cetirizine  HCl 10 MG Caps Take 10 mg by mouth daily.   cyanocobalamin  1000 MCG tablet Commonly known as: VITAMIN B12 Take 1,000 mcg by mouth daily.   diclofenac  Sodium 1 % Gel Commonly known as: VOLTAREN  Apply 4 g topically 4 (four) times daily. Apply to bilateral knees   donepezil  5 MG tablet Commonly known as: ARICEPT  Take 1 tablet (5 mg total) by mouth daily. What changed: when to take this   fluticasone  50 MCG/ACT nasal spray Commonly known as: FLONASE  PLACE 1 SPRAY INTO BOTH NOSTRILS 2 (TWO) TIMES DAILY   fluticasone -salmeterol 250-50 MCG/ACT Aepb Commonly known as: ADVAIR Inhale 1 puff into the lungs in the morning and at bedtime.   furosemide  40 MG tablet Commonly known as: Lasix  Take 1 tablet (40 mg total) by mouth daily as needed. Weigh yourself daily.  Take one dose by mouth if your weight increases by more than 2 pounds in one day or 5 pounds in one week.   hydrALAZINE  50 MG tablet Commonly known as: APRESOLINE  Take 50 mg by mouth every 6 (six) hours.   insulin  aspart 100 UNIT/ML injection Commonly known as: novoLOG  Before each meal 3 times a day, 140-199 - 2 units, 200-250 - 4 units, 251-299 - 6 units,  300-349 - 8 units,  350 or above call your MD. What changed:  how much to take how to take this when to take this additional instructions   losartan  50 MG tablet Commonly known as: COZAAR   Take 1 tablet (50 mg total)  by mouth daily. What changed:  medication strength how much to take   melatonin 3 MG Tabs tablet Take 6 mg by mouth at bedtime.   oxybutynin  10 MG 24 hr tablet Commonly known as: DITROPAN -XL Take 10 mg by mouth daily.   OXYGEN  Inhale 2-3 L/min into the lungs See admin instructions. When using CPAP and night, bleeding O2 3L. Oxygen  via nasal cannula at 2 L/min as needed for shortness of breath or sats < 90%.   pantoprazole  40 MG tablet Commonly known as: PROTONIX  Take 40 mg by mouth daily.   potassium chloride  10 MEQ tablet Commonly known as: KLOR-CON  Take 10 mEq by mouth daily.   rosuvastatin  5 MG tablet Commonly known as: CRESTOR  Take 5 mg by mouth at bedtime.   senna 8.6 MG tablet Commonly known as: SENOKOT Take 17.2 mg by mouth daily.   sertraline  25 MG tablet Commonly known as: ZOLOFT  Take 25 mg by mouth daily.   sodium phosphate Enem Place 1 enema rectally once as needed (constipation).   Systane Complete 0.6 % Soln Generic drug: Propylene Glycol Place 1 drop into both eyes every 6 (six) hours as needed (dry eyes).   UNABLE TO FIND Inhale 1 Device into the lungs See admin instructions. Apply CPAP settings 10/5 with bleeding O2 3L into CPAP at bedtime and as needed at bedtime for shortness of breath, wheezing. Must document placement in progress note         Follow-up Information     Albina GORMAN Dine, MD. Schedule an appointment as soon as possible for a visit in 1 week(s).   Specialty: Internal Medicine Contact information: [address] [place] [address] [phone]                 Major procedures and Radiology Reports - PLEASE review detailed and final reports thoroughly  -        DG Chest Port 1 View Result Date: 12/06/2023 EXAM: 1 VIEW XRAY OF THE CHEST 12/06/2023 07:43:00 AM COMPARISON: 12/05/2023 CLINICAL HISTORY: 141880 SOB (shortness of breath) 141880. Reason for exam: pt order states SOB (shortness of breath) SOB  (shortness of breath) 141880. Reason for exam: pt order states SOB (shortness of breath) FINDINGS: LUNGS AND PLEURA: Mild interstitial edema bilateral veil-like opacification over the lungs concerning for posterior layering pleural effusions. No pneumothorax or consolidative change. HEART AND MEDIASTINUM: Stable enlargement of the cardiac silhouette. BONES AND SOFT TISSUES: No acute osseous abnormality. IMPRESSION: 1. Mild interstitial edema and bilateral veil-like opacification, concerning for posterior layering pleural effusions. 2. Stable enlargement of the cardiac silhouette. Electronically signed by: Waddell Calk MD 12/06/2023 07:49 AM EDT RP Workstation: [id number]   DG Chest Port 1 View Result Date: 12/05/2023 CLINICAL DATA:  Hypercarbia EXAM: PORTABLE CHEST 1 VIEW COMPARISON:  11/29/2023 FINDINGS: Cardiac shadow is enlarged in size. Central vascular congestion is noted. Left-sided effusion is noted. No focal infiltrate is noted. No bony abnormality is seen. IMPRESSION: CHF with left-sided effusion. Electronically Signed   By: Oneil Devonshire M.D.   On: 12/05/2023 02:11   DG Chest Portable 1 View Result Date: 11/29/2023 CLINICAL DATA:  Weakness EXAM: PORTABLE CHEST 1 VIEW COMPARISON:  09/18/2023 FINDINGS: The patient is moderately rotated on this examination. Previously noted pleural effusions have resolved. Trace perihilar pulmonary edema persists. No focal consolidation. No pneumothorax. Cardiac size is unchanged. No acute bone abnormality. IMPRESSION: 1. Resolved pleural effusions. 2. Trace perihilar pulmonary edema. Electronically Signed  By: Dorethia Molt M.D.   On: 11/29/2023 19:18    Micro Results    Recent Results (from the past 240 hours)  Resp panel by RT-PCR (RSV, Flu A&B, Covid) Anterior Nasal Swab     Status: None   Collection Time: 11/29/23  6:41 PM   Specimen: Anterior Nasal Swab  Result Value Ref Range Status   SARS Coronavirus 2 by RT PCR NEGATIVE NEGATIVE Final    Comment:  (NOTE) SARS-CoV-2 target nucleic acids are NOT DETECTED.  The SARS-CoV-2 RNA is generally detectable in upper respiratory specimens during the acute phase of infection. The lowest concentration of SARS-CoV-2 viral copies this assay can detect is 138 copies/mL. A negative result does not preclude SARS-Cov-2 infection and should not be used as the sole basis for treatment or other patient management decisions. A negative result may occur with  improper specimen collection/handling, submission of specimen other than nasopharyngeal swab, presence of viral mutation(s) within the areas targeted by this assay, and inadequate number of viral copies(<138 copies/mL). A negative result must be combined with clinical observations, patient history, and epidemiological information. The expected result is Negative.  Fact Sheet for Patients:  BloggerCourse.com  Fact Sheet for Healthcare Providers:  SeriousBroker.it  This test is no t yet approved or cleared by the [place]  FDA and  has been authorized for detection and/or diagnosis of SARS-CoV-2 by FDA under an Emergency Use Authorization (EUA). This EUA will remain  in effect (meaning this test can be used) for the duration of the COVID-19 declaration under Section 564(b)(1) of the Act, 21 U.S.C.section 360bbb-3(b)(1), unless the authorization is terminated  or revoked sooner.       Influenza A by PCR NEGATIVE NEGATIVE Final   Influenza B by PCR NEGATIVE NEGATIVE Final    Comment: (NOTE) The Xpert Xpress SARS-CoV-2/FLU/RSV plus assay is intended as an aid in the diagnosis of influenza from Nasopharyngeal swab specimens and should not be used as a sole basis for treatment. Nasal washings and aspirates are unacceptable for Xpert Xpress SARS-CoV-2/FLU/RSV testing.  Fact Sheet for Patients: BloggerCourse.com  Fact Sheet for Healthcare  Providers: SeriousBroker.it  This test is not yet approved or cleared by the [place]  FDA and has been authorized for detection and/or diagnosis of SARS-CoV-2 by FDA under an Emergency Use Authorization (EUA). This EUA will remain in effect (meaning this test can be used) for the duration of the COVID-19 declaration under Section 564(b)(1) of the Act, 21 U.S.C. section 360bbb-3(b)(1), unless the authorization is terminated or revoked.     Resp Syncytial Virus by PCR NEGATIVE NEGATIVE Final    Comment: (NOTE) Fact Sheet for Patients: BloggerCourse.com  Fact Sheet for Healthcare Providers: SeriousBroker.it  This test is not yet approved or cleared by the [place]  FDA and has been authorized for detection and/or diagnosis of SARS-CoV-2 by FDA under an Emergency Use Authorization (EUA). This EUA will remain in effect (meaning this test can be used) for the duration of the COVID-19 declaration under Section 564(b)(1) of the Act, 21 U.S.C. section 360bbb-3(b)(1), unless the authorization is terminated or revoked.  Performed at [facility], [address], [place], [place] [address]   Blood Culture (routine x 2)     Status: None   Collection Time: 11/29/23  6:41 PM   Specimen: BLOOD RIGHT ARM  Result Value Ref Range Status   Specimen Description   Final    BLOOD RIGHT ARM Performed at [facility], [address] [place], [place] [address]    Special Requests   Final    BOTTLES DRAWN AEROBIC AND ANAEROBIC Blood Culture adequate volume Performed at [facility], [address], [place], [place] [address]    Culture   Final    NO GROWTH 5 DAYS Performed at [facility], [address] [place], [place] [address]    Report Status 12/04/2023 FINAL  Final  Blood Culture (routine x 2)     Status: Abnormal   Collection Time: 11/29/23  7:25 PM    Specimen: BLOOD RIGHT ARM  Result Value Ref Range Status   Specimen Description   Final    BLOOD RIGHT ARM Performed at [facility], [address], [place], [place] [address]    Special Requests   Final    BOTTLES DRAWN AEROBIC AND ANAEROBIC Blood Culture results may not be optimal due to an inadequate volume of blood received in culture bottles Performed at [facility], [address], [place], [place] [address]    Culture  Setup Time   Final    GRAM POSITIVE COCCI IN CLUSTERS IN BOTH AEROBIC AND ANAEROBIC BOTTLES CRITICAL RESULT CALLED TO, READ BACK BY AND VERIFIED WITH: CHARGE RN GIA BONIS [id number] BY J RAZZAK, MT    Culture (A)  Final    STAPHYLOCOCCUS EPIDERMIDIS THE SIGNIFICANCE OF ISOLATING THIS ORGANISM FROM A SINGLE SET OF BLOOD CULTURES WHEN MULTIPLE SETS ARE DRAWN IS UNCERTAIN. PLEASE NOTIFY THE MICROBIOLOGY DEPARTMENT WITHIN ONE WEEK IF SPECIATION AND SENSITIVITIES ARE REQUIRED. Performed at [facility], [address] [place], [place] [address]    Report Status 12/02/2023 FINAL  Final  Blood Culture ID Panel (Reflexed)     Status: Abnormal   Collection Time: 11/29/23  7:25 PM  Result Value Ref Range Status   Enterococcus faecalis NOT DETECTED NOT DETECTED Final   Enterococcus Faecium NOT DETECTED NOT DETECTED Final   Listeria monocytogenes NOT DETECTED NOT DETECTED Final   Staphylococcus species DETECTED (A) NOT DETECTED Final    Comment: CRITICAL RESULT CALLED TO, READ BACK BY AND VERIFIED WITH: CHARGE RN GIA BONIS [id number] 1847 BY J RAZZAK, MT    Staphylococcus aureus (BCID) NOT DETECTED NOT DETECTED Final   Staphylococcus epidermidis DETECTED (A) NOT DETECTED Final    Comment: Methicillin (oxacillin) resistant coagulase negative staphylococcus. Possible blood culture contaminant (unless isolated from more than one blood culture draw or clinical case suggests pathogenicity). No antibiotic treatment is indicated for blood   culture contaminants. CRITICAL RESULT CALLED TO, READ BACK BY AND VERIFIED WITH: CHARGE RN GIA BONIS [id number] 1847 BY J RAZZAK, MT    Staphylococcus lugdunensis NOT DETECTED NOT DETECTED Final   Streptococcus species NOT DETECTED NOT DETECTED Final   Streptococcus agalactiae NOT DETECTED NOT DETECTED Final   Streptococcus pneumoniae NOT DETECTED NOT DETECTED Final   Streptococcus pyogenes NOT DETECTED NOT DETECTED Final   A.calcoaceticus-baumannii NOT DETECTED NOT DETECTED Final   Bacteroides fragilis NOT DETECTED NOT DETECTED Final   Enterobacterales NOT DETECTED NOT DETECTED Final   Enterobacter cloacae complex NOT DETECTED NOT DETECTED Final   Escherichia coli NOT DETECTED NOT DETECTED Final   Klebsiella aerogenes NOT DETECTED NOT DETECTED Final   Klebsiella oxytoca NOT DETECTED NOT DETECTED Final   Klebsiella pneumoniae NOT DETECTED NOT DETECTED Final   Proteus species NOT DETECTED NOT DETECTED Final   Salmonella species NOT DETECTED NOT DETECTED Final   Serratia marcescens NOT DETECTED NOT DETECTED Final  Haemophilus influenzae NOT DETECTED NOT DETECTED Final   Neisseria meningitidis NOT DETECTED NOT DETECTED Final   Pseudomonas aeruginosa NOT DETECTED NOT DETECTED Final   Stenotrophomonas maltophilia NOT DETECTED NOT DETECTED Final   Candida albicans NOT DETECTED NOT DETECTED Final   Candida auris NOT DETECTED NOT DETECTED Final   Candida glabrata NOT DETECTED NOT DETECTED Final   Candida krusei NOT DETECTED NOT DETECTED Final   Candida parapsilosis NOT DETECTED NOT DETECTED Final   Candida tropicalis NOT DETECTED NOT DETECTED Final   Cryptococcus neoformans/gattii NOT DETECTED NOT DETECTED Final   Methicillin resistance mecA/C DETECTED (A) NOT DETECTED Final    Comment: CRITICAL RESULT CALLED TO, READ BACK BY AND VERIFIED WITH: CHARGE RN GIA CRETA [id number] 1847 BY JINNY COMMON, MT Performed at [facility], [address] [place], [place] [address]   Urine Culture      Status: Abnormal   Collection Time: 11/29/23 10:13 PM   Specimen: Urine, Clean Catch  Result Value Ref Range Status   Specimen Description   Final    URINE, CLEAN CATCH Performed at [facility], [address] [place], [place] [address]    Special Requests   Final    NONE Performed at [facility], [address] [place], [place] [address]    Culture MULTIPLE SPECIES PRESENT, SUGGEST RECOLLECTION (A)  Final   Report Status 12/01/2023 FINAL  Final  Culture, blood (routine x 2)     Status: None   Collection Time: 11/30/23 10:00 PM   Specimen: BLOOD  Result Value Ref Range Status   Specimen Description   Final    BLOOD LEFT ANTECUBITAL Performed at [facility], [address], [place], [place] [address]    Special Requests   Final    BOTTLES DRAWN AEROBIC ONLY Blood Culture adequate volume Performed at [facility], [address] [place], [place] [address]    Culture   Final    NO GROWTH 5 DAYS Performed at [facility], [address], [place], [place] [address]    Report Status 12/06/2023 FINAL  Final  Culture, blood (routine x 2)     Status: None   Collection Time: 11/30/23 10:13 PM   Specimen: BLOOD LEFT FOREARM  Result Value Ref Range Status   Specimen Description   Final    BLOOD LEFT FOREARM Performed at [facility], [address] [place], [place] [address]    Special Requests   Final    BOTTLES DRAWN AEROBIC AND ANAEROBIC Blood Culture results may not be optimal due to an inadequate volume of blood received in culture bottles Performed at [facility], [address], [place], [place] [address]    Culture   Final    NO GROWTH 5 DAYS Performed at [facility], [address] [place], [place] [address]    Report Status 12/06/2023 FINAL  Final  Culture, blood (x 2)     Status: None (Preliminary result)   Collection Time: 12/05/23 12:45 AM    Specimen: BLOOD RIGHT HAND  Result Value Ref Range Status   Specimen Description BLOOD RIGHT HAND  Final   Special Requests   Final    BOTTLES DRAWN AEROBIC AND ANAEROBIC Blood Culture results may not be optimal due to an inadequate volume of blood received in culture bottles   Culture   Final    NO GROWTH 3 DAYS Performed at [facility], [address], [place], [place] [address]    Report Status PENDING  Incomplete  MRSA Next Gen by PCR, Nasal     Status: None   Collection Time: 12/05/23  6:49 PM   Specimen: Nasal Mucosa; Nasal Swab  Result Value Ref Range Status   MRSA by PCR Next Gen NOT DETECTED NOT DETECTED Final    Comment: (NOTE) The GeneXpert MRSA Assay (FDA approved for NASAL specimens only), is one component of a comprehensive MRSA colonization surveillance program. It is not intended to diagnose MRSA infection nor to guide or monitor treatment for MRSA infections. Test performance is not FDA approved in patients less than 21 years old. Performed at [facility] [facility], [address] [place], [place] [address]     Today   Subjective    Lauramae Kneisley today has no headache,no chest abdominal pain,no new weakness tingling or numbness, feels much better wants to go home today.   Objective   Blood pressure (!) 130/53, pulse (!) 57, temperature (!) 97.5 F (36.4 C), temperature source Oral, resp. rate 18, height 5' 4 (1.626 m), weight 117.5 kg, SpO2 100%.   Intake/Output Summary (Last 24 hours) at 12/08/2023 0932 Last data filed at 12/08/2023 0549 Gross per 24 hour  Intake --  Output 1125 ml  Net -1125 ml    Exam  Awake Alert, No new F.N deficits,    [place].AT,PERRAL Supple Neck,   Symmetrical Chest wall movement, Good air movement bilaterally, CTAB RRR,No Gallops,   +ve B.Sounds, Abd Soft, Non tender,  No Cyanosis, Clubbing or edema    Data Review   Recent Labs  Lab 12/05/23 0050 12/05/23 0058 12/05/23 0425 12/06/23 1103 12/07/23 0630  WBC  3.9*  --   --  6.2 4.4  HGB 9.4* 10.9*  10.9* 9.5* 8.6* 9.0*  HCT 34.6* 32.0*  32.0* 28.0* 29.8* 30.5*  PLT 162  --   --  162 138*  MCV 109.8*  --   --  103.1* 101.3*  MCH 29.8  --   --  29.8 29.9  MCHC 27.2*  --   --  28.9* 29.5*  RDW 14.5  --   --  14.2 14.2  LYMPHSABS 0.5*  --   --   --  1.2  MONOABS 0.4  --   --   --  0.6  EOSABS 0.0  --   --   --  0.2  BASOSABS 0.0  --   --   --  0.0    Recent Labs  Lab 12/05/23 0050 12/05/23 0058 12/05/23 0059 12/05/23 0424 12/05/23 0425 12/05/23 0925 12/05/23 0941 12/05/23 2132 12/06/23 1103 12/07/23 0630  NA 138 137  136  --   --  139  --   --   --  137 140  K 5.5* 5.5*  5.4*  --   --  5.1  --   --   --  4.7 5.1  CL 93* 97*  --   --   --   --   --   --  91* 94*  CO2 34*  --   --   --   --   --   --   --  37* 38*  ANIONGAP 11  --   --   --   --   --   --   --  9 8  GLUCOSE 118* 118*  --   --   --   --   --   --  113* 93  BUN 23 27*  --   --   --   --   --   --  28* 19  CREATININE 1.08* 1.30*  --   --   --   --   --   --  0.92 0.61  AST 11*  --   --   --   --   --   --   --   --   --   ALT 11  --   --   --   --   --   --   --   --   --   ALKPHOS 44  --   --   --   --   --   --   --   --   --   BILITOT 0.3  --   --   --   --   --   --   --   --   --   ALBUMIN 3.0*  --   --   --   --   --   --   --   --   --   CRP  --   --   --   --   --   --   --  1.7* 1.5*  --   PROCALCITON  --   --   --   --   --   --  <0.10  --  <0.10  --   LATICACIDVEN  --   --  0.5 0.3*  --   --   --   --   --   --   INR 1.1  --   --   --   --   --   --   --   --   --   BNP  --   --   --   --   --  391.2*  --   --   --   --   MG  --   --   --   --   --   --   --   --  1.6* 1.8  PHOS  --   --   --   --   --   --   --   --   --  2.9  CALCIUM  9.3  --   --   --   --   --   --   --  8.8* 9.3    Total Time in preparing paper work, data evaluation and todays exam - 35 minutes  Signature  -    Lavada Stank M.D on 12/08/2023 at 9:32 AM   -  To page go to  [email or web address]

## 2023-12-08 NOTE — TOC Initial Note (Signed)
 Transition of Care [facility]) - Initial/Assessment Note    Patient Details  Name: Elizabeth [name] MRN: [id number] Date of Birth: 18-Feb-1941  Transition of Care [facility]) CM/SW Contact:    Bridget Cordella Simmonds, LCSW Phone Number: 12/08/2023, 3:11 PM  Clinical Narrative:     CSW informed pt stable for DC.   Pt confirms she is from [facility], she does want to return.  Permission given to speak with daughter Diedre.  Pt aware of plan to DC back to [place] today.    CSW confirmed pt from LTC at from [facility], per [facility] pt able to return today.  PT rec for STR, CSW submitted auth request, per Donnamarie pt can return with SNF auth still pending.     SNF auth request submitted in [place], [id number].            Expected Discharge Plan: Skilled Nursing Facility Barriers to Discharge: No Barriers Identified   Patient Goals and CMS Choice Patient states their goals for this hospitalization and ongoing recovery are:: walking   Choice offered to / list presented to : Patient (pt from [facility], does want to return)      Expected Discharge Plan and Services In-house Referral: Clinical Social Work   Post Acute Care Choice: Skilled Nursing Facility Living arrangements for the past 2 months: Skilled Nursing Facility Expected Discharge Date: 12/08/23                                    Prior Living Arrangements/Services Living arrangements for the past 2 months: Skilled Nursing Facility Lives with:: Facility Resident Patient language and need for interpreter reviewed:: Yes Do you feel safe going back to the place where you live?: Yes      Need for Family Participation in Patient Care: Yes (Comment) Care giver support system in place?: Yes (comment) Current home services: Other (comment) (na) Criminal Activity/Legal Involvement Pertinent to Current Situation/Hospitalization: No - Comment as needed  Activities of Daily Living      Permission  Sought/Granted Permission sought to share information with : Family Supports Permission granted to share information with : Yes, Verbal Permission Granted  Share Information with NAME: daughter Diedre  Permission granted to share info w AGENCY: SNF        Emotional Assessment Appearance:: Appears stated age Attitude/Demeanor/Rapport: Engaged Affect (typically observed): Appropriate, Pleasant Orientation: : Oriented to Self, Oriented to Place, Oriented to  Time, Oriented to Situation      Admission diagnosis:  Somnolence [R40.0] Hypercarbia [R06.89] Acute respiratory failure with hypoxia and hypercapnia (HCC) [J96.01, J96.02] Patient Active Problem List   Diagnosis Date Noted   Acute respiratory failure with hypoxia and hypercapnia (HCC) 12/05/2023   Pressure injury of skin 12/05/2023   SIRS (systemic inflammatory response syndrome) (HCC) 12/05/2023   Renal insufficiency 12/05/2023   Macrocytic anemia 12/05/2023   Paroxysmal atrial fibrillation (HCC) 12/05/2023   Memory impairment 12/05/2023   Acute respiratory failure with hypercapnia (HCC) 09/17/2023   Hypomagnesemia 09/15/2023   Hypophosphatemia 09/15/2023   Acute metabolic encephalopathy 09/14/2023   Reactive airway disease with acute exacerbation 09/14/2023   Acute on chronic diastolic CHF (congestive heart failure) (HCC) 09/14/2023   Colitis 04/24/2023   Sepsis (HCC) 04/23/2023   (HFpEF) heart failure with preserved ejection fraction (HCC) 12/24/2022   Morbid obesity (HCC) 12/24/2022   OSA (obstructive sleep apnea) 03/28/2022   Diabetes mellitus without complication (HCC) 06/19/2021  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) 05/12/2021   COVID-19 virus infection 05/12/2021   Generalized weakness 05/12/2021   Type 2 diabetes mellitus without complication, without long-term current use of insulin  (HCC) 05/12/2021   Essential hypertension 05/12/2021   Asthma 05/12/2021   GERD (gastroesophageal  reflux disease)    Leukopenia    Chronic venous insufficiency 01/17/2017   Pain of left breast 01/17/2012   PCP:  Albina GORMAN Dine, MD Pharmacy:  No Pharmacies Listed    Social Drivers of Health (SDOH) Social History: SDOH Screenings   Food Insecurity: Patient Declined (12/06/2023)  Housing: Unknown (12/06/2023)  Transportation Needs: Patient Declined (12/06/2023)  Utilities: Patient Declined (12/06/2023)  Social Connections: Patient Declined (12/06/2023)  Tobacco Use: Medium Risk (12/05/2023)   SDOH Interventions:     Readmission Risk Interventions    04/28/2023   11:56 AM  Readmission Risk Prevention Plan  Transportation Screening Complete  PCP or Specialist Appt within 5-7 Days Complete  Home Care Screening Complete  Medication Review (RN CM) Complete

## 2023-12-09 DIAGNOSIS — I69919 Unspecified symptoms and signs involving cognitive functions following unspecified cerebrovascular disease: Secondary | ICD-10-CM | POA: Diagnosis not present

## 2023-12-09 DIAGNOSIS — R2681 Unsteadiness on feet: Secondary | ICD-10-CM | POA: Diagnosis not present

## 2023-12-09 DIAGNOSIS — I10 Essential (primary) hypertension: Secondary | ICD-10-CM | POA: Diagnosis not present

## 2023-12-09 DIAGNOSIS — R1310 Dysphagia, unspecified: Secondary | ICD-10-CM | POA: Diagnosis not present

## 2023-12-09 DIAGNOSIS — E119 Type 2 diabetes mellitus without complications: Secondary | ICD-10-CM | POA: Diagnosis not present

## 2023-12-09 DIAGNOSIS — J45909 Unspecified asthma, uncomplicated: Secondary | ICD-10-CM | POA: Diagnosis not present

## 2023-12-09 DIAGNOSIS — N189 Chronic kidney disease, unspecified: Secondary | ICD-10-CM | POA: Diagnosis not present

## 2023-12-09 DIAGNOSIS — I2510 Atherosclerotic heart disease of native coronary artery without angina pectoris: Secondary | ICD-10-CM | POA: Diagnosis not present

## 2023-12-09 DIAGNOSIS — G4733 Obstructive sleep apnea (adult) (pediatric): Secondary | ICD-10-CM | POA: Diagnosis not present

## 2023-12-09 DIAGNOSIS — R531 Weakness: Secondary | ICD-10-CM | POA: Diagnosis not present

## 2023-12-09 DIAGNOSIS — N390 Urinary tract infection, site not specified: Secondary | ICD-10-CM | POA: Diagnosis not present

## 2023-12-09 DIAGNOSIS — K5900 Constipation, unspecified: Secondary | ICD-10-CM | POA: Diagnosis not present

## 2023-12-09 DIAGNOSIS — I509 Heart failure, unspecified: Secondary | ICD-10-CM | POA: Diagnosis not present

## 2023-12-09 DIAGNOSIS — I5032 Chronic diastolic (congestive) heart failure: Secondary | ICD-10-CM | POA: Diagnosis not present

## 2023-12-09 DIAGNOSIS — G9341 Metabolic encephalopathy: Secondary | ICD-10-CM | POA: Diagnosis not present

## 2023-12-09 DIAGNOSIS — K219 Gastro-esophageal reflux disease without esophagitis: Secondary | ICD-10-CM | POA: Diagnosis not present

## 2023-12-09 DIAGNOSIS — Z741 Need for assistance with personal care: Secondary | ICD-10-CM | POA: Diagnosis not present

## 2023-12-09 DIAGNOSIS — I110 Hypertensive heart disease with heart failure: Secondary | ICD-10-CM | POA: Diagnosis not present

## 2023-12-10 DIAGNOSIS — N189 Chronic kidney disease, unspecified: Secondary | ICD-10-CM | POA: Diagnosis not present

## 2023-12-10 DIAGNOSIS — R2681 Unsteadiness on feet: Secondary | ICD-10-CM | POA: Diagnosis not present

## 2023-12-10 DIAGNOSIS — I69919 Unspecified symptoms and signs involving cognitive functions following unspecified cerebrovascular disease: Secondary | ICD-10-CM | POA: Diagnosis not present

## 2023-12-10 DIAGNOSIS — E119 Type 2 diabetes mellitus without complications: Secondary | ICD-10-CM | POA: Diagnosis not present

## 2023-12-10 DIAGNOSIS — I5032 Chronic diastolic (congestive) heart failure: Secondary | ICD-10-CM | POA: Diagnosis not present

## 2023-12-10 DIAGNOSIS — Z741 Need for assistance with personal care: Secondary | ICD-10-CM | POA: Diagnosis not present

## 2023-12-10 DIAGNOSIS — I509 Heart failure, unspecified: Secondary | ICD-10-CM | POA: Diagnosis not present

## 2023-12-10 DIAGNOSIS — G9341 Metabolic encephalopathy: Secondary | ICD-10-CM | POA: Diagnosis not present

## 2023-12-10 LAB — CULTURE, BLOOD (ROUTINE X 2): Culture: NO GROWTH

## 2023-12-11 DIAGNOSIS — E119 Type 2 diabetes mellitus without complications: Secondary | ICD-10-CM | POA: Diagnosis not present

## 2023-12-11 DIAGNOSIS — G4733 Obstructive sleep apnea (adult) (pediatric): Secondary | ICD-10-CM | POA: Diagnosis not present

## 2023-12-11 DIAGNOSIS — I5032 Chronic diastolic (congestive) heart failure: Secondary | ICD-10-CM | POA: Diagnosis not present

## 2023-12-11 DIAGNOSIS — Z741 Need for assistance with personal care: Secondary | ICD-10-CM | POA: Diagnosis not present

## 2023-12-11 DIAGNOSIS — N189 Chronic kidney disease, unspecified: Secondary | ICD-10-CM | POA: Diagnosis not present

## 2023-12-11 DIAGNOSIS — G9341 Metabolic encephalopathy: Secondary | ICD-10-CM | POA: Diagnosis not present

## 2023-12-11 DIAGNOSIS — N390 Urinary tract infection, site not specified: Secondary | ICD-10-CM | POA: Diagnosis not present

## 2023-12-11 DIAGNOSIS — I509 Heart failure, unspecified: Secondary | ICD-10-CM | POA: Diagnosis not present

## 2023-12-11 DIAGNOSIS — M6281 Muscle weakness (generalized): Secondary | ICD-10-CM | POA: Diagnosis not present

## 2023-12-11 DIAGNOSIS — I10 Essential (primary) hypertension: Secondary | ICD-10-CM | POA: Diagnosis not present

## 2023-12-11 DIAGNOSIS — I69919 Unspecified symptoms and signs involving cognitive functions following unspecified cerebrovascular disease: Secondary | ICD-10-CM | POA: Diagnosis not present

## 2023-12-11 DIAGNOSIS — R2681 Unsteadiness on feet: Secondary | ICD-10-CM | POA: Diagnosis not present

## 2023-12-11 DIAGNOSIS — I5030 Unspecified diastolic (congestive) heart failure: Secondary | ICD-10-CM | POA: Diagnosis not present

## 2023-12-12 DIAGNOSIS — G9341 Metabolic encephalopathy: Secondary | ICD-10-CM | POA: Diagnosis not present

## 2023-12-12 DIAGNOSIS — I5032 Chronic diastolic (congestive) heart failure: Secondary | ICD-10-CM | POA: Diagnosis not present

## 2023-12-12 DIAGNOSIS — I509 Heart failure, unspecified: Secondary | ICD-10-CM | POA: Diagnosis not present

## 2023-12-12 DIAGNOSIS — N189 Chronic kidney disease, unspecified: Secondary | ICD-10-CM | POA: Diagnosis not present

## 2023-12-12 DIAGNOSIS — E119 Type 2 diabetes mellitus without complications: Secondary | ICD-10-CM | POA: Diagnosis not present

## 2023-12-12 DIAGNOSIS — R2681 Unsteadiness on feet: Secondary | ICD-10-CM | POA: Diagnosis not present

## 2023-12-12 DIAGNOSIS — I69919 Unspecified symptoms and signs involving cognitive functions following unspecified cerebrovascular disease: Secondary | ICD-10-CM | POA: Diagnosis not present

## 2023-12-12 DIAGNOSIS — Z741 Need for assistance with personal care: Secondary | ICD-10-CM | POA: Diagnosis not present

## 2023-12-13 DIAGNOSIS — N189 Chronic kidney disease, unspecified: Secondary | ICD-10-CM | POA: Diagnosis not present

## 2023-12-13 DIAGNOSIS — I509 Heart failure, unspecified: Secondary | ICD-10-CM | POA: Diagnosis not present

## 2023-12-13 DIAGNOSIS — I69919 Unspecified symptoms and signs involving cognitive functions following unspecified cerebrovascular disease: Secondary | ICD-10-CM | POA: Diagnosis not present

## 2023-12-13 DIAGNOSIS — Z741 Need for assistance with personal care: Secondary | ICD-10-CM | POA: Diagnosis not present

## 2023-12-13 DIAGNOSIS — I5032 Chronic diastolic (congestive) heart failure: Secondary | ICD-10-CM | POA: Diagnosis not present

## 2023-12-13 DIAGNOSIS — M6281 Muscle weakness (generalized): Secondary | ICD-10-CM | POA: Diagnosis not present

## 2023-12-13 DIAGNOSIS — E119 Type 2 diabetes mellitus without complications: Secondary | ICD-10-CM | POA: Diagnosis not present

## 2023-12-13 DIAGNOSIS — R2681 Unsteadiness on feet: Secondary | ICD-10-CM | POA: Diagnosis not present

## 2023-12-13 DIAGNOSIS — G9341 Metabolic encephalopathy: Secondary | ICD-10-CM | POA: Diagnosis not present

## 2023-12-14 DIAGNOSIS — G9341 Metabolic encephalopathy: Secondary | ICD-10-CM | POA: Diagnosis not present

## 2023-12-14 DIAGNOSIS — R2681 Unsteadiness on feet: Secondary | ICD-10-CM | POA: Diagnosis not present

## 2023-12-14 DIAGNOSIS — Z741 Need for assistance with personal care: Secondary | ICD-10-CM | POA: Diagnosis not present

## 2023-12-14 DIAGNOSIS — I5032 Chronic diastolic (congestive) heart failure: Secondary | ICD-10-CM | POA: Diagnosis not present

## 2023-12-14 DIAGNOSIS — N189 Chronic kidney disease, unspecified: Secondary | ICD-10-CM | POA: Diagnosis not present

## 2023-12-14 DIAGNOSIS — E119 Type 2 diabetes mellitus without complications: Secondary | ICD-10-CM | POA: Diagnosis not present

## 2023-12-14 DIAGNOSIS — I69919 Unspecified symptoms and signs involving cognitive functions following unspecified cerebrovascular disease: Secondary | ICD-10-CM | POA: Diagnosis not present

## 2023-12-14 DIAGNOSIS — I509 Heart failure, unspecified: Secondary | ICD-10-CM | POA: Diagnosis not present

## 2023-12-15 DIAGNOSIS — N189 Chronic kidney disease, unspecified: Secondary | ICD-10-CM | POA: Diagnosis not present

## 2023-12-15 DIAGNOSIS — Z741 Need for assistance with personal care: Secondary | ICD-10-CM | POA: Diagnosis not present

## 2023-12-15 DIAGNOSIS — G9341 Metabolic encephalopathy: Secondary | ICD-10-CM | POA: Diagnosis not present

## 2023-12-15 DIAGNOSIS — I509 Heart failure, unspecified: Secondary | ICD-10-CM | POA: Diagnosis not present

## 2023-12-15 DIAGNOSIS — R2681 Unsteadiness on feet: Secondary | ICD-10-CM | POA: Diagnosis not present

## 2023-12-15 DIAGNOSIS — E119 Type 2 diabetes mellitus without complications: Secondary | ICD-10-CM | POA: Diagnosis not present

## 2023-12-15 DIAGNOSIS — I5032 Chronic diastolic (congestive) heart failure: Secondary | ICD-10-CM | POA: Diagnosis not present

## 2023-12-15 DIAGNOSIS — I69919 Unspecified symptoms and signs involving cognitive functions following unspecified cerebrovascular disease: Secondary | ICD-10-CM | POA: Diagnosis not present

## 2023-12-16 DIAGNOSIS — R2681 Unsteadiness on feet: Secondary | ICD-10-CM | POA: Diagnosis not present

## 2023-12-16 DIAGNOSIS — I5032 Chronic diastolic (congestive) heart failure: Secondary | ICD-10-CM | POA: Diagnosis not present

## 2023-12-16 DIAGNOSIS — I509 Heart failure, unspecified: Secondary | ICD-10-CM | POA: Diagnosis not present

## 2023-12-16 DIAGNOSIS — G9341 Metabolic encephalopathy: Secondary | ICD-10-CM | POA: Diagnosis not present

## 2023-12-16 DIAGNOSIS — N189 Chronic kidney disease, unspecified: Secondary | ICD-10-CM | POA: Diagnosis not present

## 2023-12-16 DIAGNOSIS — I69919 Unspecified symptoms and signs involving cognitive functions following unspecified cerebrovascular disease: Secondary | ICD-10-CM | POA: Diagnosis not present

## 2023-12-16 DIAGNOSIS — E119 Type 2 diabetes mellitus without complications: Secondary | ICD-10-CM | POA: Diagnosis not present

## 2023-12-16 DIAGNOSIS — Z741 Need for assistance with personal care: Secondary | ICD-10-CM | POA: Diagnosis not present

## 2023-12-17 ENCOUNTER — Ambulatory Visit: Admitting: Pulmonary Disease

## 2023-12-17 VITALS — BP 154/67 | HR 59 | Ht 63.0 in | Wt 240.0 lb

## 2023-12-17 DIAGNOSIS — J9691 Respiratory failure, unspecified with hypoxia: Secondary | ICD-10-CM

## 2023-12-17 DIAGNOSIS — R2681 Unsteadiness on feet: Secondary | ICD-10-CM | POA: Diagnosis not present

## 2023-12-17 DIAGNOSIS — I5032 Chronic diastolic (congestive) heart failure: Secondary | ICD-10-CM | POA: Diagnosis not present

## 2023-12-17 DIAGNOSIS — J9692 Respiratory failure, unspecified with hypercapnia: Secondary | ICD-10-CM

## 2023-12-17 DIAGNOSIS — Z87891 Personal history of nicotine dependence: Secondary | ICD-10-CM | POA: Diagnosis not present

## 2023-12-17 DIAGNOSIS — G4733 Obstructive sleep apnea (adult) (pediatric): Secondary | ICD-10-CM

## 2023-12-17 DIAGNOSIS — M6281 Muscle weakness (generalized): Secondary | ICD-10-CM | POA: Diagnosis not present

## 2023-12-17 DIAGNOSIS — I509 Heart failure, unspecified: Secondary | ICD-10-CM | POA: Diagnosis not present

## 2023-12-17 DIAGNOSIS — Z741 Need for assistance with personal care: Secondary | ICD-10-CM | POA: Diagnosis not present

## 2023-12-17 DIAGNOSIS — I69919 Unspecified symptoms and signs involving cognitive functions following unspecified cerebrovascular disease: Secondary | ICD-10-CM | POA: Diagnosis not present

## 2023-12-17 DIAGNOSIS — G9341 Metabolic encephalopathy: Secondary | ICD-10-CM | POA: Diagnosis not present

## 2023-12-17 DIAGNOSIS — E119 Type 2 diabetes mellitus without complications: Secondary | ICD-10-CM | POA: Diagnosis not present

## 2023-12-17 DIAGNOSIS — N189 Chronic kidney disease, unspecified: Secondary | ICD-10-CM | POA: Diagnosis not present

## 2023-12-17 NOTE — Patient Instructions (Addendum)
 Ensure that you are using CPAP every night  Oxygen  supplementation at 2 L around-the-clock  She should be on oxygen  when she is going to her doctors offices and other visitations  Graded activities as tolerated  Nebulization treatment as needed  It is important that she continue using CPAP every night to decrease the likelihood of ending back up in the hospital  Follow-up in 3 months I am going

## 2023-12-17 NOTE — Progress Notes (Unsigned)
 Elizabeth [name]    [id number]    [birth year]  Primary Care Physician:Tejan-Sie, GORMAN Dine, MD  Referring Physician: Albina GORMAN Dine, MD [address] [place],  [place] [address]  Chief complaint:   Patient being seen for obstructive sleep apnea In for follow-up  HPI:  Recently hospitalized for altered mental status, was found to have acute on chronic hypercapnic respiratory failure, improved with BiPAP use  She was discharged to skilled nursing facility  Unfortunately, she has not been using CPAP nightly, states that they have not been assisting her with using CPAP on a regular basis  She does use bronchodilators for shortness of breath  Limited with activities of daily living as she is not able to ambulate but working on getting stronger able to stand for a few minutes  She was accompanied by her daughter today  She does have obstructive sleep apnea for which she was prescribed CPAP previously An attempt to perform a titration study had to be aborted as patient could not go through with the study  Has been feeling relatively well since recent discharge  Denies any pain or discomfort  Limited with activities Only able to stand nursing  She has a history of chronic right-sided sinusitis  Has been on Symbicort, albuterol , Flonase   Quit over 58 years ago Outpatient Encounter Medications as of 12/17/2023  Medication Sig   acetaminophen  (TYLENOL ) 500 MG tablet Take 500 mg by mouth 3 (three) times daily.   albuterol  (PROVENTIL  HFA;VENTOLIN  HFA) 108 (90 BASE) MCG/ACT inhaler Inhale 2 puffs into the lungs every 6 (six) hours as needed for shortness of breath.   albuterol  (PROVENTIL ) (2.5 MG/3ML) 0.083% nebulizer solution Take 2.5 mg by nebulization every 6 (six) hours.   amLODipine  (NORVASC ) 10 MG tablet Take 1 tablet (10 mg total) by mouth daily.   apixaban  (ELIQUIS ) 5 MG TABS tablet Take 1 tablet (5 mg total) by mouth 2 (two) times daily.   bisacodyl  (DULCOLAX) 5  MG EC tablet Take 2 tablets (10 mg total) by mouth daily as needed for moderate constipation or severe constipation.   Cetirizine  HCl 10 MG CAPS Take 10 mg by mouth daily.   cyanocobalamin  (VITAMIN B12) 1000 MCG tablet Take 1,000 mcg by mouth daily.   diclofenac  Sodium (VOLTAREN ) 1 % GEL Apply 4 g topically 4 (four) times daily. Apply to bilateral knees   donepezil  (ARICEPT ) 5 MG tablet Take 1 tablet (5 mg total) by mouth daily. (Patient taking differently: Take 5 mg by mouth at bedtime.)   fluticasone  (FLONASE ) 50 MCG/ACT nasal spray PLACE 1 SPRAY INTO BOTH NOSTRILS 2 (TWO) TIMES DAILY   fluticasone -salmeterol (ADVAIR) 250-50 MCG/ACT AEPB Inhale 1 puff into the lungs in the morning and at bedtime.   furosemide  (LASIX ) 40 MG tablet Take 1 tablet (40 mg total) by mouth daily as needed. Weigh yourself daily.  Take one dose by mouth if your weight increases by more than 2 pounds in one day or 5 pounds in one week.   hydrALAZINE  (APRESOLINE ) 50 MG tablet Take 50 mg by mouth every 6 (six) hours.   insulin  aspart (NOVOLOG ) 100 UNIT/ML injection Before each meal 3 times a day, 140-199 - 2 units, 200-250 - 4 units, 251-299 - 6 units,  300-349 - 8 units,  350 or above call your MD.   losartan  (COZAAR ) 50 MG tablet Take 1 tablet (50 mg total) by mouth daily.   melatonin 3 MG TABS tablet Take 6 mg  by mouth at bedtime.   Menthol, Topical Analgesic, (BIOFREEZE COOL THE PAIN EX) Apply 1 application  topically in the morning and at bedtime. Apply to both hands, ankles   Multiple Vitamins-Minerals (CENTRUM SILVER PO) Take 1 tablet by mouth daily.   oxybutynin  (DITROPAN -XL) 10 MG 24 hr tablet Take 10 mg by mouth daily.   OXYGEN  Inhale 2-3 L/min into the lungs See admin instructions. When using CPAP and night, bleeding O2 3L. Oxygen  via nasal cannula at 2 L/min as needed for shortness of breath or sats < 90%.   pantoprazole  (PROTONIX ) 40 MG tablet Take 40 mg by mouth daily.   potassium chloride  (KLOR-CON ) 10 MEQ  tablet Take 10 mEq by mouth daily.   Propylene Glycol (SYSTANE COMPLETE) 0.6 % SOLN Place 1 drop into both eyes every 6 (six) hours as needed (dry eyes).   rosuvastatin  (CRESTOR ) 5 MG tablet Take 5 mg by mouth at bedtime.   senna (SENOKOT) 8.6 MG tablet Take 17.2 mg by mouth daily.   sertraline  (ZOLOFT ) 25 MG tablet Take 25 mg by mouth daily.   sodium phosphate (FLEET) ENEM Place 1 enema rectally once as needed (constipation).   UNABLE TO FIND Inhale 1 Device into the lungs See admin instructions. Apply CPAP settings 10/5 with bleeding O2 3L into CPAP at bedtime and as needed at bedtime for shortness of breath, wheezing. Must document placement in progress note   No facility-administered encounter medications on file as of 12/17/2023.    Allergies as of 12/17/2023   (No Known Allergies)    Past Medical History:  Diagnosis Date   (HFpEF) heart failure with preserved ejection fraction (HCC) 12/24/2022   Arthritis    Asthma    Breast mass in female    Bronchitis    Bronchitis    Constipation    Cough    GERD (gastroesophageal reflux disease)    Hypertension associated with diabetes (HCC)    Type 2 diabetes mellitus (HCC)    Wheezing     Past Surgical History:  Procedure Laterality Date   ABDOMINAL HYSTERECTOMY  1996   CYSTECTOMY  1980's   back of neck   THYROID  SURGERY  2001    Family History  Problem Relation Age of Onset   Diabetes Mother    Heart disease Mother    Cancer Maternal Uncle     Social History   Socioeconomic History   Marital status: Widowed    Spouse name: Not on file   Number of children: 4   Years of education: 76   Highest education level: Not on file  Occupational History   Not on file  Tobacco Use   Smoking status: Former    Current packs/day: 0.00    Types: Cigarettes    Quit date: 01/17/1964    Years since quitting: 59.9   Smokeless tobacco: Never  Vaping Use   Vaping status: Never Used  Substance and Sexual Activity   Alcohol  use:  No   Drug use: No   Sexual activity: Not on file  Other Topics Concern   Not on file  Social History Narrative   Right handed   Drinks caffeine   One floor home, son lives with her   Retried   Social Drivers of Health   Financial Resource Strain: Not on file  Food Insecurity: Patient Declined (12/06/2023)   Hunger Vital Sign    Worried About Running Out of Food in the Last Year: Patient declined    Ran Out  of Food in the Last Year: Patient declined  Transportation Needs: Patient Declined (12/06/2023)   PRAPARE - Administrator, Civil Service (Medical): Patient declined    Lack of Transportation (Non-Medical): Patient declined  Physical Activity: Not on file  Stress: Not on file  Social Connections: Patient Declined (12/06/2023)   Social Connection and Isolation Panel    Frequency of Communication with Friends and Family: Patient declined    Frequency of Social Gatherings with Friends and Family: Patient declined    Attends Religious Services: Patient declined    Database administrator or Organizations: Patient declined    Attends Banker Meetings: Patient declined    Marital Status: Patient declined  Intimate Partner Violence: Patient Declined (12/06/2023)   Humiliation, Afraid, Rape, and Kick questionnaire    Fear of Current or Ex-Partner: Patient declined    Emotionally Abused: Patient declined    Physically Abused: Patient declined    Sexually Abused: Patient declined    Review of Systems  Respiratory:  Positive for apnea. Negative for shortness of breath.   Psychiatric/Behavioral:  Positive for sleep disturbance.     There were no vitals filed for this visit.    Physical Exam Constitutional:      Appearance: She is obese.  HENT:     Head: Normocephalic.     Mouth/Throat:     Mouth: Mucous membranes are moist.  Cardiovascular:     Rate and Rhythm: Normal rate and regular rhythm.     Heart sounds: No murmur heard.    No friction rub.   Pulmonary:     Effort: No respiratory distress.     Breath sounds: No stridor. No wheezing or rhonchi.  Musculoskeletal:     Cervical back: No rigidity or tenderness.  Neurological:     Mental Status: She is alert.  Psychiatric:        Mood and Affect: Mood normal.    Data Reviewed: Sleep study reviewed  Recent hospital records reviewed  Recent discharge plans reviewed from recent hospitalization August 2025  Assessment:  Hypercapnic and hypoxemic respiratory failure Recent hospitalization for hypercapnic respiratory failure - She improved significantly with BiPAP treatment  Underlying history of obstructive sleep apnea - Does have a machine to use at home - Has not been using it regularly  Nocturnal desaturations  Previously scheduled for CPAP titration study - Was unable to complete a retitration  Plan/Recommendations: Follow-up in 3 months  Encouraged to use oxygen  around-the-clock at 2 L  Make sure she is keeping oxygen  on when she is going to appointments  Graded activities as tolerated  Nebulization treatments as needed  Important to use CPAP nightly  Discussed with patient and her daughter today, she will be talking with the nursing facility where patient resides to ensure that she is being assisted with a CPAP   Jennet Epley MD [facility] 12/17/2023, 9:05 AM  CC: Albina GORMAN Dine, MD

## 2023-12-18 DIAGNOSIS — J45909 Unspecified asthma, uncomplicated: Secondary | ICD-10-CM | POA: Diagnosis not present

## 2023-12-18 DIAGNOSIS — I509 Heart failure, unspecified: Secondary | ICD-10-CM | POA: Diagnosis not present

## 2023-12-18 DIAGNOSIS — N189 Chronic kidney disease, unspecified: Secondary | ICD-10-CM | POA: Diagnosis not present

## 2023-12-18 DIAGNOSIS — I110 Hypertensive heart disease with heart failure: Secondary | ICD-10-CM | POA: Diagnosis not present

## 2023-12-18 DIAGNOSIS — M6281 Muscle weakness (generalized): Secondary | ICD-10-CM | POA: Diagnosis not present

## 2023-12-18 DIAGNOSIS — K219 Gastro-esophageal reflux disease without esophagitis: Secondary | ICD-10-CM | POA: Diagnosis not present

## 2023-12-18 DIAGNOSIS — G9341 Metabolic encephalopathy: Secondary | ICD-10-CM | POA: Diagnosis not present

## 2023-12-18 DIAGNOSIS — Z741 Need for assistance with personal care: Secondary | ICD-10-CM | POA: Diagnosis not present

## 2023-12-18 DIAGNOSIS — E119 Type 2 diabetes mellitus without complications: Secondary | ICD-10-CM | POA: Diagnosis not present

## 2023-12-18 DIAGNOSIS — I2510 Atherosclerotic heart disease of native coronary artery without angina pectoris: Secondary | ICD-10-CM | POA: Diagnosis not present

## 2023-12-18 DIAGNOSIS — R2681 Unsteadiness on feet: Secondary | ICD-10-CM | POA: Diagnosis not present

## 2023-12-18 DIAGNOSIS — I69919 Unspecified symptoms and signs involving cognitive functions following unspecified cerebrovascular disease: Secondary | ICD-10-CM | POA: Diagnosis not present

## 2023-12-18 DIAGNOSIS — R413 Other amnesia: Secondary | ICD-10-CM | POA: Diagnosis not present

## 2023-12-18 DIAGNOSIS — G4733 Obstructive sleep apnea (adult) (pediatric): Secondary | ICD-10-CM | POA: Diagnosis not present

## 2023-12-18 DIAGNOSIS — I5032 Chronic diastolic (congestive) heart failure: Secondary | ICD-10-CM | POA: Diagnosis not present

## 2023-12-19 DIAGNOSIS — N189 Chronic kidney disease, unspecified: Secondary | ICD-10-CM | POA: Diagnosis not present

## 2023-12-19 DIAGNOSIS — Z741 Need for assistance with personal care: Secondary | ICD-10-CM | POA: Diagnosis not present

## 2023-12-19 DIAGNOSIS — G9341 Metabolic encephalopathy: Secondary | ICD-10-CM | POA: Diagnosis not present

## 2023-12-19 DIAGNOSIS — I509 Heart failure, unspecified: Secondary | ICD-10-CM | POA: Diagnosis not present

## 2023-12-19 DIAGNOSIS — E119 Type 2 diabetes mellitus without complications: Secondary | ICD-10-CM | POA: Diagnosis not present

## 2023-12-19 DIAGNOSIS — I5032 Chronic diastolic (congestive) heart failure: Secondary | ICD-10-CM | POA: Diagnosis not present

## 2023-12-19 DIAGNOSIS — M6281 Muscle weakness (generalized): Secondary | ICD-10-CM | POA: Diagnosis not present

## 2023-12-19 DIAGNOSIS — I69919 Unspecified symptoms and signs involving cognitive functions following unspecified cerebrovascular disease: Secondary | ICD-10-CM | POA: Diagnosis not present

## 2023-12-19 DIAGNOSIS — R2681 Unsteadiness on feet: Secondary | ICD-10-CM | POA: Diagnosis not present

## 2023-12-20 DIAGNOSIS — Z741 Need for assistance with personal care: Secondary | ICD-10-CM | POA: Diagnosis not present

## 2023-12-20 DIAGNOSIS — N189 Chronic kidney disease, unspecified: Secondary | ICD-10-CM | POA: Diagnosis not present

## 2023-12-20 DIAGNOSIS — I509 Heart failure, unspecified: Secondary | ICD-10-CM | POA: Diagnosis not present

## 2023-12-20 DIAGNOSIS — E119 Type 2 diabetes mellitus without complications: Secondary | ICD-10-CM | POA: Diagnosis not present

## 2023-12-20 DIAGNOSIS — R2681 Unsteadiness on feet: Secondary | ICD-10-CM | POA: Diagnosis not present

## 2023-12-20 DIAGNOSIS — G9341 Metabolic encephalopathy: Secondary | ICD-10-CM | POA: Diagnosis not present

## 2023-12-20 DIAGNOSIS — I5032 Chronic diastolic (congestive) heart failure: Secondary | ICD-10-CM | POA: Diagnosis not present

## 2023-12-20 DIAGNOSIS — I69919 Unspecified symptoms and signs involving cognitive functions following unspecified cerebrovascular disease: Secondary | ICD-10-CM | POA: Diagnosis not present

## 2023-12-21 DIAGNOSIS — N189 Chronic kidney disease, unspecified: Secondary | ICD-10-CM | POA: Diagnosis not present

## 2023-12-21 DIAGNOSIS — I69919 Unspecified symptoms and signs involving cognitive functions following unspecified cerebrovascular disease: Secondary | ICD-10-CM | POA: Diagnosis not present

## 2023-12-21 DIAGNOSIS — I509 Heart failure, unspecified: Secondary | ICD-10-CM | POA: Diagnosis not present

## 2023-12-21 DIAGNOSIS — G9341 Metabolic encephalopathy: Secondary | ICD-10-CM | POA: Diagnosis not present

## 2023-12-21 DIAGNOSIS — I5032 Chronic diastolic (congestive) heart failure: Secondary | ICD-10-CM | POA: Diagnosis not present

## 2023-12-21 DIAGNOSIS — Z741 Need for assistance with personal care: Secondary | ICD-10-CM | POA: Diagnosis not present

## 2023-12-21 DIAGNOSIS — E119 Type 2 diabetes mellitus without complications: Secondary | ICD-10-CM | POA: Diagnosis not present

## 2023-12-21 DIAGNOSIS — R2681 Unsteadiness on feet: Secondary | ICD-10-CM | POA: Diagnosis not present

## 2023-12-22 DIAGNOSIS — N189 Chronic kidney disease, unspecified: Secondary | ICD-10-CM | POA: Diagnosis not present

## 2023-12-22 DIAGNOSIS — I509 Heart failure, unspecified: Secondary | ICD-10-CM | POA: Diagnosis not present

## 2023-12-22 DIAGNOSIS — G9341 Metabolic encephalopathy: Secondary | ICD-10-CM | POA: Diagnosis not present

## 2023-12-22 DIAGNOSIS — R2681 Unsteadiness on feet: Secondary | ICD-10-CM | POA: Diagnosis not present

## 2023-12-22 DIAGNOSIS — I69919 Unspecified symptoms and signs involving cognitive functions following unspecified cerebrovascular disease: Secondary | ICD-10-CM | POA: Diagnosis not present

## 2023-12-22 DIAGNOSIS — M6281 Muscle weakness (generalized): Secondary | ICD-10-CM | POA: Diagnosis not present

## 2023-12-22 DIAGNOSIS — I5032 Chronic diastolic (congestive) heart failure: Secondary | ICD-10-CM | POA: Diagnosis not present

## 2023-12-22 DIAGNOSIS — Z741 Need for assistance with personal care: Secondary | ICD-10-CM | POA: Diagnosis not present

## 2023-12-22 DIAGNOSIS — E119 Type 2 diabetes mellitus without complications: Secondary | ICD-10-CM | POA: Diagnosis not present

## 2023-12-23 DIAGNOSIS — I509 Heart failure, unspecified: Secondary | ICD-10-CM | POA: Diagnosis not present

## 2023-12-23 DIAGNOSIS — G9341 Metabolic encephalopathy: Secondary | ICD-10-CM | POA: Diagnosis not present

## 2023-12-23 DIAGNOSIS — I5032 Chronic diastolic (congestive) heart failure: Secondary | ICD-10-CM | POA: Diagnosis not present

## 2023-12-23 DIAGNOSIS — M6281 Muscle weakness (generalized): Secondary | ICD-10-CM | POA: Diagnosis not present

## 2023-12-23 DIAGNOSIS — E119 Type 2 diabetes mellitus without complications: Secondary | ICD-10-CM | POA: Diagnosis not present

## 2023-12-23 DIAGNOSIS — Z741 Need for assistance with personal care: Secondary | ICD-10-CM | POA: Diagnosis not present

## 2023-12-23 DIAGNOSIS — R2681 Unsteadiness on feet: Secondary | ICD-10-CM | POA: Diagnosis not present

## 2023-12-23 DIAGNOSIS — I69919 Unspecified symptoms and signs involving cognitive functions following unspecified cerebrovascular disease: Secondary | ICD-10-CM | POA: Diagnosis not present

## 2023-12-23 DIAGNOSIS — N189 Chronic kidney disease, unspecified: Secondary | ICD-10-CM | POA: Diagnosis not present

## 2023-12-24 DIAGNOSIS — R2681 Unsteadiness on feet: Secondary | ICD-10-CM | POA: Diagnosis not present

## 2023-12-24 DIAGNOSIS — I2510 Atherosclerotic heart disease of native coronary artery without angina pectoris: Secondary | ICD-10-CM | POA: Diagnosis not present

## 2023-12-24 DIAGNOSIS — I69919 Unspecified symptoms and signs involving cognitive functions following unspecified cerebrovascular disease: Secondary | ICD-10-CM | POA: Diagnosis not present

## 2023-12-24 DIAGNOSIS — K219 Gastro-esophageal reflux disease without esophagitis: Secondary | ICD-10-CM | POA: Diagnosis not present

## 2023-12-24 DIAGNOSIS — N189 Chronic kidney disease, unspecified: Secondary | ICD-10-CM | POA: Diagnosis not present

## 2023-12-24 DIAGNOSIS — E119 Type 2 diabetes mellitus without complications: Secondary | ICD-10-CM | POA: Diagnosis not present

## 2023-12-24 DIAGNOSIS — I509 Heart failure, unspecified: Secondary | ICD-10-CM | POA: Diagnosis not present

## 2023-12-24 DIAGNOSIS — Z741 Need for assistance with personal care: Secondary | ICD-10-CM | POA: Diagnosis not present

## 2023-12-24 DIAGNOSIS — R413 Other amnesia: Secondary | ICD-10-CM | POA: Diagnosis not present

## 2023-12-24 DIAGNOSIS — I110 Hypertensive heart disease with heart failure: Secondary | ICD-10-CM | POA: Diagnosis not present

## 2023-12-24 DIAGNOSIS — M6281 Muscle weakness (generalized): Secondary | ICD-10-CM | POA: Diagnosis not present

## 2023-12-24 DIAGNOSIS — G9341 Metabolic encephalopathy: Secondary | ICD-10-CM | POA: Diagnosis not present

## 2023-12-24 DIAGNOSIS — G4733 Obstructive sleep apnea (adult) (pediatric): Secondary | ICD-10-CM | POA: Diagnosis not present

## 2023-12-24 DIAGNOSIS — I5032 Chronic diastolic (congestive) heart failure: Secondary | ICD-10-CM | POA: Diagnosis not present

## 2023-12-24 DIAGNOSIS — J45909 Unspecified asthma, uncomplicated: Secondary | ICD-10-CM | POA: Diagnosis not present

## 2023-12-25 DIAGNOSIS — N189 Chronic kidney disease, unspecified: Secondary | ICD-10-CM | POA: Diagnosis not present

## 2023-12-25 DIAGNOSIS — R2681 Unsteadiness on feet: Secondary | ICD-10-CM | POA: Diagnosis not present

## 2023-12-25 DIAGNOSIS — I509 Heart failure, unspecified: Secondary | ICD-10-CM | POA: Diagnosis not present

## 2023-12-25 DIAGNOSIS — I69919 Unspecified symptoms and signs involving cognitive functions following unspecified cerebrovascular disease: Secondary | ICD-10-CM | POA: Diagnosis not present

## 2023-12-25 DIAGNOSIS — I5032 Chronic diastolic (congestive) heart failure: Secondary | ICD-10-CM | POA: Diagnosis not present

## 2023-12-25 DIAGNOSIS — G9341 Metabolic encephalopathy: Secondary | ICD-10-CM | POA: Diagnosis not present

## 2023-12-25 DIAGNOSIS — E119 Type 2 diabetes mellitus without complications: Secondary | ICD-10-CM | POA: Diagnosis not present

## 2023-12-25 DIAGNOSIS — Z741 Need for assistance with personal care: Secondary | ICD-10-CM | POA: Diagnosis not present

## 2023-12-25 DIAGNOSIS — M6281 Muscle weakness (generalized): Secondary | ICD-10-CM | POA: Diagnosis not present

## 2023-12-26 DIAGNOSIS — J45909 Unspecified asthma, uncomplicated: Secondary | ICD-10-CM | POA: Diagnosis not present

## 2023-12-26 DIAGNOSIS — E119 Type 2 diabetes mellitus without complications: Secondary | ICD-10-CM | POA: Diagnosis not present

## 2023-12-26 DIAGNOSIS — G9341 Metabolic encephalopathy: Secondary | ICD-10-CM | POA: Diagnosis not present

## 2023-12-26 DIAGNOSIS — R413 Other amnesia: Secondary | ICD-10-CM | POA: Diagnosis not present

## 2023-12-26 DIAGNOSIS — M6281 Muscle weakness (generalized): Secondary | ICD-10-CM | POA: Diagnosis not present

## 2023-12-26 DIAGNOSIS — Z741 Need for assistance with personal care: Secondary | ICD-10-CM | POA: Diagnosis not present

## 2023-12-26 DIAGNOSIS — I110 Hypertensive heart disease with heart failure: Secondary | ICD-10-CM | POA: Diagnosis not present

## 2023-12-26 DIAGNOSIS — N189 Chronic kidney disease, unspecified: Secondary | ICD-10-CM | POA: Diagnosis not present

## 2023-12-26 DIAGNOSIS — R2681 Unsteadiness on feet: Secondary | ICD-10-CM | POA: Diagnosis not present

## 2023-12-26 DIAGNOSIS — G4733 Obstructive sleep apnea (adult) (pediatric): Secondary | ICD-10-CM | POA: Diagnosis not present

## 2023-12-26 DIAGNOSIS — I5032 Chronic diastolic (congestive) heart failure: Secondary | ICD-10-CM | POA: Diagnosis not present

## 2023-12-26 DIAGNOSIS — I2510 Atherosclerotic heart disease of native coronary artery without angina pectoris: Secondary | ICD-10-CM | POA: Diagnosis not present

## 2023-12-26 DIAGNOSIS — I69919 Unspecified symptoms and signs involving cognitive functions following unspecified cerebrovascular disease: Secondary | ICD-10-CM | POA: Diagnosis not present

## 2023-12-26 DIAGNOSIS — K219 Gastro-esophageal reflux disease without esophagitis: Secondary | ICD-10-CM | POA: Diagnosis not present

## 2023-12-26 DIAGNOSIS — I509 Heart failure, unspecified: Secondary | ICD-10-CM | POA: Diagnosis not present

## 2023-12-27 DIAGNOSIS — Z741 Need for assistance with personal care: Secondary | ICD-10-CM | POA: Diagnosis not present

## 2023-12-27 DIAGNOSIS — N189 Chronic kidney disease, unspecified: Secondary | ICD-10-CM | POA: Diagnosis not present

## 2023-12-27 DIAGNOSIS — G9341 Metabolic encephalopathy: Secondary | ICD-10-CM | POA: Diagnosis not present

## 2023-12-27 DIAGNOSIS — E119 Type 2 diabetes mellitus without complications: Secondary | ICD-10-CM | POA: Diagnosis not present

## 2023-12-27 DIAGNOSIS — M6281 Muscle weakness (generalized): Secondary | ICD-10-CM | POA: Diagnosis not present

## 2023-12-27 DIAGNOSIS — R2681 Unsteadiness on feet: Secondary | ICD-10-CM | POA: Diagnosis not present

## 2023-12-27 DIAGNOSIS — I69919 Unspecified symptoms and signs involving cognitive functions following unspecified cerebrovascular disease: Secondary | ICD-10-CM | POA: Diagnosis not present

## 2023-12-27 DIAGNOSIS — I5032 Chronic diastolic (congestive) heart failure: Secondary | ICD-10-CM | POA: Diagnosis not present

## 2023-12-27 DIAGNOSIS — I509 Heart failure, unspecified: Secondary | ICD-10-CM | POA: Diagnosis not present

## 2023-12-28 DIAGNOSIS — Z741 Need for assistance with personal care: Secondary | ICD-10-CM | POA: Diagnosis not present

## 2023-12-28 DIAGNOSIS — I509 Heart failure, unspecified: Secondary | ICD-10-CM | POA: Diagnosis not present

## 2023-12-28 DIAGNOSIS — G9341 Metabolic encephalopathy: Secondary | ICD-10-CM | POA: Diagnosis not present

## 2023-12-28 DIAGNOSIS — E119 Type 2 diabetes mellitus without complications: Secondary | ICD-10-CM | POA: Diagnosis not present

## 2023-12-28 DIAGNOSIS — R2681 Unsteadiness on feet: Secondary | ICD-10-CM | POA: Diagnosis not present

## 2023-12-28 DIAGNOSIS — M6281 Muscle weakness (generalized): Secondary | ICD-10-CM | POA: Diagnosis not present

## 2023-12-28 DIAGNOSIS — I5032 Chronic diastolic (congestive) heart failure: Secondary | ICD-10-CM | POA: Diagnosis not present

## 2023-12-28 DIAGNOSIS — N189 Chronic kidney disease, unspecified: Secondary | ICD-10-CM | POA: Diagnosis not present

## 2023-12-28 DIAGNOSIS — I69919 Unspecified symptoms and signs involving cognitive functions following unspecified cerebrovascular disease: Secondary | ICD-10-CM | POA: Diagnosis not present

## 2023-12-29 DIAGNOSIS — R2681 Unsteadiness on feet: Secondary | ICD-10-CM | POA: Diagnosis not present

## 2023-12-29 DIAGNOSIS — I5032 Chronic diastolic (congestive) heart failure: Secondary | ICD-10-CM | POA: Diagnosis not present

## 2023-12-29 DIAGNOSIS — G629 Polyneuropathy, unspecified: Secondary | ICD-10-CM | POA: Diagnosis not present

## 2023-12-29 DIAGNOSIS — N189 Chronic kidney disease, unspecified: Secondary | ICD-10-CM | POA: Diagnosis not present

## 2023-12-29 DIAGNOSIS — I10 Essential (primary) hypertension: Secondary | ICD-10-CM | POA: Diagnosis not present

## 2023-12-29 DIAGNOSIS — N179 Acute kidney failure, unspecified: Secondary | ICD-10-CM | POA: Diagnosis not present

## 2023-12-29 DIAGNOSIS — Z794 Long term (current) use of insulin: Secondary | ICD-10-CM | POA: Diagnosis not present

## 2023-12-29 DIAGNOSIS — M6281 Muscle weakness (generalized): Secondary | ICD-10-CM | POA: Diagnosis not present

## 2023-12-29 DIAGNOSIS — H903 Sensorineural hearing loss, bilateral: Secondary | ICD-10-CM | POA: Diagnosis not present

## 2023-12-29 DIAGNOSIS — E119 Type 2 diabetes mellitus without complications: Secondary | ICD-10-CM | POA: Diagnosis not present

## 2023-12-29 DIAGNOSIS — Z741 Need for assistance with personal care: Secondary | ICD-10-CM | POA: Diagnosis not present

## 2023-12-29 DIAGNOSIS — I509 Heart failure, unspecified: Secondary | ICD-10-CM | POA: Diagnosis not present

## 2023-12-29 DIAGNOSIS — R339 Retention of urine, unspecified: Secondary | ICD-10-CM | POA: Diagnosis not present

## 2023-12-29 DIAGNOSIS — I69919 Unspecified symptoms and signs involving cognitive functions following unspecified cerebrovascular disease: Secondary | ICD-10-CM | POA: Diagnosis not present

## 2023-12-29 DIAGNOSIS — R1310 Dysphagia, unspecified: Secondary | ICD-10-CM | POA: Diagnosis not present

## 2023-12-29 DIAGNOSIS — G9341 Metabolic encephalopathy: Secondary | ICD-10-CM | POA: Diagnosis not present

## 2023-12-29 DIAGNOSIS — I959 Hypotension, unspecified: Secondary | ICD-10-CM | POA: Diagnosis not present

## 2023-12-29 DIAGNOSIS — K5900 Constipation, unspecified: Secondary | ICD-10-CM | POA: Diagnosis not present

## 2023-12-30 DIAGNOSIS — R2681 Unsteadiness on feet: Secondary | ICD-10-CM | POA: Diagnosis not present

## 2023-12-30 DIAGNOSIS — M6281 Muscle weakness (generalized): Secondary | ICD-10-CM | POA: Diagnosis not present

## 2023-12-30 DIAGNOSIS — I509 Heart failure, unspecified: Secondary | ICD-10-CM | POA: Diagnosis not present

## 2023-12-30 DIAGNOSIS — G9341 Metabolic encephalopathy: Secondary | ICD-10-CM | POA: Diagnosis not present

## 2023-12-30 DIAGNOSIS — N189 Chronic kidney disease, unspecified: Secondary | ICD-10-CM | POA: Diagnosis not present

## 2023-12-30 DIAGNOSIS — Z741 Need for assistance with personal care: Secondary | ICD-10-CM | POA: Diagnosis not present

## 2023-12-30 DIAGNOSIS — I5032 Chronic diastolic (congestive) heart failure: Secondary | ICD-10-CM | POA: Diagnosis not present

## 2023-12-30 DIAGNOSIS — I69919 Unspecified symptoms and signs involving cognitive functions following unspecified cerebrovascular disease: Secondary | ICD-10-CM | POA: Diagnosis not present

## 2023-12-30 DIAGNOSIS — E119 Type 2 diabetes mellitus without complications: Secondary | ICD-10-CM | POA: Diagnosis not present

## 2023-12-31 DIAGNOSIS — G9341 Metabolic encephalopathy: Secondary | ICD-10-CM | POA: Diagnosis not present

## 2023-12-31 DIAGNOSIS — I69919 Unspecified symptoms and signs involving cognitive functions following unspecified cerebrovascular disease: Secondary | ICD-10-CM | POA: Diagnosis not present

## 2023-12-31 DIAGNOSIS — I509 Heart failure, unspecified: Secondary | ICD-10-CM | POA: Diagnosis not present

## 2023-12-31 DIAGNOSIS — R2681 Unsteadiness on feet: Secondary | ICD-10-CM | POA: Diagnosis not present

## 2023-12-31 DIAGNOSIS — Z741 Need for assistance with personal care: Secondary | ICD-10-CM | POA: Diagnosis not present

## 2023-12-31 DIAGNOSIS — E119 Type 2 diabetes mellitus without complications: Secondary | ICD-10-CM | POA: Diagnosis not present

## 2023-12-31 DIAGNOSIS — N189 Chronic kidney disease, unspecified: Secondary | ICD-10-CM | POA: Diagnosis not present

## 2023-12-31 DIAGNOSIS — I5032 Chronic diastolic (congestive) heart failure: Secondary | ICD-10-CM | POA: Diagnosis not present

## 2023-12-31 DIAGNOSIS — M6281 Muscle weakness (generalized): Secondary | ICD-10-CM | POA: Diagnosis not present

## 2024-01-01 DIAGNOSIS — I5032 Chronic diastolic (congestive) heart failure: Secondary | ICD-10-CM | POA: Diagnosis not present

## 2024-01-01 DIAGNOSIS — I10 Essential (primary) hypertension: Secondary | ICD-10-CM | POA: Diagnosis not present

## 2024-01-01 DIAGNOSIS — N189 Chronic kidney disease, unspecified: Secondary | ICD-10-CM | POA: Diagnosis not present

## 2024-01-01 DIAGNOSIS — Z741 Need for assistance with personal care: Secondary | ICD-10-CM | POA: Diagnosis not present

## 2024-01-01 DIAGNOSIS — I509 Heart failure, unspecified: Secondary | ICD-10-CM | POA: Diagnosis not present

## 2024-01-01 DIAGNOSIS — J45909 Unspecified asthma, uncomplicated: Secondary | ICD-10-CM | POA: Diagnosis not present

## 2024-01-01 DIAGNOSIS — R2681 Unsteadiness on feet: Secondary | ICD-10-CM | POA: Diagnosis not present

## 2024-01-01 DIAGNOSIS — E119 Type 2 diabetes mellitus without complications: Secondary | ICD-10-CM | POA: Diagnosis not present

## 2024-01-01 DIAGNOSIS — G4733 Obstructive sleep apnea (adult) (pediatric): Secondary | ICD-10-CM | POA: Diagnosis not present

## 2024-01-01 DIAGNOSIS — G9341 Metabolic encephalopathy: Secondary | ICD-10-CM | POA: Diagnosis not present

## 2024-01-01 DIAGNOSIS — M6281 Muscle weakness (generalized): Secondary | ICD-10-CM | POA: Diagnosis not present

## 2024-01-01 DIAGNOSIS — I2510 Atherosclerotic heart disease of native coronary artery without angina pectoris: Secondary | ICD-10-CM | POA: Diagnosis not present

## 2024-01-01 DIAGNOSIS — K219 Gastro-esophageal reflux disease without esophagitis: Secondary | ICD-10-CM | POA: Diagnosis not present

## 2024-01-01 DIAGNOSIS — I69919 Unspecified symptoms and signs involving cognitive functions following unspecified cerebrovascular disease: Secondary | ICD-10-CM | POA: Diagnosis not present

## 2024-01-01 DIAGNOSIS — R413 Other amnesia: Secondary | ICD-10-CM | POA: Diagnosis not present

## 2024-01-02 DIAGNOSIS — N189 Chronic kidney disease, unspecified: Secondary | ICD-10-CM | POA: Diagnosis not present

## 2024-01-02 DIAGNOSIS — I69919 Unspecified symptoms and signs involving cognitive functions following unspecified cerebrovascular disease: Secondary | ICD-10-CM | POA: Diagnosis not present

## 2024-01-02 DIAGNOSIS — Z741 Need for assistance with personal care: Secondary | ICD-10-CM | POA: Diagnosis not present

## 2024-01-02 DIAGNOSIS — M6281 Muscle weakness (generalized): Secondary | ICD-10-CM | POA: Diagnosis not present

## 2024-01-02 DIAGNOSIS — E119 Type 2 diabetes mellitus without complications: Secondary | ICD-10-CM | POA: Diagnosis not present

## 2024-01-02 DIAGNOSIS — R2681 Unsteadiness on feet: Secondary | ICD-10-CM | POA: Diagnosis not present

## 2024-01-02 DIAGNOSIS — G9341 Metabolic encephalopathy: Secondary | ICD-10-CM | POA: Diagnosis not present

## 2024-01-02 DIAGNOSIS — I5032 Chronic diastolic (congestive) heart failure: Secondary | ICD-10-CM | POA: Diagnosis not present

## 2024-01-02 DIAGNOSIS — I509 Heart failure, unspecified: Secondary | ICD-10-CM | POA: Diagnosis not present

## 2024-01-05 DIAGNOSIS — I69919 Unspecified symptoms and signs involving cognitive functions following unspecified cerebrovascular disease: Secondary | ICD-10-CM | POA: Diagnosis not present

## 2024-01-05 DIAGNOSIS — Z741 Need for assistance with personal care: Secondary | ICD-10-CM | POA: Diagnosis not present

## 2024-01-05 DIAGNOSIS — M6281 Muscle weakness (generalized): Secondary | ICD-10-CM | POA: Diagnosis not present

## 2024-01-05 DIAGNOSIS — N189 Chronic kidney disease, unspecified: Secondary | ICD-10-CM | POA: Diagnosis not present

## 2024-01-05 DIAGNOSIS — I5032 Chronic diastolic (congestive) heart failure: Secondary | ICD-10-CM | POA: Diagnosis not present

## 2024-01-05 DIAGNOSIS — R2681 Unsteadiness on feet: Secondary | ICD-10-CM | POA: Diagnosis not present

## 2024-01-05 DIAGNOSIS — I509 Heart failure, unspecified: Secondary | ICD-10-CM | POA: Diagnosis not present

## 2024-01-05 DIAGNOSIS — G9341 Metabolic encephalopathy: Secondary | ICD-10-CM | POA: Diagnosis not present

## 2024-01-05 DIAGNOSIS — E119 Type 2 diabetes mellitus without complications: Secondary | ICD-10-CM | POA: Diagnosis not present

## 2024-01-06 DIAGNOSIS — E119 Type 2 diabetes mellitus without complications: Secondary | ICD-10-CM | POA: Diagnosis not present

## 2024-01-06 DIAGNOSIS — I509 Heart failure, unspecified: Secondary | ICD-10-CM | POA: Diagnosis not present

## 2024-01-06 DIAGNOSIS — I69919 Unspecified symptoms and signs involving cognitive functions following unspecified cerebrovascular disease: Secondary | ICD-10-CM | POA: Diagnosis not present

## 2024-01-06 DIAGNOSIS — M6281 Muscle weakness (generalized): Secondary | ICD-10-CM | POA: Diagnosis not present

## 2024-01-06 DIAGNOSIS — N189 Chronic kidney disease, unspecified: Secondary | ICD-10-CM | POA: Diagnosis not present

## 2024-01-06 DIAGNOSIS — G9341 Metabolic encephalopathy: Secondary | ICD-10-CM | POA: Diagnosis not present

## 2024-01-06 DIAGNOSIS — I5032 Chronic diastolic (congestive) heart failure: Secondary | ICD-10-CM | POA: Diagnosis not present

## 2024-01-06 DIAGNOSIS — Z741 Need for assistance with personal care: Secondary | ICD-10-CM | POA: Diagnosis not present

## 2024-01-06 DIAGNOSIS — R2681 Unsteadiness on feet: Secondary | ICD-10-CM | POA: Diagnosis not present

## 2024-01-07 DIAGNOSIS — G9341 Metabolic encephalopathy: Secondary | ICD-10-CM | POA: Diagnosis not present

## 2024-01-07 DIAGNOSIS — J45909 Unspecified asthma, uncomplicated: Secondary | ICD-10-CM | POA: Diagnosis not present

## 2024-01-07 DIAGNOSIS — M6281 Muscle weakness (generalized): Secondary | ICD-10-CM | POA: Diagnosis not present

## 2024-01-07 DIAGNOSIS — Z741 Need for assistance with personal care: Secondary | ICD-10-CM | POA: Diagnosis not present

## 2024-01-07 DIAGNOSIS — I2510 Atherosclerotic heart disease of native coronary artery without angina pectoris: Secondary | ICD-10-CM | POA: Diagnosis not present

## 2024-01-07 DIAGNOSIS — I5032 Chronic diastolic (congestive) heart failure: Secondary | ICD-10-CM | POA: Diagnosis not present

## 2024-01-07 DIAGNOSIS — I509 Heart failure, unspecified: Secondary | ICD-10-CM | POA: Diagnosis not present

## 2024-01-07 DIAGNOSIS — R2681 Unsteadiness on feet: Secondary | ICD-10-CM | POA: Diagnosis not present

## 2024-01-07 DIAGNOSIS — I110 Hypertensive heart disease with heart failure: Secondary | ICD-10-CM | POA: Diagnosis not present

## 2024-01-07 DIAGNOSIS — E119 Type 2 diabetes mellitus without complications: Secondary | ICD-10-CM | POA: Diagnosis not present

## 2024-01-07 DIAGNOSIS — I69919 Unspecified symptoms and signs involving cognitive functions following unspecified cerebrovascular disease: Secondary | ICD-10-CM | POA: Diagnosis not present

## 2024-01-07 DIAGNOSIS — R413 Other amnesia: Secondary | ICD-10-CM | POA: Diagnosis not present

## 2024-01-07 DIAGNOSIS — G4733 Obstructive sleep apnea (adult) (pediatric): Secondary | ICD-10-CM | POA: Diagnosis not present

## 2024-01-07 DIAGNOSIS — K219 Gastro-esophageal reflux disease without esophagitis: Secondary | ICD-10-CM | POA: Diagnosis not present

## 2024-01-07 DIAGNOSIS — N189 Chronic kidney disease, unspecified: Secondary | ICD-10-CM | POA: Diagnosis not present

## 2024-01-08 DIAGNOSIS — I5030 Unspecified diastolic (congestive) heart failure: Secondary | ICD-10-CM | POA: Diagnosis not present

## 2024-01-08 DIAGNOSIS — Z741 Need for assistance with personal care: Secondary | ICD-10-CM | POA: Diagnosis not present

## 2024-01-08 DIAGNOSIS — M6281 Muscle weakness (generalized): Secondary | ICD-10-CM | POA: Diagnosis not present

## 2024-01-08 DIAGNOSIS — I10 Essential (primary) hypertension: Secondary | ICD-10-CM | POA: Diagnosis not present

## 2024-01-08 DIAGNOSIS — I69919 Unspecified symptoms and signs involving cognitive functions following unspecified cerebrovascular disease: Secondary | ICD-10-CM | POA: Diagnosis not present

## 2024-01-08 DIAGNOSIS — G9341 Metabolic encephalopathy: Secondary | ICD-10-CM | POA: Diagnosis not present

## 2024-01-08 DIAGNOSIS — N189 Chronic kidney disease, unspecified: Secondary | ICD-10-CM | POA: Diagnosis not present

## 2024-01-08 DIAGNOSIS — G4733 Obstructive sleep apnea (adult) (pediatric): Secondary | ICD-10-CM | POA: Diagnosis not present

## 2024-01-08 DIAGNOSIS — I509 Heart failure, unspecified: Secondary | ICD-10-CM | POA: Diagnosis not present

## 2024-01-08 DIAGNOSIS — R2681 Unsteadiness on feet: Secondary | ICD-10-CM | POA: Diagnosis not present

## 2024-01-08 DIAGNOSIS — E119 Type 2 diabetes mellitus without complications: Secondary | ICD-10-CM | POA: Diagnosis not present

## 2024-01-08 DIAGNOSIS — I5032 Chronic diastolic (congestive) heart failure: Secondary | ICD-10-CM | POA: Diagnosis not present

## 2024-01-09 DIAGNOSIS — Z741 Need for assistance with personal care: Secondary | ICD-10-CM | POA: Diagnosis not present

## 2024-01-09 DIAGNOSIS — N189 Chronic kidney disease, unspecified: Secondary | ICD-10-CM | POA: Diagnosis not present

## 2024-01-09 DIAGNOSIS — G9341 Metabolic encephalopathy: Secondary | ICD-10-CM | POA: Diagnosis not present

## 2024-01-09 DIAGNOSIS — K5900 Constipation, unspecified: Secondary | ICD-10-CM | POA: Diagnosis not present

## 2024-01-09 DIAGNOSIS — H903 Sensorineural hearing loss, bilateral: Secondary | ICD-10-CM | POA: Diagnosis not present

## 2024-01-09 DIAGNOSIS — I509 Heart failure, unspecified: Secondary | ICD-10-CM | POA: Diagnosis not present

## 2024-01-09 DIAGNOSIS — I5032 Chronic diastolic (congestive) heart failure: Secondary | ICD-10-CM | POA: Diagnosis not present

## 2024-01-09 DIAGNOSIS — N179 Acute kidney failure, unspecified: Secondary | ICD-10-CM | POA: Diagnosis not present

## 2024-01-09 DIAGNOSIS — I69919 Unspecified symptoms and signs involving cognitive functions following unspecified cerebrovascular disease: Secondary | ICD-10-CM | POA: Diagnosis not present

## 2024-01-09 DIAGNOSIS — I959 Hypotension, unspecified: Secondary | ICD-10-CM | POA: Diagnosis not present

## 2024-01-09 DIAGNOSIS — R1310 Dysphagia, unspecified: Secondary | ICD-10-CM | POA: Diagnosis not present

## 2024-01-09 DIAGNOSIS — I10 Essential (primary) hypertension: Secondary | ICD-10-CM | POA: Diagnosis not present

## 2024-01-09 DIAGNOSIS — Z794 Long term (current) use of insulin: Secondary | ICD-10-CM | POA: Diagnosis not present

## 2024-01-09 DIAGNOSIS — M6281 Muscle weakness (generalized): Secondary | ICD-10-CM | POA: Diagnosis not present

## 2024-01-09 DIAGNOSIS — R339 Retention of urine, unspecified: Secondary | ICD-10-CM | POA: Diagnosis not present

## 2024-01-09 DIAGNOSIS — G629 Polyneuropathy, unspecified: Secondary | ICD-10-CM | POA: Diagnosis not present

## 2024-01-09 DIAGNOSIS — R2681 Unsteadiness on feet: Secondary | ICD-10-CM | POA: Diagnosis not present

## 2024-01-09 DIAGNOSIS — E119 Type 2 diabetes mellitus without complications: Secondary | ICD-10-CM | POA: Diagnosis not present

## 2024-01-10 DIAGNOSIS — I5032 Chronic diastolic (congestive) heart failure: Secondary | ICD-10-CM | POA: Diagnosis not present

## 2024-01-10 DIAGNOSIS — N189 Chronic kidney disease, unspecified: Secondary | ICD-10-CM | POA: Diagnosis not present

## 2024-01-10 DIAGNOSIS — I509 Heart failure, unspecified: Secondary | ICD-10-CM | POA: Diagnosis not present

## 2024-01-10 DIAGNOSIS — G9341 Metabolic encephalopathy: Secondary | ICD-10-CM | POA: Diagnosis not present

## 2024-01-10 DIAGNOSIS — R2681 Unsteadiness on feet: Secondary | ICD-10-CM | POA: Diagnosis not present

## 2024-01-10 DIAGNOSIS — I69919 Unspecified symptoms and signs involving cognitive functions following unspecified cerebrovascular disease: Secondary | ICD-10-CM | POA: Diagnosis not present

## 2024-01-10 DIAGNOSIS — E119 Type 2 diabetes mellitus without complications: Secondary | ICD-10-CM | POA: Diagnosis not present

## 2024-01-10 DIAGNOSIS — Z741 Need for assistance with personal care: Secondary | ICD-10-CM | POA: Diagnosis not present

## 2024-01-10 DIAGNOSIS — M6281 Muscle weakness (generalized): Secondary | ICD-10-CM | POA: Diagnosis not present

## 2024-01-11 DIAGNOSIS — Z741 Need for assistance with personal care: Secondary | ICD-10-CM | POA: Diagnosis not present

## 2024-01-11 DIAGNOSIS — M6281 Muscle weakness (generalized): Secondary | ICD-10-CM | POA: Diagnosis not present

## 2024-01-11 DIAGNOSIS — I69919 Unspecified symptoms and signs involving cognitive functions following unspecified cerebrovascular disease: Secondary | ICD-10-CM | POA: Diagnosis not present

## 2024-01-11 DIAGNOSIS — I5032 Chronic diastolic (congestive) heart failure: Secondary | ICD-10-CM | POA: Diagnosis not present

## 2024-01-11 DIAGNOSIS — R2681 Unsteadiness on feet: Secondary | ICD-10-CM | POA: Diagnosis not present

## 2024-01-11 DIAGNOSIS — E119 Type 2 diabetes mellitus without complications: Secondary | ICD-10-CM | POA: Diagnosis not present

## 2024-01-11 DIAGNOSIS — G9341 Metabolic encephalopathy: Secondary | ICD-10-CM | POA: Diagnosis not present

## 2024-01-11 DIAGNOSIS — I509 Heart failure, unspecified: Secondary | ICD-10-CM | POA: Diagnosis not present

## 2024-01-11 DIAGNOSIS — N189 Chronic kidney disease, unspecified: Secondary | ICD-10-CM | POA: Diagnosis not present

## 2024-01-13 DIAGNOSIS — R2681 Unsteadiness on feet: Secondary | ICD-10-CM | POA: Diagnosis not present

## 2024-01-13 DIAGNOSIS — I5032 Chronic diastolic (congestive) heart failure: Secondary | ICD-10-CM | POA: Diagnosis not present

## 2024-01-13 DIAGNOSIS — E119 Type 2 diabetes mellitus without complications: Secondary | ICD-10-CM | POA: Diagnosis not present

## 2024-01-13 DIAGNOSIS — G9341 Metabolic encephalopathy: Secondary | ICD-10-CM | POA: Diagnosis not present

## 2024-01-13 DIAGNOSIS — N189 Chronic kidney disease, unspecified: Secondary | ICD-10-CM | POA: Diagnosis not present

## 2024-01-13 DIAGNOSIS — M6281 Muscle weakness (generalized): Secondary | ICD-10-CM | POA: Diagnosis not present

## 2024-01-13 DIAGNOSIS — I69919 Unspecified symptoms and signs involving cognitive functions following unspecified cerebrovascular disease: Secondary | ICD-10-CM | POA: Diagnosis not present

## 2024-01-13 DIAGNOSIS — Z741 Need for assistance with personal care: Secondary | ICD-10-CM | POA: Diagnosis not present

## 2024-01-13 DIAGNOSIS — I509 Heart failure, unspecified: Secondary | ICD-10-CM | POA: Diagnosis not present

## 2024-01-14 DIAGNOSIS — N189 Chronic kidney disease, unspecified: Secondary | ICD-10-CM | POA: Diagnosis not present

## 2024-01-14 DIAGNOSIS — Z794 Long term (current) use of insulin: Secondary | ICD-10-CM | POA: Diagnosis not present

## 2024-01-14 DIAGNOSIS — I69919 Unspecified symptoms and signs involving cognitive functions following unspecified cerebrovascular disease: Secondary | ICD-10-CM | POA: Diagnosis not present

## 2024-01-14 DIAGNOSIS — H903 Sensorineural hearing loss, bilateral: Secondary | ICD-10-CM | POA: Diagnosis not present

## 2024-01-14 DIAGNOSIS — I959 Hypotension, unspecified: Secondary | ICD-10-CM | POA: Diagnosis not present

## 2024-01-14 DIAGNOSIS — R2681 Unsteadiness on feet: Secondary | ICD-10-CM | POA: Diagnosis not present

## 2024-01-14 DIAGNOSIS — G9341 Metabolic encephalopathy: Secondary | ICD-10-CM | POA: Diagnosis not present

## 2024-01-14 DIAGNOSIS — G629 Polyneuropathy, unspecified: Secondary | ICD-10-CM | POA: Diagnosis not present

## 2024-01-14 DIAGNOSIS — E119 Type 2 diabetes mellitus without complications: Secondary | ICD-10-CM | POA: Diagnosis not present

## 2024-01-14 DIAGNOSIS — I509 Heart failure, unspecified: Secondary | ICD-10-CM | POA: Diagnosis not present

## 2024-01-14 DIAGNOSIS — I10 Essential (primary) hypertension: Secondary | ICD-10-CM | POA: Diagnosis not present

## 2024-01-14 DIAGNOSIS — N179 Acute kidney failure, unspecified: Secondary | ICD-10-CM | POA: Diagnosis not present

## 2024-01-14 DIAGNOSIS — R339 Retention of urine, unspecified: Secondary | ICD-10-CM | POA: Diagnosis not present

## 2024-01-14 DIAGNOSIS — R1310 Dysphagia, unspecified: Secondary | ICD-10-CM | POA: Diagnosis not present

## 2024-01-14 DIAGNOSIS — M6281 Muscle weakness (generalized): Secondary | ICD-10-CM | POA: Diagnosis not present

## 2024-01-14 DIAGNOSIS — I5032 Chronic diastolic (congestive) heart failure: Secondary | ICD-10-CM | POA: Diagnosis not present

## 2024-01-14 DIAGNOSIS — Z741 Need for assistance with personal care: Secondary | ICD-10-CM | POA: Diagnosis not present

## 2024-01-14 DIAGNOSIS — K5900 Constipation, unspecified: Secondary | ICD-10-CM | POA: Diagnosis not present

## 2024-01-15 DIAGNOSIS — M6281 Muscle weakness (generalized): Secondary | ICD-10-CM | POA: Diagnosis not present

## 2024-01-15 DIAGNOSIS — I110 Hypertensive heart disease with heart failure: Secondary | ICD-10-CM | POA: Diagnosis not present

## 2024-01-15 DIAGNOSIS — I69919 Unspecified symptoms and signs involving cognitive functions following unspecified cerebrovascular disease: Secondary | ICD-10-CM | POA: Diagnosis not present

## 2024-01-15 DIAGNOSIS — Z741 Need for assistance with personal care: Secondary | ICD-10-CM | POA: Diagnosis not present

## 2024-01-15 DIAGNOSIS — I509 Heart failure, unspecified: Secondary | ICD-10-CM | POA: Diagnosis not present

## 2024-01-15 DIAGNOSIS — N189 Chronic kidney disease, unspecified: Secondary | ICD-10-CM | POA: Diagnosis not present

## 2024-01-15 DIAGNOSIS — R413 Other amnesia: Secondary | ICD-10-CM | POA: Diagnosis not present

## 2024-01-15 DIAGNOSIS — K219 Gastro-esophageal reflux disease without esophagitis: Secondary | ICD-10-CM | POA: Diagnosis not present

## 2024-01-15 DIAGNOSIS — J45909 Unspecified asthma, uncomplicated: Secondary | ICD-10-CM | POA: Diagnosis not present

## 2024-01-15 DIAGNOSIS — E119 Type 2 diabetes mellitus without complications: Secondary | ICD-10-CM | POA: Diagnosis not present

## 2024-01-15 DIAGNOSIS — I2510 Atherosclerotic heart disease of native coronary artery without angina pectoris: Secondary | ICD-10-CM | POA: Diagnosis not present

## 2024-01-15 DIAGNOSIS — I5032 Chronic diastolic (congestive) heart failure: Secondary | ICD-10-CM | POA: Diagnosis not present

## 2024-01-15 DIAGNOSIS — G4733 Obstructive sleep apnea (adult) (pediatric): Secondary | ICD-10-CM | POA: Diagnosis not present

## 2024-01-15 DIAGNOSIS — G9341 Metabolic encephalopathy: Secondary | ICD-10-CM | POA: Diagnosis not present

## 2024-01-15 DIAGNOSIS — R2681 Unsteadiness on feet: Secondary | ICD-10-CM | POA: Diagnosis not present

## 2024-01-16 DIAGNOSIS — E119 Type 2 diabetes mellitus without complications: Secondary | ICD-10-CM | POA: Diagnosis not present

## 2024-01-16 DIAGNOSIS — I10 Essential (primary) hypertension: Secondary | ICD-10-CM | POA: Diagnosis not present

## 2024-01-16 DIAGNOSIS — N189 Chronic kidney disease, unspecified: Secondary | ICD-10-CM | POA: Diagnosis not present

## 2024-01-16 DIAGNOSIS — M6281 Muscle weakness (generalized): Secondary | ICD-10-CM | POA: Diagnosis not present

## 2024-01-16 DIAGNOSIS — R2681 Unsteadiness on feet: Secondary | ICD-10-CM | POA: Diagnosis not present

## 2024-01-16 DIAGNOSIS — I5032 Chronic diastolic (congestive) heart failure: Secondary | ICD-10-CM | POA: Diagnosis not present

## 2024-01-16 DIAGNOSIS — I69919 Unspecified symptoms and signs involving cognitive functions following unspecified cerebrovascular disease: Secondary | ICD-10-CM | POA: Diagnosis not present

## 2024-01-16 DIAGNOSIS — I509 Heart failure, unspecified: Secondary | ICD-10-CM | POA: Diagnosis not present

## 2024-01-16 DIAGNOSIS — G9341 Metabolic encephalopathy: Secondary | ICD-10-CM | POA: Diagnosis not present

## 2024-01-16 DIAGNOSIS — Z741 Need for assistance with personal care: Secondary | ICD-10-CM | POA: Diagnosis not present

## 2024-01-17 DIAGNOSIS — R2681 Unsteadiness on feet: Secondary | ICD-10-CM | POA: Diagnosis not present

## 2024-01-17 DIAGNOSIS — I5032 Chronic diastolic (congestive) heart failure: Secondary | ICD-10-CM | POA: Diagnosis not present

## 2024-01-17 DIAGNOSIS — Z741 Need for assistance with personal care: Secondary | ICD-10-CM | POA: Diagnosis not present

## 2024-01-17 DIAGNOSIS — N189 Chronic kidney disease, unspecified: Secondary | ICD-10-CM | POA: Diagnosis not present

## 2024-01-17 DIAGNOSIS — G9341 Metabolic encephalopathy: Secondary | ICD-10-CM | POA: Diagnosis not present

## 2024-01-17 DIAGNOSIS — I69919 Unspecified symptoms and signs involving cognitive functions following unspecified cerebrovascular disease: Secondary | ICD-10-CM | POA: Diagnosis not present

## 2024-01-17 DIAGNOSIS — M6281 Muscle weakness (generalized): Secondary | ICD-10-CM | POA: Diagnosis not present

## 2024-01-17 DIAGNOSIS — I509 Heart failure, unspecified: Secondary | ICD-10-CM | POA: Diagnosis not present

## 2024-01-17 DIAGNOSIS — E119 Type 2 diabetes mellitus without complications: Secondary | ICD-10-CM | POA: Diagnosis not present

## 2024-01-18 DIAGNOSIS — Z741 Need for assistance with personal care: Secondary | ICD-10-CM | POA: Diagnosis not present

## 2024-01-18 DIAGNOSIS — I2510 Atherosclerotic heart disease of native coronary artery without angina pectoris: Secondary | ICD-10-CM | POA: Diagnosis not present

## 2024-01-18 DIAGNOSIS — N189 Chronic kidney disease, unspecified: Secondary | ICD-10-CM | POA: Diagnosis not present

## 2024-01-18 DIAGNOSIS — G9341 Metabolic encephalopathy: Secondary | ICD-10-CM | POA: Diagnosis not present

## 2024-01-18 DIAGNOSIS — I5032 Chronic diastolic (congestive) heart failure: Secondary | ICD-10-CM | POA: Diagnosis not present

## 2024-01-18 DIAGNOSIS — E119 Type 2 diabetes mellitus without complications: Secondary | ICD-10-CM | POA: Diagnosis not present

## 2024-01-18 DIAGNOSIS — I69919 Unspecified symptoms and signs involving cognitive functions following unspecified cerebrovascular disease: Secondary | ICD-10-CM | POA: Diagnosis not present

## 2024-01-18 DIAGNOSIS — R2681 Unsteadiness on feet: Secondary | ICD-10-CM | POA: Diagnosis not present

## 2024-01-18 DIAGNOSIS — G4733 Obstructive sleep apnea (adult) (pediatric): Secondary | ICD-10-CM | POA: Diagnosis not present

## 2024-01-18 DIAGNOSIS — K219 Gastro-esophageal reflux disease without esophagitis: Secondary | ICD-10-CM | POA: Diagnosis not present

## 2024-01-18 DIAGNOSIS — J45909 Unspecified asthma, uncomplicated: Secondary | ICD-10-CM | POA: Diagnosis not present

## 2024-01-18 DIAGNOSIS — I509 Heart failure, unspecified: Secondary | ICD-10-CM | POA: Diagnosis not present

## 2024-01-18 DIAGNOSIS — I110 Hypertensive heart disease with heart failure: Secondary | ICD-10-CM | POA: Diagnosis not present

## 2024-01-18 DIAGNOSIS — M6281 Muscle weakness (generalized): Secondary | ICD-10-CM | POA: Diagnosis not present

## 2024-01-18 DIAGNOSIS — R413 Other amnesia: Secondary | ICD-10-CM | POA: Diagnosis not present

## 2024-01-20 DIAGNOSIS — E119 Type 2 diabetes mellitus without complications: Secondary | ICD-10-CM | POA: Diagnosis not present

## 2024-01-20 DIAGNOSIS — M6281 Muscle weakness (generalized): Secondary | ICD-10-CM | POA: Diagnosis not present

## 2024-01-20 DIAGNOSIS — N189 Chronic kidney disease, unspecified: Secondary | ICD-10-CM | POA: Diagnosis not present

## 2024-01-20 DIAGNOSIS — I509 Heart failure, unspecified: Secondary | ICD-10-CM | POA: Diagnosis not present

## 2024-01-20 DIAGNOSIS — I5032 Chronic diastolic (congestive) heart failure: Secondary | ICD-10-CM | POA: Diagnosis not present

## 2024-01-20 DIAGNOSIS — G9341 Metabolic encephalopathy: Secondary | ICD-10-CM | POA: Diagnosis not present

## 2024-01-20 DIAGNOSIS — R2681 Unsteadiness on feet: Secondary | ICD-10-CM | POA: Diagnosis not present

## 2024-01-20 DIAGNOSIS — Z741 Need for assistance with personal care: Secondary | ICD-10-CM | POA: Diagnosis not present

## 2024-01-20 DIAGNOSIS — I69919 Unspecified symptoms and signs involving cognitive functions following unspecified cerebrovascular disease: Secondary | ICD-10-CM | POA: Diagnosis not present

## 2024-01-22 ENCOUNTER — Emergency Department (HOSPITAL_COMMUNITY)

## 2024-01-22 ENCOUNTER — Other Ambulatory Visit: Payer: Self-pay

## 2024-01-22 ENCOUNTER — Inpatient Hospital Stay (HOSPITAL_COMMUNITY)
Admission: EM | Admit: 2024-01-22 | Discharge: 2024-01-26 | DRG: 189 | Disposition: A | Discharge status: Skilled Nursing Facility | Source: Skilled Nursing Facility | Attending: Internal Medicine | Admitting: Internal Medicine

## 2024-01-22 DIAGNOSIS — J45909 Unspecified asthma, uncomplicated: Secondary | ICD-10-CM | POA: Diagnosis present

## 2024-01-22 DIAGNOSIS — D638 Anemia in other chronic diseases classified elsewhere: Secondary | ICD-10-CM | POA: Diagnosis present

## 2024-01-22 DIAGNOSIS — Z87891 Personal history of nicotine dependence: Secondary | ICD-10-CM

## 2024-01-22 DIAGNOSIS — I480 Paroxysmal atrial fibrillation: Secondary | ICD-10-CM | POA: Diagnosis not present

## 2024-01-22 DIAGNOSIS — Z7951 Long term (current) use of inhaled steroids: Secondary | ICD-10-CM

## 2024-01-22 DIAGNOSIS — E8729 Other acidosis: Principal | ICD-10-CM | POA: Diagnosis present

## 2024-01-22 DIAGNOSIS — L89312 Pressure ulcer of right buttock, stage 2: Secondary | ICD-10-CM | POA: Diagnosis not present

## 2024-01-22 DIAGNOSIS — E7220 Disorder of urea cycle metabolism, unspecified: Secondary | ICD-10-CM | POA: Diagnosis not present

## 2024-01-22 DIAGNOSIS — J9622 Acute and chronic respiratory failure with hypercapnia: Secondary | ICD-10-CM | POA: Diagnosis not present

## 2024-01-22 DIAGNOSIS — E785 Hyperlipidemia, unspecified: Secondary | ICD-10-CM | POA: Diagnosis not present

## 2024-01-22 DIAGNOSIS — J9601 Acute respiratory failure with hypoxia: Secondary | ICD-10-CM | POA: Diagnosis not present

## 2024-01-22 DIAGNOSIS — E875 Hyperkalemia: Secondary | ICD-10-CM | POA: Diagnosis not present

## 2024-01-22 DIAGNOSIS — R5383 Other fatigue: Secondary | ICD-10-CM | POA: Diagnosis not present

## 2024-01-22 DIAGNOSIS — R609 Edema, unspecified: Secondary | ICD-10-CM | POA: Diagnosis not present

## 2024-01-22 DIAGNOSIS — Z7901 Long term (current) use of anticoagulants: Secondary | ICD-10-CM

## 2024-01-22 DIAGNOSIS — E66813 Obesity, class 3: Secondary | ICD-10-CM | POA: Diagnosis present

## 2024-01-22 DIAGNOSIS — E1159 Type 2 diabetes mellitus with other circulatory complications: Secondary | ICD-10-CM | POA: Diagnosis not present

## 2024-01-22 DIAGNOSIS — J9621 Acute and chronic respiratory failure with hypoxia: Secondary | ICD-10-CM | POA: Diagnosis not present

## 2024-01-22 DIAGNOSIS — G9341 Metabolic encephalopathy: Secondary | ICD-10-CM | POA: Diagnosis not present

## 2024-01-22 DIAGNOSIS — Z8249 Family history of ischemic heart disease and other diseases of the circulatory system: Secondary | ICD-10-CM

## 2024-01-22 DIAGNOSIS — I5030 Unspecified diastolic (congestive) heart failure: Secondary | ICD-10-CM | POA: Diagnosis not present

## 2024-01-22 DIAGNOSIS — J441 Chronic obstructive pulmonary disease with (acute) exacerbation: Secondary | ICD-10-CM | POA: Diagnosis not present

## 2024-01-22 DIAGNOSIS — L89322 Pressure ulcer of left buttock, stage 2: Secondary | ICD-10-CM | POA: Diagnosis not present

## 2024-01-22 DIAGNOSIS — J9602 Acute respiratory failure with hypercapnia: Secondary | ICD-10-CM | POA: Diagnosis present

## 2024-01-22 DIAGNOSIS — I5021 Acute systolic (congestive) heart failure: Secondary | ICD-10-CM | POA: Diagnosis not present

## 2024-01-22 DIAGNOSIS — I272 Pulmonary hypertension, unspecified: Secondary | ICD-10-CM | POA: Diagnosis present

## 2024-01-22 DIAGNOSIS — E11649 Type 2 diabetes mellitus with hypoglycemia without coma: Secondary | ICD-10-CM | POA: Diagnosis not present

## 2024-01-22 DIAGNOSIS — E662 Morbid (severe) obesity with alveolar hypoventilation: Secondary | ICD-10-CM | POA: Diagnosis present

## 2024-01-22 DIAGNOSIS — G4733 Obstructive sleep apnea (adult) (pediatric): Secondary | ICD-10-CM | POA: Diagnosis not present

## 2024-01-22 DIAGNOSIS — E119 Type 2 diabetes mellitus without complications: Secondary | ICD-10-CM | POA: Diagnosis not present

## 2024-01-22 DIAGNOSIS — I10 Essential (primary) hypertension: Secondary | ICD-10-CM | POA: Diagnosis not present

## 2024-01-22 DIAGNOSIS — F0390 Unspecified dementia without behavioral disturbance: Secondary | ICD-10-CM | POA: Diagnosis present

## 2024-01-22 DIAGNOSIS — R930 Abnormal findings on diagnostic imaging of skull and head, not elsewhere classified: Secondary | ICD-10-CM | POA: Diagnosis not present

## 2024-01-22 DIAGNOSIS — Z794 Long term (current) use of insulin: Secondary | ICD-10-CM | POA: Diagnosis not present

## 2024-01-22 DIAGNOSIS — Z833 Family history of diabetes mellitus: Secondary | ICD-10-CM

## 2024-01-22 DIAGNOSIS — E1110 Type 2 diabetes mellitus with ketoacidosis without coma: Secondary | ICD-10-CM

## 2024-01-22 DIAGNOSIS — Z66 Do not resuscitate: Secondary | ICD-10-CM | POA: Diagnosis not present

## 2024-01-22 DIAGNOSIS — Z79899 Other long term (current) drug therapy: Secondary | ICD-10-CM

## 2024-01-22 DIAGNOSIS — Z6841 Body Mass Index (BMI) 40.0 and over, adult: Secondary | ICD-10-CM

## 2024-01-22 DIAGNOSIS — K219 Gastro-esophageal reflux disease without esophagitis: Secondary | ICD-10-CM | POA: Diagnosis present

## 2024-01-22 DIAGNOSIS — I5043 Acute on chronic combined systolic (congestive) and diastolic (congestive) heart failure: Secondary | ICD-10-CM | POA: Diagnosis not present

## 2024-01-22 DIAGNOSIS — R918 Other nonspecific abnormal finding of lung field: Secondary | ICD-10-CM | POA: Diagnosis not present

## 2024-01-22 DIAGNOSIS — E872 Acidosis, unspecified: Secondary | ICD-10-CM | POA: Diagnosis not present

## 2024-01-22 DIAGNOSIS — R5381 Other malaise: Secondary | ICD-10-CM | POA: Diagnosis present

## 2024-01-22 DIAGNOSIS — J811 Chronic pulmonary edema: Secondary | ICD-10-CM | POA: Diagnosis not present

## 2024-01-22 DIAGNOSIS — J90 Pleural effusion, not elsewhere classified: Secondary | ICD-10-CM | POA: Diagnosis not present

## 2024-01-22 DIAGNOSIS — Z91199 Patient's noncompliance with other medical treatment and regimen due to unspecified reason: Secondary | ICD-10-CM

## 2024-01-22 DIAGNOSIS — Z9981 Dependence on supplemental oxygen: Secondary | ICD-10-CM | POA: Diagnosis not present

## 2024-01-22 DIAGNOSIS — I152 Hypertension secondary to endocrine disorders: Secondary | ICD-10-CM | POA: Diagnosis present

## 2024-01-22 DIAGNOSIS — I6782 Cerebral ischemia: Secondary | ICD-10-CM | POA: Diagnosis not present

## 2024-01-22 LAB — URINALYSIS, ROUTINE W REFLEX MICROSCOPIC
Bacteria, UA: NONE SEEN
Bilirubin Urine: NEGATIVE
Glucose, UA: NEGATIVE mg/dL
Hgb urine dipstick: NEGATIVE
Ketones, ur: NEGATIVE mg/dL
Nitrite: NEGATIVE
Protein, ur: 100 mg/dL — AB
Specific Gravity, Urine: 1.02 (ref 1.005–1.030)
pH: 5 (ref 5.0–8.0)

## 2024-01-22 LAB — COMPREHENSIVE METABOLIC PANEL WITH GFR
ALT: 7 U/L (ref 0–44)
AST: 16 U/L (ref 15–41)
Albumin: 3.8 g/dL (ref 3.5–5.0)
Alkaline Phosphatase: 59 U/L (ref 38–126)
Anion gap: 8 (ref 5–15)
BUN: 26 mg/dL — ABNORMAL HIGH (ref 8–23)
CO2: 30 mmol/L (ref 22–32)
Calcium: 10 mg/dL (ref 8.9–10.3)
Chloride: 96 mmol/L — ABNORMAL LOW (ref 98–111)
Creatinine, Ser: 1.26 mg/dL — ABNORMAL HIGH (ref 0.44–1.00)
GFR, Estimated: 42 mL/min — ABNORMAL LOW (ref >60)
Glucose, Bld: 119 mg/dL — ABNORMAL HIGH (ref 70–99)
Potassium: 6.7 mmol/L (ref 3.5–5.1)
Sodium: 135 mmol/L (ref 135–145)
Total Bilirubin: 0.3 mg/dL (ref 0.0–1.2)
Total Protein: 7 g/dL (ref 6.5–8.1)

## 2024-01-22 LAB — CBC WITH DIFFERENTIAL/PLATELET
Abs Immature Granulocytes: 0.13 K/uL — ABNORMAL HIGH (ref 0.00–0.07)
Basophils Absolute: 0 K/uL (ref 0.0–0.1)
Basophils Relative: 0 %
Eosinophils Absolute: 0 K/uL (ref 0.0–0.5)
Eosinophils Relative: 1 %
HCT: 34.5 % — ABNORMAL LOW (ref 36.0–46.0)
Hemoglobin: 9.2 g/dL — ABNORMAL LOW (ref 12.0–15.0)
Immature Granulocytes: 3 %
Lymphocytes Relative: 12 %
Lymphs Abs: 0.6 K/uL — ABNORMAL LOW (ref 0.7–4.0)
MCH: 29.3 pg (ref 26.0–34.0)
MCHC: 26.7 g/dL — ABNORMAL LOW (ref 30.0–36.0)
MCV: 109.9 fL — ABNORMAL HIGH (ref 80.0–100.0)
Monocytes Absolute: 0.4 K/uL (ref 0.1–1.0)
Monocytes Relative: 9 %
Neutro Abs: 3.6 K/uL (ref 1.7–7.7)
Neutrophils Relative %: 75 %
Platelets: 186 K/uL (ref 150–400)
RBC: 3.14 MIL/uL — ABNORMAL LOW (ref 3.87–5.11)
RDW: 13.8 % (ref 11.5–15.5)
WBC: 4.7 K/uL (ref 4.0–10.5)
nRBC: 0 % (ref 0.0–0.2)

## 2024-01-22 LAB — BLOOD GAS, VENOUS
Acid-Base Excess: 5.6 mmol/L — ABNORMAL HIGH (ref 0.0–2.0)
Bicarbonate: 39.3 mmol/L — ABNORMAL HIGH (ref 20.0–28.0)
O2 Saturation: 78.1 %
Patient temperature: 37
pCO2, Ven: 121 mmHg (ref 44–60)
pH, Ven: 7.12 — CL (ref 7.25–7.43)
pO2, Ven: 41 mmHg (ref 32–45)

## 2024-01-22 LAB — PRO BRAIN NATRIURETIC PEPTIDE: Pro Brain Natriuretic Peptide: 3135 pg/mL — ABNORMAL HIGH (ref <300.0)

## 2024-01-22 LAB — CBG MONITORING, ED
Glucose-Capillary: 122 mg/dL — ABNORMAL HIGH (ref 70–99)
Glucose-Capillary: 125 mg/dL — ABNORMAL HIGH (ref 70–99)

## 2024-01-22 LAB — TROPONIN T, HIGH SENSITIVITY: Troponin T High Sensitivity: 22 ng/L — ABNORMAL HIGH (ref 0–19)

## 2024-01-22 LAB — MAGNESIUM: Magnesium: 2.3 mg/dL (ref 1.7–2.4)

## 2024-01-22 LAB — I-STAT CG4 LACTIC ACID, ED: Lactic Acid, Venous: 1.4 mmol/L (ref 0.5–1.9)

## 2024-01-22 LAB — AMMONIA: Ammonia: 85 umol/L — ABNORMAL HIGH (ref 9–35)

## 2024-01-22 MED ORDER — CALCIUM GLUCONATE-NACL 1-0.675 GM/50ML-% IV SOLN
1.0000 g | Freq: Once | INTRAVENOUS | Status: AC
  Administered 2024-01-22: 1000 mg via INTRAVENOUS
  Filled 2024-01-22: qty 50

## 2024-01-22 MED ORDER — SODIUM BICARBONATE 8.4 % IV SOLN
50.0000 meq | Freq: Once | INTRAVENOUS | Status: AC
  Administered 2024-01-22: 50 meq via INTRAVENOUS
  Filled 2024-01-22: qty 50

## 2024-01-22 MED ORDER — SODIUM CHLORIDE 0.9 % IV SOLN
500.0000 mg | Freq: Once | INTRAVENOUS | Status: AC
  Administered 2024-01-23: 500 mg via INTRAVENOUS
  Filled 2024-01-22: qty 5

## 2024-01-22 MED ORDER — FUROSEMIDE 10 MG/ML IJ SOLN
20.0000 mg | Freq: Once | INTRAMUSCULAR | Status: AC
  Administered 2024-01-23: 20 mg via INTRAVENOUS
  Filled 2024-01-22: qty 4

## 2024-01-22 MED ORDER — DEXTROSE 50 % IV SOLN
1.0000 | Freq: Once | INTRAVENOUS | Status: AC
  Administered 2024-01-22: 50 mL via INTRAVENOUS
  Filled 2024-01-22: qty 50

## 2024-01-22 MED ORDER — SODIUM CHLORIDE 0.9 % IV SOLN
1.0000 g | Freq: Once | INTRAVENOUS | Status: AC
  Administered 2024-01-22: 1 g via INTRAVENOUS
  Filled 2024-01-22: qty 10

## 2024-01-22 MED ORDER — INSULIN ASPART 100 UNIT/ML IV SOLN
10.0000 [IU] | Freq: Once | INTRAVENOUS | Status: AC
  Administered 2024-01-22: 10 [IU] via INTRAVENOUS
  Filled 2024-01-22: qty 0.1

## 2024-01-22 NOTE — ED Notes (Signed)
 IV team at bedside

## 2024-01-22 NOTE — ED Provider Notes (Signed)
 [place] EMERGENCY DEPARTMENT AT [facility] Provider Note   CSN: [id number] Arrival date & time: 01/22/24  2010     Patient presents with: Fatigue   Elizabeth [name] is a 83 y.o. female.   HPI   Patient has a history of bronchitis arthritis asthma acid reflux diabetes hypertension, heart failure.  Patient resides at a nursing facility.  She states at baseline she is nonambulatory.  She uses a wheelchair.  Nursing staff sent her to the ED for evaluation of not acting like herself for the last couple days.  She has been more sleepy and fatigued.  Patient herself states she has been feeling very tired the last few days.  She is not having any trouble with headaches.  She is not having chest pain.  She denies any shortness of breath.  She denies any abdominal pain.  No fevers or chills.  Prior to Admission medications   Medication Sig Start Date End Date Taking? Authorizing Provider  amLODipine  (NORVASC ) 10 MG tablet Take 1 tablet (10 mg total) by mouth daily. 12/08/23  Yes Singh, Prashant K, MD  celecoxib (CELEBREX) 200 MG capsule Take 200 mg by mouth daily. 01/11/24  Yes [provider]  Cetirizine  HCl 10 MG CAPS Take 10 mg by mouth daily.   Yes [provider]  cyanocobalamin  (VITAMIN B12) 1000 MCG tablet Take 1,000 mcg by mouth daily.   Yes [provider]  donepezil  (ARICEPT ) 5 MG tablet Take 1 tablet (5 mg total) by mouth daily. Patient taking differently: Take 5 mg by mouth at bedtime. 07/30/23  Yes Wertman, Sara E, PA-C  Multiple Vitamins-Minerals (CENTRUM SILVER PO) Take 1 tablet by mouth daily.   Yes [provider]  potassium chloride  (KLOR-CON ) 10 MEQ tablet Take 10 mEq by mouth daily.   Yes [provider]  acetaminophen  (TYLENOL ) 500 MG tablet Take 500 mg by mouth 3 (three) times daily.    [provider]  albuterol  (PROVENTIL  HFA;VENTOLIN  HFA) 108 (90 BASE) MCG/ACT inhaler Inhale 2 puffs into the lungs every 6  (six) hours as needed for shortness of breath.    [provider]  albuterol  (PROVENTIL ) (2.5 MG/3ML) 0.083% nebulizer solution Take 2.5 mg by nebulization every 6 (six) hours.    [provider]  apixaban  (ELIQUIS ) 5 MG TABS tablet Take 1 tablet (5 mg total) by mouth 2 (two) times daily. 04/28/23   Amin, Ankit C, MD  bisacodyl  (DULCOLAX) 5 MG EC tablet Take 2 tablets (10 mg total) by mouth daily as needed for moderate constipation or severe constipation. 04/28/23   Amin, Ankit C, MD  diclofenac  Sodium (VOLTAREN ) 1 % GEL Apply 4 g topically 4 (four) times daily. Apply to bilateral knees 04/24/21   [provider]  fluticasone  (FLONASE ) 50 MCG/ACT nasal spray PLACE 1 SPRAY INTO BOTH NOSTRILS 2 (TWO) TIMES DAILY 09/13/22   Shellia Oh, MD  fluticasone -salmeterol (ADVAIR) 250-50 MCG/ACT AEPB Inhale 1 puff into the lungs in the morning and at bedtime.    [provider]  furosemide  (LASIX ) 40 MG tablet Take 1 tablet (40 mg total) by mouth daily as needed. Weigh yourself daily.  Take one dose by mouth if your weight increases by more than 2 pounds in one day or 5 pounds in one week. 09/18/23 09/17/24  Shalhoub, Zachary PARAS, MD  hydrALAZINE  (APRESOLINE ) 50 MG tablet Take 50 mg by mouth every 6 (six) hours. 04/10/23   [provider]  insulin  aspart (NOVOLOG ) 100 UNIT/ML  injection Before each meal 3 times a day, 140-199 - 2 units, 200-250 - 4 units, 251-299 - 6 units,  300-349 - 8 units,  350 or above call your MD. 12/08/23   Dennise Lavada POUR, MD  losartan  (COZAAR ) 50 MG tablet Take 1 tablet (50 mg total) by mouth daily. 12/08/23   Singh, Prashant K, MD  melatonin 3 MG TABS tablet Take 6 mg by mouth at bedtime.    [provider]  Menthol, Topical Analgesic, (BIOFREEZE COOL THE PAIN EX) Apply 1 application  topically in the morning and at bedtime. Apply to both hands, ankles    [provider]  oxybutynin  (DITROPAN -XL) 10 MG 24 hr tablet Take 10 mg by mouth  daily. 12/09/22   [provider]  OXYGEN  Inhale 2-3 L/min into the lungs See admin instructions. When using CPAP and night, bleeding O2 3L. Oxygen  via nasal cannula at 2 L/min as needed for shortness of breath or sats < 90%.    [provider]  pantoprazole  (PROTONIX ) 40 MG tablet Take 40 mg by mouth daily.    [provider]  Propylene Glycol (SYSTANE COMPLETE) 0.6 % SOLN Place 1 drop into both eyes every 6 (six) hours as needed (dry eyes).    [provider]  rosuvastatin  (CRESTOR ) 5 MG tablet Take 5 mg by mouth at bedtime. 12/09/22   [provider]  senna (SENOKOT) 8.6 MG tablet Take 17.2 mg by mouth daily.    [provider]  sertraline  (ZOLOFT ) 25 MG tablet Take 25 mg by mouth daily.    [provider]  sodium phosphate (FLEET) ENEM Place 1 enema rectally once as needed (constipation).    [provider]  UNABLE TO FIND Inhale 1 Device into the lungs See admin instructions. Apply CPAP settings 10/5 with bleeding O2 3L into CPAP at bedtime and as needed at bedtime for shortness of breath, wheezing. Must document placement in progress note    [provider]    Allergies: Patient has no known allergies.    Review of Systems  Updated Vital Signs BP (!) 114/47   Pulse (!) 54   Temp (!) 97.5 F (36.4 C) (Oral)   Resp 16   SpO2 100%   Physical Exam Vitals and nursing note reviewed.  Constitutional:      Appearance: She is well-developed.     Comments: Does appear fatigued but is awake and answering questions appropriately, does fall asleep at the bedside, increased BMI  HENT:     Head: Normocephalic and atraumatic.     Right Ear: External ear normal.     Left Ear: External ear normal.  Eyes:     General: No scleral icterus.       Right eye: No discharge.        Left eye: No discharge.     Conjunctiva/sclera: Conjunctivae normal.  Neck:     Trachea: No tracheal deviation.  Cardiovascular:     Rate  and Rhythm: Normal rate and regular rhythm.  Pulmonary:     Effort: Pulmonary effort is normal. No respiratory distress.     Breath sounds: Normal breath sounds. No stridor. No wheezing or rales.  Abdominal:     General: Bowel sounds are normal. There is no distension.     Palpations: Abdomen is soft.     Tenderness: There is no abdominal tenderness. There is no guarding or rebound.  Musculoskeletal:        General: No tenderness or  deformity.     Cervical back: Neck supple.     Right lower leg: No edema.     Left lower leg: No edema.  Skin:    General: Skin is warm and dry.     Coloration: Skin is not jaundiced.     Findings: No rash.  Neurological:     General: No focal deficit present.     Mental Status: She is alert.     Cranial Nerves: No cranial nerve deficit, dysarthria or facial asymmetry.     Sensory: No sensory deficit.     Motor: Weakness present. No abnormal muscle tone or seizure activity.     Coordination: Coordination normal.     Comments: Generalized weakness although is able to grip with both of her hands equally, is able to wiggle her toes overall have difficulty lifting her legs off the bed, normal sensation no facial droop  Psychiatric:        Mood and Affect: Mood normal.     (all labs ordered are listed, but only abnormal results are displayed) Labs Reviewed  COMPREHENSIVE METABOLIC PANEL WITH GFR - Abnormal; Notable for the following components:      Result Value   Potassium 6.7 (*)    Chloride 96 (*)    Glucose, Bld 119 (*)    BUN 26 (*)    Creatinine, Ser 1.26 (*)    GFR, Estimated 42 (*)    All other components within normal limits  CBC WITH DIFFERENTIAL/PLATELET - Abnormal; Notable for the following components:   RBC 3.14 (*)    Hemoglobin 9.2 (*)    HCT 34.5 (*)    MCV 109.9 (*)    MCHC 26.7 (*)    Lymphs Abs 0.6 (*)    Abs Immature Granulocytes 0.13 (*)    All other components within normal limits  URINALYSIS, ROUTINE W REFLEX MICROSCOPIC  - Abnormal; Notable for the following components:   APPearance CLOUDY (*)    Protein, ur 100 (*)    Leukocytes,Ua TRACE (*)    Non Squamous Epithelial 0-5 (*)    All other components within normal limits  AMMONIA - Abnormal; Notable for the following components:   Ammonia 85 (*)    All other components within normal limits  BLOOD GAS, VENOUS - Abnormal; Notable for the following components:   pH, Ven 7.12 (*)    pCO2, Ven 121 (*)    Bicarbonate 39.3 (*)    Acid-Base Excess 5.6 (*)    All other components within normal limits  PRO BRAIN NATRIURETIC PEPTIDE - Abnormal; Notable for the following components:   Pro Brain Natriuretic Peptide 3,135.0 (*)    All other components within normal limits  CBG MONITORING, ED - Abnormal; Notable for the following components:   Glucose-Capillary 125 (*)    All other components within normal limits  TROPONIN T, HIGH SENSITIVITY - Abnormal; Notable for the following components:   Troponin T High Sensitivity 22 (*)    All other components within normal limits  MAGNESIUM   BASIC METABOLIC PANEL WITH GFR  BLOOD GAS, VENOUS  I-STAT CG4 LACTIC ACID, ED  TROPONIN T, HIGH SENSITIVITY    EKG: EKG Interpretation Date/Time:  Thursday January 22 2024 20:24:59 EDT Ventricular Rate:  69 PR Interval:  161 QRS Duration:  137 QT Interval:  364 QTC Calculation: 297 R Axis:   -72  Text Interpretation: Sinus rhythm Ventricular bigeminy , new since last tracing 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 RADIATION DOSE REDUCTION: This exam was performed according to the departmental dose-optimization program  which includes automated exposure control, adjustment of the mA and/or kV according to patient size and/or use of iterative reconstruction technique. COMPARISON:  MRI 08/06/2022, CT brain 09/14/2023 FINDINGS: Brain: No acute territorial infarction, hemorrhage or intracranial mass. Mild chronic small vessel ischemic changes of the white matter. Nonenlarged ventricles Vascular: No hyperdense vessels.  Carotid vascular calcification Skull: Normal. Negative for fracture or focal lesion. Sinuses/Orbits: Patchy mucosal thickening right ethmoid sinus. Is incompletely visualized complex mass in the right sinus with calcification and hyperdensity, protruding into the right nasal cavity as before. Chronic right maxillary sinus wall thickening. Other: None IMPRESSION: 1. No CT evidence for acute intracranial abnormality. 2. Mild chronic small vessel ischemic changes of the white matter. 3. Incompletely visualized complex mass in the right sinus with calcification and hyperdensity, protruding into the right nasal cavity as before. Reference facial CT 10/14/2022 Electronically Signed   By: Luke Bun M.D.   On: 01/22/2024 21:57   DG Chest Port 1 View Result Date: 01/22/2024 CLINICAL DATA:  Fatigue, weakness, and lethargy. EXAM: PORTABLE CHEST 1 VIEW COMPARISON:  Portable chest 12/06/2023. FINDINGS: The heart is moderately enlarged. Stable mediastinum with aortic atherosclerosis and tortuosity. There is central vascular engorgement, perihilar ground-glass and septal edema, and moderate pleural effusions. Findings consistent with CHF or fluid overload with similar findings previously. There are patchy opacities in the lower lung fields which could be atelectasis or consolidation. Airspace edema is also possible. The upper lung fields without focal infiltrates. Multiple surgical clips are again noted at the neck base. No new osseous findings.  Advanced thoracic spondylosis. IMPRESSION: 1. Cardiomegaly with central vascular  engorgement, perihilar ground-glass and septal edema, and moderate pleural effusions. Findings consistent with CHF or fluid overload with similar findings previously. 2. Patchy opacities in the lower lung fields which could be atelectasis, consolidation or airspace edema. Electronically Signed   By: Francis Quam M.D.   On: 01/22/2024 21:03     .Critical Care  Performed by: Randol Simmonds, MD Authorized by: Randol Simmonds, MD   Critical care provider statement:    Critical care time (minutes):  30   Critical care was time spent personally by me on the following activities:  Development of treatment plan with patient or surrogate, discussions with consultants, evaluation of patient's response to treatment, examination of patient, ordering and review of laboratory studies, ordering and review of radiographic studies, ordering and performing treatments and interventions, pulse oximetry, re-evaluation of patient's condition and review of old charts    Medications Ordered in the ED  cefTRIAXone  (ROCEPHIN ) 1 g in sodium chloride  0.9 % 100 mL IVPB (has no administration in time range)  azithromycin (ZITHROMAX) 500 mg in sodium chloride  0.9 % 250 mL IVPB (has no administration in time range)  furosemide  (LASIX ) injection 20 mg (has no administration in time range)  insulin  aspart (novoLOG ) injection 10 Units (10 Units Intravenous Given 01/22/24 2233)    And  dextrose  50 % solution 50 mL (50 mLs Intravenous Given 01/22/24 2230)  calcium  gluconate 1 g/ 50 mL sodium chloride  IVPB (0 mg Intravenous Stopped 01/22/24 2303)  sodium bicarbonate injection 50 mEq (50  mEq Intravenous Given 01/22/24 2229)    Clinical Course as of 01/22/24 2344  Thu Jan 22, 2024  2106 CBC WITH DIFFERENTIAL(!) Hemoglobin stable [JK]  2117 Venous blood gas reported as a pH 7.1 with pCO2 greater than 100 [JK]  2118 DNR paperwork reviewed.  Patient has DNR/DNI but will allow BiPAP [JK]  2148 Patient is currently on BiPAP.  Mental status  on change.  Patient still somnolent but does respond to questions [JK]  2159 Discussed with Dr Layman, PCCM will see pt [JK]  2220 Pro Brain natriuretic peptide(!) BNP elevated [JK]  2221 Comprehensive metabolic panel(!!) Potassium elevated 6.7.  Creatinine increased to 1.26.  Will treat for hyperkalemia but will repeat potassium level [JK]  2322 WATCHER DNR DNI  On BIPAP for ARF.  [CC]    Clinical Course User Index [CC] Jerral Meth, MD [JK] Randol Simmonds, MD                                 Medical Decision Making Problems Addressed: Hyperkalemia: acute illness or injury that poses a threat to life or bodily functions Respiratory acidosis: acute illness or injury that poses a threat to life or bodily functions  Amount and/or Complexity of Data Reviewed Labs: ordered. Decision-making details documented in ED Course. Radiology: ordered and independent interpretation performed.  Risk OTC drugs. Prescription drug management. Decision regarding hospitalization.   Patient presented to the ED for evaluation of increasing lethargy.  Patient answering questions appropriately on arrival.  Somnolent but did arouse to voice.  ED workup notable for significant respiratory acidosis.  Patient was started on BiPAP.  She does have a MOST form and DNR form with her.  Patient would not want intubation or CPR.  Labs also notable for hyperkalemia.  Will repeat the sample but patient has been treated empirically with insulin  dextrose  calcium  sodium bicarbonate.  Ammonia also noted to be elevated.  However patient does not have any evidence of hepatic dysfunction.  Chest x-ray shows possibility of pulmonary edema or central trait.  Will start empirically on antibiotics and give a dose of Lasix .  I have consulted with pulmonary critical care regarding admission     Final diagnoses:  Respiratory acidosis  Hyperkalemia    ED Discharge Orders     None          Randol Simmonds,  MD 01/22/24 2346

## 2024-01-22 NOTE — ED Notes (Signed)
 XR at bedside

## 2024-01-22 NOTE — ED Notes (Signed)
 CRITICAL VALUE STICKER  CRITICAL VALUE: K 6.7   RECEIVER (on-site recipient of call): Hong Timm RN   DATE & TIME NOTIFIED: 10/2 @ 2158   MESSENGER (representative from lab): NA  MD NOTIFIED: Randol   TIME OF NOTIFICATION: 10/2 @ 2159  RESPONSE: Pending new orders

## 2024-01-22 NOTE — ED Notes (Signed)
 CRITICAL VALUE STICKER  CRITICAL VALUE: Venous Blood Gas pH 7.12 with CO2 121  RECEIVER (on-site recipient of call): [facility] RN  DATE & TIME NOTIFIED: 10/2 @ 2114  MD NOTIFIED: EDP Knapp   TIME OF NOTIFICATION: 2117  RESPONSE: Bipap order. RT called. Additional ordered IV team consult for additional IV access

## 2024-01-22 NOTE — ED Notes (Signed)
 EDP Knapp at bedside informed pt has been less interactive with RN through her q30 neuro assessments. When pt initial came in for triage pt was able to state here name, where she was at, and why she was here. Pt now is only stating her name and falling back to sleep. Is arousalable to stimuli vs initially to voice. Pt continue to await CC. No change in POC.

## 2024-01-22 NOTE — ED Notes (Signed)
RT called regarding BiPap order.

## 2024-01-22 NOTE — Progress Notes (Signed)
   01/22/24 2131  BiPAP/CPAP/SIPAP  $ Face Mask Medium Yes  BiPAP/CPAP/SIPAP Pt Type Adult  BiPAP/CPAP/SIPAP V60  Mask Type Full face mask  Dentures removed? Not applicable  Mask Size Medium  Set Rate 15 breaths/min  Respiratory Rate 16 breaths/min  IPAP 18 cmH20  EPAP 6 cmH2O  FiO2 (%) 40 %  Minute Ventilation 7.5  Leak 3  Peak Inspiratory Pressure (PIP) 18  Tidal Volume (Vt) 471  Patient Home Machine No  Patient Home Mask No  Patient Home Tubing No  Auto Titrate No  Press High Alarm 30 cmH2O  Press Low Alarm 5 cmH2O  Device Plugged into RED Power Outlet Yes  BiPAP/CPAP /SiPAP Vitals  Resp 16  Bilateral Breath Sounds Diminished  MEWS Score/Color  MEWS Score 1  MEWS Score Color Green

## 2024-01-22 NOTE — ED Triage Notes (Signed)
 Pt BIB [facility] from [facility] d/t concerns reports by SNF Staff. They report pt has not been herself for the past couple of days. Sts she is more slow to respond. Pts sts she feels more sleepy than normal. Has had hx of recurrent UTIs. Disoriented to time. Baseline 3 L [place]   EMS reports CO2 80-90s throughout transport, CBG 127 HR 60s, SBP 160s

## 2024-01-22 NOTE — Consult Note (Incomplete)
 NAME:  Elizabeth [name], MRN:  [id number], DOB:  [birth year], LOS: 0 ADMISSION DATE:  01/22/2024, CONSULTATION DATE:  01/22/24 REFERRING MD:  EDP, CHIEF COMPLAINT:  acute metabolic encephalopathy   History of Present Illness:  83 yo female presented with confusion  from SNF that was relatively progressive in onset, starting over past few days. Found to have CO of >100 started on NIV. When I assessed pt however, NIV mask was partially in place and continued to require readjustment during my eval. Larger mask requested to RN and EDP to ensure fit. Pt does arouse but drifts back to sleep quickly, is maintaining airway and follows commands when awake.   Pt does endorse she has felt more sleepy and not herself the past few days, denies any other associated symptoms. 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 rate 14, SpO2 100%.    FiO2 (%):  [40 %] 40 %   Intake/Output Summary (Last 24 hours) at 01/22/2024 2220 Last data filed at 01/22/2024 2106 Gross per 24 hour  Intake --  Output 35 ml  Net -35 ml   There were no vitals filed for this visit.  Examination: General: somnolent but arousable, follows commands then drifts back to sleep NIV in place HENT: ncat eomi, perrla mmdry but pink Lungs: diminished bilaterally Cardiovascular: rrr Abdomen: soft nt, nd bs+ Extremities: no c/c/e Neuro: minimal movement in b/l LE but equal b/l upper movement  and 4/5 strength in upper GU: deferred  Resolved problem list   Assessment and Plan  Acute hypercapneic resp failure Chronic hypoxic resp failure (on 3L baseline) Acute exacerbation HFrEF Hyperkalemia T2dm well controlled AOCD Metabolic encephalopathy Hyperammonemia hypercalcemia -pt needs more appropriatly fitted NIV mask to ensure ventilation otherwise high risk intubation.  -lactulose when able to take PT safely -trend K, repeat pending -monitor h&h -transfuse <7 -suspect encephalopathy will improve once metabolic derangements improve  -ssi -duonebs, pulm hygiene -ua negative for acute infection -BNP 3135, diurese as able with renal function, given 20mg  lasix , f/u I/O -echo pending -send coags and lft at this time with elevated ammonia -with snf residence if sepsis enters differential enters (low wbc, no fever, no lactate) would broaden from ctx to vanc/cefepime   -monitor calcium  in light of renal function and needs diuresiss  Labs   CBC: Recent Labs  Lab 01/22/24 2040  WBC 4.7  NEUTROABS 3.6  HGB 9.2*  HCT 34.5*  MCV 109.9*  PLT 186    Basic Metabolic Panel: Recent Labs  Lab 01/22/24 2040  NA 135  K 6.7*  CL 96*  CO2 30  GLUCOSE 119*  BUN 26*  CREATININE 1.26*  CALCIUM  10.0  MG 2.3   GFR: CrCl cannot be calculated (Unknown ideal weight.). Recent Labs  Lab 01/22/24 2040  WBC 4.7    Liver Function Tests: Recent Labs  Lab 01/22/24 2040  AST 16  ALT 7  ALKPHOS 59  BILITOT 0.3  PROT 7.0  ALBUMIN 3.8   No results for  input(s): LIPASE, AMYLASE in the last 168 hours. Recent Labs  Lab 01/22/24 2107  AMMONIA 85*    ABG    Component Value Date/Time   PHART 7.41 09/14/2023 1749   PCO2ART 81 (HH) 09/14/2023 1749   PO2ART 67 (L) 09/14/2023 1749   HCO3 39.3 (H) 01/22/2024 2055   TCO2 40 (H) 12/05/2023 0425   O2SAT 78.1 01/22/2024 2055     Coagulation Profile: No results for input(s): INR, PROTIME in the last 168  hours.  Cardiac Enzymes: No results for input(s): CKTOTAL, CKMB, CKMBINDEX, TROPONINI in the last 168 hours.  HbA1C: Hgb A1c MFr Bld  Date/Time Value Ref Range Status  09/15/2023 03:31 AM 4.5 (L) 4.8 - 5.6 % Final    Comment:    (NOTE) Pre diabetes:          5.7%-6.4%  Diabetes:              >6.4%  Glycemic control for   <7.0% adults with diabetes   08/15/2009 11:44 AM (H) <5.7 % Final   7.4 (NOTE)                                                                       According to the ADA Clinical Practice Recommendations for 2011, when HbA1c is used as a screening test:   >=6.5%   Diagnostic of Diabetes Mellitus           (if abnormal result  is confirmed)  5.7-6.4%   Increased risk of developing Diabetes Mellitus  References:Diagnosis and Classification of Diabetes Mellitus,Diabetes Care,2011,34(Suppl 1):S62-S69 and Standards of Medical Care in         Diabetes - 2011,Diabetes Care,2011,34  (Suppl 1):S11-S61.    CBG: Recent Labs  Lab 01/22/24 2045  GLUCAP 125*    Review of Systems:   As per HPI  Past Medical History:  She,  has a past medical history of (HFpEF) heart failure with preserved ejection fraction (HCC) (12/24/2022), Arthritis, Asthma, Breast mass in female, Bronchitis, Bronchitis, Constipation, Cough, GERD (gastroesophageal reflux disease), Hypertension associated with diabetes (HCC), Type 2 diabetes mellitus (HCC), and Wheezing.   Surgical History:   Past Surgical History:  Procedure Laterality Date   ABDOMINAL HYSTERECTOMY  1996   CYSTECTOMY  1980's   back of neck   THYROID  SURGERY  2001     Social History:   reports that she quit smoking about 60 years ago. Her smoking use included cigarettes. She has never used smokeless tobacco. She reports that she does not drink alcohol  and does not use drugs.   Family History:  Her family history includes Cancer in her maternal uncle; Diabetes in her mother; Heart disease in her mother.   Allergies No  Known Allergies   Home Medications  Prior to Admission medications   Medication Sig Start Date End Date Taking? Authorizing Provider  acetaminophen  (TYLENOL ) 500 MG tablet Take 500 mg by mouth 3 (three) times daily.    [provider]  albuterol  (PROVENTIL  HFA;VENTOLIN  HFA) 108 (90 BASE) MCG/ACT inhaler Inhale 2 puffs into the lungs every 6 (six) hours as needed for shortness of breath.    [provider]  albuterol  (PROVENTIL ) (2.5 MG/3ML) 0.083% nebulizer solution Take 2.5 mg by nebulization every  6 (six) hours.    [provider]  amLODipine  (NORVASC ) 10 MG tablet Take 1 tablet (10 mg total) by mouth daily. 12/08/23   Dennise Lavada POUR, MD  apixaban  (ELIQUIS ) 5 MG TABS tablet Take 1 tablet (5 mg total) by mouth 2 (two) times daily. 04/28/23   Amin, Ankit C, MD  bisacodyl  (DULCOLAX) 5 MG EC tablet Take 2 tablets (10 mg total) by mouth daily as needed for moderate constipation or severe constipation. 04/28/23   Amin, Ankit C, MD  Cetirizine  HCl 10 MG CAPS Take 10 mg by mouth daily.    [provider]  cyanocobalamin  (VITAMIN B12) 1000 MCG tablet Take 1,000 mcg by mouth daily.    [provider]  diclofenac  Sodium (VOLTAREN ) 1 % GEL Apply 4 g topically 4 (four) times daily. Apply to bilateral knees 04/24/21   [provider]  donepezil  (ARICEPT ) 5 MG tablet Take 1 tablet (5 mg total) by mouth daily. Patient taking differently: Take 5 mg by mouth at bedtime. 07/30/23   Wertman, Sara E, PA-C  fluticasone  (FLONASE ) 50 MCG/ACT nasal spray PLACE 1 SPRAY INTO BOTH NOSTRILS 2 (TWO) TIMES DAILY 09/13/22   Sood, Vineet, MD  fluticasone -salmeterol (ADVAIR) 250-50 MCG/ACT AEPB Inhale 1 puff into the lungs in the morning and at bedtime.    [provider]  furosemide  (LASIX ) 40 MG tablet Take 1 tablet (40 mg total) by mouth daily as needed. Weigh yourself daily.  Take one dose by mouth if your weight increases by more than 2 pounds in one day or 5 pounds in  one week. 09/18/23 09/17/24  Shalhoub, Zachary PARAS, MD  hydrALAZINE  (APRESOLINE ) 50 MG tablet Take 50 mg by mouth every 6 (six) hours. 04/10/23   [provider]  insulin  aspart (NOVOLOG ) 100 UNIT/ML injection Before each meal 3 times a day, 140-199 - 2 units, 200-250 - 4 units, 251-299 - 6 units,  300-349 - 8 units,  350 or above call your MD. 12/08/23   Dennise Lavada POUR, MD  losartan  (COZAAR ) 50 MG tablet Take 1 tablet (50 mg total) by mouth daily. 12/08/23   Singh, Prashant K, MD  melatonin 3 MG TABS tablet Take 6 mg by mouth at bedtime.    [provider]  Menthol, Topical Analgesic, (BIOFREEZE COOL THE PAIN EX) Apply 1 application  topically in the morning and at bedtime. Apply to both hands, ankles    [provider]  Multiple Vitamins-Minerals (CENTRUM SILVER PO) Take 1 tablet by mouth daily.    [provider]  oxybutynin  (DITROPAN -XL) 10 MG 24 hr tablet Take 10 mg by mouth daily. 12/09/22   [provider]  OXYGEN  Inhale 2-3 L/min into the lungs See admin instructions. When using CPAP and night, bleeding O2 3L. Oxygen  via nasal cannula at 2 L/min as needed for shortness of breath or sats < 90%.    [provider]  pantoprazole  (PROTONIX ) 40 MG tablet Take 40 mg by mouth daily.    [provider]  potassium chloride  (KLOR-CON ) 10 MEQ tablet Take 10 mEq by mouth daily.    [provider]  Propylene Glycol (SYSTANE COMPLETE) 0.6 % SOLN Place 1 drop into both eyes every 6 (six) hours as needed (dry eyes).    [provider]  rosuvastatin  (CRESTOR ) 5 MG tablet Take 5 mg by mouth at bedtime. 12/09/22   [provider]  senna (SENOKOT) 8.6 MG tablet Take 17.2 mg by mouth daily.    [provider]  sertraline  (ZOLOFT ) 25 MG tablet Take 25 mg by mouth daily.    [provider]  sodium phosphate (FLEET) ENEM Place 1 enema rectally once as needed (constipation).    [provider]  UNABLE TO  FIND Inhale 1 Device into the lungs See admin instructions. Apply CPAP settings 10/5 with bleeding O2 3L into CPAP at bedtime and as needed at bedtime for shortness of breath, wheezing. Must document placement in progress note    [provider]     Critical care time: 

## 2024-01-22 NOTE — ED Notes (Signed)
 Pt transported to and from CT on VS monitor with RT. Pt remains on BiPap

## 2024-01-22 NOTE — Progress Notes (Signed)
 Transported on BiPAP to CT without event.

## 2024-01-23 ENCOUNTER — Inpatient Hospital Stay (HOSPITAL_COMMUNITY)

## 2024-01-23 DIAGNOSIS — E875 Hyperkalemia: Secondary | ICD-10-CM | POA: Diagnosis not present

## 2024-01-23 DIAGNOSIS — E119 Type 2 diabetes mellitus without complications: Secondary | ICD-10-CM

## 2024-01-23 DIAGNOSIS — I480 Paroxysmal atrial fibrillation: Secondary | ICD-10-CM | POA: Diagnosis present

## 2024-01-23 DIAGNOSIS — Z6841 Body Mass Index (BMI) 40.0 and over, adult: Secondary | ICD-10-CM | POA: Diagnosis not present

## 2024-01-23 DIAGNOSIS — I5033 Acute on chronic diastolic (congestive) heart failure: Secondary | ICD-10-CM

## 2024-01-23 DIAGNOSIS — J45909 Unspecified asthma, uncomplicated: Secondary | ICD-10-CM | POA: Diagnosis present

## 2024-01-23 DIAGNOSIS — G4733 Obstructive sleep apnea (adult) (pediatric): Secondary | ICD-10-CM

## 2024-01-23 DIAGNOSIS — R6889 Other general symptoms and signs: Secondary | ICD-10-CM | POA: Diagnosis not present

## 2024-01-23 DIAGNOSIS — D638 Anemia in other chronic diseases classified elsewhere: Secondary | ICD-10-CM | POA: Diagnosis present

## 2024-01-23 DIAGNOSIS — I272 Pulmonary hypertension, unspecified: Secondary | ICD-10-CM | POA: Diagnosis present

## 2024-01-23 DIAGNOSIS — J9621 Acute and chronic respiratory failure with hypoxia: Secondary | ICD-10-CM | POA: Diagnosis present

## 2024-01-23 DIAGNOSIS — I5043 Acute on chronic combined systolic (congestive) and diastolic (congestive) heart failure: Secondary | ICD-10-CM | POA: Diagnosis present

## 2024-01-23 DIAGNOSIS — J9611 Chronic respiratory failure with hypoxia: Secondary | ICD-10-CM

## 2024-01-23 DIAGNOSIS — E11649 Type 2 diabetes mellitus with hypoglycemia without coma: Secondary | ICD-10-CM | POA: Diagnosis not present

## 2024-01-23 DIAGNOSIS — Z66 Do not resuscitate: Secondary | ICD-10-CM | POA: Diagnosis present

## 2024-01-23 DIAGNOSIS — E662 Morbid (severe) obesity with alveolar hypoventilation: Secondary | ICD-10-CM | POA: Diagnosis present

## 2024-01-23 DIAGNOSIS — E7220 Disorder of urea cycle metabolism, unspecified: Secondary | ICD-10-CM | POA: Diagnosis not present

## 2024-01-23 DIAGNOSIS — Z7401 Bed confinement status: Secondary | ICD-10-CM | POA: Diagnosis not present

## 2024-01-23 DIAGNOSIS — E1159 Type 2 diabetes mellitus with other circulatory complications: Secondary | ICD-10-CM | POA: Diagnosis present

## 2024-01-23 DIAGNOSIS — F0390 Unspecified dementia without behavioral disturbance: Secondary | ICD-10-CM | POA: Diagnosis present

## 2024-01-23 DIAGNOSIS — Z9981 Dependence on supplemental oxygen: Secondary | ICD-10-CM | POA: Diagnosis not present

## 2024-01-23 DIAGNOSIS — J9602 Acute respiratory failure with hypercapnia: Secondary | ICD-10-CM | POA: Diagnosis present

## 2024-01-23 DIAGNOSIS — L89312 Pressure ulcer of right buttock, stage 2: Secondary | ICD-10-CM | POA: Diagnosis present

## 2024-01-23 DIAGNOSIS — E66813 Obesity, class 3: Secondary | ICD-10-CM | POA: Diagnosis present

## 2024-01-23 DIAGNOSIS — Z743 Need for continuous supervision: Secondary | ICD-10-CM | POA: Diagnosis not present

## 2024-01-23 DIAGNOSIS — E785 Hyperlipidemia, unspecified: Secondary | ICD-10-CM | POA: Diagnosis present

## 2024-01-23 DIAGNOSIS — I152 Hypertension secondary to endocrine disorders: Secondary | ICD-10-CM | POA: Diagnosis present

## 2024-01-23 DIAGNOSIS — L89322 Pressure ulcer of left buttock, stage 2: Secondary | ICD-10-CM | POA: Diagnosis present

## 2024-01-23 DIAGNOSIS — J9622 Acute and chronic respiratory failure with hypercapnia: Secondary | ICD-10-CM | POA: Diagnosis present

## 2024-01-23 DIAGNOSIS — E8729 Other acidosis: Secondary | ICD-10-CM | POA: Diagnosis present

## 2024-01-23 DIAGNOSIS — G9341 Metabolic encephalopathy: Secondary | ICD-10-CM | POA: Diagnosis not present

## 2024-01-23 DIAGNOSIS — Z794 Long term (current) use of insulin: Secondary | ICD-10-CM | POA: Diagnosis not present

## 2024-01-23 LAB — ECHOCARDIOGRAM COMPLETE
AR max vel: 2.74 cm2
AV Area VTI: 2.75 cm2
AV Area mean vel: 2.77 cm2
AV Mean grad: 10.3 mmHg
AV Peak grad: 22.9 mmHg
Ao pk vel: 2.39 m/s
Area-P 1/2: 3.87 cm2
Calc EF: 60.9 %
Height: 63 in
MV M vel: 4.76 m/s
MV Peak grad: 90.6 mmHg
MV VTI: 2.68 cm2
S' Lateral: 3.6 cm
Single Plane A2C EF: 59.3 %
Single Plane A4C EF: 60.9 %
Weight: 3840 [oz_av]

## 2024-01-23 LAB — BASIC METABOLIC PANEL WITH GFR
Anion gap: 10 (ref 5–15)
Anion gap: 9 (ref 5–15)
BUN: 27 mg/dL — ABNORMAL HIGH (ref 8–23)
BUN: 28 mg/dL — ABNORMAL HIGH (ref 8–23)
CO2: 28 mmol/L (ref 22–32)
CO2: 33 mmol/L — ABNORMAL HIGH (ref 22–32)
Calcium: 10 mg/dL (ref 8.9–10.3)
Calcium: 14.3 mg/dL (ref 8.9–10.3)
Chloride: 96 mmol/L — ABNORMAL LOW (ref 98–111)
Chloride: 97 mmol/L — ABNORMAL LOW (ref 98–111)
Creatinine, Ser: 1.09 mg/dL — ABNORMAL HIGH (ref 0.44–1.00)
Creatinine, Ser: 1.18 mg/dL — ABNORMAL HIGH (ref 0.44–1.00)
GFR, Estimated: 46 mL/min — ABNORMAL LOW (ref >60)
GFR, Estimated: 50 mL/min — ABNORMAL LOW (ref >60)
Glucose, Bld: 115 mg/dL — ABNORMAL HIGH (ref 70–99)
Glucose, Bld: 132 mg/dL — ABNORMAL HIGH (ref 70–99)
Potassium: 5.7 mmol/L — ABNORMAL HIGH (ref 3.5–5.1)
Potassium: 6.1 mmol/L — ABNORMAL HIGH (ref 3.5–5.1)
Sodium: 135 mmol/L (ref 135–145)
Sodium: 139 mmol/L (ref 135–145)

## 2024-01-23 LAB — HEPATIC FUNCTION PANEL
ALT: 6 U/L (ref 0–44)
AST: 11 U/L — ABNORMAL LOW (ref 15–41)
Albumin: 3.3 g/dL — ABNORMAL LOW (ref 3.5–5.0)
Alkaline Phosphatase: 51 U/L (ref 38–126)
Bilirubin, Direct: 0.1 mg/dL (ref 0.0–0.2)
Indirect Bilirubin: 0.1 mg/dL — ABNORMAL LOW (ref 0.3–0.9)
Total Bilirubin: 0.2 mg/dL (ref 0.0–1.2)
Total Protein: 5.8 g/dL — ABNORMAL LOW (ref 6.5–8.1)

## 2024-01-23 LAB — BLOOD GAS, ARTERIAL
Acid-Base Excess: 10.5 mmol/L — ABNORMAL HIGH (ref 0.0–2.0)
Bicarbonate: 38.1 mmol/L — ABNORMAL HIGH (ref 20.0–28.0)
Delivery systems: POSITIVE
Drawn by: 422461
Expiratory PAP: 6 cmH2O
FIO2: 40 %
Inspiratory PAP: 18 cmH2O
Mode: POSITIVE
O2 Saturation: 100 %
Patient temperature: 34.6
pCO2 arterial: 57 mmHg — ABNORMAL HIGH (ref 32–48)
pH, Arterial: 7.42 (ref 7.35–7.45)
pO2, Arterial: 93 mmHg (ref 83–108)

## 2024-01-23 LAB — CBC
HCT: 29.7 % — ABNORMAL LOW (ref 36.0–46.0)
Hemoglobin: 8.1 g/dL — ABNORMAL LOW (ref 12.0–15.0)
MCH: 29.2 pg (ref 26.0–34.0)
MCHC: 27.3 g/dL — ABNORMAL LOW (ref 30.0–36.0)
MCV: 107.2 fL — ABNORMAL HIGH (ref 80.0–100.0)
Platelets: 133 K/uL — ABNORMAL LOW (ref 150–400)
RBC: 2.77 MIL/uL — ABNORMAL LOW (ref 3.87–5.11)
RDW: 13.3 % (ref 11.5–15.5)
WBC: 3.6 K/uL — ABNORMAL LOW (ref 4.0–10.5)
nRBC: 0 % (ref 0.0–0.2)

## 2024-01-23 LAB — GLUCOSE, CAPILLARY
Glucose-Capillary: 106 mg/dL — ABNORMAL HIGH (ref 70–99)
Glucose-Capillary: 111 mg/dL — ABNORMAL HIGH (ref 70–99)
Glucose-Capillary: 125 mg/dL — ABNORMAL HIGH (ref 70–99)
Glucose-Capillary: 61 mg/dL — ABNORMAL LOW (ref 70–99)
Glucose-Capillary: 66 mg/dL — ABNORMAL LOW (ref 70–99)
Glucose-Capillary: 96 mg/dL (ref 70–99)
Glucose-Capillary: 99 mg/dL (ref 70–99)
Glucose-Capillary: 99 mg/dL (ref 70–99)

## 2024-01-23 LAB — TROPONIN T, HIGH SENSITIVITY: Troponin T High Sensitivity: 20 ng/L — ABNORMAL HIGH (ref 0–19)

## 2024-01-23 LAB — PROTIME-INR
INR: 1 (ref 0.8–1.2)
Prothrombin Time: 14.1 s (ref 11.4–15.2)

## 2024-01-23 LAB — BLOOD GAS, VENOUS
Acid-Base Excess: 7.1 mmol/L — ABNORMAL HIGH (ref 0.0–2.0)
Bicarbonate: 39.6 mmol/L — ABNORMAL HIGH (ref 20.0–28.0)
O2 Saturation: 90.3 %
Patient temperature: 37
pCO2, Ven: 106 mmHg (ref 44–60)
pH, Ven: 7.18 — CL (ref 7.25–7.43)
pO2, Ven: 52 mmHg — ABNORMAL HIGH (ref 32–45)

## 2024-01-23 LAB — APTT: aPTT: 31 s (ref 24–36)

## 2024-01-23 LAB — POTASSIUM: Potassium: 5.3 mmol/L — ABNORMAL HIGH (ref 3.5–5.1)

## 2024-01-23 LAB — MRSA NEXT GEN BY PCR, NASAL: MRSA by PCR Next Gen: DETECTED — AB

## 2024-01-23 MED ORDER — SERTRALINE HCL 50 MG PO TABS
25.0000 mg | ORAL_TABLET | Freq: Every day | ORAL | Status: DC
Start: 2024-01-23 — End: 2024-01-26
  Administered 2024-01-23 – 2024-01-26 (×4): 25 mg via ORAL
  Filled 2024-01-23 (×4): qty 1

## 2024-01-23 MED ORDER — SODIUM ZIRCONIUM CYCLOSILICATE 5 G PO PACK
5.0000 g | PACK | Freq: Once | ORAL | Status: AC
  Administered 2024-01-23: 5 g via ORAL
  Filled 2024-01-23: qty 1

## 2024-01-23 MED ORDER — FLUTICASONE FUROATE-VILANTEROL 200-25 MCG/ACT IN AEPB
1.0000 | INHALATION_SPRAY | Freq: Every day | RESPIRATORY_TRACT | Status: DC
  Administered 2024-01-23 – 2024-01-26 (×4): 1 via RESPIRATORY_TRACT
  Filled 2024-01-23: qty 28

## 2024-01-23 MED ORDER — DOCUSATE SODIUM 100 MG PO CAPS: 100.0000 mg | ORAL_CAPSULE | Freq: Two times a day (BID) | ORAL | Status: DC | PRN

## 2024-01-23 MED ORDER — ENOXAPARIN SODIUM 40 MG/0.4ML IJ SOSY
40.0000 mg | PREFILLED_SYRINGE | INTRAMUSCULAR | Status: DC
  Administered 2024-01-23: 40 mg via SUBCUTANEOUS
  Filled 2024-01-23: qty 0.4

## 2024-01-23 MED ORDER — DEXTROSE 50 % IV SOLN
12.5000 g | INTRAVENOUS | Status: AC
  Administered 2024-01-23: 12.5 g via INTRAVENOUS

## 2024-01-23 MED ORDER — AMLODIPINE BESYLATE 10 MG PO TABS
10.0000 mg | ORAL_TABLET | Freq: Every day | ORAL | Status: DC
Start: 2024-01-23 — End: 2024-01-26
  Administered 2024-01-23 – 2024-01-26 (×4): 10 mg via ORAL
  Filled 2024-01-23 (×4): qty 1

## 2024-01-23 MED ORDER — IPRATROPIUM-ALBUTEROL 0.5-2.5 (3) MG/3ML IN SOLN
3.0000 mL | Freq: Four times a day (QID) | RESPIRATORY_TRACT | Status: DC | PRN
  Administered 2024-01-24: 3 mL via RESPIRATORY_TRACT

## 2024-01-23 MED ORDER — POLYETHYLENE GLYCOL 3350 17 G PO PACK: 17.0000 g | PACK | Freq: Every day | ORAL | Status: DC | PRN

## 2024-01-23 MED ORDER — LOSARTAN POTASSIUM 50 MG PO TABS
50.0000 mg | ORAL_TABLET | Freq: Every day | ORAL | Status: DC
Start: 2024-01-23 — End: 2024-01-23
  Administered 2024-01-23: 50 mg via ORAL
  Filled 2024-01-23: qty 1

## 2024-01-23 MED ORDER — CHLORHEXIDINE GLUCONATE CLOTH 2 % EX PADS
6.0000 | MEDICATED_PAD | Freq: Every day | CUTANEOUS | Status: DC
  Administered 2024-01-23 – 2024-01-25 (×3): 6 via TOPICAL

## 2024-01-23 MED ORDER — DEXTROSE 50 % IV SOLN
INTRAVENOUS | Status: AC
  Filled 2024-01-23: qty 50

## 2024-01-23 MED ORDER — HYDRALAZINE HCL 50 MG PO TABS
50.0000 mg | ORAL_TABLET | Freq: Four times a day (QID) | ORAL | Status: DC
  Administered 2024-01-23 – 2024-01-26 (×13): 50 mg via ORAL
  Filled 2024-01-23 (×13): qty 1

## 2024-01-23 MED ORDER — ROSUVASTATIN CALCIUM 5 MG PO TABS
5.0000 mg | ORAL_TABLET | Freq: Every day | ORAL | Status: DC
  Administered 2024-01-23 – 2024-01-25 (×3): 5 mg via ORAL
  Filled 2024-01-23 (×3): qty 1

## 2024-01-23 MED ORDER — MUPIROCIN 2 % EX OINT
1.0000 | TOPICAL_OINTMENT | Freq: Two times a day (BID) | CUTANEOUS | Status: DC
  Administered 2024-01-23 – 2024-01-26 (×7): 1 via NASAL
  Filled 2024-01-23: qty 22

## 2024-01-23 MED ORDER — ONDANSETRON HCL 4 MG/2ML IJ SOLN: 4.0000 mg | Freq: Four times a day (QID) | INTRAMUSCULAR | Status: DC | PRN

## 2024-01-23 MED ORDER — INSULIN ASPART 100 UNIT/ML IJ SOLN
0.0000 [IU] | INTRAMUSCULAR | Status: DC
  Administered 2024-01-23: 1 [IU] via SUBCUTANEOUS
  Administered 2024-01-25: 2 [IU] via SUBCUTANEOUS
  Filled 2024-01-23: qty 0.09

## 2024-01-23 MED ORDER — PANTOPRAZOLE SODIUM 40 MG PO TBEC
40.0000 mg | DELAYED_RELEASE_TABLET | Freq: Every day | ORAL | Status: DC
  Administered 2024-01-23 – 2024-01-26 (×4): 40 mg via ORAL
  Filled 2024-01-23 (×4): qty 1

## 2024-01-23 MED ORDER — DEXTROSE 50 % IV SOLN
12.5000 g | INTRAVENOUS | Status: AC
  Administered 2024-01-23: 12.5 g via INTRAVENOUS
  Filled 2024-01-23: qty 50

## 2024-01-23 MED ORDER — ORAL CARE MOUTH RINSE
15.0000 mL | OROMUCOSAL | Status: DC
  Administered 2024-01-23 – 2024-01-26 (×12): 15 mL via OROMUCOSAL

## 2024-01-23 MED ORDER — IPRATROPIUM-ALBUTEROL 0.5-2.5 (3) MG/3ML IN SOLN
RESPIRATORY_TRACT | Status: AC
  Filled 2024-01-23: qty 3

## 2024-01-23 MED ORDER — ORAL CARE MOUTH RINSE: 15.0000 mL | OROMUCOSAL | Status: DC | PRN

## 2024-01-23 MED ORDER — APIXABAN 5 MG PO TABS
5.0000 mg | ORAL_TABLET | Freq: Two times a day (BID) | ORAL | Status: DC
  Administered 2024-01-23 – 2024-01-26 (×6): 5 mg via ORAL
  Filled 2024-01-23 (×6): qty 1

## 2024-01-23 MED ORDER — DEXTROSE IN LACTATED RINGERS 5 % IV SOLN: INTRAVENOUS | Status: DC

## 2024-01-23 MED ORDER — IPRATROPIUM-ALBUTEROL 0.5-2.5 (3) MG/3ML IN SOLN
3.0000 mL | Freq: Four times a day (QID) | RESPIRATORY_TRACT | Status: DC
  Administered 2024-01-23 – 2024-01-24 (×6): 3 mL via RESPIRATORY_TRACT
  Filled 2024-01-23 (×7): qty 3

## 2024-01-23 NOTE — Plan of Care (Signed)
  Problem: Skin Integrity: Goal: Risk for impaired skin integrity will decrease Outcome: Not Progressing   Problem: Tissue Perfusion: Goal: Adequacy of tissue perfusion will improve Outcome: Not Progressing   Problem: Clinical Measurements: Goal: Respiratory complications will improve Outcome: Not Progressing Goal: Cardiovascular complication will be avoided Outcome: Not Progressing   Problem: Activity: Goal: Risk for activity intolerance will decrease Outcome: Not Progressing   Problem: Nutrition: Goal: Adequate nutrition will be maintained Outcome: Not Progressing   Problem: Skin Integrity: Goal: Risk for impaired skin integrity will decrease Outcome: Not Progressing

## 2024-01-23 NOTE — Progress Notes (Signed)
Transported on BiPAP to ICU/SD without event.

## 2024-01-23 NOTE — ED Notes (Signed)
 Critical care at bedside at this time. Informed of critical ca 14.3 and k 6.1

## 2024-01-23 NOTE — H&P (Addendum)
 NAME:  Elizabeth [name], MRN:  [id number], DOB:  06-Mar-1941, LOS: 0 ADMISSION DATE:  01/22/2024, CONSULTATION DATE:  01/22/24 REFERRING MD:  EDP, CHIEF COMPLAINT:  acute metabolic encephalopathy   History of Present Illness:  83 yo female presented with confusion  from SNF that was relatively progressive in onset, starting over past few days. Found to have CO of >100 started on NIV. When I assessed pt however, NIV mask was partially in place and continued to require readjustment during my eval. Larger mask requested to RN and EDP to ensure fit. Pt does arouse but drifts back to sleep quickly, is maintaining airway and follows commands when awake.   Pt does endorse she has felt more sleepy and not herself the past few days, denies any other associated symptoms. 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 rate (!) 21, height 5' 3 (1.6 m), SpO2 90%.    FiO2 (%):  [40 %] 40 %   Intake/Output Summary (Last 24 hours) at 01/23/2024 0309 Last data filed at 01/23/2024 0242 Gross per 24 hour  Intake 400 ml  Output 35 ml  Net 365 ml   There were no vitals filed for this visit.  Examination: General: somnolent but arousable, follows commands then drifts back to sleep NIV in place HENT: ncat eomi, perrla mmdry but pink Lungs: diminished bilaterally Cardiovascular: rrr Abdomen: soft nt, nd bs+ Extremities: no c/c/e Neuro: minimal movement in b/l LE  but equal b/l upper movement and 4/5 strength in upper GU: deferred  Resolved problem list   Assessment and Plan  Acute hypercapneic resp failure Chronic hypoxic resp failure (on 3L baseline) Acute exacerbation HFrEF Hyperkalemia T2dm well controlled AOCD Metabolic encephalopathy Hyperammonemia hypercalcemia -pt needs more appropriatly fitted NIV mask to ensure ventilation otherwise high risk intubation.  -lactulose when able to take PT safely -trend K, repeat pending -monitor h&h -transfuse <7 -suspect encephalopathy will improve once metabolic derangements improve  -ssi -duonebs, pulm hygiene -ua negative for acute infection -BNP 3135, diurese as able with renal function, given 20mg  lasix , f/u I/O -echo pending -send coags and lft at this time with elevated ammonia -with snf residence if sepsis enters differential enters (low wbc, no fever, no lactate) would broaden from ctx to vanc/cefepime   -monitor calcium  in light of renal function and needs diuresiss  Labs   CBC: Recent Labs  Lab 01/22/24 2040  WBC 4.7  NEUTROABS 3.6  HGB 9.2*  HCT 34.5*  MCV 109.9*  PLT 186    Basic Metabolic Panel: Recent Labs  Lab 01/22/24 2040 01/22/24 2334  NA 135 135  K 6.7* 6.1*  CL 96* 96*  CO2 30 28  GLUCOSE 119* 132*  BUN 26* 27*  CREATININE 1.26* 1.18*  CALCIUM  10.0 14.3*  MG 2.3  --    GFR: CrCl cannot be calculated (Unknown ideal weight.). Recent Labs  Lab 01/22/24 2040 01/22/24 2349  WBC 4.7  --   LATICACIDVEN  --  1.4    Liver  Function Tests: Recent Labs  Lab 01/22/24 2040  AST 16  ALT 7  ALKPHOS 59  BILITOT 0.3  PROT 7.0  ALBUMIN 3.8   No results for input(s): LIPASE, AMYLASE in the last 168 hours. Recent Labs  Lab 01/22/24 2107  AMMONIA 85*    ABG    Component Value Date/Time   PHART 7.41 09/14/2023 1749   PCO2ART 81 (HH) 09/14/2023 1749   PO2ART 67 (L) 09/14/2023 1749   HCO3 39.6 (H) 01/22/2024 2334   TCO2 40 (H) 12/05/2023 0425    O2SAT 90.3 01/22/2024 2334     Coagulation Profile: No results for input(s): INR, PROTIME in the last 168 hours.  Cardiac Enzymes: No results for input(s): CKTOTAL, CKMB, CKMBINDEX, TROPONINI in the last 168 hours.  HbA1C: Hgb A1c MFr Bld  Date/Time Value Ref Range Status  09/15/2023 03:31 AM 4.5 (L) 4.8 - 5.6 % Final    Comment:    (NOTE) Pre diabetes:          5.7%-6.4%  Diabetes:              >6.4%  Glycemic control for   <7.0% adults with diabetes   08/15/2009 11:44 AM (H) <5.7 % Final   7.4 (NOTE)                                                                       According to the ADA Clinical Practice Recommendations for 2011, when HbA1c is used as a screening test:   >=6.5%   Diagnostic of Diabetes Mellitus           (if abnormal result  is confirmed)  5.7-6.4%   Increased risk of developing Diabetes Mellitus  References:Diagnosis and Classification of Diabetes Mellitus,Diabetes Care,2011,34(Suppl 1):S62-S69 and Standards of Medical Care in         Diabetes - 2011,Diabetes Care,2011,34  (Suppl 1):S11-S61.    CBG: Recent Labs  Lab 01/22/24 2045 01/22/24 2347  GLUCAP 125* 122*    Review of Systems:   As per HPI  Past Medical History:  She,  has a past medical history of (HFpEF) heart failure with preserved ejection fraction (HCC) (12/24/2022), Arthritis, Asthma, Breast mass in female, Bronchitis, Bronchitis, Constipation, Cough, GERD (gastroesophageal reflux disease), Hypertension associated with diabetes (HCC), Type 2 diabetes mellitus (HCC), and Wheezing.   Surgical History:   Past Surgical History:  Procedure Laterality Date   ABDOMINAL HYSTERECTOMY  1996   CYSTECTOMY  1980's   back of neck   THYROID  SURGERY  2001     Social History:   reports that she quit smoking about 60 years ago. Her smoking use included cigarettes. She has never used smokeless tobacco. She reports that she does not drink alcohol  and does not use drugs.   Family  History:  Her family history includes Cancer in her maternal uncle; Diabetes in her mother; Heart disease in her mother.   Allergies No Known Allergies   Home Medications  Prior to Admission medications   Medication Sig Start Date End Date Taking? Authorizing Provider  acetaminophen  (TYLENOL ) 500 MG tablet Take 500 mg by mouth 3 (three) times daily.    [provider]  albuterol  (PROVENTIL  HFA;VENTOLIN  HFA) 108 (90 BASE) MCG/ACT inhaler Inhale 2  puffs into the lungs every 6 (six) hours as needed for shortness of breath.    [provider]  albuterol  (PROVENTIL ) (2.5 MG/3ML) 0.083% nebulizer solution Take 2.5 mg by nebulization every 6 (six) hours.    [provider]  amLODipine  (NORVASC ) 10 MG tablet Take 1 tablet (10 mg total) by mouth daily. 12/08/23   Dennise Lavada POUR, MD  apixaban  (ELIQUIS ) 5 MG TABS tablet Take 1 tablet (5 mg total) by mouth 2 (two) times daily. 04/28/23   Amin, Ankit C, MD  bisacodyl  (DULCOLAX) 5 MG EC tablet Take 2 tablets (10 mg total) by mouth daily as needed for moderate constipation or severe constipation. 04/28/23   Amin, Ankit C, MD  Cetirizine  HCl 10 MG CAPS Take 10 mg by mouth daily.    [provider]  cyanocobalamin  (VITAMIN B12) 1000 MCG tablet Take 1,000 mcg by mouth daily.    [provider]  diclofenac  Sodium (VOLTAREN ) 1 % GEL Apply 4 g topically 4 (four) times daily. Apply to bilateral knees 04/24/21   [provider]  donepezil  (ARICEPT ) 5 MG tablet Take 1 tablet (5 mg total) by mouth daily. Patient taking differently: Take 5 mg by mouth at bedtime. 07/30/23   Wertman, Sara E, PA-C  fluticasone  (FLONASE ) 50 MCG/ACT nasal spray PLACE 1 SPRAY INTO BOTH NOSTRILS 2 (TWO) TIMES DAILY 09/13/22   Shellia Oh, MD  fluticasone -salmeterol (ADVAIR) 250-50 MCG/ACT AEPB Inhale 1 puff into the lungs in the morning and at bedtime.    [provider]  furosemide  (LASIX ) 40 MG tablet Take 1 tablet (40 mg total) by  mouth daily as needed. Weigh yourself daily.  Take one dose by mouth if your weight increases by more than 2 pounds in one day or 5 pounds in one week. 09/18/23 09/17/24  Shalhoub, Zachary PARAS, MD  hydrALAZINE  (APRESOLINE ) 50 MG tablet Take 50 mg by mouth every 6 (six) hours. 04/10/23   [provider]  insulin  aspart (NOVOLOG ) 100 UNIT/ML injection Before each meal 3 times a day, 140-199 - 2 units, 200-250 - 4 units, 251-299 - 6 units,  300-349 - 8 units,  350 or above call your MD. 12/08/23   Dennise Lavada POUR, MD  losartan  (COZAAR ) 50 MG tablet Take 1 tablet (50 mg total) by mouth daily. 12/08/23   Singh, Prashant K, MD  melatonin 3 MG TABS tablet Take 6 mg by mouth at bedtime.    [provider]  Menthol, Topical Analgesic, (BIOFREEZE COOL THE PAIN EX) Apply 1 application  topically in the morning and at bedtime. Apply to both hands, ankles    [provider]  Multiple Vitamins-Minerals (CENTRUM SILVER PO) Take 1 tablet by mouth daily.    [provider]  oxybutynin  (DITROPAN -XL) 10 MG 24 hr tablet Take 10 mg by mouth daily. 12/09/22   [provider]  OXYGEN  Inhale 2-3 L/min into the lungs See admin instructions. When using CPAP and night, bleeding O2 3L. Oxygen  via nasal cannula at 2 L/min as needed for shortness of breath or sats < 90%.    [provider]  pantoprazole  (PROTONIX ) 40 MG tablet Take 40 mg by mouth daily.    [provider]  potassium chloride  (KLOR-CON ) 10 MEQ tablet Take 10 mEq by mouth daily.    [provider]  Propylene Glycol (SYSTANE COMPLETE) 0.6 % SOLN Place 1 drop into both eyes every 6 (six) hours as needed (dry eyes).    [provider]  rosuvastatin  (  CRESTOR ) 5 MG tablet Take 5 mg by mouth at bedtime. 12/09/22   [provider]  senna (SENOKOT) 8.6 MG tablet Take 17.2 mg by mouth daily.    [provider]  sertraline  (ZOLOFT ) 25 MG tablet Take 25 mg by mouth daily.    [provider]  sodium phosphate (FLEET) ENEM Place 1 enema rectally once as needed (constipation).    [provider]  UNABLE TO FIND Inhale 1 Device into the lungs See admin instructions. Apply CPAP settings 10/5 with bleeding O2 3L into CPAP at bedtime and as needed at bedtime for shortness of breath, wheezing. Must document placement in progress note    [provider]     Critical care time: 

## 2024-01-23 NOTE — Progress Notes (Addendum)
   NAME:  Elizabeth [name], MRN:  [id number], DOB:  1940-12-18, LOS: 0 ADMISSION DATE:  01/22/2024, CONSULTATION DATE:  01/22/24 REFERRING MD:  EDP, CHIEF COMPLAINT:  acute metabolic encephalopathy   History of Present Illness:  83 yo female presented with confusion  from SNF that was relatively progressive in onset, starting over past few days. Found to have CO of >100 started on NIV. When I assessed pt however, NIV mask was partially in place and continued to require readjustment during my eval. Larger mask requested to RN and EDP to ensure fit. Pt does arouse but drifts back to sleep quickly, is maintaining airway and follows commands when awake.   Pt does endorse she has felt more sleepy and not herself the past few days, denies any other associated symptoms. 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 rate 13, height 5' 3 (1.6 m), weight 108.9 kg, SpO2 100%.    FiO2 (%):  [40 %] 40 %   Intake/Output Summary (Last 24 hours) at 01/23/2024 0842 Last data filed at 01/23/2024 0600 Gross per 24 hour  Intake 451.13 ml  Output 35 ml  Net 416.13 ml   Filed Weights   01/23/24 0415  Weight: 108.9 kg    Examination: General: somnolent but arousable, follows commands then drifts back to sleep NIV in place HENT: ncat eomi, perrla mmdry but pink Lungs:  diminished bilaterally Cardiovascular: rrr Abdomen: soft nt, nd bs+ Extremities: no c/c/e Neuro: minimal movement in b/l LE but equal b/l upper movement and 4/5 strength in upper GU: deferred  Resolved problem list   Assessment and Plan  Acute hypercapneic resp failure Chronic hypoxic resp failure (on 3L baseline) Decompensated OSA on CPAP: Reportedly not wearing CPAP at SNF - Looking great off BiPAP - Continue supplemental O2 at home flow rate - at bedtime CPAP - Duonebs - Resume home Advair ([employer] formulary)  Acute on chronic HFpEF - Telemetry monitoring - Echo pending - s/p lasix  - now on home O2 so will hold further diuresis at this time - Resume home losartan , amlodipine , hydralazine   Hyperkalemia - Lokelma  x 1  T2dm well controlled - CBG and SSI - D5 started overnight for a hypoglycemic episode - Start carb modified diet and hopefully we can dc fluid.   Metabolic encephalopathy: secondary to CO2 narcosis. Improved.  - at bedtime CPAP  Hyperammonemia - ensure clearing - Defer lactulose as mental status has improved   Time spent for EMR review, patient evaluation, and medical decision making: 38 minutes      Deward Eastern, AGACNP-BC [facility]  See Amion for personal pager PCCM on call pager [phone] until 7pm. Please call Elink 7p-7a. [phone]  01/23/2024 8:52 AM

## 2024-01-23 NOTE — Progress Notes (Signed)
 eLink Physician-Brief Progress Note Patient Name: Elizabeth [name] DOB: 02/21/1941 MRN: [id number]   Date of Service  01/23/2024  HPI/Events of Note  83 yr old female with OHS/OSA, chronic hypoxemia on 3LNC, and morbid obesity presents with hypercapneic resp failure which has improved with bipap.  Cxr suggests volume overload.  Labs notable for probnp 3135, hgb 8.1, ammonia 85.  Treated with bipap, lasix , empiric abx.  Lactulose ordered.  eICU Interventions  Chart reviewed     Intervention Category Evaluation Type: New Patient Evaluation  [name], P 01/23/2024, 5:15 AM

## 2024-01-23 NOTE — Progress Notes (Signed)
 Date and time results received: 01/23/24, 0333  Test: CBG  Critical Value: 66 Name of Provider Notified: Elink RN Actions taken: Gave 12.5g of D50 and recheck after CBG=106

## 2024-01-23 NOTE — ED Notes (Signed)
 CRITICAL VALUE STICKER  CRITICAL VALUE: VBG pH 7.18 and pCO2 106  RECEIVER (on-site recipient of call): E Osorio RN   DATE & TIME NOTIFIED: 10/3 @ 0008  MD NOTIFIED: EDP [place]   TIME OF NOTIFICATION: 10/3 0009  RESPONSE: Continue BiPap

## 2024-01-24 DIAGNOSIS — J9602 Acute respiratory failure with hypercapnia: Secondary | ICD-10-CM | POA: Diagnosis not present

## 2024-01-24 LAB — GLUCOSE, CAPILLARY
Glucose-Capillary: 88 mg/dL (ref 70–99)
Glucose-Capillary: 75 mg/dL (ref 70–99)
Glucose-Capillary: 109 mg/dL — ABNORMAL HIGH (ref 70–99)
Glucose-Capillary: 109 mg/dL — ABNORMAL HIGH (ref 70–99)
Glucose-Capillary: 92 mg/dL (ref 70–99)

## 2024-01-24 LAB — CBC
HCT: 24.8 % — ABNORMAL LOW (ref 36.0–46.0)
Hemoglobin: 7.5 g/dL — ABNORMAL LOW (ref 12.0–15.0)
MCH: 30.1 pg (ref 26.0–34.0)
MCHC: 30.2 g/dL (ref 30.0–36.0)
MCV: 99.6 fL (ref 80.0–100.0)
Platelets: 172 K/uL (ref 150–400)
RBC: 2.49 MIL/uL — ABNORMAL LOW (ref 3.87–5.11)
RDW: 14.4 % (ref 11.5–15.5)
WBC: 6.9 K/uL (ref 4.0–10.5)
nRBC: 0 % (ref 0.0–0.2)

## 2024-01-24 LAB — PHOSPHORUS: Phosphorus: 1.8 mg/dL — ABNORMAL LOW (ref 2.5–4.6)

## 2024-01-24 LAB — COMPREHENSIVE METABOLIC PANEL WITH GFR
ALT: <5 U/L (ref 0–44)
AST: 11 U/L — ABNORMAL LOW (ref 15–41)
Albumin: 3.3 g/dL — ABNORMAL LOW (ref 3.5–5.0)
Alkaline Phosphatase: 46 U/L (ref 38–126)
Anion gap: 6 (ref 5–15)
BUN: 24 mg/dL — ABNORMAL HIGH (ref 8–23)
CO2: 34 mmol/L — ABNORMAL HIGH (ref 22–32)
Calcium: 9.5 mg/dL (ref 8.9–10.3)
Chloride: 96 mmol/L — ABNORMAL LOW (ref 98–111)
Creatinine, Ser: 0.72 mg/dL (ref 0.44–1.00)
GFR, Estimated: >60 mL/min (ref >60)
Glucose, Bld: 102 mg/dL — ABNORMAL HIGH (ref 70–99)
Potassium: 5 mmol/L (ref 3.5–5.1)
Sodium: 136 mmol/L (ref 135–145)
Total Bilirubin: <0.2 mg/dL (ref 0.0–1.2)
Total Protein: 5.7 g/dL — ABNORMAL LOW (ref 6.5–8.1)

## 2024-01-24 LAB — AMMONIA: Ammonia: 37 umol/L — ABNORMAL HIGH (ref 9–35)

## 2024-01-24 LAB — MAGNESIUM: Magnesium: 2 mg/dL (ref 1.7–2.4)

## 2024-01-24 MED ORDER — K PHOS MONO-SOD PHOS DI & MONO 155-852-130 MG PO TABS
500.0000 mg | ORAL_TABLET | ORAL | Status: DC
  Administered 2024-01-24: 500 mg via ORAL
  Filled 2024-01-24: qty 2

## 2024-01-24 MED ORDER — LOSARTAN POTASSIUM 50 MG PO TABS
50.0000 mg | ORAL_TABLET | Freq: Every day | ORAL | Status: DC
Start: 2024-01-24 — End: 2024-01-26
  Administered 2024-01-24 – 2024-01-26 (×3): 50 mg via ORAL
  Filled 2024-01-24 (×3): qty 1

## 2024-01-24 MED ORDER — SODIUM PHOSPHATES 45 MMOLE/15ML IV SOLN
30.0000 mmol | Freq: Once | INTRAVENOUS | Status: AC
  Administered 2024-01-24: 30 mmol via INTRAVENOUS
  Filled 2024-01-24: qty 10

## 2024-01-24 MED ORDER — IPRATROPIUM-ALBUTEROL 0.5-2.5 (3) MG/3ML IN SOLN
3.0000 mL | Freq: Three times a day (TID) | RESPIRATORY_TRACT | Status: DC
  Administered 2024-01-24 – 2024-01-26 (×5): 3 mL via RESPIRATORY_TRACT
  Filled 2024-01-24 (×6): qty 3

## 2024-01-24 NOTE — Evaluation (Signed)
 Physical Therapy Evaluation Patient Details Name: Elizabeth [name] MRN: [id number] DOB: [birth year] Today's Date: 01/24/2024  History of Present Illness  Pt is an 83 y/o female admitted 01/23/24 from [facility] SNF due to AMS not acting like herself generalized fatigue, dx with hypercapneic resp failure. xr suggests volume overload. PMH includes heart failure, Arthritis, Asthma, Bronchitis, Dementia, Hypertension associated with diabetes, Type 2 diabetes mellitus, chronic debilitation (WC at baseline), Chronic hypoxic resp failure (3L at baseline).  Clinical Impression  Patient presents with limited mobility possibly not too far from baseline.  Seems at times using lift for OOB at facility and today able to stand step to [facility] with +2 max A and needing lift to recliner after legs went numb while on BSC.  Patient may benefit from skilled PT while in the acute setting for progressing LE strength for transfers.  Recommend return to SNF at d/c.         If plan is discharge home, recommend the following: A lot of help with bathing/dressing/bathroom;Two people to help with walking and/or transfers   Can travel by private vehicle   No    Equipment Recommendations None recommended by PT  Recommendations for Other Services       Functional Status Assessment Patient has had a recent decline in their functional status and/or demonstrates limited ability to make significant improvements in function in a reasonable and predictable amount of time     Precautions / Restrictions Precautions Precautions: Fall      Mobility  Bed Mobility Overal bed mobility: Needs Assistance Bed Mobility: Supine to Sit     Supine to sit: Mod assist, HOB elevated, Used rails     General bed mobility comments: to help lift trunk and scoot hips    Transfers Overall transfer level: Needs assistance Equipment used: 2 person hand held assist Transfers: Sit to/from Stand, Bed to chair/wheelchair/BSC Sit to  Stand: Max assist, +2 physical assistance   Step pivot transfers: Mod assist, +2 physical assistance       General transfer comment: assist to stand from EOB with 2 A to lift and step to [facility] then after on BSC extended time legs felt numb and unable to stand with +2 so used maxisky for [facility] to recliner    Ambulation/Gait                  Stairs            Wheelchair Mobility     Tilt Bed    Modified Rankin (Stroke Patients Only)       Balance Overall balance assessment: Needs assistance   Sitting balance-Leahy Scale: Good     Standing balance support: Bilateral upper extremity supported Standing balance-Leahy Scale: Zero Standing balance comment: UE support and A of 2 for standing                             Pertinent Vitals/Pain Pain Assessment Pain Assessment: Faces Faces Pain Scale: Hurts little more Pain Location: legs numb after on BSC extended time Pain Descriptors / Indicators: Numbness Pain Intervention(s): Monitored during session, Repositioned    Home Living Family/patient expects to be discharged to:: Skilled nursing facility                   Additional Comments: [facility]    Prior Function Prior Level of Function : Needs assist  Mobility Comments: using lift or 2 assist to wheelchair ADLs Comments: can feed herself and help with upper body bathing/dressing     Extremity/Trunk Assessment   Upper Extremity Assessment Upper Extremity Assessment: Defer to OT evaluation    Lower Extremity Assessment Lower Extremity Assessment: Generalized weakness    Cervical / Trunk Assessment Cervical / Trunk Assessment: Kyphotic  Communication        Cognition Arousal: Alert                               Following commands: Intact       Cueing       General Comments      Exercises     Assessment/Plan    PT Assessment Patient needs continued PT services  PT Problem List          PT Treatment Interventions Functional mobility training;Therapeutic activities;Therapeutic exercise;Balance training;Patient/family education;Wheelchair mobility training    PT Goals (Current goals can be found in the Care Plan section)  Acute Rehab PT Goals Patient Stated Goal: return to rehab PT Goal Formulation: With patient Time For Goal Achievement: 02/07/24 Potential to Achieve Goals: Fair    Frequency Min 1X/week     Co-evaluation PT/OT/SLP Co-Evaluation/Treatment: Yes Reason for Co-Treatment: For patient/therapist safety;To address functional/ADL transfers           AM-PAC PT 6 Clicks Mobility  Outcome Measure Help needed turning from your back to your side while in a flat bed without using bedrails?: A Lot Help needed moving from lying on your back to sitting on the side of a flat bed without using bedrails?: A Lot Help needed moving to and from a bed to a chair (including a wheelchair)?: Total Help needed standing up from a chair using your arms (e.g., wheelchair or bedside chair)?: Total Help needed to walk in hospital room?: Total Help needed climbing 3-5 steps with a railing? : Total 6 Click Score: 8    End of Session Equipment Utilized During Treatment: Gait belt   Patient left: in chair;with call bell/phone within reach;with chair alarm set Nurse Communication: Need for lift equipment PT Visit Diagnosis: Muscle weakness (generalized) (M62.81)    Time: 8794-8744 PT Time Calculation (min) (ACUTE ONLY): 50 min   Charges:   PT Evaluation $PT Eval Moderate Complexity: 1 Mod PT Treatments $Therapeutic Activity: 8-22 mins PT General Charges $$ ACUTE PT VISIT: 1 Visit         Micheline Portal, PT Acute Rehabilitation Services Office:[phone] 01/24/2024   Montie Portal 01/24/2024, 1:14 PM

## 2024-01-24 NOTE — Progress Notes (Signed)
   01/24/24 2328  BiPAP/CPAP/SIPAP  BiPAP/CPAP/SIPAP Pt Type Adult  BiPAP/CPAP/SIPAP V60  Mask Type Full face mask  Dentures removed? Not applicable  Mask Size Large  Set Rate 14 breaths/min  Respiratory Rate 22 breaths/min  IPAP 18 cmH20  EPAP 6 cmH2O  FiO2 (%) 40 %  Flow Rate 0 lpm  Minute Ventilation 8.4  Leak 1  Peak Inspiratory Pressure (PIP) 18  Tidal Volume (Vt) 430  Patient Home Machine No  Patient Home Mask No  Patient Home Tubing No  Auto Titrate No  Press High Alarm 30 cmH2O  Press Low Alarm 5 cmH2O  Device Plugged into RED Power Outlet Yes  BiPAP/CPAP /SiPAP Vitals  Pulse Rate 66  Resp (!) 21  SpO2 100 %  MEWS Score/Color  MEWS Score 1  MEWS Score Color Green

## 2024-01-24 NOTE — Progress Notes (Signed)
 [facility] ADULT ICU REPLACEMENT PROTOCOL   The patient does apply for the [facility] Adult ICU Electrolyte Replacment Protocol based on the criteria listed below:   1.Exclusion criteria: TCTS, ECMO, Dialysis, and Myasthenia Gravis patients 2. Is GFR >/= 30 ml/min? Yes.    Patient's GFR today is >60 3. Is SCr </= 2? Yes.   Patient's SCr is 0.72 mg/dL 4. Did SCr increase >/= 0.5 in 24 hours? No. 5.Pt's weight >40kg  Yes.   6. Abnormal electrolyte(s): Phos = 1.8  7. Electrolytes replaced per protocol 8.  Call MD STAT for K+ </= 2.5, Phos </= 1, or Mag </= 1 Physician:  Haze, eMD   Elizabeth [name] 01/24/2024 5:12 AM

## 2024-01-24 NOTE — Progress Notes (Signed)
 PROGRESS NOTE  Elizabeth [name] FMW:[id number] DOB: March 09, 1941 DOA: 01/22/2024 PCP: Albina GORMAN Dine, MD   LOS: 1 day   Brief narrative:  83 yo female with past medical history of congestive heart failure, diabetes mellitus, chronic hypoxic respiratory failure on 3 L of oxygen  at baseline, obstructive sleep apnea presented to hospital from skilled nursing facility with progressive confusion.  In the ED patient was noted to have CO2 level more than 100 and was put on NIV.  Pulmonary was consulted and patient presented to hospital for further evaluation and treatment   Assessment/Plan: Principal Problem:   Acute hypercapnic respiratory failure (HCC) Active Problems:   Obesity hypoventilation syndrome (HCC)  Acute hypercapneic resp failure on Chronic hypoxic resp failure (on 3L baseline) Decompensated OSA on CPAP: Patient has not been wearing CPAP at the skilled nursing facility.  Was on BiPAP initially on presentation the hospital.  Pulmonary on board and recommend auto BiPAP on discharge with plans for.  EPAP 15 IPAP of 20 with pressure support of 4.  Continue DuoNebs and bronchodilators.  Currently on nasal cannula oxygen .  2D echocardiogram shows right ventricular systolic pressure of 62 consistent with severe pulmonary hypertension.  Plan is oxygen  and BiPAP   Metabolic encephalopathy: Secondary to CO2 narcosis.  Has improved.  Continue CPAP at night   Acute on chronic HFpEF 2D echo with LV ejection fraction of 60 to 65%.  Continue to monitor.   Hyperkalemia Received Lokelma  x 1.  Latest potassium of 5.0.  Hypophosphatemia.  Will replenish with sodium phosphate due to potassium levels being on the higher side.  Check levels in AM.    Diabetes mellitus type 2. Continue diabetic diet.  Needed D5 water for hypoglycemia.   Hyperammonemia Mentation has improved at this time.  Defer lactulose.  Debility deconditioning.  Patient is from skilled nursing facility.  DVT prophylaxis: SCDs  Start: 01/23/24 0051 apixaban  (ELIQUIS ) tablet 5 mg   Disposition: Skilled nursing facility likely in 1 to 2 days  Status is: Inpatient Remains inpatient appropriate because: Pending clinical improvement    Code Status:     Code Status: Limited: Do not attempt resuscitation (DNR) -DNR-LIMITED -Do Not Intubate/DNI   Family Communication: None  Consultants: PCCM  Procedures: BiPAP  Anti-infectives:  Completed Rocephin  and Zithromax  Anti-infectives (From admission, onward)    Start     Dose/Rate Route Frequency Ordered Stop   01/22/24 2330  cefTRIAXone  (ROCEPHIN ) 1 g in sodium chloride  0.9 % 100 mL IVPB        1 g 200 mL/hr over 30 Minutes Intravenous  Once 01/22/24 2325 01/23/24 0101   01/22/24 2330  azithromycin (ZITHROMAX) 500 mg in sodium chloride  0.9 % 250 mL IVPB        500 mg 250 mL/hr over 60 Minutes Intravenous  Once 01/22/24 2325 01/23/24 0242        Subjective: Today, patient was seen and examined at bedside.  Patient states that she feels better today.  Has some shortness of breath but denies any pain, nausea, vomiting, fever, chills or rigor.  Has mild cough.  Objective: Vitals:   01/24/24 1400 01/24/24 1457  BP: (!) 162/61   Pulse: (!) 37   Resp: (!) 23   Temp:    SpO2: 97% 98%    Intake/Output Summary (Last 24 hours) at 01/24/2024 1532 Last data filed at 01/24/2024 1300 Gross per 24 hour  Intake 1520.13 ml  Output 3200 ml  Net -1679.87 ml   [employer]  01/23/24 0415  Weight: 108.9 kg   Body mass index is 42.51 kg/m.   Physical Exam: GENERAL: Patient is alert awake and oriented. Not in obvious distress.  Obese build.  Nasal cannula oxygen . HENT: No scleral pallor or icterus. Pupils equally reactive to light. Oral mucosa is moist NECK: is supple, no gross swelling noted. CHEST: Decreased breath sounds bilaterally. CVS: S1 and S2 heard, no murmur. Regular rate and rhythm.  ABDOMEN: Soft, non-tender, bowel sounds are  present. EXTREMITIES: No edema. CNS: Cranial nerves are intact. No focal motor deficits. SKIN: warm and dry without rashes.  Data Review: I have personally reviewed the following laboratory data and studies,  CBC: Recent Labs  Lab 01/22/24 2040 01/23/24 0349 01/24/24 0249  WBC 4.7 3.6* 6.9  NEUTROABS 3.6  --   --   HGB 9.2* 8.1* 7.5*  HCT 34.5* 29.7* 24.8*  MCV 109.9* 107.2* 99.6  PLT 186 133* 172   Basic Metabolic Panel: Recent Labs  Lab 01/22/24 2040 01/22/24 2334 01/23/24 0349 01/23/24 1618 01/24/24 0249  NA 135 135 139  --  136  K 6.7* 6.1* 5.7* 5.3* 5.0  CL 96* 96* 97*  --  96*  CO2 30 28 33*  --  34*  GLUCOSE 119* 132* 115*  --  102*  BUN 26* 27* 28*  --  24*  CREATININE 1.26* 1.18* 1.09*  --  0.72  CALCIUM  10.0 14.3* 10.0  --  9.5  MG 2.3  --   --   --  2.0  PHOS  --   --   --   --  1.8*   Liver Function Tests: Recent Labs  Lab 01/22/24 2040 01/23/24 0349 01/24/24 0249  AST 16 11* 11*  ALT 7 6 <5  ALKPHOS 59 51 46  BILITOT 0.3 0.2 <0.2  PROT 7.0 5.8* 5.7*  ALBUMIN 3.8 3.3* 3.3*   No results for input(s): LIPASE, AMYLASE in the last 168 hours. Recent Labs  Lab 01/22/24 2107 01/24/24 0249  AMMONIA 85* 37*   Cardiac Enzymes: No results for input(s): CKTOTAL, CKMB, CKMBINDEX, TROPONINI in the last 168 hours. BNP (last 3 results) Recent Labs    09/15/23 0331 09/18/23 1155 12/05/23 0925  BNP 313.2* 221.2* 391.2*    ProBNP (last 3 results) Recent Labs    01/22/24 2040  PROBNP 3,135.0*    CBG: Recent Labs  Lab 01/23/24 2032 01/23/24 2356 01/24/24 0441 01/24/24 0840 01/24/24 1308  GLUCAP 125* 96 88 75 109*   Recent Results (from the past 240 hours)  MRSA Next Gen by PCR, Nasal     Status: Abnormal   Collection Time: 01/23/24  2:34 AM   Specimen: Nasal Mucosa; Nasal Swab  Result Value Ref Range Status   MRSA by PCR Next Gen DETECTED (A) NOT DETECTED Final    Comment: CRITICAL RESULT CALLED TO, READ BACK BY AND  VERIFIED WITH: KARAMGWA P. [id number] 01/23/24 MCLEAN K. (NOTE) The GeneXpert MRSA Assay (FDA approved for NASAL specimens only), is one component of a comprehensive MRSA colonization surveillance program. It is not intended to diagnose MRSA infection nor to guide or monitor treatment for MRSA infections. Test performance is not FDA approved in patients less than 69 years old. Performed at [facility], [address], [place], [place] [address]      Studies: ECHOCARDIOGRAM COMPLETE Result Date: 01/23/2024    ECHOCARDIOGRAM REPORT   Patient Name:   Elizabeth [name] Date of Exam: 01/23/2024 Medical Rec #:  [id number]  Height:       63.0 in Accession #:    [id number]      Weight:       240.0 lb Date of Birth:  [birth year]        BSA:          2.089 m Patient Age:    83 years        BP:           124/37 mmHg Patient Gender: F               HR:           66 bpm. Exam Location:  Inpatient Procedure: 2D Echo, Cardiac Doppler and Color Doppler (Both Spectral and Color            Flow Doppler were utilized during procedure). Indications:    CHF I50.9  History:        Patient has prior history of Echocardiogram examinations, most                 recent 09/15/2023. CHF; Risk Factors:Hypertension. H/O Dementia,                 Hyperlipidemia, Chronic hypoxic respiratory failure.  Sonographer:    BERNARDA ROCKS Referring Phys: [id number] JESSICA MARSHALL IMPRESSIONS  1. Left ventricular ejection fraction, by estimation, is 60 to 65%. The left ventricle has normal function. The left ventricle has no regional wall motion abnormalities. Left ventricular diastolic parameters were normal.  2. Right ventricular systolic function is low normal. The right ventricular size is mildly enlarged. There is severely elevated pulmonary artery systolic pressure.  3. Left atrial size was severely dilated.  4. Right atrial size was moderately dilated.  5. Mild mitral valve regurgitation.  6. AV is thickened, calcified with  minimally restricted motion. . The aortic valve is tricuspid. Aortic valve regurgitation is trivial.  7. The inferior vena cava is dilated in size with <50% respiratory variability, suggesting right atrial pressure of 15 mmHg. Comparison(s): The left ventricular function is unchanged. FINDINGS  Left Ventricle: Left ventricular ejection fraction, by estimation, is 60 to 65%. The left ventricle has normal function. The left ventricle has no regional wall motion abnormalities. The left ventricular internal cavity size was normal in size. There is  no left ventricular hypertrophy. Left ventricular diastolic parameters were normal. Right Ventricle: The right ventricular size is mildly enlarged. Right vetricular wall thickness was not assessed. Right ventricular systolic function is low normal. There is severely elevated pulmonary artery systolic pressure. The tricuspid regurgitant velocity is 3.44 m/s, and with an assumed right atrial pressure of 15 mmHg, the estimated right ventricular systolic pressure is 62.3 mmHg. Left Atrium: Left atrial size was severely dilated. Right Atrium: Right atrial size was moderately dilated. Pericardium: There is no evidence of pericardial effusion. Mitral Valve: There is mild thickening of the mitral valve leaflet(s). Mild to moderate mitral annular calcification. Mild mitral valve regurgitation. MV peak gradient, 10.8 mmHg. The mean mitral valve gradient is 4.0 mmHg. Tricuspid Valve: The tricuspid valve is normal in structure. Tricuspid valve regurgitation is mild. Aortic Valve: AV is thickened, calcified with minimally restricted motion. The aortic valve is tricuspid. Aortic valve regurgitation is trivial. Aortic valve mean gradient measures 10.3 mmHg. Aortic valve peak gradient measures 22.9 mmHg. Aortic valve area, by VTI measures 2.75 cm. Pulmonic Valve: The pulmonic valve was grossly normal. Pulmonic valve regurgitation is trivial. No evidence of pulmonic stenosis. Aorta: The  aortic root and ascending  aorta are structurally normal, with no evidence of dilitation. Venous: The inferior vena cava is dilated in size with less than 50% respiratory variability, suggesting right atrial pressure of 15 mmHg. IAS/Shunts: No atrial level shunt detected by color flow Doppler.  LEFT VENTRICLE PLAX 2D LVIDd:         5.40 cm      Diastology LVIDs:         3.60 cm      LV e' medial:    9.14 cm/s LV PW:         1.10 cm      LV E/e' medial:  16.8 LV IVS:        1.00 cm      LV e' lateral:   11.70 cm/s LVOT diam:     2.40 cm      LV E/e' lateral: 13.2 LV SV:         145 LV SV Index:   70 LVOT Area:     4.52 cm  LV Volumes (MOD) LV vol d, MOD A2C: 197.0 ml LV vol d, MOD A4C: 184.0 ml LV vol s, MOD A2C: 80.1 ml LV vol s, MOD A4C: 71.9 ml LV SV MOD A2C:     116.9 ml LV SV MOD A4C:     184.0 ml LV SV MOD BP:      122.5 ml RIGHT VENTRICLE             IVC RV Basal diam:  5.40 cm     IVC diam: 3.10 cm RV S prime:     12.40 cm/s TAPSE (M-mode): 1.9 cm RVSP:           62.3 mmHg LEFT ATRIUM              Index        RIGHT ATRIUM            Index LA diam:        3.40 cm  1.63 cm/m   RA Pressure: 15.00 mmHg LA Vol (A2C):   107.0 ml 51.21 ml/m  RA Area:     22.80 cm LA Vol (A4C):   132.0 ml 63.18 ml/m  RA Volume:   79.40 ml   38.00 ml/m LA Biplane Vol: 124.0 ml 59.35 ml/m  AORTIC VALVE                     PULMONIC VALVE AV Area (Vmax):    2.74 cm      PV Vmax:          1.18 m/s AV Area (Vmean):   2.77 cm      PV Peak grad:     5.6 mmHg AV Area (VTI):     2.75 cm      PR End Diast Vel: 1.29 msec AV Vmax:           239.33 cm/s AV Vmean:          151.000 cm/s AV VTI:            0.529 m AV Peak Grad:      22.9 mmHg AV Mean Grad:      10.3 mmHg LVOT Vmax:         145.00 cm/s LVOT Vmean:        92.600 cm/s LVOT VTI:          0.321 m LVOT/AV VTI ratio: 0.61  AORTA Ao Root diam: 3.30 cm Ao Asc diam:  3.80  cm MITRAL VALVE                TRICUSPID VALVE MV Area (PHT): 3.87 cm     TR Peak grad:   47.3 mmHg MV Area VTI:    2.68 cm     TR Vmax:        344.00 cm/s MV Peak grad:  10.8 mmHg    Estimated RAP:  15.00 mmHg MV Mean grad:  4.0 mmHg     RVSP:           62.3 mmHg MV Vmax:       1.64 m/s MV Vmean:      92.8 cm/s    SHUNTS MV Decel Time: 196 msec     Systemic VTI:  0.32 m MR Peak grad: 90.6 mmHg     Systemic Diam: 2.40 cm MR Vmax:      476.00 cm/s MV E velocity: 154.00 cm/s MV A velocity: 137.00 cm/s MV E/A ratio:  1.12 Vina Gull MD Electronically signed by Vina Gull MD Signature Date/Time: 01/23/2024/5:34:49 PM    Final    CT HEAD WO CONTRAST Result Date: 01/22/2024 CLINICAL DATA:  Mental status change EXAM: CT HEAD WITHOUT CONTRAST TECHNIQUE: Contiguous axial images were obtained from the base of the skull through the vertex without intravenous contrast. RADIATION DOSE REDUCTION: This exam was performed according to the departmental dose-optimization program which includes automated exposure control, adjustment of the mA and/or kV according to patient size and/or use of iterative reconstruction technique. COMPARISON:  MRI 08/06/2022, CT brain 09/14/2023 FINDINGS: Brain: No acute territorial infarction, hemorrhage or intracranial mass. Mild chronic small vessel ischemic changes of the white matter. Nonenlarged ventricles Vascular: No hyperdense vessels.  Carotid vascular calcification Skull: Normal. Negative for fracture or focal lesion. Sinuses/Orbits: Patchy mucosal thickening right ethmoid sinus. Is incompletely visualized complex mass in the right sinus with calcification and hyperdensity, protruding into the right nasal cavity as before. Chronic right maxillary sinus wall thickening. Other: None IMPRESSION: 1. No CT evidence for acute intracranial abnormality. 2. Mild chronic small vessel ischemic changes of the white matter. 3. Incompletely visualized complex mass in the right sinus with calcification and hyperdensity, protruding into the right nasal cavity as before. Reference facial CT 10/14/2022 Electronically  Signed   By: Luke Bun M.D.   On: 01/22/2024 21:57   DG Chest Port 1 View Result Date: 01/22/2024 CLINICAL DATA:  Fatigue, weakness, and lethargy. EXAM: PORTABLE CHEST 1 VIEW COMPARISON:  Portable chest 12/06/2023. FINDINGS: The heart is moderately enlarged. Stable mediastinum with aortic atherosclerosis and tortuosity. There is central vascular engorgement, perihilar ground-glass and septal edema, and moderate pleural effusions. Findings consistent with CHF or fluid overload with similar findings previously. There are patchy opacities in the lower lung fields which could be atelectasis or consolidation. Airspace edema is also possible. The upper lung fields without focal infiltrates. Multiple surgical clips are again noted at the neck base. No new osseous findings.  Advanced thoracic spondylosis. IMPRESSION: 1. Cardiomegaly with central vascular engorgement, perihilar ground-glass and septal edema, and moderate pleural effusions. Findings consistent with CHF or fluid overload with similar findings previously. 2. Patchy opacities in the lower lung fields which could be atelectasis, consolidation or airspace edema. Electronically Signed   By: Francis Quam M.D.   On: 01/22/2024 21:03      Vernal Alstrom, MD  [facility] 01/24/2024  If 7PM-7AM, please contact night-coverage

## 2024-01-24 NOTE — Evaluation (Signed)
 Occupational Therapy Evaluation Patient Details Name: Elizabeth [name] MRN: [id number] DOB: [birth year] Today's Date: 01/24/2024   History of Present Illness   Pt is an 83 y/o female admitted 01/23/24 from [facility] due to AMS not acting like herself generalized fatigue, dx with hypercapneic resp failure. xr suggests volume overload. PMH includes heart failure, Arthritis, Asthma, Bronchitis, Dementia, Hypertension associated with diabetes, Type 2 diabetes mellitus, chronic debilitation (WC at baseline), Chronic hypoxic resp failure (3L at baseline).     Clinical Impressions Pt is from [facility], where she is WC bound for mobility and was either +2 assist for transfer or mechanical lift. She gets assist for LB bathing/dressing and does her own self-feeding and grooming as well as assisting caregivers with UB ADL. Today she was received by OT on the bari [facility] and despite x2 attempts for standing with max A +2 assist required mechanical lift to recliner. Pt's legs had gone numb from [facility]. At this time OT will follow acutely and recommending return to rehab of <3 hours daily to maximize safety and independence in ADL and functional transfers. Next session plan on OOB transfers and seated balance for seated ADL like she will perform in [facility]     If plan is discharge home, recommend the following:   Two people to help with walking and/or transfers;A lot of help with bathing/dressing/bathroom     Functional Status Assessment   Patient has had a recent decline in their functional status and demonstrates the ability to make significant improvements in function in a reasonable and predictable amount of time.     Equipment Recommendations   None recommended by OT     Recommendations for Other Services   PT consult     Precautions/Restrictions   Precautions Precautions: Fall Recall of Precautions/Restrictions: Impaired (baseline dementia) Restrictions Weight Bearing  Restrictions Per Provider Order: No     Mobility Bed Mobility               General bed mobility comments: recieved on the [facility]    Transfers Overall transfer level: Needs assistance Equipment used: Ambulation equipment used               General transfer comment: unable to stand from [facility] due despite multiople attempts, used maxi sky to transfer from [facility] to recliner Transfer via Lift Equipment: Maxisky    Balance Overall balance assessment: Needs assistance   Sitting balance-Leahy Scale: Good     Standing balance support: Bilateral upper extremity supported Standing balance-Leahy Scale: Zero Standing balance comment: UE support and A of 2 for standing                           ADL either performed or assessed with clinical judgement   ADL Overall ADL's : Needs assistance/impaired Eating/Feeding: Set up;Sitting   Grooming: Wash/dry face;Wash/dry hands;Set up;Sitting   Upper Body Bathing: Moderate assistance   Lower Body Bathing: Maximal assistance;Bed level   Upper Body Dressing : Minimal assistance;Sitting   Lower Body Dressing: Maximal assistance;Total assistance;Bed level   Toilet Transfer: Maximal assistance;+2 for physical assistance;+2 for safety/equipment;Total assistance;Requires wide/bariatric Toilet Transfer Details (indicate cue type and reason): off BSC via maxisky Toileting- Clothing Manipulation and Hygiene: Maximal assistance;Bed level;+2 for safety/equipment;+2 for physical assistance       Functional mobility during ADLs: Maximal assistance;+2 for physical assistance;+2 for safety/equipment (RN staff to use lift equipment) General ADL Comments: suspect close to baseline for ADL  Vision Ability to See in Adequate Light: 0 Adequate Patient Visual Report: No change from baseline       Perception         Praxis         Pertinent Vitals/Pain Pain Assessment Pain Assessment: Faces Faces Pain Scale: Hurts little  more Pain Location: legs numb after on BSC extended time Pain Descriptors / Indicators: Numbness Pain Intervention(s): Monitored during session, Repositioned     Extremity/Trunk Assessment Upper Extremity Assessment Upper Extremity Assessment: Generalized weakness (can lift against gravity but not hold)   Lower Extremity Assessment Lower Extremity Assessment: Defer to PT evaluation   Cervical / Trunk Assessment Cervical / Trunk Assessment: Kyphotic;Other exceptions Cervical / Trunk Exceptions: increased body habitus   Communication Communication Communication: No apparent difficulties Factors Affecting Communication: Difficulty expressing self (slow to respond at times)   Cognition Arousal: Alert Behavior During Therapy: WFL for tasks assessed/performed Cognition: History of cognitive impairments                               Following commands: Intact       Cueing  General Comments   Cueing Techniques: Verbal cues;Tactile cues      Exercises     Shoulder Instructions      Home Living Family/patient expects to be discharged to:: Skilled nursing facility                                 Additional Comments: [facility]      Prior Functioning/Environment Prior Level of Function : Needs assist             Mobility Comments: using lift or 2 assist to wheelchair ADLs Comments: can feed herself and help with upper body bathing/dressing    OT Problem List: Decreased strength;Decreased activity tolerance;Impaired balance (sitting and/or standing);Obesity   OT Treatment/Interventions: Self-care/ADL training;Therapeutic exercise;Manual therapy;Therapeutic activities;Patient/family education;Balance training      OT Goals(Current goals can be found in the care plan section)   Acute Rehab OT Goals Patient Stated Goal: watch some TV OT Goal Formulation: With patient Time For Goal Achievement: 02/07/24 Potential to Achieve Goals:  Good ADL Goals Pt Will Perform Grooming: with modified independence;sitting Pt Will Perform Upper Body Bathing: with supervision;with adaptive equipment;sitting Pt Will Perform Upper Body Dressing: with set-up;sitting Pt Will Transfer to Toilet: with mod assist;with +2 assist;stand pivot transfer Pt Will Perform Toileting - Clothing Manipulation and hygiene: with max assist;sit to/from stand Additional ADL Goal #1: Pt will maintain seated balance EOB for 5-7 min for participation in ADL   OT Frequency:  Min 2X/week    Co-evaluation PT/OT/SLP Co-Evaluation/Treatment: Yes Reason for Co-Treatment: For patient/therapist safety;To address functional/ADL transfers PT goals addressed during session: Mobility/safety with mobility;Balance;Proper use of DME;Strengthening/ROM OT goals addressed during session: ADL's and self-care;Strengthening/ROM      AM-PAC OT 6 Clicks Daily Activity     Outcome Measure Help from another person eating meals?: None Help from another person taking care of personal grooming?: A Little Help from another person toileting, which includes using toliet, bedpan, or urinal?: A Lot Help from another person bathing (including washing, rinsing, drying)?: A Lot Help from another person to put on and taking off regular upper body clothing?: A Little Help from another person to put on and taking off regular lower body clothing?: A Lot 6 Click Score: 16  End of Session Equipment Utilized During Treatment: Gait belt;Oxygen  (3L; maxisky) Nurse Communication: Mobility status;Need for lift equipment;Precautions  Activity Tolerance: Patient tolerated treatment well Patient left: in chair;with call bell/phone within reach;with chair alarm set  OT Visit Diagnosis: Other abnormalities of gait and mobility (R26.89);Muscle weakness (generalized) (M62.81)                Time: 8769-8744 OT Time Calculation (min): 25 min Charges:  OT General Charges $OT Visit: 1 Visit OT  Evaluation $OT Eval Moderate Complexity: 1 Mod  Leita DEL OTR/L Acute Rehabilitation Services Office: [phone]   Leita JINNY Odea 01/24/2024, 2:16 PM

## 2024-01-24 NOTE — Plan of Care (Signed)
  Problem: Coping: Goal: Ability to adjust to condition or change in health will improve Outcome: Progressing   Problem: Fluid Volume: Goal: Ability to maintain a balanced intake and output will improve Outcome: Progressing   Problem: Health Behavior/Discharge Planning: Goal: Ability to manage health-related needs will improve Outcome: Progressing   Problem: Metabolic: Goal: Ability to maintain appropriate glucose levels will improve Outcome: Progressing

## 2024-01-24 NOTE — Progress Notes (Signed)
   NAME:  Elizabeth [name], MRN:  [id number], DOB:  11-03-1940, LOS: 1 ADMISSION DATE:  01/22/2024, CONSULTATION DATE:  01/22/24 REFERRING MD:  EDP, CHIEF COMPLAINT:  acute metabolic encephalopathy   History of Present Illness:  83 yo female presented with confusion  from SNF that was relatively progressive in onset, starting over past few days. Found to have CO of >100 started on NIV. When I assessed pt however, NIV mask was partially in place and continued to require readjustment during my eval. Larger mask requested to RN and EDP to ensure fit. Pt does arouse but drifts back to sleep quickly, is maintaining airway and follows commands when awake.   Pt does endorse she has felt more sleepy and not herself the past few days, denies any other associated symptoms. No n/v/d no f/c no headache/cp/dizziness   Pertinent  Medical History  Htn Hyperlipidemia T2dm well controlled Dementia Asthma Arthritis GERD Chronic debilitation, in wheelchair at baseline Recurrent UTI Chronic hypoxic resp failure (3L at baseline)  Significant Hospital Events: Including procedures, antibiotic start and stop dates in addition to other pertinent events   10/2 admitted to ICU  for acute hypercapneic resp failure 10/3 doing well off BiPAP  Interim History / Subjective:   Breathing okay Was able to use BiPAP last night On 1 L nasal cannula saturation 98% obese woman's  Objective    Blood pressure (!) 177/68, pulse (!) 38, temperature 98.7 F (37.1 C), temperature source Axillary, resp. rate (!) 21, height 5' 3 (1.6 m), weight 108.9 kg, SpO2 98%.    FiO2 (%):  [40 %] 40 %   Intake/Output Summary (Last 24 hours) at 01/24/2024 1123 Last data filed at 01/24/2024 1000 Gross per 24 hour  Intake 1520.26 ml  Output 3200 ml  Net -1679.74 ml   Filed Weights   01/23/24 0415  Weight: 108.9 kg    Examination: General: Elderly obese woman sitting up in bed no distress HENT: ncat eomi, perrla mmdry but  pink Lungs: diminished bilaterally, no accessory muscle use Cardiovascular: rrr Abdomen: soft nt, nd bs+ Extremities: no c/c/e Neuro: Alert, interactive, nonfocal  Labs show decreased hyperkalemia 5.0, hypophosphatemia 1.8, bicarbonate 34, no leukocytosis, stable anemia  Resolved problem list   Assessment and Plan  Acute on chronic hypercapneic resp failure Chronic hypoxic resp failure (on 3L baseline) Decompensated OSA on CPAP: Reportedly not wearing CPAP at SNF, Previous consultations by sleep providers reviewed. Auto CPAP download has shown control of events on CPAP 9 to 13 cm. Nocturnal oximetry has showed mild desaturation less than 10 minutes.   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 - Resolved  Hyperammonemia - ensure clearing - Defer lactulose as mental status has improved   PCCM will be available as needed  Harden Staff MD. FCCP. [facility] Pager : [phone]  If no response to pager , please call [phone] until 7 pm After 7:00 pm call Elink  [phone]     01/24/2024 11:23 AM

## 2024-01-25 ENCOUNTER — Encounter (HOSPITAL_COMMUNITY): Payer: Self-pay | Admitting: Critical Care Medicine

## 2024-01-25 DIAGNOSIS — J9602 Acute respiratory failure with hypercapnia: Secondary | ICD-10-CM | POA: Diagnosis not present

## 2024-01-25 LAB — CBC
HCT: 27.3 % — ABNORMAL LOW (ref 36.0–46.0)
Hemoglobin: 8.2 g/dL — ABNORMAL LOW (ref 12.0–15.0)
MCH: 29.6 pg (ref 26.0–34.0)
MCHC: 30 g/dL (ref 30.0–36.0)
MCV: 98.6 fL (ref 80.0–100.0)
Platelets: 171 K/uL (ref 150–400)
RBC: 2.77 MIL/uL — ABNORMAL LOW (ref 3.87–5.11)
RDW: 15 % (ref 11.5–15.5)
WBC: 5 K/uL (ref 4.0–10.5)
nRBC: 0 % (ref 0.0–0.2)

## 2024-01-25 LAB — GLUCOSE, CAPILLARY
Glucose-Capillary: 147 mg/dL — ABNORMAL HIGH (ref 70–99)
Glucose-Capillary: 160 mg/dL — ABNORMAL HIGH (ref 70–99)
Glucose-Capillary: 80 mg/dL (ref 70–99)
Glucose-Capillary: 85 mg/dL (ref 70–99)
Glucose-Capillary: 90 mg/dL (ref 70–99)
Glucose-Capillary: 99 mg/dL (ref 70–99)

## 2024-01-25 LAB — MAGNESIUM: Magnesium: 1.7 mg/dL (ref 1.7–2.4)

## 2024-01-25 LAB — BASIC METABOLIC PANEL WITH GFR
Anion gap: 8 (ref 5–15)
BUN: 16 mg/dL (ref 8–23)
CO2: 33 mmol/L — ABNORMAL HIGH (ref 22–32)
Calcium: 9.8 mg/dL (ref 8.9–10.3)
Chloride: 98 mmol/L (ref 98–111)
Creatinine, Ser: 0.55 mg/dL (ref 0.44–1.00)
GFR, Estimated: >60 mL/min (ref >60)
Glucose, Bld: 88 mg/dL (ref 70–99)
Potassium: 4.2 mmol/L (ref 3.5–5.1)
Sodium: 140 mmol/L (ref 135–145)

## 2024-01-25 MED ORDER — ACETAMINOPHEN 325 MG PO TABS
650.0000 mg | ORAL_TABLET | Freq: Four times a day (QID) | ORAL | Status: DC | PRN
  Administered 2024-01-25: 650 mg via ORAL
  Filled 2024-01-25: qty 2

## 2024-01-25 NOTE — Progress Notes (Signed)
   01/25/24 0436  BiPAP/CPAP/SIPAP  BiPAP/CPAP/SIPAP Pt Type Adult  BiPAP/CPAP/SIPAP V60  Mask Type Full face mask  Dentures removed? Not applicable  Mask Size Large  Set Rate 14 breaths/min  Respiratory Rate 22 breaths/min  IPAP 18 cmH20  EPAP 6 cmH2O  FiO2 (%) 40 %  Flow Rate 0 lpm  Minute Ventilation 10.6  Leak 15  Peak Inspiratory Pressure (PIP) 19  Tidal Volume (Vt) 622  Patient Home Machine No  Patient Home Mask No  Patient Home Tubing No  Auto Titrate No  Press High Alarm 30 cmH2O  Press Low Alarm 5 cmH2O  BiPAP/CPAP /SiPAP Vitals  Pulse Rate (!) 48  Resp 20  SpO2 100 %  MEWS Score/Color  MEWS Score 1  MEWS Score Color Green

## 2024-01-25 NOTE — Progress Notes (Signed)
 PROGRESS NOTE  Elizabeth [name] FMW:[id number] DOB: 1940/11/09 DOA: 01/22/2024 PCP: Albina GORMAN Dine, MD   LOS: 2 days   Brief narrative:  83 yo female with past medical history of congestive heart failure, diabetes mellitus, chronic hypoxic respiratory failure on 3 L of oxygen  at baseline, obstructive sleep apnea presented to hospital from skilled nursing facility with progressive confusion.  In the ED patient was noted to have CO2 level more than 100 and was put on NIV.  Pulmonary was consulted and patient presented to hospital for further evaluation and treatment.  Assessment/Plan: Principal Problem:   Acute hypercapnic respiratory failure (HCC) Active Problems:   Obesity hypoventilation syndrome (HCC)  Acute hypercapneic resp failure on Chronic hypoxic resp failure (on 3L baseline) Decompensated OSA on CPAP: Patient has not been wearing CPAP at the skilled nursing facility.  Was on BiPAP initially on presentation the hospital.  Pulmonary on board and recommend auto BiPAP on discharge with plans for  EPAP 15 IPAP of 20 with pressure support of 4.  Continue DuoNebs and bronchodilators.  Completed course of Rocephin  and Zithromax.  2D echocardiogram shows right ventricular systolic pressure of 62 consistent with severe pulmonary hypertension.  Plan is oxygen  and BiPAP on discharge.   Metabolic encephalopathy: Secondary to CO2 narcosis.  Has improved.  Continue CPAP at night  Paroxysmal atrial fibrillation.  Rate controlled.  On Eliquis .  Will continue to monitor.  Asymptomatic   Acute on chronic HFpEF 2D echo with LV ejection fraction of 60 to 65%.  Continue to monitor.   Hyperkalemia Improved.  Latest potassium of 4.2.  Hypophosphatemia.  Replenished with sodium phosphate.  Check phosphorus level in AM.  Diabetes mellitus type 2. Continue diabetic diet.,  Sliding scale insulin .   Hyperammonemia Mentation has improved at this time.  Defer lactulose.  Debility deconditioning.   Patient is from skilled nursing facility.  Will need skilled nursing facility placement on discharge  Pressure injury present on admission. Continue wound care. Wound 12/05/23 1800 Pressure Injury Buttocks Left Stage 2 -  Partial thickness loss of dermis presenting as a shallow open injury with a red, pink wound bed without slough. (Active)     Wound 12/05/23 1800 Pressure Injury Buttocks Right Stage 2 -  Partial thickness loss of dermis presenting as a shallow open injury with a red, pink wound bed without slough. (Active)     Wound 01/23/24 0210 Pressure Injury Buttocks Right;Left;Mid Stage 2 -  Partial thickness loss of dermis presenting as a shallow open injury with a red, pink wound bed without slough. (Active)     DVT prophylaxis: SCDs Start: 01/23/24 0051 apixaban  (ELIQUIS ) tablet 5 mg   Disposition: Skilled nursing facility likely in 1 to 2 days  Status is: Inpatient Remains inpatient appropriate because: Pending clinical improvement    Code Status:     Code Status: Limited: Do not attempt resuscitation (DNR) -DNR-LIMITED -Do Not Intubate/DNI   Family Communication: None at bedside  Consultants: PCCM  Procedures: BiPAP  Anti-infectives:  None currently   Subjective: Today, patient was seen and examined at bedside.  Patient states that she feels okay.  Denies any pain, nausea, vomiting.  She feels like she might be constipated.  Denies any dyspnea or chest pain.  Nursing staff reported that she had some A-fib rhythm which is controlled.  Objective: Vitals:   01/25/24 0905 01/25/24 1120  BP:    Pulse: 62   Resp: 15   Temp:  98.2 F (36.8 C)  SpO2: 100%  Intake/Output Summary (Last 24 hours) at 01/25/2024 1348 Last data filed at 01/25/2024 1200 Gross per 24 hour  Intake 607.55 ml  Output 3550 ml  Net -2942.45 ml   Filed Weights   01/23/24 0415  Weight: 108.9 kg   Body mass index is 42.51 kg/m.   Physical Exam: GENERAL: Patient is alert awake and  oriented. Not in obvious distress.  Obese build.  Nasal cannula oxygen . HENT: No scleral pallor or icterus. Pupils equally reactive to light. Oral mucosa is moist NECK: is supple, no gross swelling noted. CHEST: Decreased breath sounds bilaterally. CVS: S1 and S2 heard, no murmur.  ABDOMEN: Soft, non-tender, bowel sounds are present. EXTREMITIES: No edema. CNS: Cranial nerves are intact.  Moves all extremities SKIN: warm and dry without rashes.  Data Review: I have personally reviewed the following laboratory data and studies,  CBC: Recent Labs  Lab 01/22/24 2040 01/23/24 0349 01/24/24 0249 01/25/24 0351  WBC 4.7 3.6* 6.9 5.0  NEUTROABS 3.6  --   --   --   HGB 9.2* 8.1* 7.5* 8.2*  HCT 34.5* 29.7* 24.8* 27.3*  MCV 109.9* 107.2* 99.6 98.6  PLT 186 133* 172 171   Basic Metabolic Panel: Recent Labs  Lab 01/22/24 2040 01/22/24 2334 01/23/24 0349 01/23/24 1618 01/24/24 0249 01/25/24 0351  NA 135 135 139  --  136 140  K 6.7* 6.1* 5.7* 5.3* 5.0 4.2  CL 96* 96* 97*  --  96* 98  CO2 30 28 33*  --  34* 33*  GLUCOSE 119* 132* 115*  --  102* 88  BUN 26* 27* 28*  --  24* 16  CREATININE 1.26* 1.18* 1.09*  --  0.72 0.55  CALCIUM  10.0 14.3* 10.0  --  9.5 9.8  MG 2.3  --   --   --  2.0 1.7  PHOS  --   --   --   --  1.8*  --    Liver Function Tests: Recent Labs  Lab 01/22/24 2040 01/23/24 0349 01/24/24 0249  AST 16 11* 11*  ALT 7 6 <5  ALKPHOS 59 51 46  BILITOT 0.3 0.2 <0.2  PROT 7.0 5.8* 5.7*  ALBUMIN 3.8 3.3* 3.3*   No results for input(s): LIPASE, AMYLASE in the last 168 hours. Recent Labs  Lab 01/22/24 2107 01/24/24 0249  AMMONIA 85* 37*   Cardiac Enzymes: No results for input(s): CKTOTAL, CKMB, CKMBINDEX, TROPONINI in the last 168 hours. BNP (last 3 results) Recent Labs    09/15/23 0331 09/18/23 1155 12/05/23 0925  BNP 313.2* 221.2* 391.2*    ProBNP (last 3 results) Recent Labs    01/22/24 2040  PROBNP 3,135.0*    CBG: Recent Labs   Lab 01/24/24 2011 01/24/24 2359 01/25/24 0351 01/25/24 0743 01/25/24 1127  GLUCAP 92 90 80 85 99   Recent Results (from the past 240 hours)  MRSA Next Gen by PCR, Nasal     Status: Abnormal   Collection Time: 01/23/24  2:34 AM   Specimen: Nasal Mucosa; Nasal Swab  Result Value Ref Range Status   MRSA by PCR Next Gen DETECTED (A) NOT DETECTED Final    Comment: CRITICAL RESULT CALLED TO, READ BACK BY AND VERIFIED WITH: KARAMGWA P. [id number] 01/23/24 MCLEAN K. (NOTE) The GeneXpert MRSA Assay (FDA approved for NASAL specimens only), is one component of a comprehensive MRSA colonization surveillance program. It is not intended to diagnose MRSA infection nor to guide or monitor treatment for MRSA infections. Test performance is not  FDA approved in patients less than 61 years old. Performed at [facility], [address] [place], [place] [address]      Studies: ECHOCARDIOGRAM COMPLETE Result Date: 01/23/2024    ECHOCARDIOGRAM REPORT   Patient Name:   Elizabeth [name] Date of Exam: 01/23/2024 Medical Rec #:  [id number]       Height:       63.0 in Accession #:    [id number]      Weight:       240.0 lb Date of Birth:  July 01, 1940        BSA:          2.089 m Patient Age:    83 years        BP:           124/37 mmHg Patient Gender: F               HR:           66 bpm. Exam Location:  Inpatient Procedure: 2D Echo, Cardiac Doppler and Color Doppler (Both Spectral and Color            Flow Doppler were utilized during procedure). Indications:    CHF I50.9  History:        Patient has prior history of Echocardiogram examinations, most                 recent 09/15/2023. CHF; Risk Factors:Hypertension. H/O Dementia,                 Hyperlipidemia, Chronic hypoxic respiratory failure.  Sonographer:    BERNARDA ROCKS Referring Phys: [id number] JESSICA MARSHALL IMPRESSIONS  1. Left ventricular ejection fraction, by estimation, is 60 to 65%. The left ventricle has normal function. The left ventricle  has no regional wall motion abnormalities. Left ventricular diastolic parameters were normal.  2. Right ventricular systolic function is low normal. The right ventricular size is mildly enlarged. There is severely elevated pulmonary artery systolic pressure.  3. Left atrial size was severely dilated.  4. Right atrial size was moderately dilated.  5. Mild mitral valve regurgitation.  6. AV is thickened, calcified with minimally restricted motion. . The aortic valve is tricuspid. Aortic valve regurgitation is trivial.  7. The inferior vena cava is dilated in size with <50% respiratory variability, suggesting right atrial pressure of 15 mmHg. Comparison(s): The left ventricular function is unchanged. FINDINGS  Left Ventricle: Left ventricular ejection fraction, by estimation, is 60 to 65%. The left ventricle has normal function. The left ventricle has no regional wall motion abnormalities. The left ventricular internal cavity size was normal in size. There is  no left ventricular hypertrophy. Left ventricular diastolic parameters were normal. Right Ventricle: The right ventricular size is mildly enlarged. Right vetricular wall thickness was not assessed. Right ventricular systolic function is low normal. There is severely elevated pulmonary artery systolic pressure. The tricuspid regurgitant velocity is 3.44 m/s, and with an assumed right atrial pressure of 15 mmHg, the estimated right ventricular systolic pressure is 62.3 mmHg. Left Atrium: Left atrial size was severely dilated. Right Atrium: Right atrial size was moderately dilated. Pericardium: There is no evidence of pericardial effusion. Mitral Valve: There is mild thickening of the mitral valve leaflet(s). Mild to moderate mitral annular calcification. Mild mitral valve regurgitation. MV peak gradient, 10.8 mmHg. The mean mitral valve gradient is 4.0 mmHg. Tricuspid Valve: The tricuspid valve is normal in structure. Tricuspid valve regurgitation is mild. Aortic  Valve: AV is  thickened, calcified with minimally restricted motion. The aortic valve is tricuspid. Aortic valve regurgitation is trivial. Aortic valve mean gradient measures 10.3 mmHg. Aortic valve peak gradient measures 22.9 mmHg. Aortic valve area, by VTI measures 2.75 cm. Pulmonic Valve: The pulmonic valve was grossly normal. Pulmonic valve regurgitation is trivial. No evidence of pulmonic stenosis. Aorta: The aortic root and ascending aorta are structurally normal, with no evidence of dilitation. Venous: The inferior vena cava is dilated in size with less than 50% respiratory variability, suggesting right atrial pressure of 15 mmHg. IAS/Shunts: No atrial level shunt detected by color flow Doppler.  LEFT VENTRICLE PLAX 2D LVIDd:         5.40 cm      Diastology LVIDs:         3.60 cm      LV e' medial:    9.14 cm/s LV PW:         1.10 cm      LV E/e' medial:  16.8 LV IVS:        1.00 cm      LV e' lateral:   11.70 cm/s LVOT diam:     2.40 cm      LV E/e' lateral: 13.2 LV SV:         145 LV SV Index:   70 LVOT Area:     4.52 cm  LV Volumes (MOD) LV vol d, MOD A2C: 197.0 ml LV vol d, MOD A4C: 184.0 ml LV vol s, MOD A2C: 80.1 ml LV vol s, MOD A4C: 71.9 ml LV SV MOD A2C:     116.9 ml LV SV MOD A4C:     184.0 ml LV SV MOD BP:      122.5 ml RIGHT VENTRICLE             IVC RV Basal diam:  5.40 cm     IVC diam: 3.10 cm RV S prime:     12.40 cm/s TAPSE (M-mode): 1.9 cm RVSP:           62.3 mmHg LEFT ATRIUM              Index        RIGHT ATRIUM            Index LA diam:        3.40 cm  1.63 cm/m   RA Pressure: 15.00 mmHg LA Vol (A2C):   107.0 ml 51.21 ml/m  RA Area:     22.80 cm LA Vol (A4C):   132.0 ml 63.18 ml/m  RA Volume:   79.40 ml   38.00 ml/m LA Biplane Vol: 124.0 ml 59.35 ml/m  AORTIC VALVE                     PULMONIC VALVE AV Area (Vmax):    2.74 cm      PV Vmax:          1.18 m/s AV Area (Vmean):   2.77 cm      PV Peak grad:     5.6 mmHg AV Area (VTI):     2.75 cm      PR End Diast Vel: 1.29 msec AV  Vmax:           239.33 cm/s AV Vmean:          151.000 cm/s AV VTI:            0.529 m AV Peak Grad:      22.9  mmHg AV Mean Grad:      10.3 mmHg LVOT Vmax:         145.00 cm/s LVOT Vmean:        92.600 cm/s LVOT VTI:          0.321 m LVOT/AV VTI ratio: 0.61  AORTA Ao Root diam: 3.30 cm Ao Asc diam:  3.80 cm MITRAL VALVE                TRICUSPID VALVE MV Area (PHT): 3.87 cm     TR Peak grad:   47.3 mmHg MV Area VTI:   2.68 cm     TR Vmax:        344.00 cm/s MV Peak grad:  10.8 mmHg    Estimated RAP:  15.00 mmHg MV Mean grad:  4.0 mmHg     RVSP:           62.3 mmHg MV Vmax:       1.64 m/s MV Vmean:      92.8 cm/s    SHUNTS MV Decel Time: 196 msec     Systemic VTI:  0.32 m MR Peak grad: 90.6 mmHg     Systemic Diam: 2.40 cm MR Vmax:      476.00 cm/s MV E velocity: 154.00 cm/s MV A velocity: 137.00 cm/s MV E/A ratio:  1.12 Vina Gull MD Electronically signed by Vina Gull MD Signature Date/Time: 01/23/2024/5:34:49 PM    Final       Vernal Alstrom, MD  [facility] 01/25/2024  If 7PM-7AM, please contact night-coverage

## 2024-01-25 NOTE — Plan of Care (Signed)
 Discussed with patient plan of care for the evening, pain management and medications for bed time with some teach back displayed.  Did mouth care with HHNtx and inhaler.     Problem: Education: Goal: Ability to describe self-care measures that may prevent or decrease complications (Diabetes Survival Skills Education) will improve Outcome: Progressing   Problem: Coping: Goal: Ability to adjust to condition or change in health will improve Outcome: Progressing   Problem: Elimination: Goal: Will not experience complications related to bowel motility Outcome: Progressing   Problem: Pain Managment: Goal: General experience of comfort will improve and/or be controlled Outcome: Progressing

## 2024-01-25 NOTE — Plan of Care (Signed)
  Problem: Education: Goal: Ability to describe self-care measures that may prevent or decrease complications (Diabetes Survival Skills Education) will improve Outcome: Progressing   Problem: Coping: Goal: Ability to adjust to condition or change in health will improve Outcome: Progressing   Problem: Metabolic: Goal: Ability to maintain appropriate glucose levels will improve Outcome: Progressing   Problem: Nutritional: Goal: Maintenance of adequate nutrition will improve Outcome: Progressing Goal: Progress toward achieving an optimal weight will improve Outcome: Progressing

## 2024-01-25 NOTE — TOC Initial Note (Signed)
 Transition of Care [facility]) - Initial/Assessment Note    Patient Details  Name: Elizabeth [name] MRN: [id number] Date of Birth: 1941/04/07  [facility] CM/SW Contact:    Jon ONEIDA Anon, RN Phone Number: 01/25/2024, 4:11 PM  Clinical Narrative:                 Pt admitted from [facility]. Pt is a LTC resident at the facility. PT/OT recommend SNF at DC. NCM spoke with pt daughter and she states plan is for pt to return to [facility] once medically stable for DC. NCM spoke with Tammy about pt needing Bipap once she returns to facility and she confirms pt will have needed equipment. ICM continuing to follow.    Expected Discharge Plan: Skilled Nursing Facility Barriers to Discharge: Continued Medical Work up   Patient Goals and CMS Choice Patient states their goals for this hospitalization and ongoing recovery are:: Return to SNF CMS Medicare.gov Compare Post Acute Care list provided to:: Patient Choice offered to / list presented to : Patient [place] ownership interest in [facility].provided to:: Patient    Expected Discharge Plan and Services In-house Referral: NA Discharge Planning Services: CM Consult Post Acute Care Choice: Skilled Nursing Facility Living arrangements for the past 2 months: Skilled Nursing Facility                 DME Arranged: N/A DME Agency: NA       HH Arranged: NA HH Agency: NA        Prior Living Arrangements/Services Living arrangements for the past 2 months: Skilled Nursing Facility Lives with:: Facility Resident Patient language and need for interpreter reviewed:: Yes Do you feel safe going back to the place where you live?: Yes      Need for Family Participation in Patient Care: Yes (Comment) Care giver support system in place?: Yes (comment)   Criminal Activity/Legal Involvement Pertinent to Current Situation/Hospitalization: No - Comment as needed  Activities of Daily Living   ADL Screening  (condition at time of admission) Is the patient deaf or have difficulty hearing?: No Does the patient have difficulty seeing, even when wearing glasses/contacts?: No Does the patient have difficulty concentrating, remembering, or making decisions?: Yes  Permission Sought/Granted Permission sought to share information with : Family Supports, Magazine features editor Permission granted to share information with : Yes, Verbal Permission Granted  Share Information with NAME: Barman(POA), [name]  [phone]           Emotional Assessment Appearance:: Appears stated age Attitude/Demeanor/Rapport: Unable to Assess Affect (typically observed): Unable to Assess   Alcohol  / Substance Use: Not Applicable Psych Involvement: No (comment)  Admission diagnosis:  Hyperkalemia [E87.5] Respiratory acidosis [E87.29] Acute hypercapnic respiratory failure (HCC) [J96.02] Patient Active Problem List   Diagnosis Date Noted   Acute hypercapnic respiratory failure (HCC) 01/23/2024   Obesity hypoventilation syndrome (HCC) 01/23/2024   Acute respiratory failure with hypoxia and hypercapnia (HCC) 12/05/2023   Pressure injury of skin 12/05/2023   SIRS (systemic inflammatory response syndrome) (HCC) 12/05/2023   Renal insufficiency 12/05/2023   Macrocytic anemia 12/05/2023   Paroxysmal atrial fibrillation (HCC) 12/05/2023   Memory impairment 12/05/2023   Acute respiratory failure with hypercapnia (HCC) 09/17/2023   Hypomagnesemia 09/15/2023   Hypophosphatemia 09/15/2023   Acute metabolic encephalopathy 09/14/2023   Reactive airway disease with acute exacerbation 09/14/2023   Acute on chronic diastolic CHF (congestive heart failure) (HCC) 09/14/2023   Colitis 04/24/2023   Sepsis (  HCC) 04/23/2023   (HFpEF) heart failure with preserved ejection fraction (HCC) 12/24/2022   Morbid obesity (HCC) 12/24/2022   OSA (obstructive sleep apnea) 03/28/2022   Diabetes mellitus without  complication (HCC) 06/19/2021   Acute kidney injury 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 05/12/2021   COVID-19 virus infection 05/12/2021   Generalized weakness 05/12/2021   Type 2 diabetes mellitus without complication, without long-term current use of insulin  (HCC) 05/12/2021   Essential hypertension 05/12/2021   Asthma 05/12/2021   GERD (gastroesophageal reflux disease)    Leukopenia    Chronic venous insufficiency 01/17/2017   Pain of left breast 01/17/2012   PCP:  Albina GORMAN Dine, MD Pharmacy:  No Pharmacies Listed    Social Drivers of Health (SDOH) Social History: SDOH Screenings   Food Insecurity: Patient Unable To Answer (01/23/2024)  Housing: Unknown (01/23/2024)  Transportation Needs: Patient Declined (12/06/2023)  Utilities: Patient Declined (12/06/2023)  Social Connections: Patient Declined (12/06/2023)  Tobacco Use: Medium Risk (12/05/2023)   SDOH Interventions:     Readmission Risk Interventions    01/25/2024    3:44 PM 04/28/2023   11:56 AM  Readmission Risk Prevention Plan  Transportation Screening Complete Complete  PCP or Specialist Appt within 5-7 Days  Complete  PCP or Specialist Appt within 3-5 Days Complete   Home Care Screening  Complete  Medication Review (RN CM)  Complete  HRI or Home Care Consult Complete   Social Work Consult for Recovery Care Planning/Counseling Complete   Palliative Care Screening Not Applicable   Medication Review Oceanographer) Complete

## 2024-01-25 NOTE — [place] FL2 (Signed)
 [place]  MEDICAID FL2 LEVEL OF CARE FORM     IDENTIFICATION  Patient Name: Elizabeth [name] Birthdate: Nov 09, 1940 Sex: female Admission Date (Current Location): 01/22/2024  [facility] and [place] Number:  Producer, television/film/video and Address:  [facility],  [address] [place], [place] [address]      Provider Number: [id number]  Attending Physician Name and Address:  Sonjia Held, MD  Relative Name and Phone Number:  Dor(POA),[name]  [phone]    Current Level of Care: Hospital Recommended Level of Care: Skilled Nursing Facility Prior Approval Number:    Date Approved/Denied:   PASRR Number: [id number]  Discharge Plan: SNF    Current Diagnoses: Patient Active Problem List   Diagnosis Date Noted   Acute hypercapnic respiratory failure (HCC) 01/23/2024   Obesity hypoventilation syndrome (HCC) 01/23/2024   Acute respiratory failure with hypoxia and hypercapnia (HCC) 12/05/2023   Pressure injury of skin 12/05/2023   SIRS (systemic inflammatory response syndrome) (HCC) 12/05/2023   Renal insufficiency 12/05/2023   Macrocytic anemia 12/05/2023   Paroxysmal atrial fibrillation (HCC) 12/05/2023   Memory impairment 12/05/2023   Acute respiratory failure with hypercapnia (HCC) 09/17/2023   Hypomagnesemia 09/15/2023   Hypophosphatemia 09/15/2023   Acute metabolic encephalopathy 09/14/2023   Reactive airway disease with acute exacerbation 09/14/2023   Acute on chronic diastolic CHF (congestive heart failure) (HCC) 09/14/2023   Colitis 04/24/2023   Sepsis (HCC) 04/23/2023   (HFpEF) heart failure with preserved ejection fraction (HCC) 12/24/2022   Morbid obesity (HCC) 12/24/2022   OSA (obstructive sleep apnea) 03/28/2022   Diabetes mellitus without complication (HCC) 06/19/2021   Acute kidney injury 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 05/12/2021   COVID-19 virus infection 05/12/2021   Generalized weakness 05/12/2021   Type 2 diabetes mellitus  without complication, without long-term current use of insulin  (HCC) 05/12/2021   Essential hypertension 05/12/2021   Asthma 05/12/2021   GERD (gastroesophageal reflux disease)    Leukopenia    Chronic venous insufficiency 01/17/2017   Pain of left breast 01/17/2012    Orientation RESPIRATION BLADDER Height & Weight     Self, Time, Situation, Place  Other (Comment), O2 (2L O2, Will need Bipap) Incontinent Weight: 108.9 kg Height:  5' 3 (160 cm)  BEHAVIORAL SYMPTOMS/MOOD NEUROLOGICAL BOWEL NUTRITION STATUS      Incontinent Diet (Carb Modified)  AMBULATORY STATUS COMMUNICATION OF NEEDS Skin   Extensive Assist Verbally PU Stage and Appropriate Care, Other (Comment) (Irritant Contact Dermatitis Breast Lateral;Lower;Right)   PU Stage 2 Dressing: BID                   Personal Care Assistance Level of Assistance  Bathing, Feeding, Dressing Bathing Assistance: Maximum assistance Feeding assistance: Limited assistance Dressing Assistance: Maximum assistance     Functional Limitations Info  Sight, Hearing, Speech Sight Info: Adequate Hearing Info: Adequate Speech Info: Adequate    SPECIAL CARE FACTORS FREQUENCY  PT (By licensed PT), OT (By licensed OT)     PT Frequency: 5x/wk OT Frequency: 5x/wk            Contractures Contractures Info: Not present    Additional Factors Info  Code Status, Allergies Code Status Info: DNR Allergies Info: No Known Allergies           Current Medications (01/25/2024):  This is the current hospital active medication list Current Facility-Administered Medications  Medication Dose Route Frequency Provider Last Rate Last Admin   amLODipine  (NORVASC ) tablet 10 mg  10 mg Oral Daily Rosan Mt  W, NP   10 mg at 01/25/24 0902   apixaban  (ELIQUIS ) tablet 5 mg  5 mg Oral BID Rosan Deward ORN, NP   5 mg at 01/25/24 9097   Chlorhexidine  Gluconate Cloth 2 % PADS 6 each  6 each Topical Daily Layman Raisin, DO   6 each at 01/24/24 1113    docusate sodium  (COLACE) capsule 100 mg  100 mg Oral BID PRN Layman Raisin, DO       fluticasone  furoate-vilanterol (BREO ELLIPTA ) 200-25 MCG/ACT 1 puff  1 puff Inhalation Daily Rosan Deward ORN, NP   1 puff at 01/25/24 0905   hydrALAZINE  (APRESOLINE ) tablet 50 mg  50 mg Oral Q6H Rosan Deward ORN, NP   50 mg at 01/25/24 1136   insulin  aspart (novoLOG ) injection 0-9 Units  0-9 Units Subcutaneous Q4H Layman Raisin, DO   1 Units at 01/23/24 2131   ipratropium-albuterol  (DUONEB) 0.5-2.5 (3) MG/3ML nebulizer solution 3 mL  3 mL Nebulization Q6H PRN Layman Raisin, DO   3 mL at 01/24/24 1457   ipratropium-albuterol  (DUONEB) 0.5-2.5 (3) MG/3ML nebulizer solution 3 mL  3 mL Nebulization TID Pokhrel, Laxman, MD   3 mL at 01/25/24 1353   losartan  (COZAAR ) tablet 50 mg  50 mg Oral Daily Pokhrel, Laxman, MD   50 mg at 01/25/24 0901   mupirocin  ointment (BACTROBAN ) 2 % 1 Application  1 Application Nasal BID Layman Raisin, DO   1 Application at 01/25/24 9097   ondansetron  (ZOFRAN ) injection 4 mg  4 mg Intravenous Q6H PRN Layman Raisin, DO       Oral care mouth rinse  15 mL Mouth Rinse 4 times per day Layman Raisin, DO   15 mL at 01/25/24 1137   Oral care mouth rinse  15 mL Mouth Rinse PRN Layman Raisin, DO       pantoprazole  (PROTONIX ) EC tablet 40 mg  40 mg Oral Daily Hoffman, Paul W, NP   40 mg at 01/25/24 0901   polyethylene glycol (MIRALAX  / GLYCOLAX ) packet 17 g  17 g Oral Daily PRN Layman Raisin, DO       rosuvastatin  (CRESTOR ) tablet 5 mg  5 mg Oral QHS Rosan Deward ORN, NP   5 mg at 01/24/24 2232   sertraline  (ZOLOFT ) tablet 25 mg  25 mg Oral Daily Hoffman, Paul W, NP   25 mg at 01/25/24 0902     Discharge Medications: Please see discharge summary for a list of discharge medications.  Relevant Imaging Results:  Relevant Lab Results:   Additional Information SSN: [id number]  Jon ONEIDA Anon, RN

## 2024-01-25 NOTE — Progress Notes (Signed)
 Updated daughter per her request earlier.  Called to let her know about her mom being placed back in the bed and new nurses name and contact information.

## 2024-01-26 DIAGNOSIS — J9602 Acute respiratory failure with hypercapnia: Secondary | ICD-10-CM | POA: Diagnosis not present

## 2024-01-26 LAB — BASIC METABOLIC PANEL WITH GFR
Anion gap: 6 (ref 5–15)
BUN: 16 mg/dL (ref 8–23)
CO2: 32 mmol/L (ref 22–32)
Calcium: 9.2 mg/dL (ref 8.9–10.3)
Chloride: 100 mmol/L (ref 98–111)
Creatinine, Ser: 0.58 mg/dL (ref 0.44–1.00)
GFR, Estimated: >60 mL/min (ref >60)
Glucose, Bld: 80 mg/dL (ref 70–99)
Potassium: 3.8 mmol/L (ref 3.5–5.1)
Sodium: 138 mmol/L (ref 135–145)

## 2024-01-26 LAB — CBC
HCT: 24.2 % — ABNORMAL LOW (ref 36.0–46.0)
Hemoglobin: 7.5 g/dL — ABNORMAL LOW (ref 12.0–15.0)
MCH: 29.8 pg (ref 26.0–34.0)
MCHC: 31 g/dL (ref 30.0–36.0)
MCV: 96 fL (ref 80.0–100.0)
Platelets: 184 K/uL (ref 150–400)
RBC: 2.52 MIL/uL — ABNORMAL LOW (ref 3.87–5.11)
RDW: 15.3 % (ref 11.5–15.5)
WBC: 6.3 K/uL (ref 4.0–10.5)
nRBC: 0 % (ref 0.0–0.2)

## 2024-01-26 LAB — GLUCOSE, CAPILLARY
Glucose-Capillary: 74 mg/dL (ref 70–99)
Glucose-Capillary: 75 mg/dL (ref 70–99)
Glucose-Capillary: 85 mg/dL (ref 70–99)
Glucose-Capillary: 94 mg/dL (ref 70–99)

## 2024-01-26 LAB — MAGNESIUM: Magnesium: 1.6 mg/dL — ABNORMAL LOW (ref 1.7–2.4)

## 2024-01-26 LAB — PHOSPHORUS: Phosphorus: 3.5 mg/dL (ref 2.5–4.6)

## 2024-01-26 MED ORDER — MAGNESIUM SULFATE 2 GM/50ML IV SOLN
2.0000 g | Freq: Once | INTRAVENOUS | Status: AC
  Administered 2024-01-26: 2 g via INTRAVENOUS
  Filled 2024-01-26: qty 50

## 2024-01-26 MED ORDER — MAGNESIUM OXIDE -MG SUPPLEMENT 400 (240 MG) MG PO TABS
400.0000 mg | ORAL_TABLET | Freq: Two times a day (BID) | ORAL | Status: DC
  Administered 2024-01-26: 400 mg via ORAL
  Filled 2024-01-26: qty 1

## 2024-01-26 MED ORDER — MAGNESIUM OXIDE -MG SUPPLEMENT 400 (240 MG) MG PO TABS: 400.0000 mg | ORAL_TABLET | Freq: Two times a day (BID) | ORAL | Status: DC

## 2024-01-26 MED ORDER — DOCUSATE SODIUM 100 MG PO CAPS: 100.0000 mg | ORAL_CAPSULE | Freq: Two times a day (BID) | ORAL | Status: DC | PRN

## 2024-01-26 MED ORDER — BISACODYL 10 MG RE SUPP
10.0000 mg | Freq: Once | RECTAL | Status: AC
  Administered 2024-01-26: 10 mg via RECTAL
  Filled 2024-01-26: qty 1

## 2024-01-26 NOTE — TOC Transition Note (Signed)
 Transition of Care [facility]) - Discharge Note  Patient Details  Name: Elizabeth [name] MRN: [id number] Date of Birth: 1940-06-09  [facility] CM/SW Contact:  Duwaine GORMAN Aran, LCSW Phone Number: 01/26/2024, 12:28 PM  Clinical Narrative: Patient medically stable to return to [facility]. CSW confirmed with Tammy with [facility] that the BiPAP has been delivered to the facility. Patient will go to room 114A and the number for report is [phone]. Discharge summary, discharge orders, and SNF transfer report faxed to facility in hub. Medical necessity form done; PTAR scheduled. Discharge packet completed. CSW notified daughter of transportation being set up. RN updated. Care management signing off.  Final next level of care: Long Term Nursing Home Barriers to Discharge: Barriers Resolved  Patient Goals and CMS Choice Patient states their goals for this hospitalization and ongoing recovery are:: Return to SNF CMS Medicare.gov Compare Post Acute Care list provided to:: Patient Choice offered to / list presented to : Patient [place] ownership interest in [facility].provided to:: Patient   Discharge Placement  Patient chooses bed at: Other - please specify in the comment section below: [facility]) Patient to be transferred to facility by: PTAR Name of family member notified: Elizabeth [name] (daughter) Patient and family notified of of transfer: 01/26/24  Discharge Plan and Services Additional resources added to the After Visit Summary for   In-house Referral: NA Discharge Planning Services: CM Consult Post Acute Care Choice: Skilled Nursing Facility          DME Arranged: N/A DME Agency: NA HH Arranged: NA HH Agency: NA  Social Drivers of Health (SDOH) Interventions SDOH Screenings   Food Insecurity: Patient Unable To Answer (01/23/2024)  Housing: Unknown (01/23/2024)  Transportation Needs: Patient Declined (12/06/2023)  Utilities: Patient Declined  (12/06/2023)  Social Connections: Patient Declined (12/06/2023)  Tobacco Use: Medium Risk (01/25/2024)   Readmission Risk Interventions    01/25/2024    3:44 PM 04/28/2023   11:56 AM  Readmission Risk Prevention Plan  Transportation Screening Complete Complete  PCP or Specialist Appt within 5-7 Days  Complete  PCP or Specialist Appt within 3-5 Days Complete   Home Care Screening  Complete  Medication Review (RN CM)  Complete  HRI or Home Care Consult Complete   Social Work Consult for Recovery Care Planning/Counseling Complete   Palliative Care Screening Not Applicable   Medication Review Oceanographer) Complete

## 2024-01-26 NOTE — Progress Notes (Signed)
 No change from am assessment. IV site removed, clean dry and intact. Patients daughter, Diedre  contacted regarding transport via PTAR.

## 2024-01-26 NOTE — Progress Notes (Signed)
   01/25/24 2100  BiPAP/CPAP/SIPAP  BiPAP/CPAP/SIPAP Pt Type Adult  BiPAP/CPAP/SIPAP V60  Mask Type Full face mask  Dentures removed? Not applicable  Mask Size Large  Set Rate 14 breaths/min  Respiratory Rate 22 breaths/min  IPAP 18 cmH20  EPAP 6 cmH2O  FiO2 (%) 40 %  Minute Ventilation 9.7  Leak 6  Peak Inspiratory Pressure (PIP) 18  Tidal Volume (Vt) 448  Patient Home Machine No  Patient Home Mask No  Patient Home Tubing No  Auto Titrate No  Press High Alarm 30 cmH2O  Press Low Alarm 5 cmH2O  Device Plugged into RED Power Outlet Yes  BiPAP/CPAP /SiPAP Vitals  Pulse Rate 71  SpO2 100 %  Bilateral Breath Sounds Diminished  MEWS Score/Color  MEWS Score 0  MEWS Score Color Landy

## 2024-01-26 NOTE — Discharge Summary (Signed)
 Physician Discharge Summary  Elizabeth [name] FMW:[id number] DOB: October 28, 1940 DOA: 01/22/2024  PCP: Elizabeth GORMAN Dine, MD  Admit date: 01/22/2024 Discharge date: 01/26/2024  Admitted From: Skilled nursing facility  Discharge disposition: Skilled nursing facility   Recommendations for Outpatient Follow-Up:   Follow up with your primary care provider at the skilled nursing facility in 3 to 5 days. Check CBC, BMP, magnesium  in the next visit Patient should be encouraged to use BiPAP at nighttime.  Pulmonary recommends   EPAP 15 IPAP of 20 with pressure support of 4.    Discharge Diagnosis:   Principal Problem:   Acute hypercapnic respiratory failure (HCC) Active Problems:   Obesity hypoventilation syndrome (HCC)   Discharge Condition: Improved.  Diet recommendation:   Carbohydrate-modified.    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  In the ED patient was noted to have CO2 level more than 100 and was put on NIV.  Pulmonary was consulted and patient presented to hospital for further evaluation and treatment.    Hospital Course:   Following conditions were addressed during hospitalization as listed below,  Acute hypercapneic resp failure on Chronic hypoxic resp failure (on 3L baseline) Decompensated OSA on CPAP: Patient has not been wearing CPAP at the skilled nursing facility.  Was on BiPAP initially on presentation the hospital.  Pulmonary on board and recommend auto BiPAP on discharge with plans for  EPAP 15 IPAP of 20 with pressure support of 4.  Patient received DuoNebs and bronchodilators.  Completed course of Rocephin  and Zithromax.  2D echocardiogram shows right ventricular  systolic pressure of 62 consistent with severe pulmonary hypertension.  Plan is oxygen  and BiPAP on discharge.    Metabolic encephalopathy: Secondary to CO2 narcosis.  Has improved.  Continue BiPAP at nighttime   Paroxysmal atrial fibrillation.  Rate controlled.  On Eliquis .  Will continue to monitor.  Asymptomatic   Acute on chronic HFpEF 2D echo with LV ejection fraction of 60 to 65%.  Continue to monitor.   Hyperkalemia Improved.  Latest potassium of 3.8  Hypomagnesemia.  Will replace with 2 g of IV magnesium  sulfate and magnesium  oxide.   Hypophosphatemia.  Replenished and improved.  Latest phosphorus of 3.5.   Diabetes mellitus type 2. Continue diabetic diet.,  Sliding scale insulin .   Hyperammonemia Mentation has improved at this time.     Debility deconditioning.  Patient is from skilled nursing facility.  Will need skilled nursing facility placement on discharge   Pressure injury present on admission. Continue wound care. Wound 12/05/23 1800 Pressure Injury Buttocks Left Stage 2 -  Partial thickness loss of dermis presenting as a shallow open injury with a red, pink wound bed without slough. (Active)     Wound 12/05/23 1800 Pressure Injury Buttocks Right Stage 2 -  Partial thickness loss of dermis presenting as a shallow open injury with a red, pink wound bed without slough. (Active)     Wound 01/23/24 0210 Pressure Injury Buttocks Right;Left;Mid Stage 2 -  Partial thickness loss of dermis presenting as a shallow open injury with a red, pink wound bed without slough. (Active)    Class III obesity.  Body mass index is 44.91 kg/m. Patient would benefit from ongoing weight loss  as outpatient.  Disposition.  At this time, patient is stable for disposition back to skilled nursing facility.  Medical Consultants:   PCCM  Procedures:    BiPAP Subjective:   Today, patient was seen and examined today.  Feels okay.  Denies any pain, nausea, vomiting.  No chills.  Has been  eating okay.  Denies any shortness of breath or dyspnea.  Discharge Exam:   Vitals:   01/26/24 0516 01/26/24 0805  BP: (!) 160/75   Pulse: (!) 59   Resp:    Temp: 98 F (36.7 C)   SpO2: 100% 100%   Vitals:   01/26/24 0500 01/26/24 0514 01/26/24 0516 01/26/24 0805  BP:  (!) 160/75 (!) 160/75   Pulse:   (!) 59   Resp:      Temp:  98 F (36.7 C) 98 F (36.7 C)   TempSrc:  Oral Oral   SpO2:  100% 100% 100%  Weight: 115 kg     Height:       Body mass index is 44.91 kg/m.   General: Alert awake, not in obvious distress.  Obese built, on room air HENT: pupils equally reacting to light,  No scleral pallor or icterus noted. Oral mucosa is moist.  Chest: Decreased breath sounds bilaterally. CVS: S1 &S2 heard. No murmur.  Regular rate and rhythm. Abdomen: Soft, nontender, nondistended.  Bowel sounds are heard.   Extremities: No cyanosis, clubbing or edema.  Peripheral pulses are palpable. Psych: Alert, awake and oriented, normal mood CNS:  No cranial nerve deficits.  Moves all extremities Skin: Warm and dry.  No rashes noted.  The results of significant diagnostics from this hospitalization (including imaging, microbiology, ancillary and laboratory) are listed below for reference.     Diagnostic Studies:   ECHOCARDIOGRAM COMPLETE Result Date: 01/23/2024    ECHOCARDIOGRAM REPORT   Patient Name:   Elizabeth [name] Date of Exam: 01/23/2024 Medical Rec #:  [id number]       Height:       63.0 in Accession #:    [id number]      Weight:       240.0 lb Date of Birth:  June 17, 1940        BSA:          2.089 m Patient Age:    83 years        BP:           124/37 mmHg Patient Gender: F               HR:           66 bpm. Exam Location:  Inpatient Procedure: 2D Echo, Cardiac Doppler and Color Doppler (Both Spectral and Color            Flow Doppler were utilized during procedure). Indications:    CHF I50.9  History:        Patient has prior history of Echocardiogram examinations, most                  recent 09/15/2023. CHF; Risk Factors:Hypertension. H/O Dementia,                 Hyperlipidemia, Chronic hypoxic respiratory failure.  Sonographer:    Elizabeth [name] Referring Phys: [id number] Elizabeth [name] IMPRESSIONS  1. Left ventricular ejection fraction, by estimation, is 60 to 65%. The left ventricle has normal function. The left ventricle has no regional wall motion abnormalities. Left ventricular diastolic parameters were normal.  2. Right  ventricular systolic function is low normal. The right ventricular size is mildly enlarged. There is severely elevated pulmonary artery systolic pressure.  3. Left atrial size was severely dilated.  4. Right atrial size was moderately dilated.  5. Mild mitral valve regurgitation.  6. AV is thickened, calcified with minimally restricted motion. . The aortic valve is tricuspid. Aortic valve regurgitation is trivial.  7. The inferior vena cava is dilated in size with <50% respiratory variability, suggesting right atrial pressure of 15 mmHg. Comparison(s): The left ventricular function is unchanged. FINDINGS  Left Ventricle: Left ventricular ejection fraction, by estimation, is 60 to 65%. The left ventricle has normal function. The left ventricle has no regional wall motion abnormalities. The left ventricular internal cavity size was normal in size. There is  no left ventricular hypertrophy. Left ventricular diastolic parameters were normal. Right Ventricle: The right ventricular size is mildly enlarged. Right vetricular wall thickness was not assessed. Right ventricular systolic function is low normal. There is severely elevated pulmonary artery systolic pressure. The tricuspid regurgitant velocity is 3.44 m/s, and with an assumed right atrial pressure of 15 mmHg, the estimated right ventricular systolic pressure is 62.3 mmHg. Left Atrium: Left atrial size was severely dilated. Right Atrium: Right atrial size was moderately dilated. Pericardium: There is no evidence of  pericardial effusion. Mitral Valve: There is mild thickening of the mitral valve leaflet(s). Mild to moderate mitral annular calcification. Mild mitral valve regurgitation. MV peak gradient, 10.8 mmHg. The mean mitral valve gradient is 4.0 mmHg. Tricuspid Valve: The tricuspid valve is normal in structure. Tricuspid valve regurgitation is mild. Aortic Valve: AV is thickened, calcified with minimally restricted motion. The aortic valve is tricuspid. Aortic valve regurgitation is trivial. Aortic valve mean gradient measures 10.3 mmHg. Aortic valve peak gradient measures 22.9 mmHg. Aortic valve area, by VTI measures 2.75 cm. Pulmonic Valve: The pulmonic valve was grossly normal. Pulmonic valve regurgitation is trivial. No evidence of pulmonic stenosis. Aorta: The aortic root and ascending aorta are structurally normal, with no evidence of dilitation. Venous: The inferior vena cava is dilated in size with less than 50% respiratory variability, suggesting right atrial pressure of 15 mmHg. IAS/Shunts: No atrial level shunt detected by color flow Doppler.  LEFT VENTRICLE PLAX 2D LVIDd:         5.40 cm      Diastology LVIDs:         3.60 cm      LV e' medial:    9.14 cm/s LV PW:         1.10 cm      LV E/e' medial:  16.8 LV IVS:        1.00 cm      LV e' lateral:   11.70 cm/s LVOT diam:     2.40 cm      LV E/e' lateral: 13.2 LV SV:         145 LV SV Index:   70 LVOT Area:     4.52 cm  LV Volumes (MOD) LV vol d, MOD A2C: 197.0 ml LV vol d, MOD A4C: 184.0 ml LV vol s, MOD A2C: 80.1 ml LV vol s, MOD A4C: 71.9 ml LV SV MOD A2C:     116.9 ml LV SV MOD A4C:     184.0 ml LV SV MOD BP:      122.5 ml RIGHT VENTRICLE             IVC RV Basal diam:  5.40 cm  IVC diam: 3.10 cm RV S prime:     12.40 cm/s TAPSE (M-mode): 1.9 cm RVSP:           62.3 mmHg LEFT ATRIUM              Index        RIGHT ATRIUM            Index LA diam:        3.40 cm  1.63 cm/m   RA Pressure: 15.00 mmHg LA Vol (A2C):   107.0 ml 51.21 ml/m  RA Area:      22.80 cm LA Vol (A4C):   132.0 ml 63.18 ml/m  RA Volume:   79.40 ml   38.00 ml/m LA Biplane Vol: 124.0 ml 59.35 ml/m  AORTIC VALVE                     PULMONIC VALVE AV Area (Vmax):    2.74 cm      PV Vmax:          1.18 m/s AV Area (Vmean):   2.77 cm      PV Peak grad:     5.6 mmHg AV Area (VTI):     2.75 cm      PR End Diast Vel: 1.29 msec AV Vmax:           239.33 cm/s AV Vmean:          151.000 cm/s AV VTI:            0.529 m AV Peak Grad:      22.9 mmHg AV Mean Grad:      10.3 mmHg LVOT Vmax:         145.00 cm/s LVOT Vmean:        92.600 cm/s LVOT VTI:          0.321 m LVOT/AV VTI ratio: 0.61  AORTA Ao Root diam: 3.30 cm Ao Asc diam:  3.80 cm MITRAL VALVE                TRICUSPID VALVE MV Area (PHT): 3.87 cm     TR Peak grad:   47.3 mmHg MV Area VTI:   2.68 cm     TR Vmax:        344.00 cm/s MV Peak grad:  10.8 mmHg    Estimated RAP:  15.00 mmHg MV Mean grad:  4.0 mmHg     RVSP:           62.3 mmHg MV Vmax:       1.64 m/s MV Vmean:      92.8 cm/s    SHUNTS MV Decel Time: 196 msec     Systemic VTI:  0.32 m MR Peak grad: 90.6 mmHg     Systemic Diam: 2.40 cm MR Vmax:      476.00 cm/s MV E velocity: 154.00 cm/s MV A velocity: 137.00 cm/s MV E/A ratio:  1.12 Vina Gull MD Electronically signed by Vina Gull MD Signature Date/Time: 01/23/2024/5:34:49 PM    Final    CT HEAD WO CONTRAST Result Date: 01/22/2024 CLINICAL DATA:  Mental status change EXAM: CT HEAD WITHOUT CONTRAST TECHNIQUE: Contiguous axial images were obtained from the base of the skull through the vertex without intravenous contrast. RADIATION DOSE REDUCTION: This exam was performed according to the departmental dose-optimization program which includes automated exposure control, adjustment of the mA and/or kV according to patient size and/or use of iterative reconstruction technique. COMPARISON:  MRI 08/06/2022, CT brain 09/14/2023 FINDINGS: Brain: No acute territorial infarction, hemorrhage or intracranial mass. Mild chronic small vessel  ischemic changes of the white matter. Nonenlarged ventricles Vascular: No hyperdense vessels.  Carotid vascular calcification Skull: Normal. Negative for fracture or focal lesion. Sinuses/Orbits: Patchy mucosal thickening right ethmoid sinus. Is incompletely visualized complex mass in the right sinus with calcification and hyperdensity, protruding into the right nasal cavity as before. Chronic right maxillary sinus wall thickening. Other: None IMPRESSION: 1. No CT evidence for acute intracranial abnormality. 2. Mild chronic small vessel ischemic changes of the white matter. 3. Incompletely visualized complex mass in the right sinus with calcification and hyperdensity, protruding into the right nasal cavity as before. Reference facial CT 10/14/2022 Electronically Signed   By: Luke Bun M.D.   On: 01/22/2024 21:57   DG Chest Port 1 View Result Date: 01/22/2024 CLINICAL DATA:  Fatigue, weakness, and lethargy. EXAM: PORTABLE CHEST 1 VIEW COMPARISON:  Portable chest 12/06/2023. FINDINGS: The heart is moderately enlarged. Stable mediastinum with aortic atherosclerosis and tortuosity. There is central vascular engorgement, perihilar ground-glass and septal edema, and moderate pleural effusions. Findings consistent with CHF or fluid overload with similar findings previously. There are patchy opacities in the lower lung fields which could be atelectasis or consolidation. Airspace edema is also possible. The upper lung fields without focal infiltrates. Multiple surgical clips are again noted at the neck base. No new osseous findings.  Advanced thoracic spondylosis. IMPRESSION: 1. Cardiomegaly with central vascular engorgement, perihilar ground-glass and septal edema, and moderate pleural effusions. Findings consistent with CHF or fluid overload with similar findings previously. 2. Patchy opacities in the lower lung fields which could be atelectasis, consolidation or airspace edema. Electronically Signed   By: Francis Quam M.D.   On: 01/22/2024 21:03     Labs:   Basic Metabolic Panel: Recent Labs  Lab 01/22/24 2040 01/22/24 2334 01/23/24 0349 01/23/24 1618 01/24/24 0249 01/25/24 0351 01/26/24 0349  NA 135 135 139  --  136 140 138  K 6.7* 6.1* 5.7*   < > 5.0 4.2 3.8  CL 96* 96* 97*  --  96* 98 100  CO2 30 28 33*  --  34* 33* 32  GLUCOSE 119* 132* 115*  --  102* 88 80  BUN 26* 27* 28*  --  24* 16 16  CREATININE 1.26* 1.18* 1.09*  --  0.72 0.55 0.58  CALCIUM  10.0 14.3* 10.0  --  9.5 9.8 9.2  MG 2.3  --   --   --  2.0 1.7 1.6*  PHOS  --   --   --   --  1.8*  --  3.5   < > = values in this interval not displayed.   GFR Estimated Creatinine Clearance: 65.1 mL/min (by C-G formula based on SCr of 0.58 mg/dL). Liver Function Tests: Recent Labs  Lab 01/22/24 2040 01/23/24 0349 01/24/24 0249  AST 16 11* 11*  ALT 7 6 <5  ALKPHOS 59 51 46  BILITOT 0.3 0.2 <0.2  PROT 7.0 5.8* 5.7*  ALBUMIN 3.8 3.3* 3.3*   No results for input(s): LIPASE, AMYLASE in the last 168 hours. Recent Labs  Lab 01/22/24 2107 01/24/24 0249  AMMONIA 85* 37*   Coagulation profile Recent Labs  Lab 01/23/24 0349  INR 1.0    CBC: Recent Labs  Lab 01/22/24 2040 01/23/24 0349 01/24/24 0249 01/25/24 0351 01/26/24 0349  WBC 4.7 3.6* 6.9 5.0 6.3  NEUTROABS 3.6  --   --   --   --  HGB 9.2* 8.1* 7.5* 8.2* 7.5*  HCT 34.5* 29.7* 24.8* 27.3* 24.2*  MCV 109.9* 107.2* 99.6 98.6 96.0  PLT 186 133* 172 171 184   Cardiac Enzymes: No results for input(s): CKTOTAL, CKMB, CKMBINDEX, TROPONINI in the last 168 hours. BNP: Invalid input(s): POCBNP CBG: Recent Labs  Lab 01/25/24 1603 01/25/24 1951 01/26/24 0013 01/26/24 0359 01/26/24 0728  GLUCAP 147* 160* 75 74 85   D-Dimer No results for input(s): DDIMER in the last 72 hours. Hgb A1c No results for input(s): HGBA1C in the last 72 hours. Lipid Profile No results for input(s): CHOL, HDL, LDLCALC, TRIG, CHOLHDL, LDLDIRECT in  the last 72 hours. Thyroid  function studies No results for input(s): TSH, T4TOTAL, T3FREE, THYROIDAB in the last 72 hours.  Invalid input(s): FREET3 Anemia work up No results for input(s): VITAMINB12, FOLATE, FERRITIN, TIBC, IRON, RETICCTPCT in the last 72 hours. Microbiology Recent Results (from the past 240 hours)  MRSA Next Gen by PCR, Nasal     Status: Abnormal   Collection Time: 01/23/24  2:34 AM   Specimen: Nasal Mucosa; Nasal Swab  Result Value Ref Range Status   MRSA by PCR Next Gen DETECTED (A) NOT DETECTED Final    Comment: CRITICAL RESULT CALLED TO, READ BACK BY AND VERIFIED WITH: KARAMGWA P. [id number] 01/23/24 MCLEAN K. (NOTE) The GeneXpert MRSA Assay (FDA approved for NASAL specimens only), is one component of a comprehensive MRSA colonization surveillance program. It is not intended to diagnose MRSA infection nor to guide or monitor treatment for MRSA infections. Test performance is not FDA approved in patients less than 69 years old. Performed at [facility], [address] [place], [place] [address]      Discharge Instructions:   Discharge Instructions     Diet - low sodium heart healthy   Complete by: As directed    Discharge instructions   Complete by: As directed    Follow-up with your primary care provider at the skilled nursing facility in 3 to 5 days.  Check your blood work at that time.  Continue BiPAP at the facility.  Seek medical attention for worsening symptoms.   Discharge wound care:   Complete by: As directed    Pressure injury care.   Increase activity slowly   Complete by: As directed    No wound care   Complete by: As directed       Allergies as of 01/26/2024   No Known Allergies      Medication List     TAKE these medications    acetaminophen  500 MG tablet Commonly known as: TYLENOL  Take 500 mg by mouth 3 (three) times daily.   albuterol  108 (90 Base) MCG/ACT inhaler Commonly known as:  VENTOLIN  HFA Inhale 2 puffs into the lungs every 6 (six) hours as needed for shortness of breath.   albuterol  (2.5 MG/3ML) 0.083% nebulizer solution Commonly known as: PROVENTIL  Take 2.5 mg by nebulization every 6 (six) hours.   amLODipine  10 MG tablet Commonly known as: NORVASC  Take 1 tablet (10 mg total) by mouth daily.   apixaban  5 MG Tabs tablet Commonly known as: ELIQUIS  Take 1 tablet (5 mg total) by mouth 2 (two) times daily.   BIOFREEZE COOL THE PAIN EX Apply 1 application  topically in the morning and at bedtime. Apply to both hands, ankles   bisacodyl  5 MG EC tablet Commonly known as: DULCOLAX Take 2 tablets (10 mg total) by mouth daily as needed for moderate constipation or severe constipation.  celecoxib 200 MG capsule Commonly known as: CELEBREX Take 200 mg by mouth daily.   CENTRUM SILVER PO Take 1 tablet by mouth daily.   Cetirizine  HCl 10 MG Caps Take 10 mg by mouth daily.   cyanocobalamin  1000 MCG tablet Commonly known as: VITAMIN B12 Take 1,000 mcg by mouth daily.   diclofenac  Sodium 1 % Gel Commonly known as: VOLTAREN  Apply 4 g topically 4 (four) times daily. Apply to bilateral knees   docusate sodium  100 MG capsule Commonly known as: COLACE Take 1 capsule (100 mg total) by mouth 2 (two) times daily as needed for mild constipation.   donepezil  5 MG tablet Commonly known as: ARICEPT  Take 1 tablet (5 mg total) by mouth daily. What changed: when to take this   fluticasone  50 MCG/ACT nasal spray Commonly known as: FLONASE  PLACE 1 SPRAY INTO BOTH NOSTRILS 2 (TWO) TIMES DAILY   fluticasone -salmeterol 250-50 MCG/ACT Aepb Commonly known as: ADVAIR Inhale 1 puff into the lungs in the morning and at bedtime.   furosemide  40 MG tablet Commonly known as: Lasix  Take 1 tablet (40 mg total) by mouth daily as needed. Weigh yourself daily.  Take one dose by mouth if your weight increases by more than 2 pounds in one day or 5 pounds in one week.   HumaLOG  KwikPen 100 UNIT/ML KwikPen Generic drug: insulin  lispro Inject into the skin.   hydrALAZINE  50 MG tablet Commonly known as: APRESOLINE  Take 50 mg by mouth every 6 (six) hours.   insulin  aspart 100 UNIT/ML injection Commonly known as: novoLOG  Before each meal 3 times a day, 140-199 - 2 units, 200-250 - 4 units, 251-299 - 6 units,  300-349 - 8 units,  350 or above call your MD.   losartan  50 MG tablet Commonly known as: COZAAR  Take 1 tablet (50 mg total) by mouth daily.   magnesium  oxide 400 (240 Mg) MG tablet Commonly known as: MAG-OX Take 1 tablet (400 mg total) by mouth 2 (two) times daily.   melatonin 3 MG Tabs tablet Take 6 mg by mouth at bedtime.   metFORMIN 500 MG tablet Commonly known as: GLUCOPHAGE Take 500 mg by mouth daily.   oxybutynin  10 MG 24 hr tablet Commonly known as: DITROPAN -XL Take 10 mg by mouth daily.   OXYGEN  Inhale 2-3 L/min into the lungs See admin instructions. When using CPAP and night, bleeding O2 3L. Oxygen  via nasal cannula at 2 L/min as needed for shortness of breath or sats < 90%.   pantoprazole  40 MG tablet Commonly known as: PROTONIX  Take 40 mg by mouth daily.   polyethylene glycol 17 g packet Commonly known as: MIRALAX  / GLYCOLAX  Take 17 g by mouth daily as needed for mild constipation.   potassium chloride  10 MEQ tablet Commonly known as: KLOR-CON  Take 10 mEq by mouth daily.   rosuvastatin  5 MG tablet Commonly known as: CRESTOR  Take 5 mg by mouth at bedtime.   senna 8.6 MG tablet Commonly known as: SENOKOT Take 17.2 mg by mouth daily.   sertraline  25 MG tablet Commonly known as: ZOLOFT  Take 25 mg by mouth daily.   sodium phosphate Enem Place 1 enema rectally once as needed (constipation).   Systane Complete 0.6 % Soln Generic drug: Propylene Glycol Place 1 drop into both eyes every 6 (six) hours as needed (dry eyes).   UNABLE TO FIND Inhale 1 Device into the lungs See admin instructions. Apply CPAP settings 10/5 with  bleeding O2 3L into CPAP at bedtime and  as needed at bedtime for shortness of breath, wheezing. Must document placement in progress note               Durable Medical Equipment  (From admission, onward)           Start     Ordered   01/23/24 1108  For home use only DME Bipap  Once       Question Answer Comment  Length of Need Lifetime   Bleed in oxygen  (LPM) 3   Keep O2 saturation between 90-98%      01/23/24 1108              Discharge Care Instructions  (From admission, onward)           Start     Ordered   01/26/24 0000  Discharge wound care:       Comments: Pressure injury care.   01/26/24 1029            Contact information for after-discharge care     Destination     [facility] .   Service: Skilled Nursing Contact information: [address] [place]   [address] [phone]                      Time coordinating discharge: 39 minutes  Signed:  Sherilynn Dieu  [facility] 01/26/2024, 10:34 AM

## 2024-01-27 ENCOUNTER — Telehealth: Payer: Self-pay | Admitting: *Deleted

## 2024-01-27 DIAGNOSIS — R413 Other amnesia: Secondary | ICD-10-CM | POA: Diagnosis not present

## 2024-01-27 DIAGNOSIS — M6281 Muscle weakness (generalized): Secondary | ICD-10-CM | POA: Diagnosis not present

## 2024-01-27 DIAGNOSIS — I110 Hypertensive heart disease with heart failure: Secondary | ICD-10-CM | POA: Diagnosis not present

## 2024-01-27 DIAGNOSIS — J9602 Acute respiratory failure with hypercapnia: Secondary | ICD-10-CM | POA: Diagnosis not present

## 2024-01-27 DIAGNOSIS — J9610 Chronic respiratory failure, unspecified whether with hypoxia or hypercapnia: Secondary | ICD-10-CM | POA: Diagnosis not present

## 2024-01-27 DIAGNOSIS — I5032 Chronic diastolic (congestive) heart failure: Secondary | ICD-10-CM | POA: Diagnosis not present

## 2024-01-27 DIAGNOSIS — E119 Type 2 diabetes mellitus without complications: Secondary | ICD-10-CM | POA: Diagnosis not present

## 2024-01-27 DIAGNOSIS — Z9989 Dependence on other enabling machines and devices: Secondary | ICD-10-CM | POA: Diagnosis not present

## 2024-01-27 DIAGNOSIS — I2510 Atherosclerotic heart disease of native coronary artery without angina pectoris: Secondary | ICD-10-CM | POA: Diagnosis not present

## 2024-01-27 DIAGNOSIS — K219 Gastro-esophageal reflux disease without esophagitis: Secondary | ICD-10-CM | POA: Diagnosis not present

## 2024-01-27 DIAGNOSIS — G4733 Obstructive sleep apnea (adult) (pediatric): Secondary | ICD-10-CM | POA: Diagnosis not present

## 2024-01-27 DIAGNOSIS — G9341 Metabolic encephalopathy: Secondary | ICD-10-CM | POA: Diagnosis not present

## 2024-01-27 DIAGNOSIS — K5900 Constipation, unspecified: Secondary | ICD-10-CM | POA: Diagnosis not present

## 2024-01-27 DIAGNOSIS — Z9181 History of falling: Secondary | ICD-10-CM | POA: Diagnosis not present

## 2024-01-27 DIAGNOSIS — I509 Heart failure, unspecified: Secondary | ICD-10-CM | POA: Diagnosis not present

## 2024-01-27 DIAGNOSIS — Z794 Long term (current) use of insulin: Secondary | ICD-10-CM | POA: Diagnosis not present

## 2024-01-27 DIAGNOSIS — J45909 Unspecified asthma, uncomplicated: Secondary | ICD-10-CM | POA: Diagnosis not present

## 2024-01-27 DIAGNOSIS — R2689 Other abnormalities of gait and mobility: Secondary | ICD-10-CM | POA: Diagnosis not present

## 2024-01-27 NOTE — Telephone Encounter (Signed)
 Copied from CRM [id number]. Topic: Clinical - Medical Advice >> Jan 23, 2024  7:44 AM Nathanel DEL wrote: Reason for CRM:  [name] daughter calling b/c pt is in ICU for too much CO2 in her system. Daughter states the roommate at the asst living says the pt told her she has not had her CPAP on for 4 nights.    She wants Dr Neda to know.  Her phone is [phone]    Respiratory failure with hypercapnia and hypoxia - Reactive airway disease - Acute on chronic.  Patient noted was noted to be hypercapnic with initial PCO2 73.9 on venous blood gas. Continue CPAP at night while here and at SNF, of note patient is noncompliant with her CPAP has been counseled, clinically hypercapnia and hypoxia have resolved.

## 2024-01-29 ENCOUNTER — Ambulatory Visit: Admitting: Physician Assistant

## 2024-01-29 DIAGNOSIS — E119 Type 2 diabetes mellitus without complications: Secondary | ICD-10-CM | POA: Diagnosis not present

## 2024-01-29 DIAGNOSIS — J9612 Chronic respiratory failure with hypercapnia: Secondary | ICD-10-CM | POA: Diagnosis not present

## 2024-01-29 DIAGNOSIS — I10 Essential (primary) hypertension: Secondary | ICD-10-CM | POA: Diagnosis not present

## 2024-01-29 DIAGNOSIS — I5030 Unspecified diastolic (congestive) heart failure: Secondary | ICD-10-CM | POA: Diagnosis not present

## 2024-01-29 DIAGNOSIS — G4733 Obstructive sleep apnea (adult) (pediatric): Secondary | ICD-10-CM | POA: Diagnosis not present

## 2024-02-01 ENCOUNTER — Other Ambulatory Visit: Payer: Self-pay

## 2024-02-01 ENCOUNTER — Encounter (HOSPITAL_COMMUNITY): Payer: Self-pay

## 2024-02-01 ENCOUNTER — Emergency Department (HOSPITAL_COMMUNITY)

## 2024-02-01 ENCOUNTER — Observation Stay (HOSPITAL_COMMUNITY)
Admission: EM | Admit: 2024-02-01 | Discharge: 2024-02-02 | Disposition: A | Discharge status: Skilled Nursing Facility | Source: Skilled Nursing Facility | Attending: Internal Medicine | Admitting: Internal Medicine

## 2024-02-01 DIAGNOSIS — E119 Type 2 diabetes mellitus without complications: Secondary | ICD-10-CM | POA: Diagnosis not present

## 2024-02-01 DIAGNOSIS — L89152 Pressure ulcer of sacral region, stage 2: Secondary | ICD-10-CM | POA: Diagnosis not present

## 2024-02-01 DIAGNOSIS — J9602 Acute respiratory failure with hypercapnia: Secondary | ICD-10-CM

## 2024-02-01 DIAGNOSIS — J9621 Acute and chronic respiratory failure with hypoxia: Principal | ICD-10-CM | POA: Insufficient documentation

## 2024-02-01 DIAGNOSIS — Z66 Do not resuscitate: Secondary | ICD-10-CM | POA: Diagnosis not present

## 2024-02-01 DIAGNOSIS — J4521 Mild intermittent asthma with (acute) exacerbation: Secondary | ICD-10-CM

## 2024-02-01 DIAGNOSIS — I272 Pulmonary hypertension, unspecified: Secondary | ICD-10-CM | POA: Diagnosis not present

## 2024-02-01 DIAGNOSIS — D649 Anemia, unspecified: Secondary | ICD-10-CM | POA: Diagnosis not present

## 2024-02-01 DIAGNOSIS — E875 Hyperkalemia: Secondary | ICD-10-CM | POA: Diagnosis present

## 2024-02-01 DIAGNOSIS — R918 Other nonspecific abnormal finding of lung field: Secondary | ICD-10-CM | POA: Diagnosis not present

## 2024-02-01 DIAGNOSIS — Z794 Long term (current) use of insulin: Secondary | ICD-10-CM | POA: Diagnosis not present

## 2024-02-01 DIAGNOSIS — E662 Morbid (severe) obesity with alveolar hypoventilation: Secondary | ICD-10-CM | POA: Diagnosis not present

## 2024-02-01 DIAGNOSIS — R531 Weakness: Secondary | ICD-10-CM | POA: Diagnosis present

## 2024-02-01 DIAGNOSIS — Z79899 Other long term (current) drug therapy: Secondary | ICD-10-CM | POA: Diagnosis not present

## 2024-02-01 DIAGNOSIS — J9622 Acute and chronic respiratory failure with hypercapnia: Principal | ICD-10-CM | POA: Insufficient documentation

## 2024-02-01 DIAGNOSIS — I480 Paroxysmal atrial fibrillation: Secondary | ICD-10-CM | POA: Diagnosis not present

## 2024-02-01 DIAGNOSIS — I10 Essential (primary) hypertension: Secondary | ICD-10-CM | POA: Diagnosis present

## 2024-02-01 DIAGNOSIS — R062 Wheezing: Secondary | ICD-10-CM | POA: Diagnosis not present

## 2024-02-01 DIAGNOSIS — I5032 Chronic diastolic (congestive) heart failure: Secondary | ICD-10-CM | POA: Diagnosis present

## 2024-02-01 DIAGNOSIS — Z7982 Long term (current) use of aspirin: Secondary | ICD-10-CM | POA: Diagnosis not present

## 2024-02-01 DIAGNOSIS — Z7901 Long term (current) use of anticoagulants: Secondary | ICD-10-CM | POA: Diagnosis not present

## 2024-02-01 DIAGNOSIS — I110 Hypertensive heart disease with heart failure: Secondary | ICD-10-CM | POA: Insufficient documentation

## 2024-02-01 DIAGNOSIS — J9601 Acute respiratory failure with hypoxia: Secondary | ICD-10-CM | POA: Diagnosis present

## 2024-02-01 DIAGNOSIS — J9811 Atelectasis: Secondary | ICD-10-CM | POA: Diagnosis not present

## 2024-02-01 DIAGNOSIS — R0602 Shortness of breath: Secondary | ICD-10-CM | POA: Diagnosis not present

## 2024-02-01 DIAGNOSIS — Z6841 Body Mass Index (BMI) 40.0 and over, adult: Secondary | ICD-10-CM | POA: Insufficient documentation

## 2024-02-01 DIAGNOSIS — L8990 Pressure ulcer of unspecified site, unspecified stage: Secondary | ICD-10-CM | POA: Diagnosis present

## 2024-02-01 DIAGNOSIS — I5033 Acute on chronic diastolic (congestive) heart failure: Secondary | ICD-10-CM | POA: Diagnosis present

## 2024-02-01 DIAGNOSIS — G4733 Obstructive sleep apnea (adult) (pediatric): Secondary | ICD-10-CM | POA: Diagnosis present

## 2024-02-01 DIAGNOSIS — F39 Unspecified mood [affective] disorder: Secondary | ICD-10-CM | POA: Insufficient documentation

## 2024-02-01 LAB — HEMOGLOBIN A1C
Hgb A1c MFr Bld: 4.4 % — ABNORMAL LOW (ref 4.8–5.6)
Mean Plasma Glucose: 79.58 mg/dL

## 2024-02-01 LAB — IRON AND TIBC
Iron: 25 ug/dL — ABNORMAL LOW (ref 28–170)
Saturation Ratios: 9 % — ABNORMAL LOW (ref 10.4–31.8)
TIBC: 286 ug/dL (ref 250–450)
UIBC: 261 ug/dL

## 2024-02-01 LAB — RESP PANEL BY RT-PCR (RSV, FLU A&B, COVID)  RVPGX2
Influenza A by PCR: NEGATIVE
Influenza B by PCR: NEGATIVE
Resp Syncytial Virus by PCR: NEGATIVE
SARS Coronavirus 2 by RT PCR: NEGATIVE

## 2024-02-01 LAB — CBC WITH DIFFERENTIAL/PLATELET
Abs Immature Granulocytes: 0.07 K/uL (ref 0.00–0.07)
Basophils Absolute: 0 K/uL (ref 0.0–0.1)
Basophils Relative: 0 %
Eosinophils Absolute: 0.1 K/uL (ref 0.0–0.5)
Eosinophils Relative: 2 %
HCT: 26.8 % — ABNORMAL LOW (ref 36.0–46.0)
Hemoglobin: 7.3 g/dL — ABNORMAL LOW (ref 12.0–15.0)
Immature Granulocytes: 2 %
Lymphocytes Relative: 17 %
Lymphs Abs: 0.8 K/uL (ref 0.7–4.0)
MCH: 29.7 pg (ref 26.0–34.0)
MCHC: 27.2 g/dL — ABNORMAL LOW (ref 30.0–36.0)
MCV: 108.9 fL — ABNORMAL HIGH (ref 80.0–100.0)
Monocytes Absolute: 0.5 K/uL (ref 0.1–1.0)
Monocytes Relative: 11 %
Neutro Abs: 3.2 K/uL (ref 1.7–7.7)
Neutrophils Relative %: 68 %
Platelets: 162 K/uL (ref 150–400)
RBC: 2.46 MIL/uL — ABNORMAL LOW (ref 3.87–5.11)
RDW: 14.7 % (ref 11.5–15.5)
WBC: 4.6 K/uL (ref 4.0–10.5)
nRBC: 0 % (ref 0.0–0.2)

## 2024-02-01 LAB — URINALYSIS, ROUTINE W REFLEX MICROSCOPIC
Bilirubin Urine: NEGATIVE
Glucose, UA: NEGATIVE mg/dL
Ketones, ur: NEGATIVE mg/dL
Nitrite: NEGATIVE
Protein, ur: 30 mg/dL — AB
Specific Gravity, Urine: 1.008 (ref 1.005–1.030)
pH: 5 (ref 5.0–8.0)

## 2024-02-01 LAB — BLOOD GAS, VENOUS
Acid-Base Excess: 5 mmol/L — ABNORMAL HIGH (ref 0.0–2.0)
Bicarbonate: 36.7 mmol/L — ABNORMAL HIGH (ref 20.0–28.0)
O2 Saturation: 99.2 %
Patient temperature: 37
pCO2, Ven: 96 mmHg (ref 44–60)
pH, Ven: 7.19 — CL (ref 7.25–7.43)
pO2, Ven: 87 mmHg — ABNORMAL HIGH (ref 32–45)

## 2024-02-01 LAB — BASIC METABOLIC PANEL WITH GFR
Anion gap: 5 (ref 5–15)
BUN: 24 mg/dL — ABNORMAL HIGH (ref 8–23)
CO2: 33 mmol/L — ABNORMAL HIGH (ref 22–32)
Calcium: 9.9 mg/dL (ref 8.9–10.3)
Chloride: 103 mmol/L (ref 98–111)
Creatinine, Ser: 0.86 mg/dL (ref 0.44–1.00)
GFR, Estimated: >60 mL/min (ref >60)
Glucose, Bld: 95 mg/dL (ref 70–99)
Potassium: 5.9 mmol/L — ABNORMAL HIGH (ref 3.5–5.1)
Sodium: 140 mmol/L (ref 135–145)

## 2024-02-01 LAB — RETICULOCYTES
Immature Retic Fract: 17.6 % — ABNORMAL HIGH (ref 2.3–15.9)
RBC.: 2.56 MIL/uL — ABNORMAL LOW (ref 3.87–5.11)
Retic Count, Absolute: 49.9 K/uL (ref 19.0–186.0)
Retic Ct Pct: 2 % (ref 0.4–3.1)

## 2024-02-01 LAB — MAGNESIUM: Magnesium: 2.7 mg/dL — ABNORMAL HIGH (ref 1.7–2.4)

## 2024-02-01 LAB — HEPATIC FUNCTION PANEL
ALT: 8 U/L (ref 0–44)
AST: 11 U/L — ABNORMAL LOW (ref 15–41)
Albumin: 3.5 g/dL (ref 3.5–5.0)
Alkaline Phosphatase: 52 U/L (ref 38–126)
Bilirubin, Direct: <0.1 mg/dL (ref 0.0–0.2)
Total Bilirubin: <0.2 mg/dL (ref 0.0–1.2)
Total Protein: 6.2 g/dL — ABNORMAL LOW (ref 6.5–8.1)

## 2024-02-01 LAB — VITAMIN B12: Vitamin B-12: 2216 pg/mL — ABNORMAL HIGH (ref 180–914)

## 2024-02-01 LAB — PRO BRAIN NATRIURETIC PEPTIDE: Pro Brain Natriuretic Peptide: 2097 pg/mL — ABNORMAL HIGH (ref <300.0)

## 2024-02-01 LAB — TYPE AND SCREEN
ABO/RH(D): A POS
Antibody Screen: NEGATIVE

## 2024-02-01 LAB — CBG MONITORING, ED: Glucose-Capillary: 149 mg/dL — ABNORMAL HIGH (ref 70–99)

## 2024-02-01 LAB — FERRITIN: Ferritin: 36 ng/mL (ref 11–307)

## 2024-02-01 LAB — FOLATE: Folate: 4.8 ng/mL — ABNORMAL LOW (ref >5.9)

## 2024-02-01 LAB — MRSA NEXT GEN BY PCR, NASAL: MRSA by PCR Next Gen: NOT DETECTED

## 2024-02-01 LAB — GLUCOSE, CAPILLARY: Glucose-Capillary: 83 mg/dL (ref 70–99)

## 2024-02-01 MED ORDER — SODIUM CHLORIDE 0.9% FLUSH
3.0000 mL | Freq: Two times a day (BID) | INTRAVENOUS | Status: DC
  Administered 2024-02-02: 3 mL via INTRAVENOUS

## 2024-02-01 MED ORDER — CHLORHEXIDINE GLUCONATE CLOTH 2 % EX PADS: 6.0000 | MEDICATED_PAD | Freq: Every day | CUTANEOUS | Status: DC

## 2024-02-01 MED ORDER — TRAZODONE HCL 50 MG PO TABS: 25.0000 mg | ORAL_TABLET | Freq: Every evening | ORAL | Status: DC | PRN

## 2024-02-01 MED ORDER — POTASSIUM CHLORIDE ER 10 MEQ PO TBCR: 10.0000 meq | EXTENDED_RELEASE_TABLET | Freq: Every day | ORAL | Status: DC

## 2024-02-01 MED ORDER — ALBUTEROL SULFATE (2.5 MG/3ML) 0.083% IN NEBU: 2.5000 mg | INHALATION_SOLUTION | Freq: Four times a day (QID) | RESPIRATORY_TRACT | Status: DC

## 2024-02-01 MED ORDER — ALBUTEROL SULFATE (2.5 MG/3ML) 0.083% IN NEBU
2.5000 mg | INHALATION_SOLUTION | Freq: Four times a day (QID) | RESPIRATORY_TRACT | Status: DC
  Administered 2024-02-02 (×4): 2.5 mg via RESPIRATORY_TRACT
  Filled 2024-02-01 (×4): qty 3

## 2024-02-01 MED ORDER — INSULIN ASPART 100 UNIT/ML IJ SOLN
0.0000 [IU] | Freq: Every day | INTRAMUSCULAR | Status: DC
  Filled 2024-02-01: qty 0.05

## 2024-02-01 MED ORDER — LOSARTAN POTASSIUM 50 MG PO TABS: 50.0000 mg | ORAL_TABLET | Freq: Every day | ORAL | Status: DC

## 2024-02-01 MED ORDER — FUROSEMIDE 10 MG/ML IJ SOLN
40.0000 mg | Freq: Every day | INTRAMUSCULAR | Status: DC
  Administered 2024-02-02: 40 mg via INTRAVENOUS
  Filled 2024-02-01: qty 4

## 2024-02-01 MED ORDER — FLUTICASONE FUROATE-VILANTEROL 200-25 MCG/ACT IN AEPB
1.0000 | INHALATION_SPRAY | Freq: Every day | RESPIRATORY_TRACT | Status: DC
  Administered 2024-02-02: 1 via RESPIRATORY_TRACT
  Filled 2024-02-01: qty 28

## 2024-02-01 MED ORDER — METHYLPREDNISOLONE SODIUM SUCC 125 MG IJ SOLR
125.0000 mg | Freq: Once | INTRAMUSCULAR | Status: AC
  Administered 2024-02-01: 125 mg via INTRAVENOUS
  Filled 2024-02-01: qty 2

## 2024-02-01 MED ORDER — SODIUM CHLORIDE 0.9% FLUSH: 3.0000 mL | INTRAVENOUS | Status: DC | PRN

## 2024-02-01 MED ORDER — FLUTICASONE PROPIONATE 50 MCG/ACT NA SUSP
1.0000 | Freq: Two times a day (BID) | NASAL | Status: DC
  Administered 2024-02-01 – 2024-02-02 (×2): 1 via NASAL
  Filled 2024-02-01: qty 16

## 2024-02-01 MED ORDER — SERTRALINE HCL 25 MG PO TABS
12.5000 mg | ORAL_TABLET | Freq: Every day | ORAL | Status: DC
  Administered 2024-02-02: 12.5 mg via ORAL
  Filled 2024-02-01: qty 0.5

## 2024-02-01 MED ORDER — SODIUM CHLORIDE 0.9 % IV SOLN: 250.0000 mL | INTRAVENOUS | Status: AC | PRN

## 2024-02-01 MED ORDER — OXYBUTYNIN CHLORIDE ER 5 MG PO TB24
10.0000 mg | ORAL_TABLET | Freq: Every day | ORAL | Status: DC
  Administered 2024-02-02: 10 mg via ORAL
  Filled 2024-02-01: qty 2

## 2024-02-01 MED ORDER — ROSUVASTATIN CALCIUM 5 MG PO TABS
5.0000 mg | ORAL_TABLET | Freq: Every day | ORAL | Status: DC
  Administered 2024-02-01: 5 mg via ORAL
  Filled 2024-02-01 (×2): qty 1

## 2024-02-01 MED ORDER — PANTOPRAZOLE SODIUM 40 MG PO TBEC
40.0000 mg | DELAYED_RELEASE_TABLET | Freq: Every day | ORAL | Status: DC
Start: 2024-02-02 — End: 2024-02-03
  Administered 2024-02-02: 40 mg via ORAL
  Filled 2024-02-01: qty 1

## 2024-02-01 MED ORDER — KETOROLAC TROMETHAMINE 15 MG/ML IJ SOLN
15.0000 mg | Freq: Four times a day (QID) | INTRAMUSCULAR | Status: DC | PRN
  Administered 2024-02-02: 15 mg via INTRAVENOUS
  Filled 2024-02-01: qty 1

## 2024-02-01 MED ORDER — PROPYLENE GLYCOL 0.6 % OP SOLN: 1.0000 [drp] | Freq: Four times a day (QID) | OPHTHALMIC | Status: DC | PRN

## 2024-02-01 MED ORDER — ENOXAPARIN SODIUM 60 MG/0.6ML IJ SOSY
50.0000 mg | PREFILLED_SYRINGE | Freq: Every day | INTRAMUSCULAR | Status: DC
  Filled 2024-02-01: qty 0.6

## 2024-02-01 MED ORDER — ONDANSETRON HCL 4 MG/2ML IJ SOLN: 4.0000 mg | Freq: Four times a day (QID) | INTRAMUSCULAR | Status: DC | PRN

## 2024-02-01 MED ORDER — DONEPEZIL HCL 10 MG PO TABS
5.0000 mg | ORAL_TABLET | Freq: Every morning | ORAL | Status: DC
  Administered 2024-02-02: 5 mg via ORAL
  Filled 2024-02-01: qty 1

## 2024-02-01 MED ORDER — SODIUM CHLORIDE 0.9% FLUSH
3.0000 mL | Freq: Two times a day (BID) | INTRAVENOUS | Status: DC
  Administered 2024-02-01 – 2024-02-02 (×2): 3 mL via INTRAVENOUS

## 2024-02-01 MED ORDER — FUROSEMIDE 10 MG/ML IJ SOLN
40.0000 mg | Freq: Once | INTRAMUSCULAR | Status: AC
  Administered 2024-02-01: 40 mg via INTRAVENOUS
  Filled 2024-02-01: qty 4

## 2024-02-01 MED ORDER — SENNA 8.6 MG PO TABS
17.2000 mg | ORAL_TABLET | Freq: Every day | ORAL | Status: DC
  Filled 2024-02-01: qty 2

## 2024-02-01 MED ORDER — ACETAMINOPHEN 325 MG PO TABS: 650.0000 mg | ORAL_TABLET | Freq: Four times a day (QID) | ORAL | Status: DC | PRN

## 2024-02-01 MED ORDER — DEXTROSE 50 % IV SOLN
1.0000 | Freq: Once | INTRAVENOUS | Status: AC
  Administered 2024-02-01: 50 mL via INTRAVENOUS
  Filled 2024-02-01: qty 50

## 2024-02-01 MED ORDER — ALBUTEROL SULFATE (2.5 MG/3ML) 0.083% IN NEBU
2.5000 mg | INHALATION_SOLUTION | Freq: Once | RESPIRATORY_TRACT | Status: AC
  Administered 2024-02-01: 2.5 mg via RESPIRATORY_TRACT
  Filled 2024-02-01: qty 3

## 2024-02-01 MED ORDER — INSULIN ASPART 100 UNIT/ML IV SOLN
10.0000 [IU] | Freq: Once | INTRAVENOUS | Status: AC
  Administered 2024-02-01: 10 [IU] via INTRAVENOUS
  Filled 2024-02-01: qty 0.1

## 2024-02-01 MED ORDER — ACETAMINOPHEN 650 MG RE SUPP: 650.0000 mg | Freq: Four times a day (QID) | RECTAL | Status: DC | PRN

## 2024-02-01 MED ORDER — AMLODIPINE BESYLATE 10 MG PO TABS
10.0000 mg | ORAL_TABLET | Freq: Every day | ORAL | Status: DC
Start: 2024-02-02 — End: 2024-02-03
  Administered 2024-02-02: 10 mg via ORAL
  Filled 2024-02-01: qty 1

## 2024-02-01 MED ORDER — ONDANSETRON HCL 4 MG PO TABS: 4.0000 mg | ORAL_TABLET | Freq: Four times a day (QID) | ORAL | Status: DC | PRN

## 2024-02-01 MED ORDER — APIXABAN 5 MG PO TABS
5.0000 mg | ORAL_TABLET | Freq: Two times a day (BID) | ORAL | Status: DC
  Administered 2024-02-01 – 2024-02-02 (×2): 5 mg via ORAL
  Filled 2024-02-01 (×2): qty 1

## 2024-02-01 MED ORDER — MORPHINE SULFATE (PF) 2 MG/ML IV SOLN: 2.0000 mg | INTRAVENOUS | Status: DC | PRN

## 2024-02-01 MED ORDER — POLYVINYL ALCOHOL 1.4 % OP SOLN: 1.0000 [drp] | Freq: Four times a day (QID) | OPHTHALMIC | Status: DC | PRN

## 2024-02-01 MED ORDER — INSULIN ASPART 100 UNIT/ML IJ SOLN
0.0000 [IU] | Freq: Three times a day (TID) | INTRAMUSCULAR | Status: DC
  Administered 2024-02-02: 4 [IU] via SUBCUTANEOUS
  Filled 2024-02-01: qty 0.2

## 2024-02-01 MED ORDER — HYDRALAZINE HCL 50 MG PO TABS
50.0000 mg | ORAL_TABLET | Freq: Four times a day (QID) | ORAL | Status: DC
  Administered 2024-02-01 – 2024-02-02 (×4): 50 mg via ORAL
  Filled 2024-02-01 (×4): qty 1

## 2024-02-01 NOTE — H&P (Signed)
 History and Physical    Elizabeth [name] FMW:[id number] DOB: [birth year] DOA: 02/01/2024  PCP: Albina GORMAN Dine, MD  Patient coming from: [facility] nursing center Chief Complaint: Wheezing and increased weakness  HPI: Elizabeth [name] is a 83 y.o. female with medical history significant of diabetes type 2, asthma, hypertension, gastroesophageal reflux disease, hypercarbic respiratory failure supposed to be on BiPAP at facility, congestive heart failure and arthritis.  Patient arrived via EMS from nursing facility had had a prior hospital stay 10 days ago for hypercapnic respiratory failure she was discharged 6 days ago.  She is on Eliquis  and Lasix  as needed.  She states that since she has been at the facility her CPAP has been applied but only with a nasal triangle that does not cover her mouth.  Patient states that she sleeps with her mouth open and the CPAP has been ineffective.  It is unclear as to whether or not she has actually been given a BiPAP which was what was ordered at discharge from our facility.  In any case the facemask is not effective for this particular patient.  She is currently using a BiPAP with a full triangular nose and mouth covering facemask.  (I have recommended to the patient's family that they take this mask with them at discharge to the facility.)  ED Course: Patient found to be in hypercarbic respiratory failure with a pCO2 in the 90s, increased work of breathing, she was physically weak and short of breath, emanation revealed decreased breath sounds and wheezing, BNP was 2097, CO2 96, hemoglobin 7.3, (she was discharged from our facility with a hemoglobin of 7.6 on 10/6)  Review of Systems: As per HPI otherwise all other systems reviewed and  negative.   Past Medical History:  Diagnosis Date   (HFpEF) heart failure with preserved ejection fraction (HCC) 12/24/2022   Arthritis    Asthma    Breast mass in female    Bronchitis    Bronchitis    Constipation     Cough    GERD (gastroesophageal reflux disease)    Hypertension associated with diabetes (HCC)    Type 2 diabetes mellitus (HCC)    Wheezing     Past Surgical History:  Procedure Laterality Date   ABDOMINAL HYSTERECTOMY  1996   CYSTECTOMY  1980's   back of neck   THYROID  SURGERY  2001    Social History   Social History Narrative   Right handed   Drinks caffeine   One floor home, son lives with her   Retried     reports that she quit smoking about 60 years ago. Her smoking use included cigarettes. She has never used smokeless tobacco. She reports that she does not drink alcohol  and does not use drugs.  No Known Allergies  Family History  Problem Relation Age of Onset   Diabetes Mother    Heart disease Mother    Cancer Maternal Uncle      Prior to Admission medications   Medication Sig Start Date End Date Taking? Authorizing Provider  acetaminophen  (TYLENOL ) 500 MG tablet Take 500 mg by mouth 3 (three) times daily.   Yes [provider]  albuterol  (PROVENTIL  HFA;VENTOLIN  HFA) 108 (90 BASE) MCG/ACT inhaler Inhale 2 puffs into the lungs every 6 (six) hours as needed for shortness of breath.   Yes [provider]  albuterol  (PROVENTIL ) (2.5 MG/3ML) 0.083% nebulizer solution Take 2.5 mg by nebulization every 6 (six) hours.   Yes [provider]  amLODipine  (NORVASC ) 10 MG tablet Take 1 tablet (10 mg total) by mouth daily. 12/08/23  Yes Dennise Lavada POUR, MD  apixaban  (ELIQUIS ) 5 MG TABS tablet Take 1 tablet (5 mg total) by mouth 2 (two) times daily. 04/28/23  Yes Amin, Ankit C, MD  bisacodyl  (DULCOLAX) 5 MG EC tablet Take 2 tablets (10 mg total) by mouth daily as needed for moderate constipation or severe constipation. 04/28/23  Yes Amin, Ankit C, MD  Cetirizine  HCl 10 MG CAPS Take 10 mg by mouth daily.   Yes [provider]  cyanocobalamin  (VITAMIN B12) 1000 MCG tablet Take 1,000 mcg by mouth daily.   Yes [provider]  diclofenac   Sodium (VOLTAREN ) 1 % GEL Apply 4 g topically 4 (four) times daily. Apply to bilateral knees 04/24/21  Yes [provider]  donepezil  (ARICEPT ) 5 MG tablet Take 1 tablet (5 mg total) by mouth daily. Patient taking differently: Take 5 mg by mouth in the morning. 07/30/23  Yes Wertman, Sara E, PA-C  fluticasone  (FLONASE ) 50 MCG/ACT nasal spray PLACE 1 SPRAY INTO BOTH NOSTRILS 2 (TWO) TIMES DAILY 09/13/22  Yes Sood, Vineet, MD  fluticasone -salmeterol (ADVAIR) 250-50 MCG/ACT AEPB Inhale 1 puff into the lungs in the morning and at bedtime.   Yes [provider]  furosemide  (LASIX ) 40 MG tablet Take 1 tablet (40 mg total) by mouth daily as needed. Weigh yourself daily.  Take one dose by mouth if your weight increases by more than 2 pounds in one day or 5 pounds in one week. 09/18/23 09/17/24 Yes Shalhoub, Zachary PARAS, MD  hydrALAZINE  (APRESOLINE ) 50 MG tablet Take 50 mg by mouth every 6 (six) hours. 04/10/23  Yes [provider]  losartan  (COZAAR ) 50 MG tablet Take 1 tablet (50 mg total) by mouth daily. 12/08/23  Yes Singh, Prashant K, MD  melatonin 3 MG TABS tablet Take 6 mg by mouth at bedtime.   Yes [provider]  metFORMIN (GLUCOPHAGE) 500 MG tablet Take 500 mg by mouth daily. 01/22/24  Yes [provider]  Multiple Vitamins-Minerals (CENTRUM SILVER PO) Take 1 tablet by mouth daily.   Yes [provider]  oxybutynin  (DITROPAN -XL) 10 MG 24 hr tablet Take 10 mg by mouth daily. 12/09/22  Yes [provider]  pantoprazole  (PROTONIX ) 40 MG tablet Take 40 mg by mouth daily.   Yes [provider]  polyethylene glycol (MIRALAX  / GLYCOLAX ) 17 g packet Take 17 g by mouth daily as needed for mild constipation.   Yes [provider]  potassium chloride  (KLOR-CON ) 10 MEQ tablet Take 10 mEq by mouth daily.   Yes [provider]  Propylene Glycol (SYSTANE COMPLETE) 0.6 % SOLN Place 1 drop into both eyes every 6 (six) hours as needed (dry  eyes).   Yes [provider]  rosuvastatin  (CRESTOR ) 5 MG tablet Take 5 mg by mouth at bedtime. 12/09/22  Yes [provider]  senna (SENOKOT) 8.6 MG tablet Take 17.2 mg by mouth daily.   Yes [provider]  sertraline  (ZOLOFT ) 25 MG tablet Take 12.5 mg by mouth daily.   Yes [provider]  sodium phosphate (FLEET) ENEM Place 1 enema rectally once as needed (constipation).   Yes [provider]  HUMALOG KWIKPEN 100 UNIT/ML KwikPen Inject into the skin. Patient not taking: Reported on 02/01/2024 12/09/23   [provider]  insulin  aspart (NOVOLOG ) 100 UNIT/ML injection Before each meal 3 times a day, 140-199 - 2 units, 200-250 - 4 units,  251-299 - 6 units,  300-349 - 8 units,  350 or above call your MD. Patient not taking: Reported on 02/01/2024 12/08/23   Singh, Prashant K, MD  magnesium  oxide (MAG-OX) 400 (240 Mg) MG tablet Take 1 tablet (400 mg total) by mouth 2 (two) times daily. Patient not taking: Reported on 02/01/2024 01/26/24   Sonjia Held, MD  OXYGEN  Inhale 2-3 L/min into the lungs See admin instructions. When using CPAP and night, bleeding O2 3L. Oxygen  via nasal cannula at 2 L/min as needed for shortness of breath or sats < 90%.    [provider]  UNABLE TO FIND Inhale 1 Device into the lungs See admin instructions. Apply CPAP settings 10/5 with bleeding O2 3L into CPAP at bedtime and as needed at bedtime for shortness of breath, wheezing. Must document placement in progress note    [provider]    Physical Exam:  Constitutional: NAD, calm, uncomfortable Vitals:   02/01/24 1730 02/01/24 1745 02/01/24 1800 02/01/24 1831  BP:      Pulse: (!) 59 (!) 59 62   Resp: 20 (!) 22 (!) 23   Temp:    (!) 96.2 F (35.7 C)  TempSrc:    Axillary  SpO2: 100% 100% 100%   Weight:      Height:       Eyes: PERRL, lids and conjunctivae normal ENMT: Mucous membranes are moist. Posterior pharynx clear of any exudate or  lesions.Normal dentition.  Neck: normal, supple, no masses, no thyromegaly Respiratory: Coarse to auscultation bilaterally, bilateral wheezing, no crackles.  Increased respiratory effort.  Moderate accessory muscle use.  Cardiovascular: Regular rate and rhythm, no murmurs / rubs / gallops. No extremity edema. 2+ pedal pulses. No carotid bruits.  Abdomen: no tenderness, no masses palpated. No hepatosplenomegaly. Bowel sounds positive.  Musculoskeletal: no clubbing / cyanosis. No joint deformity upper and lower extremities. Good ROM, no contractures. Normal muscle tone.  Skin: no rashes, lesions, ulcers. No induration Neurologic: CN 2-12 grossly intact. Sensation intact, DTR normal. Strength 5/5 in all 4.  Psychiatric: Normal judgment and insight. Alert and oriented x 3. Normal mood.    Labs on Admission: I have personally reviewed following labs and imaging studies  CBC: Recent Labs  Lab 01/26/24 0349 02/01/24 1600  WBC 6.3 4.6  NEUTROABS  --  3.2  HGB 7.5* 7.3*  HCT 24.2* 26.8*  MCV 96.0 108.9*  PLT 184 162   Basic Metabolic Panel: Recent Labs  Lab 01/26/24 0349 02/01/24 1600  NA 138 140  K 3.8 5.9*  CL 100 103  CO2 32 33*  GLUCOSE 80 95  BUN 16 24*  CREATININE 0.58 0.86  CALCIUM  9.2 9.9  MG 1.6* 2.7*  PHOS 3.5  --    GFR: Estimated Creatinine Clearance: 60.6 mL/min (by C-G formula based on SCr of 0.86 mg/dL). Liver Function Tests: Recent Labs  Lab 02/01/24 1600  AST 11*  ALT 8  ALKPHOS 52  BILITOT <0.2  PROT 6.2*  ALBUMIN 3.5   BNP (last 3 results) Recent Labs    01/22/24 2040 02/01/24 1600  PROBNP 3,135.0* 2,097.0*    CBG: Recent Labs  Lab 01/26/24 0013 01/26/24 0359 01/26/24 0728 01/26/24 1120 02/01/24 1923  GLUCAP 75 74 85 94 149*   Urine analysis:    Component Value Date/Time   COLORURINE YELLOW 01/22/2024 2107   APPEARANCEUR CLOUDY (A) 01/22/2024 2107   LABSPEC 1.020 01/22/2024 2107   PHURINE 5.0 01/22/2024 2107   GLUCOSEU NEGATIVE  01/22/2024  2107   HGBUR NEGATIVE 01/22/2024 2107   BILIRUBINUR NEGATIVE 01/22/2024 2107   KETONESUR NEGATIVE 01/22/2024 2107   PROTEINUR 100 (A) 01/22/2024 2107   UROBILINOGEN 1.0 10/05/2009 1218   NITRITE NEGATIVE 01/22/2024 2107   LEUKOCYTESUR TRACE (A) 01/22/2024 2107    Radiological Exams on Admission: DG Chest Port 1 View Result Date: 02/01/2024 CLINICAL DATA:  Shortness of breath and wheezing. EXAM: PORTABLE CHEST 1 VIEW COMPARISON:  01/22/2024 FINDINGS: Stable cardiac enlargement. Chronic pulmonary venous hypertension suspected with no overt airspace edema. No airspace consolidation or pneumothorax. Left basilar atelectasis. Evaluation for pleural fluid difficult due to body habitus. Small left pleural effusion not excluded. The visualized skeletal structures are unremarkable. IMPRESSION: Pulmonary venous hypertension. Left basilar atelectasis. Small left pleural effusion not excluded. Electronically Signed   By: Marcey Moan M.D.   On: 02/01/2024 15:26    EKG: Independently reviewed.  Sinus rhythm RBBB and LAFB LVH with secondary repolarization abnormality  Assessment/Plan Principal Problem:   Acute respiratory failure with hypoxia and hypercarbia (HCC) Active Problems:   Acute respiratory failure with hypoxia and hypercapnia (HCC)   Acute on chronic diastolic CHF (congestive heart failure) (HCC)   Paroxysmal atrial fibrillation (HCC)   Type 2 diabetes mellitus without complication, without long-term current use of insulin  (HCC)   Essential hypertension   Normocytic anemia   1.  Acute respiratory failure with hypoxia and hypercarbia: Likely brought on by patient not having a properly applied facemask with her respiratory assistance at night.  It is unclear whether she is getting CPAP or BiPAP.  BiPAP has been ordered for her since August but I am not sure if that is what she has been getting as a photograph of the machine showed to me by the patient's daughter was not  clear as to which kind of machine she has.  Will ask social work to contact facility and ensure that she has the right machine. Additionally she will need the proper facemask.  What she is using here in the facility at the hospital is working quite well for her and would recommend that the facility obtain a similar one for her.  Patient instructed to take that with her at discharge. -Check a.m. venous blood gas - Nightly BiPAP  2.  Acute on chronic diastolic congestive heart failure: - May be multifactorial but I wonder if anemia is not playing a role in this as well.  Will recheck hemoglobin in a.m. and would consider blood transfusion if patient has not improved significantly with BiPAP overnight - Patient without renal failure etiology of bleeding is unclear will heme test stool -Add on anemia panel - Patient is on Eliquis   3.  Paroxysmal atrial fibrillation: -Currently rate controlled and on Eliquis .   - Not on any rate control medications - Anticoagulation May be contributing to anemia  4.  Diabetes type 2: - Home diabetes regimen is unclear as it does not appear the patient has been getting her insulin  in some time. - Check A1c - Will provide coverage for elevated blood glucoses - May need new home medication regimen pending blood glucoses  5.  Normocytic anemia: - May be related to losses associated with anticoagulation: - Will heme test stool - Will check iron panel - If patient not significantly improved with a heart failure standpoint tomorrow may benefit from transfusion. - Type and screen sent    DVT prophylaxis: Eliquis  Code Status: DNR DNI limited Family Communication: Spoke to patient's daughter who was present at admission  Disposition Plan: Back to skilled facility Consults called: None Admission status: Inpatient   Zebedee JAYSON Fujisawa MD FACP [facility] Pager [phone]  How to contact the [facility] Attending or Consulting provider 7A - 7P or covering  provider during after hours 7P -7A, for this patient?  Check the care team in [facility] and look for a) attending/consulting [facility] provider listed and b) the [facility] team listed Log into [email or web address] and use [place]'s universal password to access. If you do not have the password, please contact the hospital operator. Locate the [facility] provider you are looking for under [facility] and page to a number that you can be directly reached. If you still have difficulty reaching the provider, please page the [facility] (Director on Call) for the Hospitalists listed on amion for assistance.  If 7PM-7AM, please contact night-coverage [email or web address] Password [facility]  02/01/2024, 7:34 PM

## 2024-02-01 NOTE — ED Triage Notes (Addendum)
 BIB EMS from [facility]. Pt denies any complaints at this time. EMS reported wheezing and gave 10 albuterol  and 1 atrovent. Facility wanted pt checked out for increased weakness. A&Ox4, bedbound.

## 2024-02-01 NOTE — ED Notes (Signed)
 Pt placed on 3L O2 95%

## 2024-02-01 NOTE — Progress Notes (Signed)
 Pt taken off bipap for transport, tolerating well at this time, will put back on or nighttime.

## 2024-02-01 NOTE — ED Provider Notes (Signed)
 [place] EMERGENCY DEPARTMENT AT [facility] Provider Note   CSN: [id number] Arrival date & time: 02/01/24  1342     Patient presents with: Medical Clearance and Shortness of Breath   Elizabeth [name] is a 83 y.o. female.    Shortness of Breath Patient presents for shortness of breath.  Medical history includes DM, asthma, HTN, GERD, OSA, CHF, arthritis.  She arrives via EMS from nursing facility.  She was admitted to the hospital 10 days ago for hypercapnic respiratory failure.  She was discharged 6 days ago.  She is prescribed Eliquis .  She is prescribed Lasix  as needed.  She does not know if she has been getting her Lasix .  Nursing facility noted increased work of breathing.  Patient states that this began last night.  She states that she is supposed to be on CPAP but has not been getting it at her facility.  During the day, she is on 3 L of supplemental oxygen  at baseline.  EMS was called today.  EMS noted wheezing and gave albuterol  and Atrovent prior to arrival.  Patient noted improvement following these breathing treatments.  She continues to endorse shortness of breath and generalized weakness.     Prior to Admission medications   Medication Sig Start Date End Date Taking? Authorizing Provider  acetaminophen  (TYLENOL ) 500 MG tablet Take 500 mg by mouth 3 (three) times daily.    [provider]  albuterol  (PROVENTIL  HFA;VENTOLIN  HFA) 108 (90 BASE) MCG/ACT inhaler Inhale 2 puffs into the lungs every 6 (six) hours as needed for shortness of breath.    [provider]  albuterol  (PROVENTIL ) (2.5 MG/3ML) 0.083% nebulizer solution Take 2.5 mg by nebulization every 6 (six) hours.    [provider]  amLODipine  (NORVASC ) 10 MG tablet Take 1 tablet (10 mg total) by mouth daily. 12/08/23   Dennise Lavada POUR, MD  apixaban  (ELIQUIS ) 5 MG TABS tablet Take 1 tablet (5 mg total) by mouth 2 (two) times daily. 04/28/23   Amin, Ankit C, MD  bisacodyl  (DULCOLAX) 5  MG EC tablet Take 2 tablets (10 mg total) by mouth daily as needed for moderate constipation or severe constipation. 04/28/23   Amin, Ankit C, MD  celecoxib (CELEBREX) 200 MG capsule Take 200 mg by mouth daily. 01/11/24   [provider]  Cetirizine  HCl 10 MG CAPS Take 10 mg by mouth daily.    [provider]  cyanocobalamin  (VITAMIN B12) 1000 MCG tablet Take 1,000 mcg by mouth daily.    [provider]  diclofenac  Sodium (VOLTAREN ) 1 % GEL Apply 4 g topically 4 (four) times daily. Apply to bilateral knees 04/24/21   [provider]  docusate sodium  (COLACE) 100 MG capsule Take 1 capsule (100 mg total) by mouth 2 (two) times daily as needed for mild constipation. 01/26/24   Pokhrel, Laxman, MD  donepezil  (ARICEPT ) 5 MG tablet Take 1 tablet (5 mg total) by mouth daily. Patient taking differently: Take 5 mg by mouth at bedtime. 07/30/23   Wertman, Sara E, PA-C  fluticasone  (FLONASE ) 50 MCG/ACT nasal spray PLACE 1 SPRAY INTO BOTH NOSTRILS 2 (TWO) TIMES DAILY 09/13/22   Shellia Oh, MD  fluticasone -salmeterol (ADVAIR) 250-50 MCG/ACT AEPB Inhale 1 puff into the lungs in the morning and at bedtime.    [provider]  furosemide  (LASIX ) 40 MG tablet Take 1 tablet (40 mg total) by mouth daily as needed. Weigh yourself daily.  Take one dose by mouth if your weight increases  by more than 2 pounds in one day or 5 pounds in one week. 09/18/23 09/17/24  Shalhoub, Zachary PARAS, MD  HUMALOG KWIKPEN 100 UNIT/ML KwikPen Inject into the skin. 12/09/23   [provider]  hydrALAZINE  (APRESOLINE ) 50 MG tablet Take 50 mg by mouth every 6 (six) hours. 04/10/23   [provider]  insulin  aspart (NOVOLOG ) 100 UNIT/ML injection Before each meal 3 times a day, 140-199 - 2 units, 200-250 - 4 units, 251-299 - 6 units,  300-349 - 8 units,  350 or above call your MD. 12/08/23   Dennise Lavada POUR, MD  losartan  (COZAAR ) 50 MG tablet Take 1 tablet (50 mg total) by mouth daily. 12/08/23    Singh, Prashant K, MD  magnesium  oxide (MAG-OX) 400 (240 Mg) MG tablet Take 1 tablet (400 mg total) by mouth 2 (two) times daily. 01/26/24   Pokhrel, Laxman, MD  melatonin 3 MG TABS tablet Take 6 mg by mouth at bedtime.    [provider]  Menthol, Topical Analgesic, (BIOFREEZE COOL THE PAIN EX) Apply 1 application  topically in the morning and at bedtime. Apply to both hands, ankles    [provider]  metFORMIN (GLUCOPHAGE) 500 MG tablet Take 500 mg by mouth daily. 01/22/24   [provider]  Multiple Vitamins-Minerals (CENTRUM SILVER PO) Take 1 tablet by mouth daily.    [provider]  oxybutynin  (DITROPAN -XL) 10 MG 24 hr tablet Take 10 mg by mouth daily. 12/09/22   [provider]  OXYGEN  Inhale 2-3 L/min into the lungs See admin instructions. When using CPAP and night, bleeding O2 3L. Oxygen  via nasal cannula at 2 L/min as needed for shortness of breath or sats < 90%.    [provider]  pantoprazole  (PROTONIX ) 40 MG tablet Take 40 mg by mouth daily.    [provider]  polyethylene glycol (MIRALAX  / GLYCOLAX ) 17 g packet Take 17 g by mouth daily as needed for mild constipation.    [provider]  potassium chloride  (KLOR-CON ) 10 MEQ tablet Take 10 mEq by mouth daily.    [provider]  Propylene Glycol (SYSTANE COMPLETE) 0.6 % SOLN Place 1 drop into both eyes every 6 (six) hours as needed (dry eyes).    [provider]  rosuvastatin  (CRESTOR ) 5 MG tablet Take 5 mg by mouth at bedtime. 12/09/22   [provider]  senna (SENOKOT) 8.6 MG tablet Take 17.2 mg by mouth daily.    [provider]  sertraline  (ZOLOFT ) 25 MG tablet Take 25 mg by mouth daily.    [provider]  sodium phosphate (FLEET) ENEM Place 1 enema rectally once as needed (constipation).    [provider]  UNABLE TO FIND Inhale 1 Device into the lungs See admin instructions. Apply CPAP settings 10/5 with  bleeding O2 3L into CPAP at bedtime and as needed at bedtime for shortness of breath, wheezing. Must document placement in progress note    [provider]    Allergies: Patient has no known allergies.    Review of Systems  Constitutional:  Positive for fatigue.  Respiratory:  Positive for shortness of breath.   Neurological:  Positive for weakness (Generalized).  All other systems reviewed and are negative.   Updated Vital Signs BP (!) 116/49   Pulse 60   Temp (!) 97.4 F (36.3 C) (Oral)   Resp 18   Ht 5' 3 (1.6 m)   Wt 115 kg   SpO2 100%  Comment: 28% (2-3 lpm dep)  BMI 44.91 kg/m   Physical Exam Vitals and nursing note reviewed.  Constitutional:      General: She is not in acute distress.    Appearance: She is well-developed. She is not toxic-appearing or diaphoretic.  HENT:     Head: Normocephalic and atraumatic.     Mouth/Throat:     Mouth: Mucous membranes are moist.  Eyes:     Conjunctiva/sclera: Conjunctivae normal.  Cardiovascular:     Rate and Rhythm: Normal rate and regular rhythm.     Heart sounds: Murmur heard.  Pulmonary:     Effort: Pulmonary effort is normal. No respiratory distress.     Breath sounds: Decreased breath sounds and wheezing present.  Chest:     Chest wall: No tenderness.  Abdominal:     Palpations: Abdomen is soft.     Tenderness: There is no abdominal tenderness.  Musculoskeletal:        General: No swelling.     Cervical back: Normal range of motion and neck supple.     Right lower leg: No edema.     Left lower leg: No edema.  Skin:    General: Skin is warm and dry.     Coloration: Skin is not cyanotic or pale.  Neurological:     General: No focal deficit present.     Mental Status: She is alert and oriented to person, place, and time.  Psychiatric:        Mood and Affect: Mood normal.        Behavior: Behavior normal.     (all labs ordered are listed, but only abnormal results are displayed) Labs Reviewed  PRO  BRAIN NATRIURETIC PEPTIDE - Abnormal; Notable for the following components:      Result Value   Pro Brain Natriuretic Peptide 2,097.0 (*)    All other components within normal limits  BLOOD GAS, VENOUS - Abnormal; Notable for the following components:   pH, Ven 7.19 (*)    pCO2, Ven 96 (*)    pO2, Ven 87 (*)    Bicarbonate 36.7 (*)    Acid-Base Excess 5.0 (*)    All other components within normal limits  CBC WITH DIFFERENTIAL/PLATELET - Abnormal; Notable for the following components:   RBC 2.46 (*)    Hemoglobin 7.3 (*)    HCT 26.8 (*)    MCV 108.9 (*)    MCHC 27.2 (*)    All other components within normal limits  BASIC METABOLIC PANEL WITH GFR - Abnormal; Notable for the following components:   Potassium 5.9 (*)    CO2 33 (*)    BUN 24 (*)    All other components within normal limits  HEPATIC FUNCTION PANEL - Abnormal; Notable for the following components:   Total Protein 6.2 (*)    AST 11 (*)    All other components within normal limits  MAGNESIUM  - Abnormal; Notable for the following components:   Magnesium  2.7 (*)    All other components within normal limits  RESP PANEL BY RT-PCR (RSV, FLU A&B, COVID)  [id number]  URINALYSIS, ROUTINE W REFLEX MICROSCOPIC    EKG: EKG Interpretation Date/Time:  Sunday February 01 2024 14:42:29 EDT Ventricular Rate:  63 PR Interval:  172 QRS Duration:  139 QT Interval:  433 QTC Calculation: 444 R Axis:   -65  Text Interpretation: 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 EXAM: PORTABLE CHEST 1 VIEW COMPARISON:  01/22/2024 FINDINGS: Stable cardiac enlargement. Chronic pulmonary venous hypertension suspected with no overt airspace edema. No airspace consolidation or pneumothorax. Left basilar atelectasis. Evaluation for pleural fluid difficult due to  body habitus. Small left pleural effusion not excluded. The visualized skeletal structures are unremarkable. IMPRESSION: Pulmonary venous hypertension. Left basilar atelectasis. Small left pleural effusion not excluded. Electronically Signed   By: Marcey Moan M.D.   On: 02/01/2024 15:26     Procedures   Medications Ordered in the ED  furosemide  (LASIX ) injection 40 mg (has no administration in time range)  insulin  aspart (novoLOG ) injection 10 Units (has no administration in time range)    And  dextrose  50 % solution 50 mL (has no administration in time range)  methylPREDNISolone  sodium succinate (SOLU-MEDROL ) 125 mg/2 mL injection 125 mg (125 mg Intravenous Given 02/01/24 1619)  albuterol  (PROVENTIL ) (2.5 MG/3ML) 0.083% nebulizer solution 2.5 mg (2.5 mg Nebulization Given 02/01/24 1523)  albuterol  (PROVENTIL ) (2.5 MG/3ML) 0.083% nebulizer solution 2.5 mg (2.5 mg Nebulization Given 02/01/24 1753)                                    Medical Decision Making Amount and/or Complexity of Data Reviewed Labs: ordered. Radiology: ordered.  Risk OTC drugs. Prescription drug management. Decision regarding hospitalization.   This patient presents to the ED for concern of shortness of breath, this involves an extensive number of treatment options, and is a complaint that carries with it a high risk of complications and morbidity.  The differential diagnosis includes asthma exacerbation, CHF exacerbation, pneumonia, acidosis, anemia, other metabolic derangements   Co morbidities / Chronic conditions that complicate the patient evaluation  DM, asthma, HTN, GERD, OSA, CHF, arthritis   Additional history obtained:  Additional history obtained from EMR External records from outside source obtained and reviewed including patient's family   Lab Tests:  I Ordered, and personally interpreted labs.  The pertinent results include: Respiratory acidosis on blood gas, baseline anemia, no  leukocytosis, normal kidney function, hyperkalemia with otherwise normal electrolytes.   Imaging Studies ordered:  I ordered imaging studies including chest x-ray I independently visualized and interpreted imaging which showed lipase of atelectasis, possible small pleural effusion I agree with the radiologist interpretation   Cardiac Monitoring: / EKG:  The patient was maintained on a cardiac monitor.  I personally viewed and interpreted the cardiac monitored which showed an underlying rhythm of: Sinus rhythm   Problem List / ED Course / Critical interventions / Medication management  Patient presenting for shortness of breath.  On arrival in the ED, she is awake and alert.  She currently has no increased work of breathing.  She does have continued wheezing on lung auscultation in addition to decreased breath sounds, particularly in the bibasilar lung fields.  She is on her baseline 3 L of oxygen  at this time.  Solu-Medrol  and additional albuterol  were ordered.  Workup was initiated.  Blood gas showed a respiratory acidosis.  Patient was started on BiPAP.  Additional albuterol  was ordered.  On reassessment, patient tolerating BiPAP well.  Further lab work notable for hyperkalemia and elevated BNP.  Lasix , dextrose /insulin , and further albuterol  was ordered.  Patient was admitted for further management. I ordered medication including Solu-Medrol  and albuterol  for reactive airway disease exacerbation; Lasix  for  diuresis; insulin /dextrose , and additional albuterol  for hyperkalemia Reevaluation of the patient after these medicines showed that the patient improved I have reviewed the patients home medicines and have made adjustments as needed  Social Determinants of Health:  Resides in nursing facility  CRITICAL CARE Performed by: Bernardino Fireman   Total critical care time: 32 minutes  Critical care time was exclusive of separately billable procedures and treating other patients.  Critical  care was necessary to treat or prevent imminent or life-threatening deterioration.  Critical care was time spent personally by me on the following activities: development of treatment plan with patient and/or surrogate as well as nursing, discussions with consultants, evaluation of patient's response to treatment, examination of patient, obtaining history from patient or surrogate, ordering and performing treatments and interventions, ordering and review of laboratory studies, ordering and review of radiographic studies, pulse oximetry and re-evaluation of patient's condition.      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

## 2024-02-02 DIAGNOSIS — J9622 Acute and chronic respiratory failure with hypercapnia: Secondary | ICD-10-CM | POA: Diagnosis not present

## 2024-02-02 DIAGNOSIS — E875 Hyperkalemia: Secondary | ICD-10-CM | POA: Diagnosis present

## 2024-02-02 LAB — GLUCOSE, CAPILLARY
Glucose-Capillary: 83 mg/dL (ref 70–99)
Glucose-Capillary: 176 mg/dL — ABNORMAL HIGH (ref 70–99)

## 2024-02-02 LAB — BLOOD GAS, VENOUS
Acid-Base Excess: 11.7 mmol/L — ABNORMAL HIGH (ref 0.0–2.0)
Bicarbonate: 36.5 mmol/L — ABNORMAL HIGH (ref 20.0–28.0)
Drawn by: 8473
O2 Saturation: 99.2 %
Patient temperature: 36.9
pCO2, Ven: 49 mmHg (ref 44–60)
pH, Ven: 7.48 — ABNORMAL HIGH (ref 7.25–7.43)
pO2, Ven: 96 mmHg — ABNORMAL HIGH (ref 32–45)

## 2024-02-02 LAB — BASIC METABOLIC PANEL WITH GFR
Anion gap: 9 (ref 5–15)
BUN: 23 mg/dL (ref 8–23)
CO2: 32 mmol/L (ref 22–32)
Calcium: 10.2 mg/dL (ref 8.9–10.3)
Chloride: 99 mmol/L (ref 98–111)
Creatinine, Ser: 0.65 mg/dL (ref 0.44–1.00)
GFR, Estimated: >60 mL/min (ref >60)
Glucose, Bld: 92 mg/dL (ref 70–99)
Potassium: 5.6 mmol/L — ABNORMAL HIGH (ref 3.5–5.1)
Sodium: 140 mmol/L (ref 135–145)

## 2024-02-02 LAB — CBC
HCT: 28 % — ABNORMAL LOW (ref 36.0–46.0)
Hemoglobin: 8.2 g/dL — ABNORMAL LOW (ref 12.0–15.0)
MCH: 30.5 pg (ref 26.0–34.0)
MCHC: 29.3 g/dL — ABNORMAL LOW (ref 30.0–36.0)
MCV: 104.1 fL — ABNORMAL HIGH (ref 80.0–100.0)
Platelets: 184 K/uL (ref 150–400)
RBC: 2.69 MIL/uL — ABNORMAL LOW (ref 3.87–5.11)
RDW: 14.4 % (ref 11.5–15.5)
WBC: 3.9 K/uL — ABNORMAL LOW (ref 4.0–10.5)
nRBC: 0 % (ref 0.0–0.2)

## 2024-02-02 MED ORDER — ORAL CARE MOUTH RINSE
15.0000 mL | OROMUCOSAL | Status: DC
  Administered 2024-02-02 (×2): 15 mL via OROMUCOSAL

## 2024-02-02 MED ORDER — CHLORHEXIDINE GLUCONATE CLOTH 2 % EX PADS
6.0000 | MEDICATED_PAD | Freq: Every day | CUTANEOUS | Status: DC
  Administered 2024-02-01: 6 via TOPICAL

## 2024-02-02 MED ORDER — ORAL CARE MOUTH RINSE: 15.0000 mL | OROMUCOSAL | Status: DC | PRN

## 2024-02-02 MED ORDER — SODIUM ZIRCONIUM CYCLOSILICATE 10 G PO PACK
10.0000 g | PACK | Freq: Once | ORAL | Status: AC
  Administered 2024-02-02: 10 g via ORAL
  Filled 2024-02-02: qty 1

## 2024-02-02 NOTE — Discharge Summary (Signed)
 Physician Discharge Summary   Patient: Elizabeth [name] MRN: [id number] DOB: 08-25-1940  Admit date:     02/01/2024  Discharge date: 02/02/24  Discharge Physician: Delon Herald   PCP: Albina GORMAN Dine, MD   Recommendations at discharge:   You are being discharged back to your nursing facility for LTC Continue nightly BIPAP with facemask - Autopap. EPAP minimum can be 10, pressure support +4, IPAP maximum 20  Follow up BMP in 1-2 days to assess for hyperkalemia Follow up CBC regularly to assess for anemia; transfuse for Hgb <7  Discharge Diagnoses: Principal Problem:   Acute on chronic respiratory failure with hypercapnia (HCC) Active Problems:   Chronic diastolic CHF (congestive heart failure) (HCC)   Paroxysmal atrial fibrillation (HCC)   Essential hypertension   Diabetes mellitus without complication (HCC)   OSA (obstructive sleep apnea)   Morbid obesity (HCC)   Pressure injury of skin   Obesity hypoventilation syndrome (HCC)   Normocytic anemia   Hyperkalemia    Hospital Course: 83yo with h/o T2DM, asthma, HTN, chronic HFpEF, and chronic hypercarbic respiratory failure onBIPAP who presented with wheezing and generalized weakness.  She was previously hospitalized from 10/2-6 for acute on chronic hypercarbic respiratory failure after not wearing CPAP at her facility.  Pulmonology consulted and she was discharged on auto-BIPAP and O2.  On return, she was again found to have hypercarbia with pCO2 96.  BIPAP resumed with normalization of VBG.  Now asymptomatic.  Assessment and Plan:  Acute on chronic respiratory failure with hypoxia and hypercarbia due to OHS/OSA with asthma Likely brought on by patient not having a properly applied facemask with her respiratory assistance at night This has resolved with resumption of appropriate BIPAP TOC team consulted Additionally she will need the proper facemask - patient instructed to take mask from this hospitalization home with her  at discharge Continue nightly BiPAP Continue home O2 Continue home respiratory meds - Albuterol , cetirizine , Flonase , Advair  Hyperkalemia Mild, given Lokelma  Will need BMP f/u at the facility   Chronic diastolic congestive heart failure Concern for volume overload as a contributing factor on admission ProBNP better than prior Appears to be compensated at this time Continue home Lasix   HTN Continue amlodipine , hydralazine , losartan    Paroxysmal atrial fibrillation Currently rate controlled without rate control medications Anticoagulation may be contributing to anemia Continue Eliquis  and follow at facility for now  HLD  Continue rosuvastatin    Diabetes type 2, resolved A1c 4.4 Not diabetic Stop coverage and checking Stop metformin   Normocytic anemia May be related to losses associated with anticoagulation Iron level minimally low Type and screen done Hgb improved today, does not appear to need transfusion at this time Recommend outpatient f/u  MCI/Mood d/o Continue donepezil , sertraline    Pressure injury, POA Wound 02/01/24 2200 Pressure Injury Sacrum Medial Stage 2 -  Partial thickness loss of dermis presenting as a shallow open injury with a red, pink wound bed without slough. (Active)    Morbid/class 3 obesity Body mass index is 43.95 kg/m.Elizabeth [name]  Weight loss should be encouraged Outpatient PCP/bariatric medicine f/u encouraged Significantly low or high BMI is associated with higher medical risk including morbidity and mortality    DNR DNR confirmed at the time of admission [facility] documents reviewed Patient will need a gold out of facility DNR form at the time of discharge        Consultants: CHF navigator TOC team   Procedures: None   Antibiotics: None    Pain control -  [place]  Controlled Substance Reporting System database was reviewed. and patient was instructed, not to drive, operate heavy machinery, perform activities at heights,  swimming or participation in water activities or provide baby-sitting services while on Pain, Sleep and Anxiety Medications; until their outpatient Physician has advised to do so again. Also recommended to not to take more than prescribed Pain, Sleep and Anxiety Medications.   Disposition: Skilled nursing facility Diet recommendation:  Cardiac diet DISCHARGE MEDICATION: Allergies as of 02/02/2024   No Known Allergies      Medication List     STOP taking these medications    HumaLOG KwikPen 100 UNIT/ML KwikPen Generic drug: insulin  lispro   insulin  aspart 100 UNIT/ML injection Commonly known as: novoLOG    metFORMIN 500 MG tablet Commonly known as: GLUCOPHAGE   UNABLE TO FIND       TAKE these medications    acetaminophen  500 MG tablet Commonly known as: TYLENOL  Take 500 mg by mouth 3 (three) times daily.   albuterol  108 (90 Base) MCG/ACT inhaler Commonly known as: VENTOLIN  HFA Inhale 2 puffs into the lungs every 6 (six) hours as needed for shortness of breath.   albuterol  (2.5 MG/3ML) 0.083% nebulizer solution Commonly known as: PROVENTIL  Take 2.5 mg by nebulization every 6 (six) hours.   amLODipine  10 MG tablet Commonly known as: NORVASC  Take 1 tablet (10 mg total) by mouth daily.   apixaban  5 MG Tabs tablet Commonly known as: ELIQUIS  Take 1 tablet (5 mg total) by mouth 2 (two) times daily.   bisacodyl  5 MG EC tablet Commonly known as: DULCOLAX Take 2 tablets (10 mg total) by mouth daily as needed for moderate constipation or severe constipation.   CENTRUM SILVER PO Take 1 tablet by mouth daily.   Cetirizine  HCl 10 MG Caps Take 10 mg by mouth daily.   cyanocobalamin  1000 MCG tablet Commonly known as: VITAMIN B12 Take 1,000 mcg by mouth daily.   diclofenac  Sodium 1 % Gel Commonly known as: VOLTAREN  Apply 4 g topically 4 (four) times daily. Apply to bilateral knees   donepezil  5 MG tablet Commonly known as: ARICEPT  Take 1 tablet (5 mg total) by  mouth daily. What changed: when to take this   fluticasone  50 MCG/ACT nasal spray Commonly known as: FLONASE  PLACE 1 SPRAY INTO BOTH NOSTRILS 2 (TWO) TIMES DAILY   fluticasone -salmeterol 250-50 MCG/ACT Aepb Commonly known as: ADVAIR Inhale 1 puff into the lungs in the morning and at bedtime.   furosemide  40 MG tablet Commonly known as: Lasix  Take 1 tablet (40 mg total) by mouth daily as needed. Weigh yourself daily.  Take one dose by mouth if your weight increases by more than 2 pounds in one day or 5 pounds in one week.   hydrALAZINE  50 MG tablet Commonly known as: APRESOLINE  Take 50 mg by mouth every 6 (six) hours.   losartan  50 MG tablet Commonly known as: COZAAR  Take 1 tablet (50 mg total) by mouth daily.   melatonin 3 MG Tabs tablet Take 6 mg by mouth at bedtime.   oxybutynin  10 MG 24 hr tablet Commonly known as: DITROPAN -XL Take 10 mg by mouth daily.   OXYGEN  Inhale 2-3 L/min into the lungs See admin instructions. When using CPAP and night, bleeding O2 3L. Oxygen  via nasal cannula at 2 L/min as needed for shortness of breath or sats < 90%.   pantoprazole  40 MG tablet Commonly known as: PROTONIX  Take 40 mg by mouth daily.   polyethylene glycol 17 g packet  Commonly known as: MIRALAX  / GLYCOLAX  Take 17 g by mouth daily as needed for mild constipation.   potassium chloride  10 MEQ tablet Commonly known as: KLOR-CON  Take 10 mEq by mouth daily.   rosuvastatin  5 MG tablet Commonly known as: CRESTOR  Take 5 mg by mouth at bedtime.   senna 8.6 MG tablet Commonly known as: SENOKOT Take 17.2 mg by mouth daily.   sertraline  25 MG tablet Commonly known as: ZOLOFT  Take 12.5 mg by mouth daily.   sodium phosphate Enem Place 1 enema rectally once as needed (constipation).   Systane Complete 0.6 % Soln Generic drug: Propylene Glycol Place 1 drop into both eyes every 6 (six) hours as needed (dry eyes).               Durable Medical Equipment  (From admission,  onward)           Start     Ordered   02/02/24 0000  For home use only DME Bipap       Comments: Autopap - EPAP minimum can be 10, pressure support +4, IPAP maximum 20  Question Answer Comment  Length of Need Lifetime   Bleed in oxygen  (LPM) 2   Inspiratory pressure 20   Expiratory pressure 10      02/02/24 1449            Discharge Exam:   Subjective: Feeling ok today.  Reports staying in LTC at SNF since her house has mold.  Understands need for BIPAP with mask. No new issues.   Objective: Vitals:   02/02/24 1400 02/02/24 1434  BP: (!) 161/34   Pulse: 83   Resp: 19   Temp:    SpO2: 96% 94%    Intake/Output Summary (Last 24 hours) at 02/02/2024 1451 Last data filed at 02/02/2024 1042 Gross per 24 hour  Intake 400 ml  Output 1950 ml  Net -1550 ml   Filed Weights   02/01/24 1400 02/01/24 2140 02/02/24 0500  Weight: 115 kg 112 kg 109 kg    Exam:  General:  Appears calm and comfortable and is in NAD Eyes:  normal lids, iris ENT:  grossly normal hearing, lips & tongue, mmm Cardiovascular:  RRR. No LE edema.  Respiratory:   CTA bilaterally with no wheezes/rales/rhonchi.  Normal respiratory effort. Abdomen:  soft, NT, ND Skin:  no rash or induration seen on limited exam Musculoskeletal:  generalized weakness, appears sedentary, no bony abnormality Psychiatric:  grossly normal mood and affect, speech fluent and appropriate, AOx3 Neurologic:  CN 2-12 grossly intact, moves all extremities in coordinated fashion  Data Reviewed: I have reviewed the patient's lab results since admission.  Pertinent labs for today include:  VBG: 7.48/49/96/36.5 K+ 5.6 WBC 3.9 Hgb 8.2     Condition at discharge: stable  The results of significant diagnostics from this hospitalization (including imaging, microbiology, ancillary and laboratory) are listed below for reference.   Imaging Studies: DG Chest Port 1 View Result Date: 02/01/2024 CLINICAL DATA:  Shortness of  breath and wheezing. EXAM: PORTABLE CHEST 1 VIEW COMPARISON:  01/22/2024 FINDINGS: Stable cardiac enlargement. Chronic pulmonary venous hypertension suspected with no overt airspace edema. No airspace consolidation or pneumothorax. Left basilar atelectasis. Evaluation for pleural fluid difficult due to body habitus. Small left pleural effusion not excluded. The visualized skeletal structures are unremarkable. IMPRESSION: Pulmonary venous hypertension. Left basilar atelectasis. Small left pleural effusion not excluded. Electronically Signed   By: Marcey Moan M.D.   On: 02/01/2024 15:26  ECHOCARDIOGRAM COMPLETE Result Date: 01/23/2024    ECHOCARDIOGRAM REPORT   Patient Name:   JAZZMEN RESTIVO Date of Exam: 01/23/2024 Medical Rec #:  [id number]       Height:       63.0 in Accession #:    [id number]      Weight:       240.0 lb Date of Birth:  1940-06-15        BSA:          2.089 m Patient Age:    83 years        BP:           124/37 mmHg Patient Gender: F               HR:           66 bpm. Exam Location:  Inpatient Procedure: 2D Echo, Cardiac Doppler and Color Doppler (Both Spectral and Color            Flow Doppler were utilized during procedure). Indications:    CHF I50.9  History:        Patient has prior history of Echocardiogram examinations, most                 recent 09/15/2023. CHF; Risk Factors:Hypertension. H/O Dementia,                 Hyperlipidemia, Chronic hypoxic respiratory failure.  Sonographer:    BERNARDA ROCKS Referring Phys: [id number] JESSICA MARSHALL IMPRESSIONS  1. Left ventricular ejection fraction, by estimation, is 60 to 65%. The left ventricle has normal function. The left ventricle has no regional wall motion abnormalities. Left ventricular diastolic parameters were normal.  2. Right ventricular systolic function is low normal. The right ventricular size is mildly enlarged. There is severely elevated pulmonary artery systolic pressure.  3. Left atrial size was severely dilated.  4. Right  atrial size was moderately dilated.  5. Mild mitral valve regurgitation.  6. AV is thickened, calcified with minimally restricted motion. . The aortic valve is tricuspid. Aortic valve regurgitation is trivial.  7. The inferior vena cava is dilated in size with <50% respiratory variability, suggesting right atrial pressure of 15 mmHg. Comparison(s): The left ventricular function is unchanged. FINDINGS  Left Ventricle: Left ventricular ejection fraction, by estimation, is 60 to 65%. The left ventricle has normal function. The left ventricle has no regional wall motion abnormalities. The left ventricular internal cavity size was normal in size. There is  no left ventricular hypertrophy. Left ventricular diastolic parameters were normal. Right Ventricle: The right ventricular size is mildly enlarged. Right vetricular wall thickness was not assessed. Right ventricular systolic function is low normal. There is severely elevated pulmonary artery systolic pressure. The tricuspid regurgitant velocity is 3.44 m/s, and with an assumed right atrial pressure of 15 mmHg, the estimated right ventricular systolic pressure is 62.3 mmHg. Left Atrium: Left atrial size was severely dilated. Right Atrium: Right atrial size was moderately dilated. Pericardium: There is no evidence of pericardial effusion. Mitral Valve: There is mild thickening of the mitral valve leaflet(s). Mild to moderate mitral annular calcification. Mild mitral valve regurgitation. MV peak gradient, 10.8 mmHg. The mean mitral valve gradient is 4.0 mmHg. Tricuspid Valve: The tricuspid valve is normal in structure. Tricuspid valve regurgitation is mild. Aortic Valve: AV is thickened, calcified with minimally restricted motion. The aortic valve is tricuspid. Aortic valve regurgitation is trivial. Aortic valve mean gradient measures 10.3 mmHg. Aortic valve peak gradient measures 22.9  mmHg. Aortic valve area, by VTI measures 2.75 cm. Pulmonic Valve: The pulmonic valve  was grossly normal. Pulmonic valve regurgitation is trivial. No evidence of pulmonic stenosis. Aorta: The aortic root and ascending aorta are structurally normal, with no evidence of dilitation. Venous: The inferior vena cava is dilated in size with less than 50% respiratory variability, suggesting right atrial pressure of 15 mmHg. IAS/Shunts: No atrial level shunt detected by color flow Doppler.  LEFT VENTRICLE PLAX 2D LVIDd:         5.40 cm      Diastology LVIDs:         3.60 cm      LV e' medial:    9.14 cm/s LV PW:         1.10 cm      LV E/e' medial:  16.8 LV IVS:        1.00 cm      LV e' lateral:   11.70 cm/s LVOT diam:     2.40 cm      LV E/e' lateral: 13.2 LV SV:         145 LV SV Index:   70 LVOT Area:     4.52 cm  LV Volumes (MOD) LV vol d, MOD A2C: 197.0 ml LV vol d, MOD A4C: 184.0 ml LV vol s, MOD A2C: 80.1 ml LV vol s, MOD A4C: 71.9 ml LV SV MOD A2C:     116.9 ml LV SV MOD A4C:     184.0 ml LV SV MOD BP:      122.5 ml RIGHT VENTRICLE             IVC RV Basal diam:  5.40 cm     IVC diam: 3.10 cm RV S prime:     12.40 cm/s TAPSE (M-mode): 1.9 cm RVSP:           62.3 mmHg LEFT ATRIUM              Index        RIGHT ATRIUM            Index LA diam:        3.40 cm  1.63 cm/m   RA Pressure: 15.00 mmHg LA Vol (A2C):   107.0 ml 51.21 ml/m  RA Area:     22.80 cm LA Vol (A4C):   132.0 ml 63.18 ml/m  RA Volume:   79.40 ml   38.00 ml/m LA Biplane Vol: 124.0 ml 59.35 ml/m  AORTIC VALVE                     PULMONIC VALVE AV Area (Vmax):    2.74 cm      PV Vmax:          1.18 m/s AV Area (Vmean):   2.77 cm      PV Peak grad:     5.6 mmHg AV Area (VTI):     2.75 cm      PR End Diast Vel: 1.29 msec AV Vmax:           239.33 cm/s AV Vmean:          151.000 cm/s AV VTI:            0.529 m AV Peak Grad:      22.9 mmHg AV Mean Grad:      10.3 mmHg LVOT Vmax:         145.00 cm/s LVOT Vmean:  92.600 cm/s LVOT VTI:          0.321 m LVOT/AV VTI ratio: 0.61  AORTA Ao Root diam: 3.30 cm Ao Asc diam:  3.80 cm  MITRAL VALVE                TRICUSPID VALVE MV Area (PHT): 3.87 cm     TR Peak grad:   47.3 mmHg MV Area VTI:   2.68 cm     TR Vmax:        344.00 cm/s MV Peak grad:  10.8 mmHg    Estimated RAP:  15.00 mmHg MV Mean grad:  4.0 mmHg     RVSP:           62.3 mmHg MV Vmax:       1.64 m/s MV Vmean:      92.8 cm/s    SHUNTS MV Decel Time: 196 msec     Systemic VTI:  0.32 m MR Peak grad: 90.6 mmHg     Systemic Diam: 2.40 cm MR Vmax:      476.00 cm/s MV E velocity: 154.00 cm/s MV A velocity: 137.00 cm/s MV E/A ratio:  1.12 Vina Gull MD Electronically signed by Vina Gull MD Signature Date/Time: 01/23/2024/5:34:49 PM    Final    CT HEAD WO CONTRAST Result Date: 01/22/2024 CLINICAL DATA:  Mental status change EXAM: CT HEAD WITHOUT CONTRAST TECHNIQUE: Contiguous axial images were obtained from the base of the skull through the vertex without intravenous contrast. RADIATION DOSE REDUCTION: This exam was performed according to the departmental dose-optimization program which includes automated exposure control, adjustment of the mA and/or kV according to patient size and/or use of iterative reconstruction technique. COMPARISON:  MRI 08/06/2022, CT brain 09/14/2023 FINDINGS: Brain: No acute territorial infarction, hemorrhage or intracranial mass. Mild chronic small vessel ischemic changes of the white matter. Nonenlarged ventricles Vascular: No hyperdense vessels.  Carotid vascular calcification Skull: Normal. Negative for fracture or focal lesion. Sinuses/Orbits: Patchy mucosal thickening right ethmoid sinus. Is incompletely visualized complex mass in the right sinus with calcification and hyperdensity, protruding into the right nasal cavity as before. Chronic right maxillary sinus wall thickening. Other: None IMPRESSION: 1. No CT evidence for acute intracranial abnormality. 2. Mild chronic small vessel ischemic changes of the white matter. 3. Incompletely visualized complex mass in the right sinus with calcification and  hyperdensity, protruding into the right nasal cavity as before. Reference facial CT 10/14/2022 Electronically Signed   By: Luke Bun M.D.   On: 01/22/2024 21:57   DG Chest Port 1 View Result Date: 01/22/2024 CLINICAL DATA:  Fatigue, weakness, and lethargy. EXAM: PORTABLE CHEST 1 VIEW COMPARISON:  Portable chest 12/06/2023. FINDINGS: The heart is moderately enlarged. Stable mediastinum with aortic atherosclerosis and tortuosity. There is central vascular engorgement, perihilar ground-glass and septal edema, and moderate pleural effusions. Findings consistent with CHF or fluid overload with similar findings previously. There are patchy opacities in the lower lung fields which could be atelectasis or consolidation. Airspace edema is also possible. The upper lung fields without focal infiltrates. Multiple surgical clips are again noted at the neck base. No new osseous findings.  Advanced thoracic spondylosis. IMPRESSION: 1. Cardiomegaly with central vascular engorgement, perihilar ground-glass and septal edema, and moderate pleural effusions. Findings consistent with CHF or fluid overload with similar findings previously. 2. Patchy opacities in the lower lung fields which could be atelectasis, consolidation or airspace edema. Electronically Signed   By: Francis Quam M.D.   On: 01/22/2024 21:03  Microbiology: Results for orders placed or performed during the hospital encounter of 02/01/24  Resp panel by RT-PCR (RSV, Flu A&B, Covid) Anterior Nasal Swab     Status: None   Collection Time: 02/01/24  2:43 PM   Specimen: Anterior Nasal Swab  Result Value Ref Range Status   SARS Coronavirus 2 by RT PCR NEGATIVE NEGATIVE Final    Comment: (NOTE) SARS-CoV-2 target nucleic acids are NOT DETECTED.  The SARS-CoV-2 RNA is generally detectable in upper respiratory specimens during the acute phase of infection. The lowest concentration of SARS-CoV-2 viral copies this assay can detect is 138 copies/mL. A  negative result does not preclude SARS-Cov-2 infection and should not be used as the sole basis for treatment or other patient management decisions. A negative result may occur with  improper specimen collection/handling, submission of specimen other than nasopharyngeal swab, presence of viral mutation(s) within the areas targeted by this assay, and inadequate number of viral copies(<138 copies/mL). A negative result must be combined with clinical observations, patient history, and epidemiological information. The expected result is Negative.  Fact Sheet for Patients:  BloggerCourse.com  Fact Sheet for Healthcare Providers:  SeriousBroker.it  This test is no t yet approved or cleared by the [place]  FDA and  has been authorized for detection and/or diagnosis of SARS-CoV-2 by FDA under an Emergency Use Authorization (EUA). This EUA will remain  in effect (meaning this test can be used) for the duration of the COVID-19 declaration under Section 564(b)(1) of the Act, 21 U.S.C.section 360bbb-3(b)(1), unless the authorization is terminated  or revoked sooner.       Influenza A by PCR NEGATIVE NEGATIVE Final   Influenza B by PCR NEGATIVE NEGATIVE Final    Comment: (NOTE) The Xpert Xpress SARS-CoV-2/FLU/RSV plus assay is intended as an aid in the diagnosis of influenza from Nasopharyngeal swab specimens and should not be used as a sole basis for treatment. Nasal washings and aspirates are unacceptable for Xpert Xpress SARS-CoV-2/FLU/RSV testing.  Fact Sheet for Patients: BloggerCourse.com  Fact Sheet for Healthcare Providers: SeriousBroker.it  This test is not yet approved or cleared by the [place]  FDA and has been authorized for detection and/or diagnosis of SARS-CoV-2 by FDA under an Emergency Use Authorization (EUA). This EUA will remain in effect (meaning this test can  be used) for the duration of the COVID-19 declaration under Section 564(b)(1) of the Act, 21 U.S.C. section 360bbb-3(b)(1), unless the authorization is terminated or revoked.     Resp Syncytial Virus by PCR NEGATIVE NEGATIVE Final    Comment: (NOTE) Fact Sheet for Patients: BloggerCourse.com  Fact Sheet for Healthcare Providers: SeriousBroker.it  This test is not yet approved or cleared by the [place]  FDA and has been authorized for detection and/or diagnosis of SARS-CoV-2 by FDA under an Emergency Use Authorization (EUA). This EUA will remain in effect (meaning this test can be used) for the duration of the COVID-19 declaration under Section 564(b)(1) of the Act, 21 U.S.C. section 360bbb-3(b)(1), unless the authorization is terminated or revoked.  Performed at [facility], [address] [place], [place] [address]   MRSA Next Gen by PCR, Nasal     Status: None   Collection Time: 02/01/24 10:17 PM   Specimen: Nasal Mucosa; Nasal Swab  Result Value Ref Range Status   MRSA by PCR Next Gen NOT DETECTED NOT DETECTED Final    Comment: (NOTE) The GeneXpert MRSA Assay (FDA approved for NASAL specimens only), is one component of a comprehensive  MRSA colonization surveillance program. It is not intended to diagnose MRSA infection nor to guide or monitor treatment for MRSA infections. Test performance is not FDA approved in patients less than 63 years old. Performed at [facility], [address], [place], [place] [address]     Labs: CBC: Recent Labs  Lab 02/01/24 1600 02/02/24 0247  WBC 4.6 3.9*  NEUTROABS 3.2  --   HGB 7.3* 8.2*  HCT 26.8* 28.0*  MCV 108.9* 104.1*  PLT 162 184   Basic Metabolic Panel: Recent Labs  Lab 02/01/24 1600 02/02/24 0247  NA 140 140  K 5.9* 5.6*  CL 103 99  CO2 33* 32  GLUCOSE 95 92  BUN 24* 23  CREATININE 0.86 0.65  CALCIUM  9.9 10.2  MG  2.7*  --    Liver Function Tests: Recent Labs  Lab 02/01/24 1600  AST 11*  ALT 8  ALKPHOS 52  BILITOT <0.2  PROT 6.2*  ALBUMIN 3.5   CBG: Recent Labs  Lab 02/01/24 1923 02/01/24 2241 02/02/24 0748 02/02/24 1114  GLUCAP 149* 83 83 176*    Discharge time spent: greater than 30 minutes.  Signed: Delon Herald, MD [facility] 02/02/2024

## 2024-02-02 NOTE — TOC Transition Note (Signed)
 Transition of Care [facility]) - Discharge Note   Patient Details  Name: Elizabeth [name] MRN: [id number] Date of Birth: 04/02/1941  Transition of Care [facility]) CM/SW Contact:  Jon ONEIDA Anon, RN Phone Number: 02/02/2024, 3:30 PM   Clinical Narrative:    Pt to discharge back to [facility] for LTC. Pt and pt daughter Duayne agreeable to the DC plan. Pt will transport via [facility]. [facility] called at 1526. DC packet placed at RN station. RN given number to call report at [phone] for room 114A. Pt will take Bipap mask back to facility, from hospital. There are no further ICM needs at this time.     Final next level of care: Long Term Nursing Home Barriers to Discharge: Barriers Resolved   Patient Goals and CMS Choice Patient states their goals for this hospitalization and ongoing recovery are:: To return to [facility].gov Compare Post Acute Care list provided to:: Patient Choice offered to / list presented to : Patient [place] ownership interest in [facility].provided to:: Patient    Discharge Placement                Patient to be transferred to facility by: [facility] Name of family member notified: Turner(POA),[name]  [phone] Patient and family notified of of transfer: 02/02/24  Discharge Plan and Services Additional resources added to the After Visit Summary for                  DME Arranged: N/A DME Agency: NA       HH Arranged: NA HH Agency: NA        Social Drivers of Health (SDOH) Interventions SDOH Screenings   Food Insecurity: No Food Insecurity (02/02/2024)  Housing: Low Risk  (02/02/2024)  Transportation Needs: No Transportation Needs (02/02/2024)  Utilities: Not At Risk (02/02/2024)  Social Connections: Patient Unable To Answer (02/02/2024)  Tobacco Use: Medium Risk (02/01/2024)     Readmission Risk Interventions    01/25/2024    3:44 PM 04/28/2023   11:56 AM  Readmission Risk Prevention Plan   Transportation Screening Complete Complete  PCP or Specialist Appt within 5-7 Days  Complete  PCP or Specialist Appt within 3-5 Days Complete   Home Care Screening  Complete  Medication Review (RN CM)  Complete  HRI or Home Care Consult Complete   Social Work Consult for Recovery Care Planning/Counseling Complete   Palliative Care Screening Not Applicable   Medication Review [employer]) Complete

## 2024-02-02 NOTE — Care Management CC44 (Signed)
 Condition Code 44 Documentation Completed  Patient Details  Name: KELLEEN STOLZE MRN: [id number] Date of Birth: 13-Nov-1940   Condition Code 44 given:  Yes Patient signature on Condition Code 44 notice:  Yes Documentation of 2 MD's agreement:  Yes Code 44 added to claim:  Yes    Jon ONEIDA Anon, RN 02/02/2024, 3:11 PM

## 2024-02-02 NOTE — Plan of Care (Signed)
  Problem: Education: Goal: Ability to describe self-care measures that may prevent or decrease complications (Diabetes Survival Skills Education) will improve Outcome: Progressing   Problem: Coping: Goal: Ability to adjust to condition or change in health will improve Outcome: Progressing   Problem: Fluid Volume: Goal: Ability to maintain a balanced intake and output will improve Outcome: Progressing   Problem: Health Behavior/Discharge Planning: Goal: Ability to identify and utilize available resources and services will improve Outcome: Progressing Goal: Ability to manage health-related needs will improve Outcome: Progressing   Problem: Metabolic: Goal: Ability to maintain appropriate glucose levels will improve Outcome: Progressing   Problem: Skin Integrity: Goal: Risk for impaired skin integrity will decrease Outcome: Progressing   Problem: Tissue Perfusion: Goal: Adequacy of tissue perfusion will improve Outcome: Progressing   Problem: Education: Goal: Knowledge of General Education information will improve Description: Including pain rating scale, medication(s)/side effects and non-pharmacologic comfort measures Outcome: Progressing   Problem: Health Behavior/Discharge Planning: Goal: Ability to manage health-related needs will improve Outcome: Progressing   Problem: Clinical Measurements: Goal: Ability to maintain clinical measurements within normal limits will improve Outcome: Progressing Goal: Will remain free from infection Outcome: Progressing Goal: Diagnostic test results will improve Outcome: Progressing Goal: Respiratory complications will improve Outcome: Progressing Goal: Cardiovascular complication will be avoided Outcome: Progressing   Problem: Activity: Goal: Risk for activity intolerance will decrease Outcome: Progressing   Problem: Nutrition: Goal: Adequate nutrition will be maintained Outcome: Progressing   Problem: Coping: Goal: Level  of anxiety will decrease Outcome: Progressing   Problem: Elimination: Goal: Will not experience complications related to bowel motility Outcome: Progressing Goal: Will not experience complications related to urinary retention Outcome: Progressing   Problem: Pain Managment: Goal: General experience of comfort will improve and/or be controlled Outcome: Progressing   Problem: Safety: Goal: Ability to remain free from injury will improve Outcome: Progressing   Problem: Skin Integrity: Goal: Risk for impaired skin integrity will decrease Outcome: Progressing   Problem: Cardiac: Goal: Ability to achieve and maintain adequate cardiopulmonary perfusion will improve Outcome: Progressing   Problem: Education: Goal: Ability to demonstrate management of disease process will improve Outcome: Not Progressing Goal: Ability to verbalize understanding of medication therapies will improve Outcome: Not Progressing   Elizabeth [name] BSN, RN, CCRP, CCRN 02/02/2024 4:06 AM

## 2024-02-02 NOTE — Plan of Care (Signed)
  Problem: Education: Goal: Ability to describe self-care measures that may prevent or decrease complications (Diabetes Survival Skills Education) will improve Outcome: Adequate for Discharge Goal: Individualized Educational Video(s) Outcome: Adequate for Discharge   Problem: Coping: Goal: Ability to adjust to condition or change in health will improve Outcome: Adequate for Discharge   Problem: Fluid Volume: Goal: Ability to maintain a balanced intake and output will improve Outcome: Adequate for Discharge   Problem: Health Behavior/Discharge Planning: Goal: Ability to identify and utilize available resources and services will improve Outcome: Adequate for Discharge Goal: Ability to manage health-related needs will improve Outcome: Adequate for Discharge   Problem: Metabolic: Goal: Ability to maintain appropriate glucose levels will improve Outcome: Adequate for Discharge   Problem: Nutritional: Goal: Maintenance of adequate nutrition will improve Outcome: Adequate for Discharge Goal: Progress toward achieving an optimal weight will improve Outcome: Adequate for Discharge   Problem: Skin Integrity: Goal: Risk for impaired skin integrity will decrease Outcome: Adequate for Discharge   Problem: Tissue Perfusion: Goal: Adequacy of tissue perfusion will improve Outcome: Adequate for Discharge   Problem: Education: Goal: Knowledge of General Education information will improve Description: Including pain rating scale, medication(s)/side effects and non-pharmacologic comfort measures Outcome: Adequate for Discharge   Problem: Health Behavior/Discharge Planning: Goal: Ability to manage health-related needs will improve Outcome: Adequate for Discharge   Problem: Clinical Measurements: Goal: Ability to maintain clinical measurements within normal limits will improve Outcome: Adequate for Discharge Goal: Will remain free from infection Outcome: Adequate for Discharge Goal:  Diagnostic test results will improve Outcome: Adequate for Discharge Goal: Respiratory complications will improve Outcome: Adequate for Discharge Goal: Cardiovascular complication will be avoided Outcome: Adequate for Discharge   Problem: Activity: Goal: Risk for activity intolerance will decrease Outcome: Adequate for Discharge   Problem: Nutrition: Goal: Adequate nutrition will be maintained Outcome: Adequate for Discharge   Problem: Coping: Goal: Level of anxiety will decrease Outcome: Adequate for Discharge   Problem: Elimination: Goal: Will not experience complications related to bowel motility Outcome: Adequate for Discharge Goal: Will not experience complications related to urinary retention Outcome: Adequate for Discharge   Problem: Pain Managment: Goal: General experience of comfort will improve and/or be controlled Outcome: Adequate for Discharge   Problem: Safety: Goal: Ability to remain free from injury will improve Outcome: Adequate for Discharge   Problem: Skin Integrity: Goal: Risk for impaired skin integrity will decrease Outcome: Adequate for Discharge   Problem: Education: Goal: Ability to demonstrate management of disease process will improve Outcome: Adequate for Discharge Goal: Ability to verbalize understanding of medication therapies will improve Outcome: Adequate for Discharge Goal: Individualized Educational Video(s) Outcome: Adequate for Discharge   Problem: Activity: Goal: Capacity to carry out activities will improve Outcome: Adequate for Discharge   Problem: Cardiac: Goal: Ability to achieve and maintain adequate cardiopulmonary perfusion will improve Outcome: Adequate for Discharge

## 2024-02-02 NOTE — NC FL2 (Signed)
   MEDICAID FL2 LEVEL OF CARE FORM     IDENTIFICATION  Patient Name: Elizabeth [name] Birthdate: 1940/08/24 Sex: female Admission Date (Current Location): 02/01/2024  [facility] and [place] Number:  Producer, television/film/video and Address:  [facility],  [address], [place] [address]      Provider Number: [id number]  Attending Physician Name and Address:  Barbarann Nest, MD  Relative Name and Phone Number:  Bramble(POA),[name]  [phone]    Current Level of Care: Hospital Recommended Level of Care: Nursing Facility Prior Approval Number:    Date Approved/Denied:   PASRR Number: [id number]  Discharge Plan: SNF    Current Diagnoses: Patient Active Problem List   Diagnosis Date Noted   Acute on chronic respiratory failure with hypercapnia (HCC) 02/02/2024   Acute respiratory failure with hypoxia and hypercarbia (HCC) 02/01/2024   Normocytic anemia 02/01/2024   Acute hypercapnic respiratory failure (HCC) 01/23/2024   Obesity hypoventilation syndrome (HCC) 01/23/2024   Acute respiratory failure with hypoxia and hypercapnia (HCC) 12/05/2023   Pressure injury of skin 12/05/2023   SIRS (systemic inflammatory response syndrome) (HCC) 12/05/2023   Renal insufficiency 12/05/2023   Macrocytic anemia 12/05/2023   Paroxysmal atrial fibrillation (HCC) 12/05/2023   Memory impairment 12/05/2023   Acute respiratory failure with hypercapnia (HCC) 09/17/2023   Hypomagnesemia 09/15/2023   Hypophosphatemia 09/15/2023   Acute metabolic encephalopathy 09/14/2023   Reactive airway disease with acute exacerbation 09/14/2023   Acute on chronic diastolic CHF (congestive heart failure) (HCC) 09/14/2023   Colitis 04/24/2023   Sepsis (HCC) 04/23/2023   (HFpEF) heart failure with preserved ejection fraction (HCC) 12/24/2022   Morbid obesity (HCC) 12/24/2022   OSA (obstructive sleep apnea) 03/28/2022   Diabetes mellitus without complication (HCC) 06/19/2021    Acute kidney injury 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 05/12/2021   COVID-19 virus infection 05/12/2021   Generalized weakness 05/12/2021   Type 2 diabetes mellitus without complication, without long-term current use of insulin  (HCC) 05/12/2021   Essential hypertension 05/12/2021   Asthma 05/12/2021   GERD (gastroesophageal reflux disease)    Leukopenia    Chronic venous insufficiency 01/17/2017   Pain of left breast 01/17/2012    Orientation RESPIRATION BLADDER Height & Weight     Self, Time, Situation, Place  O2 (3L O2 [place], Bipap at bedtime) Incontinent Weight: 109 kg Height:  5' 2 (157.5 cm)  BEHAVIORAL SYMPTOMS/MOOD NEUROLOGICAL BOWEL NUTRITION STATUS      Incontinent Diet (Heart Healthy, Thin Liquids)  AMBULATORY STATUS COMMUNICATION OF NEEDS Skin   Extensive Assist Verbally Other (Comment) (Pressure Injury Sacrum Medial Stage 2)                       Personal Care Assistance Level of Assistance  Bathing, Feeding, Dressing Bathing Assistance: Maximum assistance Feeding assistance: Limited assistance Dressing Assistance: Maximum assistance     Functional Limitations Info  Sight, Hearing, Speech Sight Info: Adequate Hearing Info: Adequate Speech Info: Adequate    SPECIAL CARE FACTORS FREQUENCY                       Contractures Contractures Info: Not present    Additional Factors Info  Code Status, Allergies Code Status Info: DNR Allergies Info: No Known Allergies           Current Medications (02/02/2024):  This is the current hospital active medication list Current Facility-Administered Medications  Medication Dose Route Frequency Provider Last Rate Last Admin  0.9 %  sodium chloride  infusion  250 mL Intravenous PRN Sheehan, Theresa C, MD       acetaminophen  (TYLENOL ) tablet 650 mg  650 mg Oral Q6H PRN Jackee Zebedee BROCKS, MD       Or   acetaminophen  (TYLENOL ) suppository 650 mg  650 mg Rectal Q6H PRN Jackee Zebedee BROCKS, MD        albuterol  (PROVENTIL ) (2.5 MG/3ML) 0.083% nebulizer solution 2.5 mg  2.5 mg Nebulization Q6H Jackee Zebedee BROCKS, MD   2.5 mg at 02/02/24 0830   amLODipine  (NORVASC ) tablet 10 mg  10 mg Oral Daily Sheehan, Theresa C, MD   10 mg at 02/02/24 9264   apixaban  (ELIQUIS ) tablet 5 mg  5 mg Oral BID Jackee Zebedee BROCKS, MD   5 mg at 02/02/24 9060   artificial tears ophthalmic solution 1 drop  1 drop Both Eyes Q6H PRN Jackee Zebedee BROCKS, MD       Chlorhexidine  Gluconate Cloth 2 % PADS 6 each  6 each Topical Daily Jackee Zebedee BROCKS, MD   6 each at 02/01/24 2145   donepezil  (ARICEPT ) tablet 5 mg  5 mg Oral q AM Sheehan, Theresa C, MD   5 mg at 02/02/24 0940   fluticasone  (FLONASE ) 50 MCG/ACT nasal spray 1 spray  1 spray Each Nare BID Jackee Zebedee BROCKS, MD   1 spray at 02/02/24 9052   fluticasone  furoate-vilanterol (BREO ELLIPTA ) 200-25 MCG/ACT 1 puff  1 puff Inhalation Daily Jackee Zebedee BROCKS, MD   1 puff at 02/02/24 9166   furosemide  (LASIX ) injection 40 mg  40 mg Intravenous Daily Jackee Zebedee BROCKS, MD   40 mg at 02/02/24 0736   hydrALAZINE  (APRESOLINE ) tablet 50 mg  50 mg Oral Q6H Sheehan, Theresa C, MD   50 mg at 02/02/24 1154   insulin  aspart (novoLOG ) injection 0-20 Units  0-20 Units Subcutaneous TID WC Jackee Zebedee BROCKS, MD   4 Units at 02/02/24 1154   insulin  aspart (novoLOG ) injection 0-5 Units  0-5 Units Subcutaneous QHS Jackee Zebedee BROCKS, MD       ketorolac (TORADOL) 15 MG/ML injection 15 mg  15 mg Intravenous Q6H PRN Sheehan, Theresa C, MD   15 mg at 02/02/24 9262   morphine  (PF) 2 MG/ML injection 2 mg  2 mg Intravenous Q2H PRN Sheehan, Theresa C, MD       ondansetron  (ZOFRAN ) tablet 4 mg  4 mg Oral Q6H PRN Sheehan, Theresa C, MD       Or   ondansetron  (ZOFRAN ) injection 4 mg  4 mg Intravenous Q6H PRN Sheehan, Theresa C, MD       Oral care mouth rinse  15 mL Mouth Rinse 4 times per day Jackee Zebedee BROCKS, MD   15 mL at 02/02/24 1154   Oral care mouth rinse  15 mL Mouth Rinse PRN Jackee Zebedee BROCKS, MD       oxybutynin  (DITROPAN -XL) 24 hr tablet 10 mg  10 mg Oral Daily Sheehan, Theresa C, MD   10 mg at 02/02/24 9060   pantoprazole  (PROTONIX ) EC tablet 40 mg  40 mg Oral Daily Sheehan, Theresa C, MD   40 mg at 02/02/24 9060   rosuvastatin  (CRESTOR ) tablet 5 mg  5 mg Oral QHS Jackee Zebedee BROCKS, MD   5 mg at 02/01/24 2203   senna (SENOKOT) tablet 17.2 mg  17.2 mg Oral Daily Jackee Zebedee BROCKS, MD       sertraline  (ZOLOFT ) tablet 12.5 mg  12.5 mg Oral  Daily Jackee Zebedee BROCKS, MD   12.5 mg at 02/02/24 9061   sodium chloride  flush (NS) 0.9 % injection 3 mL  3 mL Intravenous Q12H Jackee Zebedee BROCKS, MD   3 mL at 02/02/24 9088   sodium chloride  flush (NS) 0.9 % injection 3 mL  3 mL Intravenous Q12H Jackee Zebedee BROCKS, MD   3 mL at 02/02/24 9088   sodium chloride  flush (NS) 0.9 % injection 3 mL  3 mL Intravenous PRN Sheehan, Theresa C, MD       traZODone (DESYREL) tablet 25 mg  25 mg Oral QHS PRN Sheehan, Theresa C, MD         Discharge Medications: Please see discharge summary for a list of discharge medications.  Relevant Imaging Results:  Relevant Lab Results:   Additional Information SSN: [id number]  Jon ONEIDA Anon, RN

## 2024-02-02 NOTE — Progress Notes (Signed)
 Report called Danny in admissions at [facility]. Danny aware that bipap mask is being sent in patient belonging bag and will arrive with the patient. Updated that the patient needs to use the bipap mask.

## 2024-02-02 NOTE — Hospital Course (Addendum)
 83yo with h/o T2DM, asthma, HTN, chronic HFpEF, and chronic hypercarbic respiratory failure onBIPAP who presented with wheezing and generalized weakness.  She was previously hospitalized from 10/2-6 for acute on chronic hypercarbic respiratory failure after not wearing CPAP at her facility.  Pulmonology consulted and she was discharged on auto-BIPAP and O2.  On return, she was again found to have hypercarbia with pCO2 96.  BIPAP resumed with normalization of VBG.  Now asymptomatic.

## 2024-02-03 DIAGNOSIS — E119 Type 2 diabetes mellitus without complications: Secondary | ICD-10-CM | POA: Diagnosis not present

## 2024-02-03 DIAGNOSIS — J45909 Unspecified asthma, uncomplicated: Secondary | ICD-10-CM | POA: Diagnosis not present

## 2024-02-03 DIAGNOSIS — R413 Other amnesia: Secondary | ICD-10-CM | POA: Diagnosis not present

## 2024-02-03 DIAGNOSIS — G4733 Obstructive sleep apnea (adult) (pediatric): Secondary | ICD-10-CM | POA: Diagnosis not present

## 2024-02-03 DIAGNOSIS — J9610 Chronic respiratory failure, unspecified whether with hypoxia or hypercapnia: Secondary | ICD-10-CM | POA: Diagnosis not present

## 2024-02-03 DIAGNOSIS — K5900 Constipation, unspecified: Secondary | ICD-10-CM | POA: Diagnosis not present

## 2024-02-03 DIAGNOSIS — K219 Gastro-esophageal reflux disease without esophagitis: Secondary | ICD-10-CM | POA: Diagnosis not present

## 2024-02-03 DIAGNOSIS — Z9989 Dependence on other enabling machines and devices: Secondary | ICD-10-CM | POA: Diagnosis not present

## 2024-02-03 DIAGNOSIS — I10 Essential (primary) hypertension: Secondary | ICD-10-CM | POA: Diagnosis not present

## 2024-02-03 DIAGNOSIS — I2510 Atherosclerotic heart disease of native coronary artery without angina pectoris: Secondary | ICD-10-CM | POA: Diagnosis not present

## 2024-02-03 DIAGNOSIS — I5032 Chronic diastolic (congestive) heart failure: Secondary | ICD-10-CM | POA: Diagnosis not present

## 2024-02-05 DIAGNOSIS — I5030 Unspecified diastolic (congestive) heart failure: Secondary | ICD-10-CM | POA: Diagnosis not present

## 2024-02-05 DIAGNOSIS — J9612 Chronic respiratory failure with hypercapnia: Secondary | ICD-10-CM | POA: Diagnosis not present

## 2024-02-05 DIAGNOSIS — G4733 Obstructive sleep apnea (adult) (pediatric): Secondary | ICD-10-CM | POA: Diagnosis not present

## 2024-02-09 DIAGNOSIS — J9610 Chronic respiratory failure, unspecified whether with hypoxia or hypercapnia: Secondary | ICD-10-CM | POA: Diagnosis not present

## 2024-02-09 DIAGNOSIS — G4733 Obstructive sleep apnea (adult) (pediatric): Secondary | ICD-10-CM | POA: Diagnosis not present

## 2024-02-09 DIAGNOSIS — R413 Other amnesia: Secondary | ICD-10-CM | POA: Diagnosis not present

## 2024-02-09 DIAGNOSIS — J45909 Unspecified asthma, uncomplicated: Secondary | ICD-10-CM | POA: Diagnosis not present

## 2024-02-09 DIAGNOSIS — K219 Gastro-esophageal reflux disease without esophagitis: Secondary | ICD-10-CM | POA: Diagnosis not present

## 2024-02-09 DIAGNOSIS — Z9989 Dependence on other enabling machines and devices: Secondary | ICD-10-CM | POA: Diagnosis not present

## 2024-02-09 DIAGNOSIS — K5900 Constipation, unspecified: Secondary | ICD-10-CM | POA: Diagnosis not present

## 2024-02-09 DIAGNOSIS — I110 Hypertensive heart disease with heart failure: Secondary | ICD-10-CM | POA: Diagnosis not present

## 2024-02-09 DIAGNOSIS — E119 Type 2 diabetes mellitus without complications: Secondary | ICD-10-CM | POA: Diagnosis not present

## 2024-02-09 DIAGNOSIS — I2510 Atherosclerotic heart disease of native coronary artery without angina pectoris: Secondary | ICD-10-CM | POA: Diagnosis not present

## 2024-02-09 DIAGNOSIS — I5032 Chronic diastolic (congestive) heart failure: Secondary | ICD-10-CM | POA: Diagnosis not present

## 2024-03-27 ENCOUNTER — Other Ambulatory Visit: Payer: Self-pay

## 2024-03-27 ENCOUNTER — Emergency Department (HOSPITAL_COMMUNITY)

## 2024-03-27 ENCOUNTER — Inpatient Hospital Stay (HOSPITAL_COMMUNITY)
Admission: EM | Admit: 2024-03-27 | Discharge: 2024-03-31 | DRG: 189 | Disposition: A | Discharge status: Skilled Nursing Facility | Source: Skilled Nursing Facility | Attending: Internal Medicine | Admitting: Internal Medicine

## 2024-03-27 ENCOUNTER — Encounter (HOSPITAL_COMMUNITY): Payer: Self-pay | Admitting: Emergency Medicine

## 2024-03-27 DIAGNOSIS — J9602 Acute respiratory failure with hypercapnia: Secondary | ICD-10-CM | POA: Diagnosis present

## 2024-03-27 DIAGNOSIS — Z6841 Body Mass Index (BMI) 40.0 and over, adult: Secondary | ICD-10-CM | POA: Diagnosis not present

## 2024-03-27 DIAGNOSIS — Z7901 Long term (current) use of anticoagulants: Secondary | ICD-10-CM | POA: Diagnosis not present

## 2024-03-27 DIAGNOSIS — K219 Gastro-esophageal reflux disease without esophagitis: Secondary | ICD-10-CM | POA: Diagnosis present

## 2024-03-27 DIAGNOSIS — Z66 Do not resuscitate: Secondary | ICD-10-CM | POA: Diagnosis present

## 2024-03-27 DIAGNOSIS — J9622 Acute and chronic respiratory failure with hypercapnia: Secondary | ICD-10-CM | POA: Diagnosis present

## 2024-03-27 DIAGNOSIS — I5033 Acute on chronic diastolic (congestive) heart failure: Secondary | ICD-10-CM | POA: Diagnosis present

## 2024-03-27 DIAGNOSIS — E785 Hyperlipidemia, unspecified: Secondary | ICD-10-CM | POA: Diagnosis present

## 2024-03-27 DIAGNOSIS — E662 Morbid (severe) obesity with alveolar hypoventilation: Secondary | ICD-10-CM | POA: Diagnosis present

## 2024-03-27 DIAGNOSIS — K922 Gastrointestinal hemorrhage, unspecified: Secondary | ICD-10-CM | POA: Diagnosis present

## 2024-03-27 DIAGNOSIS — Z7951 Long term (current) use of inhaled steroids: Secondary | ICD-10-CM | POA: Diagnosis not present

## 2024-03-27 DIAGNOSIS — D539 Nutritional anemia, unspecified: Secondary | ICD-10-CM | POA: Diagnosis present

## 2024-03-27 DIAGNOSIS — Z79899 Other long term (current) drug therapy: Secondary | ICD-10-CM | POA: Diagnosis not present

## 2024-03-27 DIAGNOSIS — Z87891 Personal history of nicotine dependence: Secondary | ICD-10-CM | POA: Diagnosis not present

## 2024-03-27 DIAGNOSIS — J45909 Unspecified asthma, uncomplicated: Secondary | ICD-10-CM | POA: Diagnosis present

## 2024-03-27 DIAGNOSIS — J9621 Acute and chronic respiratory failure with hypoxia: Secondary | ICD-10-CM | POA: Diagnosis present

## 2024-03-27 DIAGNOSIS — I480 Paroxysmal atrial fibrillation: Secondary | ICD-10-CM | POA: Diagnosis present

## 2024-03-27 DIAGNOSIS — Z8249 Family history of ischemic heart disease and other diseases of the circulatory system: Secondary | ICD-10-CM | POA: Diagnosis not present

## 2024-03-27 DIAGNOSIS — Z833 Family history of diabetes mellitus: Secondary | ICD-10-CM | POA: Diagnosis not present

## 2024-03-27 DIAGNOSIS — Z91199 Patient's noncompliance with other medical treatment and regimen due to unspecified reason: Secondary | ICD-10-CM | POA: Diagnosis not present

## 2024-03-27 DIAGNOSIS — L89152 Pressure ulcer of sacral region, stage 2: Secondary | ICD-10-CM | POA: Diagnosis present

## 2024-03-27 DIAGNOSIS — E119 Type 2 diabetes mellitus without complications: Secondary | ICD-10-CM

## 2024-03-27 DIAGNOSIS — I152 Hypertension secondary to endocrine disorders: Secondary | ICD-10-CM | POA: Diagnosis present

## 2024-03-27 DIAGNOSIS — G4733 Obstructive sleep apnea (adult) (pediatric): Secondary | ICD-10-CM | POA: Diagnosis present

## 2024-03-27 DIAGNOSIS — I959 Hypotension, unspecified: Secondary | ICD-10-CM | POA: Diagnosis present

## 2024-03-27 DIAGNOSIS — F39 Unspecified mood [affective] disorder: Secondary | ICD-10-CM | POA: Diagnosis present

## 2024-03-27 DIAGNOSIS — E1159 Type 2 diabetes mellitus with other circulatory complications: Secondary | ICD-10-CM | POA: Diagnosis present

## 2024-03-27 DIAGNOSIS — I509 Heart failure, unspecified: Principal | ICD-10-CM

## 2024-03-27 LAB — URINALYSIS, ROUTINE W REFLEX MICROSCOPIC
Bacteria, UA: NONE SEEN
Bilirubin Urine: NEGATIVE
Glucose, UA: NEGATIVE mg/dL
Hgb urine dipstick: NEGATIVE
Ketones, ur: NEGATIVE mg/dL
Nitrite: NEGATIVE
Protein, ur: NEGATIVE mg/dL
Specific Gravity, Urine: 1.008 (ref 1.005–1.030)
pH: 5 (ref 5.0–8.0)

## 2024-03-27 LAB — PRO BRAIN NATRIURETIC PEPTIDE: Pro Brain Natriuretic Peptide: 2862 pg/mL — ABNORMAL HIGH (ref <300.0)

## 2024-03-27 LAB — COMPREHENSIVE METABOLIC PANEL WITH GFR
ALT: 8 U/L (ref 0–44)
AST: <10 U/L — ABNORMAL LOW (ref 15–41)
Albumin: 3.7 g/dL (ref 3.5–5.0)
Alkaline Phosphatase: 48 U/L (ref 38–126)
Anion gap: 4 — ABNORMAL LOW (ref 5–15)
BUN: 31 mg/dL — ABNORMAL HIGH (ref 8–23)
CO2: 38 mmol/L — ABNORMAL HIGH (ref 22–32)
Calcium: 9.6 mg/dL (ref 8.9–10.3)
Chloride: 96 mmol/L — ABNORMAL LOW (ref 98–111)
Creatinine, Ser: 1 mg/dL (ref 0.44–1.00)
GFR, Estimated: 56 mL/min — ABNORMAL LOW (ref >60)
Glucose, Bld: 90 mg/dL (ref 70–99)
Potassium: 5.1 mmol/L (ref 3.5–5.1)
Sodium: 138 mmol/L (ref 135–145)
Total Bilirubin: 0.2 mg/dL (ref 0.0–1.2)
Total Protein: 6.6 g/dL (ref 6.5–8.1)

## 2024-03-27 LAB — BLOOD GAS, VENOUS
Acid-Base Excess: 7.5 mmol/L — ABNORMAL HIGH (ref 0.0–2.0)
Bicarbonate: 40.6 mmol/L — ABNORMAL HIGH (ref 20.0–28.0)
O2 Saturation: 97 %
Patient temperature: 37
pCO2, Ven: 114 mmHg (ref 44–60)
pH, Ven: 7.16 — CL (ref 7.25–7.43)
pO2, Ven: 76 mmHg — ABNORMAL HIGH (ref 32–45)

## 2024-03-27 LAB — AMMONIA: Ammonia: 38 umol/L — ABNORMAL HIGH (ref 9–35)

## 2024-03-27 LAB — CBC WITH DIFFERENTIAL/PLATELET
Abs Immature Granulocytes: 0.05 K/uL (ref 0.00–0.07)
Basophils Absolute: 0 K/uL (ref 0.0–0.1)
Basophils Relative: 0 %
Eosinophils Absolute: 0.2 K/uL (ref 0.0–0.5)
Eosinophils Relative: 4 %
HCT: 28.2 % — ABNORMAL LOW (ref 36.0–46.0)
Hemoglobin: 7.7 g/dL — ABNORMAL LOW (ref 12.0–15.0)
Immature Granulocytes: 1 %
Lymphocytes Relative: 37 %
Lymphs Abs: 2.1 K/uL (ref 0.7–4.0)
MCH: 29.4 pg (ref 26.0–34.0)
MCHC: 27.3 g/dL — ABNORMAL LOW (ref 30.0–36.0)
MCV: 107.6 fL — ABNORMAL HIGH (ref 80.0–100.0)
Monocytes Absolute: 0.8 K/uL (ref 0.1–1.0)
Monocytes Relative: 14 %
Neutro Abs: 2.4 K/uL (ref 1.7–7.7)
Neutrophils Relative %: 44 %
Platelets: 195 K/uL (ref 150–400)
RBC: 2.62 MIL/uL — ABNORMAL LOW (ref 3.87–5.11)
RDW: 16.2 % — ABNORMAL HIGH (ref 11.5–15.5)
WBC: 5.6 K/uL (ref 4.0–10.5)
nRBC: 0 % (ref 0.0–0.2)

## 2024-03-27 LAB — BLOOD GAS, ARTERIAL
Acid-Base Excess: 11.1 mmol/L — ABNORMAL HIGH (ref 0.0–2.0)
Bicarbonate: 40.2 mmol/L — ABNORMAL HIGH (ref 20.0–28.0)
Drawn by: 21338
Expiratory PAP: 8 cmH2O
FIO2: 35 %
Inspiratory PAP: 20 cmH2O
Mode: POSITIVE
O2 Saturation: 98.6 %
Patient temperature: 37
RATE: 16 {breaths}/min
pCO2 arterial: 96 mmHg (ref 32–48)
pH, Arterial: 7.23 — ABNORMAL LOW (ref 7.35–7.45)
pO2, Arterial: 78 mmHg — ABNORMAL LOW (ref 83–108)

## 2024-03-27 LAB — TROPONIN T, HIGH SENSITIVITY
Troponin T High Sensitivity: 26 ng/L — ABNORMAL HIGH (ref 0–19)
Troponin T High Sensitivity: 25 ng/L — ABNORMAL HIGH (ref 0–19)

## 2024-03-27 LAB — GLUCOSE, CAPILLARY
Glucose-Capillary: 115 mg/dL — ABNORMAL HIGH (ref 70–99)
Glucose-Capillary: 69 mg/dL — ABNORMAL LOW (ref 70–99)
Glucose-Capillary: 86 mg/dL (ref 70–99)

## 2024-03-27 LAB — MRSA NEXT GEN BY PCR, NASAL: MRSA by PCR Next Gen: NOT DETECTED

## 2024-03-27 LAB — CBG MONITORING, ED: Glucose-Capillary: 89 mg/dL (ref 70–99)

## 2024-03-27 MED ORDER — ORAL CARE MOUTH RINSE: 15.0000 mL | OROMUCOSAL | Status: DC | PRN

## 2024-03-27 MED ORDER — DONEPEZIL HCL 10 MG PO TABS
5.0000 mg | ORAL_TABLET | Freq: Every morning | ORAL | Status: DC
  Administered 2024-03-28 – 2024-03-30 (×3): 5 mg via ORAL
  Filled 2024-03-27 (×3): qty 1

## 2024-03-27 MED ORDER — DEXTROSE 50 % IV SOLN
INTRAVENOUS | Status: AC
  Administered 2024-03-27: 12.5 g via INTRAVENOUS
  Filled 2024-03-27: qty 50

## 2024-03-27 MED ORDER — MELATONIN 3 MG PO TABS
6.0000 mg | ORAL_TABLET | Freq: Every day | ORAL | Status: DC
  Administered 2024-03-27 – 2024-03-30 (×4): 6 mg via ORAL
  Filled 2024-03-27 (×4): qty 2

## 2024-03-27 MED ORDER — DICLOFENAC SODIUM 1 % EX GEL
4.0000 g | Freq: Four times a day (QID) | CUTANEOUS | Status: DC
  Administered 2024-03-28 – 2024-03-31 (×12): 4 g via TOPICAL
  Filled 2024-03-27: qty 100

## 2024-03-27 MED ORDER — ONDANSETRON HCL 4 MG PO TABS: 4.0000 mg | ORAL_TABLET | Freq: Four times a day (QID) | ORAL | Status: DC | PRN

## 2024-03-27 MED ORDER — BISACODYL 5 MG PO TBEC
5.0000 mg | DELAYED_RELEASE_TABLET | Freq: Every day | ORAL | Status: DC | PRN
  Administered 2024-03-30: 5 mg via ORAL
  Filled 2024-03-27 (×2): qty 1

## 2024-03-27 MED ORDER — ACETAMINOPHEN 650 MG RE SUPP: 650.0000 mg | Freq: Four times a day (QID) | RECTAL | Status: DC | PRN

## 2024-03-27 MED ORDER — SODIUM CHLORIDE 0.9% FLUSH
3.0000 mL | Freq: Two times a day (BID) | INTRAVENOUS | Status: DC
  Administered 2024-03-27 – 2024-03-31 (×8): 3 mL via INTRAVENOUS

## 2024-03-27 MED ORDER — FUROSEMIDE 10 MG/ML IJ SOLN: 20.0000 mg | Freq: Every day | INTRAMUSCULAR | Status: DC

## 2024-03-27 MED ORDER — ALBUTEROL SULFATE (2.5 MG/3ML) 0.083% IN NEBU: 2.5000 mg | INHALATION_SOLUTION | RESPIRATORY_TRACT | Status: DC | PRN

## 2024-03-27 MED ORDER — ORAL CARE MOUTH RINSE
15.0000 mL | OROMUCOSAL | Status: DC
  Administered 2024-03-27 – 2024-03-28 (×3): 15 mL via OROMUCOSAL

## 2024-03-27 MED ORDER — ONDANSETRON HCL 4 MG/2ML IJ SOLN: 4.0000 mg | Freq: Four times a day (QID) | INTRAMUSCULAR | Status: DC | PRN

## 2024-03-27 MED ORDER — ACETAMINOPHEN 325 MG PO TABS
650.0000 mg | ORAL_TABLET | Freq: Four times a day (QID) | ORAL | Status: DC | PRN
  Administered 2024-03-30: 650 mg via ORAL
  Filled 2024-03-27: qty 2

## 2024-03-27 MED ORDER — PANTOPRAZOLE SODIUM 40 MG PO TBEC
40.0000 mg | DELAYED_RELEASE_TABLET | Freq: Every day | ORAL | Status: DC
  Administered 2024-03-28: 40 mg via ORAL
  Filled 2024-03-27: qty 1

## 2024-03-27 MED ORDER — CHLORHEXIDINE GLUCONATE CLOTH 2 % EX PADS
6.0000 | MEDICATED_PAD | Freq: Every day | CUTANEOUS | Status: DC
  Administered 2024-03-27 – 2024-03-30 (×4): 6 via TOPICAL

## 2024-03-27 MED ORDER — SENNA 8.6 MG PO TABS
17.2000 mg | ORAL_TABLET | Freq: Every day | ORAL | Status: DC
  Administered 2024-03-28 – 2024-03-31 (×3): 17.2 mg via ORAL
  Filled 2024-03-27 (×4): qty 2

## 2024-03-27 MED ORDER — FUROSEMIDE 10 MG/ML IJ SOLN
40.0000 mg | Freq: Once | INTRAMUSCULAR | Status: AC
  Administered 2024-03-27: 40 mg via INTRAVENOUS
  Filled 2024-03-27: qty 4

## 2024-03-27 MED ORDER — SERTRALINE HCL 25 MG PO TABS
12.5000 mg | ORAL_TABLET | Freq: Every day | ORAL | Status: DC
  Administered 2024-03-28 – 2024-03-31 (×4): 12.5 mg via ORAL
  Filled 2024-03-27 (×4): qty 0.5

## 2024-03-27 MED ORDER — APIXABAN 5 MG PO TABS
5.0000 mg | ORAL_TABLET | Freq: Two times a day (BID) | ORAL | Status: DC
  Administered 2024-03-27 – 2024-03-28 (×2): 5 mg via ORAL
  Filled 2024-03-27 (×2): qty 1

## 2024-03-27 MED ORDER — DEXTROSE 50 % IV SOLN: 12.5000 g | INTRAVENOUS | Status: AC

## 2024-03-27 NOTE — ED Triage Notes (Signed)
 Pt bib EMS from [facility] for SOB. 87% on 2L when fire arrived and placed on 4L to get 98%. EMS then placed on non rebreather due to work of breathing and mouth breathing. Decreased breath sounds on left side. Pt arrived on duo neb. Pt also c/o nausea, lethargy and facility stated pt has decreased speaking for last 3 days. 154/60 HR66 99% duoneb CBG 82 10 albu 25atrivent

## 2024-03-27 NOTE — Plan of Care (Signed)
 Admission assessment completed, patient hypoglycemic BG-69, protocol followed, recheck of blood glucose was 115, BiPAP on, all care plans initiated, bed in lowest locked position with side rails up. Bed alarm on and call bell in reach.  Problem: Education: Goal: Knowledge of General Education information will improve Description: Including pain rating scale, medication(s)/side effects and non-pharmacologic comfort measures Outcome: Progressing   Problem: Clinical Measurements: Goal: Ability to maintain clinical measurements within normal limits will improve Outcome: Progressing Goal: Will remain free from infection Outcome: Progressing Goal: Diagnostic test results will improve Outcome: Progressing Goal: Respiratory complications will improve Outcome: Progressing Goal: Cardiovascular complication will be avoided Outcome: Progressing   Problem: Activity: Goal: Risk for activity intolerance will decrease Outcome: Progressing   Problem: Pain Managment: Goal: General experience of comfort will improve and/or be controlled Outcome: Progressing   Problem: Safety: Goal: Ability to remain free from injury will improve Outcome: Progressing   Problem: Skin Integrity: Goal: Risk for impaired skin integrity will decrease Outcome: Progressing   Problem: Fluid Volume: Goal: Ability to maintain a balanced intake and output will improve Outcome: Progressing   Problem: Metabolic: Goal: Ability to maintain appropriate glucose levels will improve Outcome: Progressing   Problem: Nutritional: Goal: Maintenance of adequate nutrition will improve Outcome: Progressing   Problem: Tissue Perfusion: Goal: Adequacy of tissue perfusion will improve Outcome: Progressing   Problem: Cardiac: Goal: Ability to achieve and maintain adequate cardiopulmonary perfusion will improve Outcome: Progressing   Problem: Respiratory: Goal: Ability to maintain a clear airway will improve Outcome:  Progressing Goal: Ability to maintain adequate ventilation will improve Outcome: Progressing

## 2024-03-27 NOTE — Progress Notes (Signed)
 Pt placed on bipap per MD order.  Pt placed on 20/8 for adequate tidal volume. RR 16, 35%.  HR 60, RR 19 total, Sats 98%, BP 128/47.  PT is tolerating at this time. Pt does wake up and arouse by voice. Will leave door open and update RN.

## 2024-03-27 NOTE — H&P (Signed)
 History and Physical    Patient: Elizabeth [name] FMW:[id number] DOB: [birth year] DOA: 03/27/2024 DOS: the patient was seen and examined on 03/27/2024 PCP: Albina GORMAN Dine, MD  Patient coming from: SNF  Chief Complaint:  Chief Complaint  Patient presents with   Shortness of Breath   Fatigue   HPI: Elizabeth [name] is a 83 y.o. female with medical history significant for chronic respiratory failure on nightly BiPAP, type 2 diabetes mellitus, obesity, obesity hypoventilation syndrome, CHF and asthma.  The patient finds the BiPAP uncomfortable so frequently does not wear it.  She was hospitalized 10/2-6 and 10/12 -13 for hypercarbia because she refused to wear CPAP or BiPAP.  Today in the nursing home she was found to be more lethargic than usual and nauseated so EMS was called.  It appears that she had been speaking less for the last 3 days. It was reported to EMS the patient again has been refusing the BiPAP lately.  During her last hospital stay the patient's facemask was adjusted to make it more comfortable for her.  She did wear the BiPAP while hospitalized. My history comes from the ED provider.  The patient is on BiPAP and is not able to provide any history. In the emergency department the patient had a venous blood gas which revealed pH 7.16 pCO2 of 114.  The patient was placed on BiPAP at approximately 5:00 PM. Also of note her BNP was 2862.  Troponin was 26 then 25.  Her hemoglobin is 7.7 with an MCV of 107.6.  White blood cell count is normal.  Creatinine is 1.0. The patient did have a CT scan of her head because of the decreased level of responsiveness.  That did not show any evidence of acute stroke.  Her chest x-ray did reveal bilateral pulmonary edema with bilateral pleural effusions left greater than right. The hospitalist were called to admit this patient currently on BiPAP for congestive heart failure exacerbation and noncompliance with her outpatient BiPAP. The patient is an active  hospice patient.   Review of Systems: unable to review all systems due to the inability of the patient to answer questions. Past Medical History:  Diagnosis Date   (HFpEF) heart failure with preserved ejection fraction (HCC) 12/24/2022   Arthritis    Asthma    Breast mass in female    Bronchitis    Bronchitis    Constipation    Cough    GERD (gastroesophageal reflux disease)    Hypertension associated with diabetes (HCC)    Type 2 diabetes mellitus (HCC)    Wheezing    Past Surgical History:  Procedure Laterality Date   ABDOMINAL HYSTERECTOMY  1996   CYSTECTOMY  1980's   back of neck   THYROID  SURGERY  2001   Social History:  reports that she quit smoking about 60 years ago. Her smoking use included cigarettes. She has never used smokeless tobacco. She reports that she does not drink alcohol  and does not use drugs.  No Known Allergies  Family History  Problem Relation Age of Onset   Diabetes Mother    Heart disease Mother    Cancer Maternal Uncle     Prior to Admission medications   Medication Sig Start Date End Date Taking? Authorizing Provider  acetaminophen  (TYLENOL ) 500 MG tablet Take 500 mg by mouth 3 (three) times daily.    [provider]  albuterol  (PROVENTIL  HFA;VENTOLIN  HFA) 108 (90 BASE) MCG/ACT inhaler Inhale 2 puffs into the lungs every 6 (  six) hours as needed for shortness of breath.    [provider]  albuterol  (PROVENTIL ) (2.5 MG/3ML) 0.083% nebulizer solution Take 2.5 mg by nebulization every 6 (six) hours.    [provider]  amLODipine  (NORVASC ) 10 MG tablet Take 1 tablet (10 mg total) by mouth daily. 12/08/23   Dennise Lavada POUR, MD  apixaban  (ELIQUIS ) 5 MG TABS tablet Take 1 tablet (5 mg total) by mouth 2 (two) times daily. 04/28/23   Amin, Ankit C, MD  bisacodyl  (DULCOLAX) 5 MG EC tablet Take 2 tablets (10 mg total) by mouth daily as needed for moderate constipation or severe constipation. 04/28/23   Amin, Ankit C, MD   Cetirizine  HCl 10 MG CAPS Take 10 mg by mouth daily.    [provider]  cyanocobalamin  (VITAMIN B12) 1000 MCG tablet Take 1,000 mcg by mouth daily.    [provider]  diclofenac  Sodium (VOLTAREN ) 1 % GEL Apply 4 g topically 4 (four) times daily. Apply to bilateral knees 04/24/21   [provider]  donepezil  (ARICEPT ) 5 MG tablet Take 1 tablet (5 mg total) by mouth daily. Patient taking differently: Take 5 mg by mouth in the morning. 07/30/23   Wertman, Sara E, PA-C  fluticasone  (FLONASE ) 50 MCG/ACT nasal spray PLACE 1 SPRAY INTO BOTH NOSTRILS 2 (TWO) TIMES DAILY 09/13/22   Shellia Oh, MD  fluticasone -salmeterol (ADVAIR) 250-50 MCG/ACT AEPB Inhale 1 puff into the lungs in the morning and at bedtime.    [provider]  furosemide  (LASIX ) 40 MG tablet Take 1 tablet (40 mg total) by mouth daily as needed. Weigh yourself daily.  Take one dose by mouth if your weight increases by more than 2 pounds in one day or 5 pounds in one week. 09/18/23 09/17/24  Shalhoub, Zachary PARAS, MD  hydrALAZINE  (APRESOLINE ) 50 MG tablet Take 50 mg by mouth every 6 (six) hours. 04/10/23   [provider]  losartan  (COZAAR ) 50 MG tablet Take 1 tablet (50 mg total) by mouth daily. 12/08/23   Singh, Prashant K, MD  melatonin 3 MG TABS tablet Take 6 mg by mouth at bedtime.    [provider]  Multiple Vitamins-Minerals (CENTRUM SILVER PO) Take 1 tablet by mouth daily.    [provider]  oxybutynin  (DITROPAN -XL) 10 MG 24 hr tablet Take 10 mg by mouth daily. 12/09/22   [provider]  OXYGEN  Inhale 2-3 L/min into the lungs See admin instructions. When using CPAP and night, bleeding O2 3L. Oxygen  via nasal cannula at 2 L/min as needed for shortness of breath or sats < 90%.    [provider]  pantoprazole  (PROTONIX ) 40 MG tablet Take 40 mg by mouth daily.    [provider]  polyethylene glycol (MIRALAX  / GLYCOLAX ) 17 g packet Take 17 g by mouth  daily as needed for mild constipation.    [provider]  potassium chloride  (KLOR-CON ) 10 MEQ tablet Take 10 mEq by mouth daily.    [provider]  Propylene Glycol (SYSTANE COMPLETE) 0.6 % SOLN Place 1 drop into both eyes every 6 (six) hours as needed (dry eyes).    [provider]  rosuvastatin  (CRESTOR ) 5 MG tablet Take 5 mg by mouth at bedtime. 12/09/22   [provider]  senna (SENOKOT) 8.6 MG tablet Take 17.2 mg by mouth daily.    [provider]  sertraline  (ZOLOFT ) 25 MG tablet Take 12.5 mg by mouth daily.    [provider]  sodium phosphate  (FLEET) ENEM Place 1 enema rectally once as needed (constipation).    [provider]    Physical Exam: Vitals:   03/27/24 1615 03/27/24 1630 03/27/24 1645 03/27/24 1701  BP: (!) 118/35 (!) 132/30 (!) 109/47 (!) 128/47  Pulse: 63 64 61 60  Resp: 18 17 16 19   Temp:      TempSrc:      SpO2: 100% 100% 99% 98%  Weight:      Height:       Physical Exam: My butt is sore, she says  General: obese, on bipap, awake, talking through the mask HEENT: Normocephalic, atraumatic, PERRL Cardiovascular: Normal rate and rhythm. Distal pulses intact.Systolic murmur, No JVD  Pulmonary: Normal pulmonary effort, normal breath sounds Gastrointestinal: Nondistended abdomen, soft, non-tender, normoactive bowel sounds Musculoskeletal:No lower ext edema Lymphadenopathy: No cervical LAD.  Skin: Skin is warm and dry. Neuro:  Awake and interactive, strength exam deferred because she is on BiPAP. PSYCH: Attentive and cooperative  Data Reviewed:  Results for orders placed or performed during the hospital encounter of 03/27/24 (from the past 24 hours)  Ammonia     Status: Abnormal   Collection Time: 03/27/24  3:25 PM  Result Value Ref Range   Ammonia 38 (H) 9 - 35 umol/L  Blood gas, venous (at [facility] and [facility])     Status: Abnormal   Collection Time: 03/27/24  3:25 PM  Result Value Ref Range   pH,  Ven 7.16 (LL) 7.25 - 7.43   pCO2, Ven 114 (HH) 44 - 60 mmHg   pO2, Ven 76 (H) 32 - 45 mmHg   Bicarbonate 40.6 (H) 20.0 - 28.0 mmol/L   Acid-Base Excess 7.5 (H) 0.0 - 2.0 mmol/L   O2 Saturation 97 %   Patient temperature 37.0   Comprehensive metabolic panel     Status: Abnormal   Collection Time: 03/27/24  3:26 PM  Result Value Ref Range   Sodium 138 135 - 145 mmol/L   Potassium 5.1 3.5 - 5.1 mmol/L   Chloride 96 (L) 98 - 111 mmol/L   CO2 38 (H) 22 - 32 mmol/L   Glucose, Bld 90 70 - 99 mg/dL   BUN 31 (H) 8 - 23 mg/dL   Creatinine, Ser 8.99 0.44 - 1.00 mg/dL   Calcium  9.6 8.9 - 10.3 mg/dL   Total Protein 6.6 6.5 - 8.1 g/dL   Albumin 3.7 3.5 - 5.0 g/dL   AST <89 (L) 15 - 41 U/L   ALT 8 0 - 44 U/L   Alkaline Phosphatase 48 38 - 126 U/L   Total Bilirubin 0.2 0.0 - 1.2 mg/dL   GFR, Estimated 56 (L) >60 mL/min   Anion gap 4 (L) 5 - 15  CBC WITH DIFFERENTIAL     Status: Abnormal   Collection Time: 03/27/24  3:26 PM  Result Value Ref Range   WBC 5.6 4.0 - 10.5 K/uL   RBC 2.62 (L) 3.87 - 5.11 MIL/uL   Hemoglobin 7.7 (L) 12.0 - 15.0 g/dL   HCT 71.7 (L) 63.9 - 53.9 %   MCV 107.6 (H) 80.0 - 100.0 fL   MCH 29.4 26.0 - 34.0 pg   MCHC 27.3 (L) 30.0 - 36.0 g/dL   RDW 83.7 (H) 88.4 - 84.4 %   Platelets 195 150 - 400 K/uL   nRBC 0.0 0.0 - 0.2 %   Neutrophils Relative % 44 %   Neutro Abs 2.4 1.7 - 7.7 K/uL   Lymphocytes Relative 37 %  Lymphs Abs 2.1 0.7 - 4.0 K/uL   Monocytes Relative 14 %   Monocytes Absolute 0.8 0.1 - 1.0 K/uL   Eosinophils Relative 4 %   Eosinophils Absolute 0.2 0.0 - 0.5 K/uL   Basophils Relative 0 %   Basophils Absolute 0.0 0.0 - 0.1 K/uL   Immature Granulocytes 1 %   Abs Immature Granulocytes 0.05 0.00 - 0.07 K/uL  Pro Brain natriuretic peptide     Status: Abnormal   Collection Time: 03/27/24  3:26 PM  Result Value Ref Range   Pro Brain Natriuretic Peptide 2,862.0 (H) <300.0 pg/mL  Troponin T, High Sensitivity     Status: Abnormal   Collection Time:  03/27/24  3:26 PM  Result Value Ref Range   Troponin T High Sensitivity 26 (H) 0 - 19 ng/L  CBG monitoring, ED     Status: None   Collection Time: 03/27/24  4:37 PM  Result Value Ref Range   Glucose-Capillary 89 70 - 99 mg/dL     Assessment and Plan: Acute on chronic hypercapnic respiratory failure -patient currently on BiPAP after an ABG revealing pH 7.1.  She is clearly starting to do better and is more alert and talking. - She will be admitted to the stepdown unit. - I am not sure what the long-term solution for this is.  Maybe further discussion with her and hospice once she is off BiPAP  2.  CHF exacerbation -the patient is on 40 mg Lasix  p.o. daily but her chest x-ray definitely revealed evidence of volume overload.  - Continue IV diuresis  3.  Paroxysmal atrial fibrillation -continue Eliquis   4.  Hypertension -will continue her outpatient blood pressure regimen regimen  5.  Normocytic anemia -  Hemoglobin of 7.7 may be a little low given her chronic medical problems.  Consider transfusion  6.  Sacral Pressure injury suspected.  She did have an ulcer present 2 months ago when she was hospitalized and is complaining of soreness in her bottom.   Will examine once more stable.     Advance Care Planning:   Code Status: Prior  She has a MOST form in the chart which says she is DNR.   Consults: None  Family Communication: None  Severity of Illness: The appropriate patient status for this patient is INPATIENT. Inpatient status is judged to be reasonable and necessary in order to provide the required intensity of service to ensure the patient's safety. The patient's presenting symptoms, physical exam findings, and initial radiographic and laboratory data in the context of their chronic comorbidities is felt to place them at high risk for further clinical deterioration. Furthermore, it is not anticipated that the patient will be medically stable for discharge from the hospital  within 2 midnights of admission.   * I certify that at the point of admission it is my clinical judgment that the patient will require inpatient hospital care spanning beyond 2 midnights from the point of admission due to high intensity of service, high risk for further deterioration and high frequency of surveillance required.*  Author: ARTHEA CHILD, MD 03/27/2024 5:15 PM  For on call review [email or web address].

## 2024-03-27 NOTE — ED Provider Notes (Signed)
 [place] EMERGENCY DEPARTMENT AT [facility] Provider Note   CSN: [id number] Arrival date & time: 03/27/24  1433     Patient presents with: Shortness of Breath and Fatigue   Elizabeth [name] is a 83 y.o. female.    Shortness of Breath    Patient has a history of bronchitis acid reflux diabetes hypertension heart failure, respiratory acidosis.  Patient presents ED with altered mental status.  EMS reports noted that the nursing facility states patient has been more lethargic the last several days.  She has not been speaking as much she has been complaining of some nausea.  EMS noted that her oxygen  saturation was decreased at 87% on 2 L.  Patient also noted to have some increased work of breathing she was given a breathing treatment.  On arrival patient patient intermittently following commands but not answering questions verbally  Prior to Admission medications   Medication Sig Start Date End Date Taking? Authorizing Provider  acetaminophen  (TYLENOL ) 500 MG tablet Take 500 mg by mouth 3 (three) times daily.    [provider]  albuterol  (PROVENTIL  HFA;VENTOLIN  HFA) 108 (90 BASE) MCG/ACT inhaler Inhale 2 puffs into the lungs every 6 (six) hours as needed for shortness of breath.    [provider]  albuterol  (PROVENTIL ) (2.5 MG/3ML) 0.083% nebulizer solution Take 2.5 mg by nebulization every 6 (six) hours.    [provider]  amLODipine  (NORVASC ) 10 MG tablet Take 1 tablet (10 mg total) by mouth daily. 12/08/23   Dennise Lavada POUR, MD  apixaban  (ELIQUIS ) 5 MG TABS tablet Take 1 tablet (5 mg total) by mouth 2 (two) times daily. 04/28/23   Amin, Ankit C, MD  bisacodyl  (DULCOLAX) 5 MG EC tablet Take 2 tablets (10 mg total) by mouth daily as needed for moderate constipation or severe constipation. 04/28/23   Amin, Ankit C, MD  Cetirizine  HCl 10 MG CAPS Take 10 mg by mouth daily.    [provider]  cyanocobalamin  (VITAMIN B12) 1000 MCG tablet Take  1,000 mcg by mouth daily.    [provider]  diclofenac  Sodium (VOLTAREN ) 1 % GEL Apply 4 g topically 4 (four) times daily. Apply to bilateral knees 04/24/21   [provider]  donepezil  (ARICEPT ) 5 MG tablet Take 1 tablet (5 mg total) by mouth daily. Patient taking differently: Take 5 mg by mouth in the morning. 07/30/23   Wertman, Sara E, PA-C  fluticasone  (FLONASE ) 50 MCG/ACT nasal spray PLACE 1 SPRAY INTO BOTH NOSTRILS 2 (TWO) TIMES DAILY 09/13/22   Shellia Oh, MD  fluticasone -salmeterol (ADVAIR) 250-50 MCG/ACT AEPB Inhale 1 puff into the lungs in the morning and at bedtime.    [provider]  furosemide  (LASIX ) 40 MG tablet Take 1 tablet (40 mg total) by mouth daily as needed. Weigh yourself daily.  Take one dose by mouth if your weight increases by more than 2 pounds in one day or 5 pounds in one week. 09/18/23 09/17/24  Shalhoub, Zachary PARAS, MD  hydrALAZINE  (APRESOLINE ) 50 MG tablet Take 50 mg by mouth every 6 (six) hours. 04/10/23   [provider]  losartan  (COZAAR ) 50 MG tablet Take 1 tablet (50 mg total) by mouth daily. 12/08/23   Singh, Prashant K, MD  melatonin 3 MG TABS tablet Take 6 mg by mouth at bedtime.    [provider]  Multiple Vitamins-Minerals (CENTRUM SILVER PO) Take 1 tablet by mouth daily.    [provider]  oxybutynin  (DITROPAN -XL) 10  MG 24 hr tablet Take 10 mg by mouth daily. 12/09/22   [provider]  OXYGEN  Inhale 2-3 L/min into the lungs See admin instructions. When using CPAP and night, bleeding O2 3L. Oxygen  via nasal cannula at 2 L/min as needed for shortness of breath or sats < 90%.    [provider]  pantoprazole  (PROTONIX ) 40 MG tablet Take 40 mg by mouth daily.    [provider]  polyethylene glycol (MIRALAX  / GLYCOLAX ) 17 g packet Take 17 g by mouth daily as needed for mild constipation.    [provider]  potassium chloride  (KLOR-CON ) 10 MEQ tablet Take 10 mEq by mouth  daily.    [provider]  Propylene Glycol (SYSTANE COMPLETE) 0.6 % SOLN Place 1 drop into both eyes every 6 (six) hours as needed (dry eyes).    [provider]  rosuvastatin  (CRESTOR ) 5 MG tablet Take 5 mg by mouth at bedtime. 12/09/22   [provider]  senna (SENOKOT) 8.6 MG tablet Take 17.2 mg by mouth daily.    [provider]  sertraline  (ZOLOFT ) 25 MG tablet Take 12.5 mg by mouth daily.    [provider]  sodium phosphate  (FLEET) ENEM Place 1 enema rectally once as needed (constipation).    [provider]    Allergies: Patient has no known allergies.    Review of Systems  Respiratory:  Positive for shortness of breath.     Updated Vital Signs BP (!) 128/47   Pulse 60   Temp (!) 97.5 F (36.4 C) (Oral)   Resp 19   Ht 1.575 m (5' 2)   Wt 108.9 kg   SpO2 98%   BMI 43.90 kg/m   Physical Exam Vitals and nursing note reviewed.  Constitutional:      Appearance: She is ill-appearing.     Comments: Elevated BMI  HENT:     Head: Normocephalic and atraumatic.     Right Ear: External ear normal.     Left Ear: External ear normal.  Eyes:     General: No scleral icterus.       Right eye: No discharge.        Left eye: No discharge.     Conjunctiva/sclera: Conjunctivae normal.  Neck:     Trachea: No tracheal deviation.  Cardiovascular:     Rate and Rhythm: Normal rate and regular rhythm.  Pulmonary:     Effort: Pulmonary effort is normal. No respiratory distress.     Breath sounds: Normal breath sounds. No stridor. No wheezing or rales.  Abdominal:     General: Bowel sounds are normal. There is no distension.     Palpations: Abdomen is soft.     Tenderness: There is no abdominal tenderness. There is no guarding or rebound.  Musculoskeletal:        General: No tenderness or deformity.     Cervical back: Neck supple.     Right lower leg: Edema present.     Left lower leg: Edema present.  Skin:    General: Skin is  warm and dry.     Findings: No rash.  Neurological:     General: No focal deficit present.     Mental Status: She is alert.     Cranial Nerves: No cranial nerve deficit, dysarthria or facial asymmetry.     Sensory: No sensory deficit.     Motor: No abnormal muscle tone or seizure activity.     Coordination: Coordination normal.  Psychiatric:        Mood and Affect: Mood normal.     (all labs ordered are listed, but only abnormal results are displayed) Labs Reviewed  COMPREHENSIVE METABOLIC PANEL WITH GFR - Abnormal; Notable for the following components:      Result Value   Chloride 96 (*)    CO2 38 (*)    BUN 31 (*)    AST <10 (*)    GFR, Estimated 56 (*)    Anion gap 4 (*)    All other components within normal limits  CBC WITH DIFFERENTIAL/PLATELET - Abnormal; Notable for the following components:   RBC 2.62 (*)    Hemoglobin 7.7 (*)    HCT 28.2 (*)    MCV 107.6 (*)    MCHC 27.3 (*)    RDW 16.2 (*)    All other components within normal limits  AMMONIA - Abnormal; Notable for the following components:   Ammonia 38 (*)    All other components within normal limits  PRO BRAIN NATRIURETIC PEPTIDE - Abnormal; Notable for the following components:   Pro Brain Natriuretic Peptide 2,862.0 (*)    All other components within normal limits  BLOOD GAS, VENOUS - Abnormal; Notable for the following components:   pH, Ven 7.16 (*)    pCO2, Ven 114 (*)    pO2, Ven 76 (*)    Bicarbonate 40.6 (*)    Acid-Base Excess 7.5 (*)    All other components within normal limits  TROPONIN T, HIGH SENSITIVITY - Abnormal; Notable for the following components:   Troponin T High Sensitivity 26 (*)    All other components within normal limits  URINALYSIS, ROUTINE W REFLEX MICROSCOPIC  CBG MONITORING, ED  TROPONIN T, HIGH SENSITIVITY    EKG: EKG Interpretation Date/Time:  Saturday March 27 2024 16:14:20 EST Ventricular Rate:  63 PR Interval:  155 QRS Duration:  140 QT Interval:  432 QTC  Calculation: 443 R Axis:   -54  Text Interpretation: Sinus rhythm RBBB and LAFB Probable left ventricular hypertrophy Anterior Q waves, possibly due to LVH Baseline wander in lead(s) V3 V4 V5 No significant change since last tracing Confirmed by Randol Simmonds [phone]) on 03/27/2024 4:29:04 PM  Radiology: [facility] Chest Port 1 View Result Date: 03/27/2024 EXAM: 1 VIEW(S) XRAY OF THE CHEST 03/27/2024 04:03:00 PM COMPARISON: 02/01/2024 CLINICAL HISTORY: dyspnea FINDINGS: LUNGS AND PLEURA: Probable bilateral pulmonary edema is noted with associated pleural effusions left greater than right. No pneumothorax. HEART AND MEDIASTINUM: Stable cardiomegaly. BONES AND SOFT TISSUES: No acute osseous abnormality. IMPRESSION: 1. Stable cardiomegaly. 2. Probable bilateral pulmonary edema with associated pleural effusions, left greater than right. Electronically signed by: Lynwood Seip MD 03/27/2024 04:18 PM EST RP Workstation: [id number]   CT HEAD WO CONTRAST Result Date: 03/27/2024 EXAM: CT HEAD WITHOUT CONTRAST 03/27/2024 04:08:30 PM TECHNIQUE: CT of the head was performed without the administration of intravenous contrast. Automated exposure control, iterative reconstruction, and/or weight based adjustment of the mA/kV was utilized to reduce the radiation dose to as low as reasonably achievable. COMPARISON: None available. CLINICAL HISTORY: Mental status change, unknown cause. FINDINGS: BRAIN AND VENTRICLES: No acute hemorrhage. No evidence of acute infarct. No hydrocephalus. No extra-axial collection. No mass effect or midline shift. Mild atrophy and white matter changes are stable. ORBITS: No acute abnormality. SINUSES: Chronic right maxillary sinus opacification is again noted. Heterogeneous soft tissue extends into the right nasal cavity. SOFT TISSUES AND SKULL: No acute soft tissue abnormality. No skull fracture. VASCULATURE: Atherosclerotic  calcifications are present in the cavernous carotid arteries bilaterally and at the  dural margin of both vertebral arteries. No hyperdense vessel is present. IMPRESSION: 1. No acute intracranial abnormality. 2. Mild atrophy and white matter changes are stable. Electronically signed by: Lonni Necessary MD 03/27/2024 04:16 PM EST RP Workstation: [id number]     .Critical Care  Performed by: Randol Simmonds, MD Authorized by: Randol Simmonds, MD   Critical care provider statement:    Critical care time (minutes):  40   Critical care was time spent personally by me on the following activities:  Development of treatment plan with patient or surrogate, discussions with consultants, evaluation of patient's response to treatment, examination of patient, ordering and review of laboratory studies, ordering and review of radiographic studies, ordering and performing treatments and interventions, pulse oximetry, re-evaluation of patient's condition and review of old charts    Medications Ordered in the ED  furosemide  (LASIX ) injection 40 mg (40 mg Intravenous Given 03/27/24 1639)    Clinical Course as of 03/27/24 1709  Sat Mar 27, 2024  1544 CBC WITH DIFFERENTIAL(!) Hemoglobin similar compared to previous [JK]  1544 Blood gas, venous (at [facility] and [facility])(!!) His blood gas shows hypercarbia decreased pH [JK]  1629 CT without cute findings [JK]  1629 Chest x-ray shows probable bilateral pulmonary edema [JK]  1648 Bipap ordered earlier.  Pt not currently on BIPAP.  Will ask repiratory to be paged [JK]  1709 Case discussed with Dr. Arthea regarding admission [JK]    Clinical Course User Index [JK] Randol Simmonds, MD                                 Medical Decision Making Problems Addressed: Acute on chronic congestive heart failure, unspecified heart failure type [facility]): acute illness or injury that poses a threat to life or bodily functions Acute respiratory acidosis [facility]): acute illness or injury that poses a threat to life or bodily functions  Amount and/or Complexity of Data  Reviewed Labs: ordered. Decision-making details documented in ED Course. Radiology: ordered and independent interpretation performed.  Risk Prescription drug management. Decision regarding hospitalization.   Patient presented to the ED for evaluation of shortness of breath and increasing lethargy.  Previous records reviewed.  Patient does have a history of COPD CHF recurrent respiratory acidosis.  EMS reported that patient has not been compliant with the BiPAP at night at the facility.  Patient's ED workup today notable for recurrent respiratory acidosis.  In the ED patient responding but does seem lethargic not verbally answering questions.  Patient's ED workup also suggestive of CHF exacerbation with elevated BNP and pulmonary edema on chest x-ray.  No signs of cerebral hemorrhage or other acute abnormality on CT.  Lethargy likely related to respiratory acidosis.  Patient started on BiPAP.  IV Lasix  also ordered.  Prior records reviewed and patient is DNI.  Will consult with the medical service for admission and further treatment       Final diagnoses:  Acute on chronic congestive heart failure, unspecified heart failure type [facility])  Acute respiratory acidosis [facility])    ED Discharge Orders     None          Randol Simmonds, MD 03/27/24 1709

## 2024-03-28 DIAGNOSIS — J9622 Acute and chronic respiratory failure with hypercapnia: Secondary | ICD-10-CM | POA: Diagnosis not present

## 2024-03-28 LAB — BLOOD GAS, ARTERIAL
Acid-Base Excess: 17.2 mmol/L — ABNORMAL HIGH (ref 0.0–2.0)
Bicarbonate: 43.1 mmol/L — ABNORMAL HIGH (ref 20.0–28.0)
Delivery systems: POSITIVE
Drawn by: 30136
Expiratory PAP: 8 cmH2O
FIO2: 35 %
Inspiratory PAP: 18 cmH2O
O2 Saturation: 100 %
Patient temperature: 36.6
RATE: 16 {breaths}/min
pCO2 arterial: 61 mmHg — ABNORMAL HIGH (ref 32–48)
pH, Arterial: 7.46 — ABNORMAL HIGH (ref 7.35–7.45)
pO2, Arterial: 120 mmHg — ABNORMAL HIGH (ref 83–108)

## 2024-03-28 LAB — BASIC METABOLIC PANEL WITH GFR
Anion gap: 7 (ref 5–15)
BUN: 30 mg/dL — ABNORMAL HIGH (ref 8–23)
CO2: 36 mmol/L — ABNORMAL HIGH (ref 22–32)
Calcium: 9.5 mg/dL (ref 8.9–10.3)
Chloride: 97 mmol/L — ABNORMAL LOW (ref 98–111)
Creatinine, Ser: 0.82 mg/dL (ref 0.44–1.00)
GFR, Estimated: >60 mL/min (ref >60)
Glucose, Bld: 66 mg/dL — ABNORMAL LOW (ref 70–99)
Potassium: 4.7 mmol/L (ref 3.5–5.1)
Sodium: 140 mmol/L (ref 135–145)

## 2024-03-28 LAB — GLUCOSE, CAPILLARY
Glucose-Capillary: 104 mg/dL — ABNORMAL HIGH (ref 70–99)
Glucose-Capillary: 114 mg/dL — ABNORMAL HIGH (ref 70–99)
Glucose-Capillary: 118 mg/dL — ABNORMAL HIGH (ref 70–99)
Glucose-Capillary: 71 mg/dL (ref 70–99)
Glucose-Capillary: 79 mg/dL (ref 70–99)
Glucose-Capillary: 93 mg/dL (ref 70–99)
Glucose-Capillary: 98 mg/dL (ref 70–99)

## 2024-03-28 LAB — CBC
HCT: 24 % — ABNORMAL LOW (ref 36.0–46.0)
Hemoglobin: 6.7 g/dL — CL (ref 12.0–15.0)
MCH: 29 pg (ref 26.0–34.0)
MCHC: 27.9 g/dL — ABNORMAL LOW (ref 30.0–36.0)
MCV: 103.9 fL — ABNORMAL HIGH (ref 80.0–100.0)
Platelets: 170 K/uL (ref 150–400)
RBC: 2.31 MIL/uL — ABNORMAL LOW (ref 3.87–5.11)
RDW: 15.8 % — ABNORMAL HIGH (ref 11.5–15.5)
WBC: 4.5 K/uL (ref 4.0–10.5)
nRBC: 0.4 % — ABNORMAL HIGH (ref 0.0–0.2)

## 2024-03-28 LAB — HEMOGLOBIN AND HEMATOCRIT, BLOOD
HCT: 26.9 % — ABNORMAL LOW (ref 36.0–46.0)
Hemoglobin: 8.1 g/dL — ABNORMAL LOW (ref 12.0–15.0)

## 2024-03-28 LAB — OCCULT BLOOD X 1 CARD TO LAB, STOOL: Fecal Occult Bld: POSITIVE — AB

## 2024-03-28 LAB — MAGNESIUM: Magnesium: 1.7 mg/dL (ref 1.7–2.4)

## 2024-03-28 LAB — IRON AND TIBC
Iron: 149 ug/dL (ref 28–170)
Saturation Ratios: 55 % — ABNORMAL HIGH (ref 10.4–31.8)
TIBC: 273 ug/dL (ref 250–450)
UIBC: 124 ug/dL

## 2024-03-28 LAB — PREPARE RBC (CROSSMATCH)

## 2024-03-28 LAB — PRO BRAIN NATRIURETIC PEPTIDE: Pro Brain Natriuretic Peptide: 2114 pg/mL — ABNORMAL HIGH (ref <300.0)

## 2024-03-28 MED ORDER — SODIUM CHLORIDE 0.9% IV SOLUTION: Freq: Once | INTRAVENOUS | Status: AC

## 2024-03-28 MED ORDER — ORAL CARE MOUTH RINSE: 15.0000 mL | OROMUCOSAL | Status: DC | PRN

## 2024-03-28 MED ORDER — PANTOPRAZOLE SODIUM 40 MG IV SOLR
40.0000 mg | Freq: Two times a day (BID) | INTRAVENOUS | Status: DC
  Administered 2024-03-28 – 2024-03-30 (×4): 40 mg via INTRAVENOUS
  Filled 2024-03-28 (×4): qty 10

## 2024-03-28 MED ORDER — FUROSEMIDE 10 MG/ML IJ SOLN
40.0000 mg | Freq: Two times a day (BID) | INTRAMUSCULAR | Status: DC
  Administered 2024-03-28 – 2024-03-30 (×5): 40 mg via INTRAVENOUS
  Filled 2024-03-28 (×5): qty 4

## 2024-03-28 MED ORDER — LABETALOL HCL 5 MG/ML IV SOLN: 10.0000 mg | INTRAVENOUS | Status: DC | PRN

## 2024-03-28 MED ORDER — AMLODIPINE BESYLATE 10 MG PO TABS
10.0000 mg | ORAL_TABLET | Freq: Every day | ORAL | Status: DC
  Administered 2024-03-28 – 2024-03-31 (×3): 10 mg via ORAL
  Filled 2024-03-28 (×4): qty 1

## 2024-03-28 MED ORDER — LOSARTAN POTASSIUM 50 MG PO TABS
50.0000 mg | ORAL_TABLET | Freq: Every day | ORAL | Status: DC
  Administered 2024-03-28 – 2024-03-31 (×3): 50 mg via ORAL
  Filled 2024-03-28 (×4): qty 1

## 2024-03-28 MED ORDER — FOLIC ACID 1 MG PO TABS
2.0000 mg | ORAL_TABLET | Freq: Every day | ORAL | Status: DC
  Administered 2024-03-28 – 2024-03-31 (×4): 2 mg via ORAL
  Filled 2024-03-28 (×4): qty 2

## 2024-03-28 MED ORDER — HYDRALAZINE HCL 50 MG PO TABS
50.0000 mg | ORAL_TABLET | Freq: Four times a day (QID) | ORAL | Status: DC
  Administered 2024-03-28 – 2024-03-31 (×9): 50 mg via ORAL
  Filled 2024-03-28 (×10): qty 1

## 2024-03-28 NOTE — Plan of Care (Signed)
  Problem: Clinical Measurements: Goal: Ability to maintain clinical measurements within normal limits will improve Outcome: Progressing Goal: Will remain free from infection Outcome: Progressing Goal: Diagnostic test results will improve Outcome: Progressing Goal: Respiratory complications will improve Outcome: Progressing Goal: Cardiovascular complication will be avoided Outcome: Progressing   Problem: Activity: Goal: Risk for activity intolerance will decrease Outcome: Progressing   Problem: Elimination: Goal: Will not experience complications related to bowel motility Outcome: Progressing Goal: Will not experience complications related to urinary retention Outcome: Progressing   Problem: Pain Managment: Goal: General experience of comfort will improve and/or be controlled Outcome: Progressing

## 2024-03-28 NOTE — Plan of Care (Incomplete)
  Problem: Education: Goal: Knowledge of General Education information will improve Description: Including pain rating scale, medication(s)/side effects and non-pharmacologic comfort measures Outcome: Progressing   Problem: Health Behavior/Discharge Planning: Goal: Ability to manage health-related needs will improve Outcome: Progressing   Problem: Clinical Measurements: Goal: Ability to maintain clinical measurements within normal limits will improve Outcome: Progressing Goal: Will remain free from infection Outcome: Progressing Goal: Diagnostic test results will improve Outcome: Progressing Goal: Respiratory complications will improve Outcome: Progressing Goal: Cardiovascular complication will be avoided Outcome: Progressing   Problem: Activity: Goal: Risk for activity intolerance will decrease Outcome: Progressing   Problem: Nutrition: Goal: Adequate nutrition will be maintained Outcome: Progressing   Problem: Coping: Goal: Level of anxiety will decrease Outcome: Progressing   Problem: Elimination: Goal: Will not experience complications related to bowel motility Outcome: Progressing Goal: Will not experience complications related to urinary retention Outcome: Progressing   Problem: Pain Managment: Goal: General experience of comfort will improve and/or be controlled Outcome: Progressing   Problem: Safety: Goal: Ability to remain free from injury will improve Outcome: Progressing   Problem: Skin Integrity: Goal: Risk for impaired skin integrity will decrease Outcome: Progressing   Problem: Education: Goal: Individualized Educational Video(s) Outcome: Progressing   Problem: Coping: Goal: Ability to adjust to condition or change in health will improve Outcome: Progressing   Problem: Fluid Volume: Goal: Ability to maintain a balanced intake and output will improve Outcome: Progressing   Problem: Health Behavior/Discharge Planning: Goal: Ability to manage  health-related needs will improve Outcome: Progressing   Problem: Metabolic: Goal: Ability to maintain appropriate glucose levels will improve Outcome: Progressing   Problem: Nutritional: Goal: Maintenance of adequate nutrition will improve Outcome: Progressing   Problem: Skin Integrity: Goal: Risk for impaired skin integrity will decrease Outcome: Progressing   Problem: Tissue Perfusion: Goal: Adequacy of tissue perfusion will improve Outcome: Progressing   Problem: Education: Goal: Individualized Educational Video(s) Outcome: Progressing   Problem: Activity: Goal: Capacity to carry out activities will improve Outcome: Progressing   Problem: Cardiac: Goal: Ability to achieve and maintain adequate cardiopulmonary perfusion will improve Outcome: Progressing   Problem: Education: Goal: Knowledge of the prescribed therapeutic regimen will improve Outcome: Progressing   Problem: Activity: Goal: Ability to tolerate increased activity will improve Outcome: Progressing   Problem: Respiratory: Goal: Ability to maintain a clear airway will improve Outcome: Progressing Goal: Ability to maintain adequate ventilation will improve Outcome: Progressing

## 2024-03-28 NOTE — Progress Notes (Addendum)
 PROGRESS NOTE  Elizabeth [name]  FMW:[id number] DOB: [birth year] DOA: 03/27/2024 PCP: Albina GORMAN Dine, MD   Brief Narrative: Patient is 83 year old female with history of chronic hypoxic respiratory failure on nightly BiPAP but noncompliant, type 2 diabetes, obesity, OHS, CHF, asthma who presented with shortness of breath, fatigue from skilled nursing facility.  She was recently hospitalized in October for hypercarbia because she refused to wear her BiPAP.  She was discharged on auto BiPAP and oxygen .  She was found to be lethargic than usual and speaking less.  She was not using her BiPAP.  On presentation, venous blood gas showed pH of 7.16, pCO2 of 114.  Put on BiPAP.  Lab work showed elevated BNP of 2862, hemoglobin 7.7.  CT head did not show any acute intracranial abnormalities.  Chest x-ray showed bilateral pulm edema with bilateral pleural effusions left greater than right.  Started on IV Lasix .  Assessment & Plan:  Principal Problem:   Acute on chronic respiratory failure with hypercapnia (HCC) Active Problems:   Diabetes mellitus without complication (HCC)   OSA (obstructive sleep apnea)   Morbid obesity (HCC)   Obesity hypoventilation syndrome (HCC)  Acute on chronic hypoxic/hypercarbic respiratory failure: Secondary to OHS/OSA.  Uses BiPAP at night.  Uses 2 L of oxygen  at baseline.  Recently admitted for the same.  Noncompliant with BiPAP.  pCO2 was elevated on presentation.  Put on BiPAP here.  Uses supplemental oxygen  at baseline. On albuterol , cetirizine , Flonase , Advair at home. ABG done this morning showed improvement in the pCO2.  Will continue BiPAP overnight.  pH normal.  Chronic diastolic CHF: Elevated proBNP.  Takes oral Lasix  at home.  Monitor volume status.  Monitor daily weight, input/output.  Currently on IV Lasix  at 40 mg twice daily.  Has significant bilateral lower extremity edema  Macrocytic anemia: Baseline hemoglobin around 7-8.  No report of hematochezia or  melena.  No evidence of acute blood loss.  Recent vitamin B12 was more than normal but folic acid  was low at 4.8.  Continue folic acid  supplementation.  Being given a unit of PRBC.  Monitor hemoglobin.  Will check FOBT.  Iron panel about a month ago showed low iron, will order repeat iron level.  If low, will order IV iron  Hypertension: On amlodipine , hydralazine , losartan .  Continue the same.  Paroxysmal A-fib: Currently she is on normal sinus rhythm.  On Eliquis  for anticoagulation.  Hyperlipidemia: On statin  Mood disorder: On donepezil , sertraline   Morbid obesity: BMI of 45.3  Goals of care: Elderly patient with multiple comorbidities, frequent readmissions.  Noncompliant with BiPAP.Patient is enrolled with outpatient hospice.  CODE STATUS DNR.She is nonambulatory at baseline.     Wound 03/27/24 2111 Pressure Injury Sacrum Medial Stage 2 -  Partial thickness loss of dermis presenting as a shallow open injury with a red, pink wound bed without slough. (Active)    DVT prophylaxis: apixaban  (ELIQUIS ) tablet 5 mg     Code Status: Limited: Do not attempt resuscitation (DNR) -DNR-LIMITED -Do Not Intubate/DNI   Family Communication:Called and discussed with daughter Duayne on phone on 12/7  Patient status:Inpatient  Patient is from :[facility]  Anticipated discharge to:SNF  Estimated DC date:2-3 days   Consultants: None  Procedures:None  Antimicrobials:  Anti-infectives (From admission, onward)    None       Subjective: Patient seen and examined at bedside today.  Hemodynamically stable.  Hypotensive this morning.  She is alert and awake, mostly oriented.  On BiPAP.  Denies  any worsening shortness of breath or cough.  Has bilateral lower extremity pitting edema.  ABG looks improved.  Plan to hold  the BiPAP  Objective: Vitals:   03/28/24 0445 03/28/24 0512 03/28/24 0600 03/28/24 0700  BP:  (!) 182/56 (!) 186/76 (!) 190/73  Pulse: 63 64 (!) 59 67  Resp: 18 18 20 19    Temp:      TempSrc:      SpO2: 99% 100% 99% 99%  Weight:      Height:        Intake/Output Summary (Last 24 hours) at 03/28/2024 9177 Last data filed at 03/28/2024 0600 Gross per 24 hour  Intake 210 ml  Output 1962 ml  Net -1752 ml   Filed Weights   03/27/24 1447 03/27/24 2100  Weight: 108.9 kg 112.5 kg    Examination:  General exam: Overall comfortable, not in distress, obese HEENT: PERRL Respiratory system: Diminished sounds at bases, mild bibasilar crackles Cardiovascular system: S1 & S2 heard, RRR.  Gastrointestinal system: Abdomen is nondistended, soft and nontender. Central nervous system: Alert and oriented Extremities: Bilateral lower extremity pitting edema, no clubbing ,no cyanosis Skin: No rashes, no ulcers,no icterus     Data Reviewed: I have personally reviewed following labs and imaging studies  CBC: Recent Labs  Lab 03/27/24 1526 03/28/24 0309  WBC 5.6 4.5  NEUTROABS 2.4  --   HGB 7.7* 6.7*  HCT 28.2* 24.0*  MCV 107.6* 103.9*  PLT 195 170   Basic Metabolic Panel: Recent Labs  Lab 03/27/24 1526 03/28/24 0309  NA 138 140  K 5.1 4.7  CL 96* 97*  CO2 38* 36*  GLUCOSE 90 66*  BUN 31* 30*  CREATININE 1.00 0.82  CALCIUM  9.6 9.5  MG  --  1.7     Recent Results (from the past 240 hours)  MRSA Next Gen by PCR, Nasal     Status: None   Collection Time: 03/27/24  8:32 PM   Specimen: Nasal Mucosa; Nasal Swab  Result Value Ref Range Status   MRSA by PCR Next Gen NOT DETECTED NOT DETECTED Final    Comment: (NOTE) The GeneXpert MRSA Assay (FDA approved for NASAL specimens only), is one component of a comprehensive MRSA colonization surveillance program. It is not intended to diagnose MRSA infection nor to guide or monitor treatment for MRSA infections. Test performance is not FDA approved in patients less than 51 years old. Performed at [facility], [address], [place], [place] [address]      Radiology Studies: DG  Chest Port 1 View Result Date: 03/27/2024 EXAM: 1 VIEW(S) XRAY OF THE CHEST 03/27/2024 04:03:00 PM COMPARISON: 02/01/2024 CLINICAL HISTORY: dyspnea FINDINGS: LUNGS AND PLEURA: Probable bilateral pulmonary edema is noted with associated pleural effusions left greater than right. No pneumothorax. HEART AND MEDIASTINUM: Stable cardiomegaly. BONES AND SOFT TISSUES: No acute osseous abnormality. IMPRESSION: 1. Stable cardiomegaly. 2. Probable bilateral pulmonary edema with associated pleural effusions, left greater than right. Electronically signed by: Lynwood Seip MD 03/27/2024 04:18 PM EST RP Workstation: [id number]   CT HEAD WO CONTRAST Result Date: 03/27/2024 EXAM: CT HEAD WITHOUT CONTRAST 03/27/2024 04:08:30 PM TECHNIQUE: CT of the head was performed without the administration of intravenous contrast. Automated exposure control, iterative reconstruction, and/or weight based adjustment of the mA/kV was utilized to reduce the radiation dose to as low as reasonably achievable. COMPARISON: None available. CLINICAL HISTORY: Mental status change, unknown cause. FINDINGS: BRAIN AND VENTRICLES: No acute hemorrhage. No evidence of acute infarct.  No hydrocephalus. No extra-axial collection. No mass effect or midline shift. Mild atrophy and white matter changes are stable. ORBITS: No acute abnormality. SINUSES: Chronic right maxillary sinus opacification is again noted. Heterogeneous soft tissue extends into the right nasal cavity. SOFT TISSUES AND SKULL: No acute soft tissue abnormality. No skull fracture. VASCULATURE: Atherosclerotic calcifications are present in the cavernous carotid arteries bilaterally and at the dural margin of both vertebral arteries. No hyperdense vessel is present. IMPRESSION: 1. No acute intracranial abnormality. 2. Mild atrophy and white matter changes are stable. Electronically signed by: Lonni Necessary MD 03/27/2024 04:16 PM EST RP Workstation: [id number]    Scheduled Meds:  sodium  chloride   Intravenous Once   apixaban   5 mg Oral BID   Chlorhexidine  Gluconate Cloth  6 each Topical Daily   diclofenac  Sodium  4 g Topical QID   donepezil   5 mg Oral QHS   furosemide   20 mg Intravenous Daily   melatonin  6 mg Oral QHS   mouth rinse  15 mL Mouth Rinse 4 times per day   pantoprazole   40 mg Oral Daily   senna  17.2 mg Oral Daily   sertraline   12.5 mg Oral Daily   sodium chloride  flush  3 mL Intravenous Q12H   Continuous Infusions:   LOS: 1 day   Ivonne Mustache, MD [facility] P12/10/2023, 8:22 AM

## 2024-03-28 NOTE — Progress Notes (Signed)
   03/28/24 0445  BiPAP/CPAP/SIPAP  BiPAP/CPAP/SIPAP Pt Type Adult  BiPAP/CPAP/SIPAP V60  Reason BIPAP/CPAP not in use Other(comment)  Mask Type Full face mask  Dentures removed? Not applicable  Mask Size Medium  Set Rate 16 breaths/min  Respiratory Rate 20 breaths/min  IPAP 20 cmH20  EPAP 8 cmH2O  FiO2 (%) 35 %  Flow Rate 0 lpm  Minute Ventilation 5.2  Leak 1  Peak Inspiratory Pressure (PIP) 21  Tidal Volume (Vt) 280  Patient Home Machine No  Patient Home Mask No  Patient Home Tubing No  Auto Titrate No  Press High Alarm 30 cmH2O  Press Low Alarm 5 cmH2O  Device Plugged into RED Power Outlet Yes  BiPAP/CPAP /SiPAP Vitals  Pulse Rate 63  Resp 18  SpO2 99 %  MEWS Score/Color  MEWS Score 0  MEWS Score Color Elizabeth [name]

## 2024-03-28 NOTE — Progress Notes (Signed)
 Elizabeth [name] [address] [facility] Liaison Note:   Elizabeth [name] a current hospice patient with [facility] with a terminal diagnosis of  Respiratory Failure. Pt transported by EMS from [facility] for SOB. Patient is admitted for Acute on chronic respiratory failure with hypercapnia. Per Dr. Deane [name] with [facility] this is a related hospital admission. Patient is a DNR.   Patient seen at bedside. Denies any concerns at this time. Reports yesterday she was having difficulty breathing, but today feeling better. Spoke with nurse caring for patient. ABG's obtained and patient only needing BiPap when sleeping. Patient received 1 unit of PRBC. Spoke with patient daughter via phone and she says that she is likely going to change DNR status. She says she feels that hospice may no longer be the best option. She says that she feels her mother is telling her she wants to fight and she wants to give her the opportunity. She also said it is her plan to go to facility nightly to ensure that patient has on her bipap. She states that facility staff has not been applying. I suggested before deciding to terminate hospice benefits, to speak with [facility] Liaison as well as Hospital provider regarding patient condition and options, however we will honor whatever her wishes are. [facility] Liasison notified hospital care team of daughter concerns.      Per chart review, patient remains GIP appropriate for monitoring and treatment of acute on chronic respiratory failure with hypercapnia requiring IV diuretic.   Vital Signs: 98.8/70/20    135/39  93% on [facility]   I/O: 210/1962   Abnormal Labs:  03/27/24 15:25 pH, Ven: 7.16 (LL) pCO2, Ven: 114 (HH) pO2, Ven: 76 (H) Acid-Base Excess: 7.5 (H) Bicarbonate: 40.6 (H) Ammonia: 38 (H)  03/27/24 15:26 Comprehensive metabolic panel with GFR: Rpt ! Chloride: 96 (L) CO2: 38 (H) BUN: 31 (H) Anion gap: 4 (L) AST: <10 (L) GFR, Estimated: 56  (L) Pro Brain Natriuretic Peptide: 2,862.0 (H) Troponin T High Sensitivity: 26 (H) RBC: 2.62 (L) Hemoglobin: 7.7 (L) HCT: 28.2 (L) MCV: 107.6 (H) MCHC: 27.3 (L) RDW: 16.2 (H)  03/27/24 16:33 Troponin T High Sensitivity: 25 (H)  03/27/24 18:07 Mode: BILEVEL POSITIVE AIRWAY PRESSURE pH, Arterial: 7.23 (L) pCO2 arterial: 96 (HH) pO2, Arterial: 78 (L) Acid-Base Excess: 11.1 (H) Bicarbonate: 40.2 (H)   03/28/24 03:09 Basic metabolic panel with GFR: Rpt ! Chloride: 97 (L) CO2: 36 (H) Glucose: 66 (L) BUN: 30 (H) Pro Brain Natriuretic Peptide: 2,114.0 (H) RBC: 2.31 (L) Hemoglobin: 6.7 (LL) HCT: 24.0 (L) MCV: 103.9 (H) MCHC: 27.9 (L) RDW: 15.8 (H) Platelets: 170 nRBC: 0.4 (H)  03/28/24 08:55 Delivery systems: BILEVEL POSITIVE AIRWAY PRESSURE pH, Arterial: 7.46 (H) pCO2 arterial: 61 (H) pO2, Arterial: 120 (H) Acid-Base Excess: 17.2 (H)    Diagnostics:  DG Chest Port 1 View  Narrative & Impression EXAM: 1 VIEW(S) XRAY OF THE CHEST 03/27/2024 04:03:00 PM   CLINICAL HISTORY: dyspnea   IMPRESSION: 1. Stable cardiomegaly. 2. Probable bilateral pulmonary edema with associated pleural effusions, left greater than right.   Electronically signed by: Elizabeth Seip MD 03/27/2024 04:18 PM EST RP Workstation: [id number]   CT HEAD WITHOUT CONTRAST 03/27/2024 04:08:30 PM     CLINICAL HISTORY: Mental status change, unknown cause.     IMPRESSION: 1. No acute intracranial abnormality. 2. Mild atrophy and white matter changes are stable.   Electronically signed by: Elizabeth Necessary MD 03/27/2024 04:16 PM EST RP Workstation: [id number]   03/27/24 20:32 Urinalysis, Routine  w reflex microscopic: Rpt !  Color, Urine: STRAW ! Leukocytes,Ua: MODERATE ! Mucus: PRESENT    IV/PRN Meds:  Dextrose  12.5g IV x1, Lasix  40mg  IV x2, 1 unit PRBC IV x1   Assessment and Plan per Elizabeth Buttery, MD on 12.07.25 Assessment & Plan:   Principal Problem:   Acute on chronic  respiratory failure with hypercapnia (HCC) Active Problems:   Diabetes mellitus without complication (HCC)   OSA (obstructive sleep apnea)   Morbid obesity (HCC)   Obesity hypoventilation syndrome (HCC)   Acute on chronic hypoxic/hypercarbic respiratory failure: Secondary to OHS/OSA.  Uses BiPAP at night.  Uses 2 L of oxygen  at baseline.  Recently admitted for the same.  Noncompliant with BiPAP.  pCO2 was elevated on presentation.  Put on BiPAP here.  Uses supplemental oxygen  at baseline. On albuterol , cetirizine , Flonase , Advair at home. ABG done this morning showed improvement in the pCO2.  Will continue BiPAP overnight.  pH normal.   Chronic diastolic CHF: Elevated proBNP.  Takes oral Lasix  at home.  Monitor volume status.  Monitor daily weight, input/output.  Currently on IV Lasix  at 40 mg twice daily.  Has significant bilateral lower extremity edema   Macrocytic anemia: Baseline hemoglobin around 7-8.  No report of hematochezia or melena.  No evidence of acute blood loss.  Recent vitamin B12 was more than normal but folic acid  was low at 4.8.  Continue folic acid  supplementation.  Being given a unit of PRBC.  Monitor hemoglobin.  Will check FOBT.  Iron panel about a month ago showed low iron, will order repeat iron level.  If low, will order IV iron   Hypertension: On amlodipine , hydralazine , losartan .  Continue the same.   Paroxysmal A-fib: Currently she is on normal sinus rhythm.  On Eliquis  for anticoagulation.   Hyperlipidemia: On statin   Mood disorder: On donepezil , sertraline    Morbid obesity: BMI of 45.3  Discharge Planning: Estimated DC date:2-3 days    Family Contact:  Spoke with daughter Elizabeth [name] via phone.     IDT: updated   Goals of Care: DNR   Medication list and Transfer Summary placed under Media Tab.     Should patient need ambulance transfer at discharge- please use [facility]) as they contract this service for our active hospice patients.     Please call with any hospice related questions or concerns.   Elizabeth [name], BSN, RN [facility] Liaison [phone]

## 2024-03-28 NOTE — Progress Notes (Signed)
 LATE UPDATE.  PATIENT WAS TRANSFERRED FROM THE ER TO THE ICU ON BIPAP NO PROBLEMS WERE NOTED, AND THE PATIENT DID FINE.

## 2024-03-28 NOTE — Progress Notes (Signed)
   03/27/24 2012  BiPAP/CPAP/SIPAP  BiPAP/CPAP/SIPAP Pt Type Adult  BiPAP/CPAP/SIPAP V60  Mask Type Full face mask  Dentures removed? Not applicable  Mask Size Medium  Set Rate 16 breaths/min  Respiratory Rate 16 breaths/min  IPAP 20 cmH20  EPAP 8 cmH2O  FiO2 (%) 35 %  Minute Ventilation 7.9  Leak 9  Peak Inspiratory Pressure (PIP) 21  Tidal Volume (Vt) 267  Patient Home Machine No  Patient Home Tubing No  Auto Titrate No  Press High Alarm 30 cmH2O  Press Low Alarm 5 cmH2O  Device Plugged into RED Power Outlet Yes  BiPAP/CPAP /SiPAP Vitals  Bilateral Breath Sounds Clear;Diminished

## 2024-03-28 NOTE — Progress Notes (Signed)
   03/27/24 2340  BiPAP/CPAP/SIPAP  BiPAP/CPAP/SIPAP Pt Type Adult  BiPAP/CPAP/SIPAP V60  Mask Type Full face mask  Dentures removed? Not applicable  Mask Size Medium  Set Rate 16 breaths/min  Respiratory Rate 17 breaths/min  IPAP 20 cmH20  EPAP 8 cmH2O  FiO2 (%) 35 %  Flow Rate 0 lpm  Minute Ventilation 4.4  Leak 2  Peak Inspiratory Pressure (PIP) 21  Tidal Volume (Vt) 356  Patient Home Machine No  Patient Home Mask No  Patient Home Tubing No  Auto Titrate No  Press High Alarm 30 cmH2O  Press Low Alarm 5 cmH2O  Device Plugged into RED Power Outlet Yes  BiPAP/CPAP /SiPAP Vitals  Temp (!) 97.2 F (36.2 C)  Pulse Rate (!) 55  Resp 16  SpO2 100 %  MEWS Score/Color  MEWS Score 1  MEWS Score Color Green     03/27/24 2340  BiPAP/CPAP/SIPAP  BiPAP/CPAP/SIPAP Pt Type Adult  BiPAP/CPAP/SIPAP V60  Mask Type Full face mask  Dentures removed? Not applicable  Mask Size Medium  Set Rate 16 breaths/min  Respiratory Rate 17 breaths/min  IPAP 20 cmH20  EPAP 8 cmH2O  FiO2 (%) 35 %  Flow Rate 0 lpm  Minute Ventilation 4.4  Leak 2  Peak Inspiratory Pressure (PIP) 21  Tidal Volume (Vt) 356  Patient Home Machine No  Patient Home Mask No  Patient Home Tubing No  Auto Titrate No  Press High Alarm 30 cmH2O  Press Low Alarm 5 cmH2O  Device Plugged into RED Power Outlet Yes  BiPAP/CPAP /SiPAP Vitals  Temp (!) 97.2 F (36.2 C)  Pulse Rate (!) 55  Resp 16  SpO2 100 %  MEWS Score/Color  MEWS Score 1  MEWS Score Color Green

## 2024-03-28 NOTE — TOC Progression Note (Signed)
 Transition of Care [facility]) - Progression Note    Patient Details  Name: Elizabeth [name] MRN: [id number] Date of Birth: 1940/09/02  [facility] CM/SW Contact  Lorraine LILLETTE Fenton, LCSW Phone Number: 03/28/2024, 1:59 PM  Clinical Narrative:     CSW received message from hospice provider:  Hello team, just want to keep everyone in the loop. I just spoke with patient daughter and she says that she is likely going to change DNR status. She says she feels that hospice may no longer be the best option. She says that she feels her mother is telling her she wants to fight and she wants to give her the opportunity. She also said it is her plan to go to facility nightly to ensure that patient has on her bipap. She states that facility staff has not been applying. I suggested before deciding to terminate hospice benefits, to speak with [facility] Liaison as well as Hospital provider, however we will honor whatever her wishes are.  ICM to follow.   Barriers to Discharge: Continued Medical Work up               Expected Discharge Plan and Services                                               Social Drivers of Health (SDOH) Interventions SDOH Screenings   Food Insecurity: No Food Insecurity (03/28/2024)  Housing: Low Risk  (03/28/2024)  Transportation Needs: No Transportation Needs (03/28/2024)  Utilities: Not At Risk (03/28/2024)  Social Connections: Patient Declined (03/28/2024)  Tobacco Use: Medium Risk (03/27/2024)    Readmission Risk Interventions    01/25/2024    3:44 PM 04/28/2023   11:56 AM  Readmission Risk Prevention Plan  Transportation Screening Complete Complete  PCP or Specialist Appt within 5-7 Days  Complete  PCP or Specialist Appt within 3-5 Days Complete   Home Care Screening  Complete  Medication Review (RN CM)  Complete  HRI or Home Care Consult Complete   Social Work Consult for Recovery Care Planning/Counseling Complete   Palliative Care Screening  Not Applicable   Medication Review Oceanographer) Complete

## 2024-03-29 ENCOUNTER — Inpatient Hospital Stay (HOSPITAL_COMMUNITY)

## 2024-03-29 DIAGNOSIS — J9622 Acute and chronic respiratory failure with hypercapnia: Secondary | ICD-10-CM | POA: Diagnosis not present

## 2024-03-29 LAB — GLUCOSE, CAPILLARY
Glucose-Capillary: 82 mg/dL (ref 70–99)
Glucose-Capillary: 85 mg/dL (ref 70–99)
Glucose-Capillary: 106 mg/dL — ABNORMAL HIGH (ref 70–99)
Glucose-Capillary: 127 mg/dL — ABNORMAL HIGH (ref 70–99)
Glucose-Capillary: 139 mg/dL — ABNORMAL HIGH (ref 70–99)
Glucose-Capillary: 91 mg/dL (ref 70–99)

## 2024-03-29 LAB — TYPE AND SCREEN
ABO/RH(D): A POS
Antibody Screen: NEGATIVE
Unit division: 0

## 2024-03-29 LAB — CBC
HCT: 27.2 % — ABNORMAL LOW (ref 36.0–46.0)
Hemoglobin: 8 g/dL — ABNORMAL LOW (ref 12.0–15.0)
MCH: 28.2 pg (ref 26.0–34.0)
MCHC: 29.4 g/dL — ABNORMAL LOW (ref 30.0–36.0)
MCV: 95.8 fL (ref 80.0–100.0)
Platelets: 182 K/uL (ref 150–400)
RBC: 2.84 MIL/uL — ABNORMAL LOW (ref 3.87–5.11)
RDW: 18.7 % — ABNORMAL HIGH (ref 11.5–15.5)
WBC: 4.7 K/uL (ref 4.0–10.5)
nRBC: 0 % (ref 0.0–0.2)

## 2024-03-29 LAB — BASIC METABOLIC PANEL WITH GFR
Anion gap: 4 — ABNORMAL LOW (ref 5–15)
BUN: 23 mg/dL (ref 8–23)
CO2: 42 mmol/L — ABNORMAL HIGH (ref 22–32)
Calcium: 9.4 mg/dL (ref 8.9–10.3)
Chloride: 94 mmol/L — ABNORMAL LOW (ref 98–111)
Creatinine, Ser: 0.69 mg/dL (ref 0.44–1.00)
GFR, Estimated: >60 mL/min (ref >60)
Glucose, Bld: 74 mg/dL (ref 70–99)
Potassium: 4.3 mmol/L (ref 3.5–5.1)
Sodium: 140 mmol/L (ref 135–145)

## 2024-03-29 LAB — BPAM RBC
Blood Product Expiration Date: 202512272359
ISSUE DATE / TIME: 202512070904
Unit Type and Rh: 6200

## 2024-03-29 LAB — PRO BRAIN NATRIURETIC PEPTIDE: Pro Brain Natriuretic Peptide: 1808 pg/mL — ABNORMAL HIGH (ref <300.0)

## 2024-03-29 MED ORDER — POLYETHYLENE GLYCOL 3350 17 G PO PACK
17.0000 g | PACK | Freq: Once | ORAL | Status: DC
  Filled 2024-03-29: qty 1

## 2024-03-29 MED ORDER — FERROUS SULFATE 325 (65 FE) MG PO TABS
325.0000 mg | ORAL_TABLET | Freq: Every day | ORAL | Status: DC
  Administered 2024-03-29 – 2024-03-31 (×3): 325 mg via ORAL
  Filled 2024-03-29 (×3): qty 1

## 2024-03-29 NOTE — Progress Notes (Signed)
 [facility] liaison note:    Patient revoked hospice services in order to seek further treatment.    Outpatient palliative referral will be initiated.    Please don't hesitate to reach out for any hospice related questions or concerns.   Eleanor Nail, [facility] liaison [phone]

## 2024-03-29 NOTE — Progress Notes (Signed)
 PROGRESS NOTE  Elizabeth [name]  FMW:[id number] DOB: 10-12-1940 DOA: 03/27/2024 PCP: Albina GORMAN Dine, MD   Brief Narrative: Patient is 83 year old female with history of chronic hypoxic respiratory failure on nightly BiPAP but noncompliant, type 2 diabetes, obesity, OHS, CHF, asthma who presented with shortness of breath, fatigue from skilled nursing facility.  She was recently hospitalized in October for hypercarbia because she refused to wear her BiPAP.  She was discharged on auto BiPAP and oxygen .  She was found to be lethargic than usual and speaking less.  She was not using her BiPAP.  On presentation, venous blood gas showed pH of 7.16, pCO2 of 114.  Put on BiPAP.  Lab work showed elevated BNP of 2862, hemoglobin 7.7.  CT head did not show any acute intracranial abnormalities.  Chest x-ray showed bilateral pulm edema with bilateral pleural effusions left greater than right.  Started on IV Lasix .  Significant improvement in the respiratory status now.  Assessment & Plan:  Principal Problem:   Acute on chronic respiratory failure with hypercapnia (HCC) Active Problems:   Diabetes mellitus without complication (HCC)   OSA (obstructive sleep apnea)   Morbid obesity (HCC)   Obesity hypoventilation syndrome (HCC)  Acute on chronic hypoxic/hypercarbic respiratory failure: Secondary to OHS/OSA.  Uses BiPAP at night.  Uses 2 L of oxygen  at baseline.  Recently admitted for the same.  Noncompliant with BiPAP.  pCO2 was elevated on presentation.  Put on BiPAP here.  Uses supplemental oxygen  at baseline. On albuterol , cetirizine , Flonase , Advair at home. ABG done on 12/7 showed improvement in the pCO2, normal pH.  Will continue BiPAP at night only.  Currently back on 2 L of oxygen   Chronic diastolic CHF: Elevated proBNP.  Takes oral Lasix  at home.  Monitor volume status.  Monitor daily weight, input/output.  Currently on IV Lasix  at 40 mg twice daily.  Had significant bilateral lower extremity edema  with the looks improved.  Continue IV Lasix  for today.  Macrocytic anemia/GI bleed/positive FOBT: Baseline hemoglobin around 7-8.  No report of hematochezia or melena.  Recent vitamin B12 was more than normal but folic acid  was low at 4.8.  Continue folic acid  supplementation.  She  was given a unit of PRBC.  Positive check FOBT.  Iron panel about a month ago showed low iron, but repeat iron level shows optimal iron.  Continue oral iron supplementation.  Currently on IV Protonix .  GI saw her here, no plan for EGD or colonoscopy.  Recommended to restart Eliquis  in 48 hours.  Hypertension: On amlodipine , hydralazine , losartan .  Continue the same.  Paroxysmal A-fib: Currently she is on normal sinus rhythm.  On Eliquis  for anticoagulation.  She is to discuss with her cardiologist as an outpatient about continue Eliquis  or  out because of occult GI bleed  Hyperlipidemia: On statin  Mood disorder: On donepezil , sertraline   Morbid obesity: BMI of 45.3  Goals of care: Elderly patient with multiple comorbidities, frequent readmissions.  Noncompliant with BiPAP.Patient is enrolled with outpatient hospice.  CODE STATUS DNR.She is nonambulatory at baseline.     Wound 03/27/24 2111 Pressure Injury Sacrum Medial Stage 2 -  Partial thickness loss of dermis presenting as a shallow open injury with a red, pink wound bed without slough. (Active)    DVT prophylaxis:     Code Status: Limited: Do not attempt resuscitation (DNR) -DNR-LIMITED -Do Not Intubate/DNI   Family Communication:Called and discussed with daughter Duayne on phone on 12/8  Patient status:Inpatient  Patient is  from :[facility]  Anticipated discharge to:SNF  Estimated DC date:1-2 days   Consultants: GI   Procedures:None  Antimicrobials:  Anti-infectives (From admission, onward)    None       Subjective: Patient seen and examined at bedside today.  Hemodynamically stable and looks very comfortable this morning.  Alert and  oriented.  On 2 L of oxygen  per minute.  Denies shortness of breath or cough.  Eating her breakfast.  Lower extremity edema improving.  Objective: Vitals:   03/29/24 0733 03/29/24 0800 03/29/24 1000 03/29/24 1019  BP:  (!) 133/38 (!) 106/50 (!) 125/32  Pulse:  64 62 61  Resp:  (!) 21 (!) 23 19  Temp: 97.6 F (36.4 C)     TempSrc: Oral     SpO2:  100% 97% 99%  Weight:      Height:        Intake/Output Summary (Last 24 hours) at 03/29/2024 1119 Last data filed at 03/29/2024 0920 Gross per 24 hour  Intake 955 ml  Output 3450 ml  Net -2495 ml   Filed Weights   03/27/24 1447 03/27/24 2100 03/29/24 0600  Weight: 108.9 kg 112.5 kg 110.3 kg    Examination:  General exam: Overall comfortable, not in distress,obese HEENT: PERRL Respiratory system: Crackles on bilateral bases Cardiovascular system: S1 & S2 heard, RRR.  Gastrointestinal system: Abdomen is nondistended, soft and nontender. Central nervous system: Alert and oriented Extremities: Improved bilateral lower extremity pitting edema, no clubbing ,no cyanosis Skin: No rashes, no ulcers,no icterus     Data Reviewed: I have personally reviewed following labs and imaging studies  CBC: Recent Labs  Lab 03/27/24 1526 03/28/24 0309 03/28/24 1501 03/29/24 0251  WBC 5.6 4.5  --  4.7  NEUTROABS 2.4  --   --   --   HGB 7.7* 6.7* 8.1* 8.0*  HCT 28.2* 24.0* 26.9* 27.2*  MCV 107.6* 103.9*  --  95.8  PLT 195 170  --  182   Basic Metabolic Panel: Recent Labs  Lab 03/27/24 1526 03/28/24 0309 03/29/24 0251  NA 138 140 140  K 5.1 4.7 4.3  CL 96* 97* 94*  CO2 38* 36* 42*  GLUCOSE 90 66* 74  BUN 31* 30* 23  CREATININE 1.00 0.82 0.69  CALCIUM  9.6 9.5 9.4  MG  --  1.7  --      Recent Results (from the past 240 hours)  MRSA Next Gen by PCR, Nasal     Status: None   Collection Time: 03/27/24  8:32 PM   Specimen: Nasal Mucosa; Nasal Swab  Result Value Ref Range Status   MRSA by PCR Next Gen NOT DETECTED NOT DETECTED  Final    Comment: (NOTE) The GeneXpert MRSA Assay (FDA approved for NASAL specimens only), is one component of a comprehensive MRSA colonization surveillance program. It is not intended to diagnose MRSA infection nor to guide or monitor treatment for MRSA infections. Test performance is not FDA approved in patients less than 95 years old. Performed at [facility], [address] [place], [place] [address]      Radiology Studies: DG Chest Port 1 View Result Date: 03/27/2024 EXAM: 1 VIEW(S) XRAY OF THE CHEST 03/27/2024 04:03:00 PM COMPARISON: 02/01/2024 CLINICAL HISTORY: dyspnea FINDINGS: LUNGS AND PLEURA: Probable bilateral pulmonary edema is noted with associated pleural effusions left greater than right. No pneumothorax. HEART AND MEDIASTINUM: Stable cardiomegaly. BONES AND SOFT TISSUES: No acute osseous abnormality. IMPRESSION: 1. Stable cardiomegaly. 2. Probable bilateral pulmonary  edema with associated pleural effusions, left greater than right. Electronically signed by: Lynwood Seip MD 03/27/2024 04:18 PM EST RP Workstation: [id number]   CT HEAD WO CONTRAST Result Date: 03/27/2024 EXAM: CT HEAD WITHOUT CONTRAST 03/27/2024 04:08:30 PM TECHNIQUE: CT of the head was performed without the administration of intravenous contrast. Automated exposure control, iterative reconstruction, and/or weight based adjustment of the mA/kV was utilized to reduce the radiation dose to as low as reasonably achievable. COMPARISON: None available. CLINICAL HISTORY: Mental status change, unknown cause. FINDINGS: BRAIN AND VENTRICLES: No acute hemorrhage. No evidence of acute infarct. No hydrocephalus. No extra-axial collection. No mass effect or midline shift. Mild atrophy and white matter changes are stable. ORBITS: No acute abnormality. SINUSES: Chronic right maxillary sinus opacification is again noted. Heterogeneous soft tissue extends into the right nasal cavity. SOFT TISSUES AND SKULL: No  acute soft tissue abnormality. No skull fracture. VASCULATURE: Atherosclerotic calcifications are present in the cavernous carotid arteries bilaterally and at the dural margin of both vertebral arteries. No hyperdense vessel is present. IMPRESSION: 1. No acute intracranial abnormality. 2. Mild atrophy and white matter changes are stable. Electronically signed by: Lonni Necessary MD 03/27/2024 04:16 PM EST RP Workstation: [id number]    Scheduled Meds:  amLODipine   10 mg Oral Daily   Chlorhexidine  Gluconate Cloth  6 each Topical Daily   diclofenac  Sodium  4 g Topical QID   donepezil   5 mg Oral QHS   ferrous sulfate   325 mg Oral Q breakfast   folic acid   2 mg Oral Daily   furosemide   40 mg Intravenous Q12H   hydrALAZINE   50 mg Oral Q6H   losartan   50 mg Oral Daily   melatonin  6 mg Oral QHS   pantoprazole  (PROTONIX ) IV  40 mg Intravenous Q12H   senna  17.2 mg Oral Daily   sertraline   12.5 mg Oral Daily   sodium chloride  flush  3 mL Intravenous Q12H   Continuous Infusions:   LOS: 2 days   Ivonne Mustache, MD [facility] P12/11/2023, 11:19 AM

## 2024-03-29 NOTE — Progress Notes (Signed)
   03/28/24 2353  BiPAP/CPAP/SIPAP  BiPAP/CPAP/SIPAP Pt Type Adult  BiPAP/CPAP/SIPAP V60  Mask Type Full face mask  Dentures removed? Not applicable  Mask Size Medium  Set Rate 16 breaths/min  Respiratory Rate 18 breaths/min  IPAP 18 cmH20  EPAP 8 cmH2O  FiO2 (%) 35 %  Flow Rate 0 lpm  Minute Ventilation 5.7  Leak 1  Peak Inspiratory Pressure (PIP) 18  Tidal Volume (Vt) 316  Patient Home Machine No  Patient Home Mask No  Patient Home Tubing No  Auto Titrate No  Press High Alarm 30 cmH2O  Press Low Alarm 5 cmH2O  Device Plugged into RED Power Outlet Yes  Oxygen  Percent 35 %

## 2024-03-29 NOTE — Consult Note (Addendum)
 Referring Provider: TH Primary Care Physician:  Albina GORMAN Dine, MD Primary Gastroenterologist: [facility]  Reason for Consultation: Anemia, heme positive stool, CHF exacerbation  HPI: Elizabeth [name] is a 83 y.o. female with past medical history of paroxysmal atrial fibrillation currently on Eliquis , history of CHF, history of chronic respiratory failure who is noncompliant on BiPAP and history of chronic anemia presented to the hospital with shortness of breath and fatigue.  Was found to be in respiratory failure and CHF exacerbation. She was found to have drop in hemoglobin to 6.7 yesterday with baseline hemoglobin of around 7-8.  Occult blood came back positive.  GI is consulted for further evaluation.  Iron studies 1 month ago showed low iron and low iron saturation.  Normal TIBC.  Normal ferritin.  Low folate level.  Normal vitamin B12 level.  Normal LFTs.  CT abdomen pelvis without contrast in May 2025 showed no acute GI changes.  Patient seen and examined at bedside.  She denies any acute GI symptoms.  Denies seeing any blood in the stool or black stool.  Had colonoscopy before but not sure when.  Denies any family history of colon cancer.  Past Medical History:  Diagnosis Date   (HFpEF) heart failure with preserved ejection fraction (HCC) 12/24/2022   Arthritis    Asthma    Breast mass in female    Bronchitis    Bronchitis    Constipation    Cough    GERD (gastroesophageal reflux disease)    Hypertension associated with diabetes (HCC)    Type 2 diabetes mellitus (HCC)    Wheezing     Past Surgical History:  Procedure Laterality Date   ABDOMINAL HYSTERECTOMY  1996   CYSTECTOMY  1980's   back of neck   THYROID  SURGERY  2001    Prior to Admission medications   Medication Sig Start Date End Date Taking? Authorizing Provider  acetaminophen  (TYLENOL ) 500 MG tablet Take 500 mg by mouth 3 (three) times daily.    [provider]  albuterol  (PROVENTIL  HFA;VENTOLIN   HFA) 108 (90 BASE) MCG/ACT inhaler Inhale 2 puffs into the lungs every 6 (six) hours as needed for shortness of breath.    [provider]  albuterol  (PROVENTIL ) (2.5 MG/3ML) 0.083% nebulizer solution Take 2.5 mg by nebulization every 6 (six) hours.    [provider]  amLODipine  (NORVASC ) 10 MG tablet Take 1 tablet (10 mg total) by mouth daily. 12/08/23   Dennise Lavada POUR, MD  apixaban  (ELIQUIS ) 5 MG TABS tablet Take 1 tablet (5 mg total) by mouth 2 (two) times daily. 04/28/23   Amin, Ankit C, MD  bisacodyl  (DULCOLAX) 5 MG EC tablet Take 2 tablets (10 mg total) by mouth daily as needed for moderate constipation or severe constipation. 04/28/23   Amin, Ankit C, MD  Cetirizine  HCl 10 MG CAPS Take 10 mg by mouth daily.    [provider]  cyanocobalamin  (VITAMIN B12) 1000 MCG tablet Take 1,000 mcg by mouth daily.    [provider]  diclofenac  Sodium (VOLTAREN ) 1 % GEL Apply 4 g topically 4 (four) times daily. Apply to bilateral knees 04/24/21   [provider]  donepezil  (ARICEPT ) 5 MG tablet Take 1 tablet (5 mg total) by mouth daily. Patient taking differently: Take 5 mg by mouth in the morning. 07/30/23   Wertman, Sara E, PA-C  fluticasone  (FLONASE ) 50 MCG/ACT nasal spray PLACE 1 SPRAY INTO BOTH NOSTRILS 2 (TWO) TIMES DAILY 09/13/22   Shellia Oh, MD  fluticasone -salmeterol (ADVAIR) 250-50 MCG/ACT AEPB Inhale 1 puff into the lungs in the morning and at bedtime.    [provider]  furosemide  (LASIX ) 40 MG tablet Take 1 tablet (40 mg total) by mouth daily as needed. Weigh yourself daily.  Take one dose by mouth if your weight increases by more than 2 pounds in one day or 5 pounds in one week. 09/18/23 09/17/24  Shalhoub, Zachary PARAS, MD  hydrALAZINE  (APRESOLINE ) 50 MG tablet Take 50 mg by mouth every 6 (six) hours. 04/10/23   [provider]  hydrOXYzine (ATARAX) 25 MG tablet Take 25 mg by mouth daily. 02/23/24   [provider]  losartan   (COZAAR ) 50 MG tablet Take 1 tablet (50 mg total) by mouth daily. 12/08/23   Singh, Prashant K, MD  melatonin 3 MG TABS tablet Take 6 mg by mouth at bedtime.    [provider]  Multiple Vitamins-Minerals (CENTRUM SILVER PO) Take 1 tablet by mouth daily.    [provider]  oxybutynin  (DITROPAN -XL) 10 MG 24 hr tablet Take 10 mg by mouth daily. 12/09/22   [provider]  OXYGEN  Inhale 2-3 L/min into the lungs See admin instructions. When using CPAP and night, bleeding O2 3L. Oxygen  via nasal cannula at 2 L/min as needed for shortness of breath or sats < 90%.    [provider]  pantoprazole  (PROTONIX ) 40 MG tablet Take 40 mg by mouth daily.    [provider]  polyethylene glycol (MIRALAX  / GLYCOLAX ) 17 g packet Take 17 g by mouth daily as needed for mild constipation.    [provider]  potassium chloride  (KLOR-CON ) 10 MEQ tablet Take 10 mEq by mouth daily.    [provider]  predniSONE  (DELTASONE ) 5 MG tablet Take by mouth. 03/18/24   [provider]  Propylene Glycol (SYSTANE COMPLETE) 0.6 % SOLN Place 1 drop into both eyes every 6 (six) hours as needed (dry eyes).    [provider]  rosuvastatin  (CRESTOR ) 5 MG tablet Take 5 mg by mouth at bedtime. 12/09/22   [provider]  senna (SENOKOT) 8.6 MG tablet Take 17.2 mg by mouth daily.    [provider]  sertraline  (ZOLOFT ) 25 MG tablet Take 12.5 mg by mouth daily.    [provider]  sodium phosphate  (FLEET) ENEM Place 1 enema rectally once as needed (constipation).    [provider]    Scheduled Meds:  amLODipine   10 mg Oral Daily   Chlorhexidine  Gluconate Cloth  6 each Topical Daily   diclofenac  Sodium  4 g Topical QID   donepezil   5 mg Oral QHS   ferrous sulfate   325 mg Oral Q breakfast   folic acid   2 mg Oral Daily   furosemide   40 mg Intravenous Q12H   hydrALAZINE   50 mg Oral Q6H   losartan   50 mg Oral Daily    melatonin  6 mg Oral QHS   pantoprazole  (PROTONIX ) IV  40 mg Intravenous Q12H   senna  17.2 mg Oral Daily   sertraline   12.5 mg Oral Daily   sodium chloride  flush  3 mL Intravenous Q12H   Continuous Infusions: PRN Meds:.acetaminophen  **OR** acetaminophen , albuterol , bisacodyl , labetalol , ondansetron  **OR** ondansetron  (ZOFRAN ) IV, mouth rinse  Allergies as of 03/27/2024   (No Known Allergies)    Family History  Problem Relation Age of Onset   Diabetes Mother    Heart disease Mother    Cancer Maternal Uncle  Social History   Socioeconomic History   Marital status: Widowed    Spouse name: Not on file   Number of children: 4   Years of education: 86   Highest education level: Not on file  Occupational History   Not on file  Tobacco Use   Smoking status: Former    Current packs/day: 0.00    Types: Cigarettes    Quit date: 01/17/1964    Years since quitting: 60.2   Smokeless tobacco: Never  Vaping Use   Vaping status: Never Used  Substance and Sexual Activity   Alcohol  use: No   Drug use: No   Sexual activity: Not on file  Other Topics Concern   Not on file  Social History Narrative   Right handed   Drinks caffeine   One floor home, son lives with her   Retried   Social Drivers of Health   Financial Resource Strain: Not on file  Food Insecurity: No Food Insecurity (03/28/2024)   Hunger Vital Sign    Worried About Running Out of Food in the Last Year: Never true    Ran Out of Food in the Last Year: Never true  Transportation Needs: No Transportation Needs (03/28/2024)   PRAPARE - Administrator, Civil Service (Medical): No    Lack of Transportation (Non-Medical): No  Physical Activity: Not on file  Stress: Not on file  Social Connections: Patient Declined (03/28/2024)   Social Connection and Isolation Panel    Frequency of Communication with Friends and Family: Patient declined    Frequency of Social Gatherings with Friends and Family: Patient  declined    Attends Religious Services: Patient declined    Database Administrator or Organizations: Patient declined    Attends Banker Meetings: Patient declined    Marital Status: Patient declined  Intimate Partner Violence: Unknown (03/28/2024)   Humiliation, Afraid, Rape, and Kick questionnaire    Fear of Current or Ex-Partner: Patient declined    Emotionally Abused: No    Physically Abused: No    Sexually Abused: No    Review of Systems: All negative except as stated above in HPI.  Physical Exam: Vital signs: Vitals:   03/29/24 0600 03/29/24 0733  BP: (!) 120/38   Pulse: (!) 55   Resp: 16   Temp:  97.6 F (36.4 C)  SpO2: 100%    Last BM Date : 03/28/24 General:   Elderly patient, resting comfortably, not in acute distress Lungs: Decreased breath sounds bilaterally Heart:  Regular rate and rhythm; no murmurs, clicks, rubs,  or gallops. Abdomen: Soft, nontender, nondistended, bowel sound present, no peritoneal signs Rectal:  Deferred  GI:  Lab Results: Recent Labs    03/27/24 1526 03/28/24 0309 03/28/24 1501 03/29/24 0251  WBC 5.6 4.5  --  4.7  HGB 7.7* 6.7* 8.1* 8.0*  HCT 28.2* 24.0* 26.9* 27.2*  PLT 195 170  --  182   BMET Recent Labs    03/27/24 1526 03/28/24 0309 03/29/24 0251  NA 138 140 140  K 5.1 4.7 4.3  CL 96* 97* 94*  CO2 38* 36* 42*  GLUCOSE 90 66* 74  BUN 31* 30* 23  CREATININE 1.00 0.82 0.69  CALCIUM  9.6 9.5 9.4   LFT Recent Labs    03/27/24 1526  PROT 6.6  ALBUMIN 3.7  AST <10*  ALT 8  ALKPHOS 48  BILITOT 0.2   PT/INR No results for input(s): LABPROT, INR in the last 72 hours.  Studies/Results: DG Chest Port 1 View Result Date: 03/27/2024 EXAM: 1 VIEW(S) XRAY OF THE CHEST 03/27/2024 04:03:00 PM COMPARISON: 02/01/2024 CLINICAL HISTORY: dyspnea FINDINGS: LUNGS AND PLEURA: Probable bilateral pulmonary edema is noted with associated pleural effusions left greater than right. No pneumothorax. HEART AND  MEDIASTINUM: Stable cardiomegaly. BONES AND SOFT TISSUES: No acute osseous abnormality. IMPRESSION: 1. Stable cardiomegaly. 2. Probable bilateral pulmonary edema with associated pleural effusions, left greater than right. Electronically signed by: Lynwood Seip MD 03/27/2024 04:18 PM EST RP Workstation: [id number]   CT HEAD WO CONTRAST Result Date: 03/27/2024 EXAM: CT HEAD WITHOUT CONTRAST 03/27/2024 04:08:30 PM TECHNIQUE: CT of the head was performed without the administration of intravenous contrast. Automated exposure control, iterative reconstruction, and/or weight based adjustment of the mA/kV was utilized to reduce the radiation dose to as low as reasonably achievable. COMPARISON: None available. CLINICAL HISTORY: Mental status change, unknown cause. FINDINGS: BRAIN AND VENTRICLES: No acute hemorrhage. No evidence of acute infarct. No hydrocephalus. No extra-axial collection. No mass effect or midline shift. Mild atrophy and white matter changes are stable. ORBITS: No acute abnormality. SINUSES: Chronic right maxillary sinus opacification is again noted. Heterogeneous soft tissue extends into the right nasal cavity. SOFT TISSUES AND SKULL: No acute soft tissue abnormality. No skull fracture. VASCULATURE: Atherosclerotic calcifications are present in the cavernous carotid arteries bilaterally and at the dural margin of both vertebral arteries. No hyperdense vessel is present. IMPRESSION: 1. No acute intracranial abnormality. 2. Mild atrophy and white matter changes are stable. Electronically signed by: Lonni Necessary MD 03/27/2024 04:16 PM EST RP Workstation: [id number]    Impression/Plan: -Anemia and heme positive stool without any overt bleeding in a patient with history of paroxysmal atrial fibrillation was on Eliquis .  Intermittent anemia since 2023. - Acute hypoxic respiratory failure - Chronic respiratory failure - CHF - Other medical comorbidities listed  above  Recommendations ------------------------- - Patient is 83 year old with acute on chronic respiratory failure.  No overt bleeding. - Recommend to continue supportive care for now with PPI.  Recommend to hold Eliquis  for 48 hours and resume once hemoglobin is stable. - Patient also prefers not to have any invasive/endoscopic procedure at this time. - GI will follow.  I have called and discussed with patient's daughter.  She is in agreement that they do not want any endoscopic intervention at this time but if she keeps having recurrent anemia then they will think about it.   LOS: 2 days   Layla Lah  MD, FACP 03/29/2024, 8:45 AM  Contact #  [phone]

## 2024-03-29 NOTE — Progress Notes (Signed)
 PT Cancellation Note  Patient Details Name: Elizabeth [name] MRN: [id number] DOB: 1940-08-29   Cancelled Treatment:    Reason Eval/Treat Not Completed: Medical issues which prohibited therapy  Spoke with nurse and pt was very lethargic and just placed back on BiPAP.  Will f/u later date.  Magdalina Whitehead, PT Acute Rehab [facility] [phone]  Elizabeth [name] [name] 03/29/2024, 4:33 PM

## 2024-03-29 NOTE — Plan of Care (Incomplete)
  Problem: Education: Goal: Knowledge of General Education information will improve Description: Including pain rating scale, medication(s)/side effects and non-pharmacologic comfort measures Outcome: Progressing   Problem: Health Behavior/Discharge Planning: Goal: Ability to manage health-related needs will improve Outcome: Progressing   Problem: Clinical Measurements: Goal: Ability to maintain clinical measurements within normal limits will improve Outcome: Progressing Goal: Will remain free from infection Outcome: Progressing Goal: Diagnostic test results will improve Outcome: Progressing Goal: Respiratory complications will improve Outcome: Progressing Goal: Cardiovascular complication will be avoided Outcome: Progressing   Problem: Activity: Goal: Risk for activity intolerance will decrease Outcome: Progressing   Problem: Nutrition: Goal: Adequate nutrition will be maintained Outcome: Progressing   Problem: Coping: Goal: Level of anxiety will decrease Outcome: Progressing   Problem: Elimination: Goal: Will not experience complications related to bowel motility Outcome: Progressing Goal: Will not experience complications related to urinary retention Outcome: Progressing   Problem: Pain Managment: Goal: General experience of comfort will improve and/or be controlled Outcome: Progressing   Problem: Safety: Goal: Ability to remain free from injury will improve Outcome: Progressing   Problem: Skin Integrity: Goal: Risk for impaired skin integrity will decrease Outcome: Progressing   Problem: Education: Goal: Individualized Educational Video(s) Outcome: Progressing   Problem: Coping: Goal: Ability to adjust to condition or change in health will improve Outcome: Progressing   Problem: Fluid Volume: Goal: Ability to maintain a balanced intake and output will improve Outcome: Progressing   Problem: Health Behavior/Discharge Planning: Goal: Ability to manage  health-related needs will improve Outcome: Progressing   Problem: Metabolic: Goal: Ability to maintain appropriate glucose levels will improve Outcome: Progressing   Problem: Nutritional: Goal: Maintenance of adequate nutrition will improve Outcome: Progressing   Problem: Skin Integrity: Goal: Risk for impaired skin integrity will decrease Outcome: Progressing   Problem: Tissue Perfusion: Goal: Adequacy of tissue perfusion will improve Outcome: Progressing   Problem: Education: Goal: Individualized Educational Video(s) Outcome: Progressing   Problem: Activity: Goal: Capacity to carry out activities will improve Outcome: Progressing   Problem: Cardiac: Goal: Ability to achieve and maintain adequate cardiopulmonary perfusion will improve Outcome: Progressing   Problem: Education: Goal: Knowledge of the prescribed therapeutic regimen will improve Outcome: Progressing   Problem: Activity: Goal: Ability to tolerate increased activity will improve Outcome: Progressing   Problem: Respiratory: Goal: Ability to maintain a clear airway will improve Outcome: Progressing Goal: Ability to maintain adequate ventilation will improve Outcome: Progressing

## 2024-03-29 NOTE — [place] FL2 (Signed)
 [place]  MEDICAID FL2 LEVEL OF CARE FORM     IDENTIFICATION  Patient Name: Elizabeth [name] Birthdate: December 08, 1940 Sex: female Admission Date (Current Location): 03/27/2024  [facility] and [place] Number:  Producer, Television/film/video and Address:  [facility],  [address], [place] [address]      Provider Number: [id number]  Attending Physician Name and Address:  Jillian Buttery, MD  Relative Name and Phone Number:  Ratliff(POA), [name]  [phone]    Current Level of Care: Hospital Recommended Level of Care: Nursing Facility Prior Approval Number:    Date Approved/Denied:   PASRR Number:    Discharge Plan: SNF (LTC Facility)    Current Diagnoses: Patient Active Problem List   Diagnosis Date Noted   Acute on chronic respiratory failure with hypercapnia (HCC) 02/02/2024   Hyperkalemia 02/02/2024   Acute respiratory failure with hypoxia and hypercarbia (HCC) 02/01/2024   Normocytic anemia 02/01/2024   Acute hypercapnic respiratory failure (HCC) 01/23/2024   Obesity hypoventilation syndrome (HCC) 01/23/2024   Acute respiratory failure with hypoxia and hypercapnia (HCC) 12/05/2023   Pressure injury of skin 12/05/2023   SIRS (systemic inflammatory response syndrome) (HCC) 12/05/2023   Renal insufficiency 12/05/2023   Macrocytic anemia 12/05/2023   Paroxysmal atrial fibrillation (HCC) 12/05/2023   Memory impairment 12/05/2023   Acute respiratory failure with hypercapnia (HCC) 09/17/2023   Hypomagnesemia 09/15/2023   Hypophosphatemia 09/15/2023   Acute metabolic encephalopathy 09/14/2023   Reactive airway disease with acute exacerbation 09/14/2023   Chronic diastolic CHF (congestive heart failure) (HCC) 09/14/2023   Colitis 04/24/2023   Sepsis (HCC) 04/23/2023   (HFpEF) heart failure with preserved ejection fraction (HCC) 12/24/2022   Morbid obesity (HCC) 12/24/2022   OSA (obstructive sleep apnea) 03/28/2022   Diabetes mellitus without complication  (HCC) 06/19/2021   Acute kidney injury 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 05/12/2021   COVID-19 virus infection 05/12/2021   Generalized weakness 05/12/2021   Essential hypertension 05/12/2021   Asthma 05/12/2021   GERD (gastroesophageal reflux disease)    Leukopenia    Chronic venous insufficiency 01/17/2017   Pain of left breast 01/17/2012    Orientation RESPIRATION BLADDER Height & Weight     Self, Time, Situation, Place  O2, Other (Comment) (2L [place] O2, BIPAP) Incontinent Weight: 110.3 kg Height:  5' 2.01 (157.5 cm)  BEHAVIORAL SYMPTOMS/MOOD NEUROLOGICAL BOWEL NUTRITION STATUS      Continent Diet (Heart Healthy, Thin Liquids)  AMBULATORY STATUS COMMUNICATION OF NEEDS Skin   Extensive Assist Verbally PU Stage and Appropriate Care (Pressure Injury Sacrum Medial Stage 2)                       Personal Care Assistance Level of Assistance  Bathing, Feeding, Dressing Bathing Assistance: Maximum assistance Feeding assistance: Limited assistance Dressing Assistance: Maximum assistance     Functional Limitations Info  Sight, Hearing, Speech Sight Info: Impaired Hearing Info: Adequate Speech Info: Adequate    SPECIAL CARE FACTORS FREQUENCY                       Contractures Contractures Info: Not present    Additional Factors Info  Code Status, Allergies Code Status Info: DNR Allergies Info: No Known Allergies           Current Medications (03/29/2024):  This is the current hospital active medication list Current Facility-Administered Medications  Medication Dose Route Frequency Provider Last Rate Last Admin   acetaminophen  (TYLENOL ) tablet 650 mg  650  mg Oral Q6H PRN Arthea Child, MD       Or   acetaminophen  (TYLENOL ) suppository 650 mg  650 mg Rectal Q6H PRN Arthea Child, MD       albuterol  (PROVENTIL ) (2.5 MG/3ML) 0.083% nebulizer solution 2.5 mg  2.5 mg Nebulization Q2H PRN Arthea Child, MD       amLODipine  (NORVASC ) tablet 10 mg   10 mg Oral Daily Jillian Buttery, MD   10 mg at 03/28/24 1010   bisacodyl  (DULCOLAX) EC tablet 5 mg  5 mg Oral Daily PRN Claiborne, Claudia, MD       Chlorhexidine  Gluconate Cloth 2 % PADS 6 each  6 each Topical Daily Claiborne, Claudia, MD   6 each at 03/28/24 2200   diclofenac  Sodium (VOLTAREN ) 1 % topical gel 4 g  4 g Topical QID Claiborne, Claudia, MD   4 g at 03/29/24 1029   donepezil  (ARICEPT ) tablet 5 mg  5 mg Oral QHS Claiborne, Claudia, MD   5 mg at 03/28/24 2131   ferrous sulfate  tablet 325 mg  325 mg Oral Q breakfast Jillian Buttery, MD   325 mg at 03/29/24 0841   folic acid  (FOLVITE ) tablet 2 mg  2 mg Oral Daily Adhikari, Amrit, MD   2 mg at 03/29/24 1018   furosemide  (LASIX ) injection 40 mg  40 mg Intravenous Q12H Adhikari, Buttery, MD   40 mg at 03/29/24 1025   hydrALAZINE  (APRESOLINE ) tablet 50 mg  50 mg Oral Q6H Adhikari, Amrit, MD   50 mg at 03/29/24 0616   labetalol  (NORMODYNE ) injection 10 mg  10 mg Intravenous Q2H PRN Jillian Buttery, MD       losartan  (COZAAR ) tablet 50 mg  50 mg Oral Daily Adhikari, Amrit, MD   50 mg at 03/28/24 1010   melatonin tablet 6 mg  6 mg Oral QHS Claiborne, Claudia, MD   6 mg at 03/28/24 2131   ondansetron  (ZOFRAN ) tablet 4 mg  4 mg Oral Q6H PRN Arthea Child, MD       Or   ondansetron  (ZOFRAN ) injection 4 mg  4 mg Intravenous Q6H PRN Claiborne, Claudia, MD       Oral care mouth rinse  15 mL Mouth Rinse PRN Jillian Buttery, MD       pantoprazole  (PROTONIX ) injection 40 mg  40 mg Intravenous Q12H Adhikari, Buttery, MD   40 mg at 03/29/24 1022   polyethylene glycol (MIRALAX  / GLYCOLAX ) packet 17 g  17 g Oral Once Brahmbhatt, Parag, MD       senna (SENOKOT) tablet 17.2 mg  17.2 mg Oral Daily Claiborne, Claudia, MD   17.2 mg at 03/28/24 1005   sertraline  (ZOLOFT ) tablet 12.5 mg  12.5 mg Oral Daily Claiborne, Claudia, MD   12.5 mg at 03/29/24 1020   sodium chloride  flush (NS) 0.9 % injection 3 mL  3 mL Intravenous Q12H Arthea Child, MD   3 mL at  03/29/24 1033     Discharge Medications: Please see discharge summary for a list of discharge medications.  Relevant Imaging Results:  Relevant Lab Results:   Additional Information SSN: [id number]  Jon ONEIDA Anon, RN

## 2024-03-29 NOTE — TOC Initial Note (Signed)
 Transition of Care [facility]) - Initial/Assessment Note    Patient Details  Name: Elizabeth [name] MRN: [id number] Date of Birth: 06-28-1940  Transition of Care [facility]) CM/SW Contact:    Jon ONEIDA Anon, RN Phone Number: 03/29/2024, 3:20 PM  Clinical Narrative:                 Pt is from [facility] as a long term care resident. Pt brought in via EMS due to SOB, nausea, and lethargy. Pt needing continued medical workup, not medically ready to DC. Pt requiring Bipap. Pt consulted. Pt will transport via [facility]. Will continue to follow for any DC planning needs.     Expected Discharge Plan: Long Term Nursing Home Barriers to Discharge: Continued Medical Work up   Patient Goals and CMS Choice Patient states their goals for this hospitalization and ongoing recovery are:: Return to LTC CMS Medicare.gov Compare Post Acute Care list provided to:: Patient Represenative (must comment) (Pt daughter) Choice offered to / list presented to : [facility] POA / Guardian [place] ownership interest in [facility].provided to:: [facility] POA / Guardian    Expected Discharge Plan and Services In-house Referral: Hospice / Palliative Care Discharge Planning Services: CM Consult Post Acute Care Choice: Nursing Home Living arrangements for the past 2 months: Skilled Nursing Facility                 DME Arranged: N/A DME Agency: NA       HH Arranged: NA HH Agency: NA        Prior Living Arrangements/Services Living arrangements for the past 2 months: Skilled Nursing Facility Lives with:: Facility Resident Patient language and need for interpreter reviewed:: Yes Do you feel safe going back to the place where you live?: Yes      Need for Family Participation in Patient Care: Yes (Comment) Care giver support system in place?: Yes (comment) Current home services: DME, Hospice Criminal Activity/Legal Involvement Pertinent to Current Situation/Hospitalization: No - Comment as needed  Activities of  Daily Living   ADL Screening (condition at time of admission) Independently performs ADLs?: No Does the patient have a NEW difficulty with bathing/dressing/toileting/self-feeding that is expected to last >3 days?: Yes (Initiates electronic notice to provider for possible OT consult) Does the patient have a NEW difficulty with getting in/out of bed, walking, or climbing stairs that is expected to last >3 days?: Yes (Initiates electronic notice to provider for possible PT consult) Does the patient have a NEW difficulty with communication that is expected to last >3 days?: No Is the patient deaf or have difficulty hearing?: Yes Does the patient have difficulty seeing, even when wearing glasses/contacts?: No Does the patient have difficulty concentrating, remembering, or making decisions?: Yes  Permission Sought/Granted Permission sought to share information with : Family Supports, Magazine Features Editor    Share Information with NAME: Cortina(POA), Duayne (Daughter)  [phone]  Permission granted to share info w AGENCY: [facility]        Emotional Assessment Appearance:: Appears stated age Attitude/Demeanor/Rapport: Unable to Assess Affect (typically observed): Unable to Assess Orientation: : Oriented to Self, Oriented to Place, Oriented to  Time, Oriented to Situation Alcohol  / Substance Use: Not Applicable Psych Involvement: No (comment)  Admission diagnosis:  Acute respiratory acidosis (HCC) [J96.02] Acute on chronic respiratory failure with hypercapnia (HCC) [J96.22] Acute on chronic congestive heart failure, unspecified heart failure type [facility]) [I50.9] Patient Active Problem List   Diagnosis Date Noted   Acute on chronic  respiratory failure with hypercapnia (HCC) 02/02/2024   Hyperkalemia 02/02/2024   Acute respiratory failure with hypoxia and hypercarbia (HCC) 02/01/2024   Normocytic anemia 02/01/2024   Acute hypercapnic respiratory failure (HCC) 01/23/2024    Obesity hypoventilation syndrome (HCC) 01/23/2024   Acute respiratory failure with hypoxia and hypercapnia (HCC) 12/05/2023   Pressure injury of skin 12/05/2023   SIRS (systemic inflammatory response syndrome) (HCC) 12/05/2023   Renal insufficiency 12/05/2023   Macrocytic anemia 12/05/2023   Paroxysmal atrial fibrillation (HCC) 12/05/2023   Memory impairment 12/05/2023   Acute respiratory failure with hypercapnia (HCC) 09/17/2023   Hypomagnesemia 09/15/2023   Hypophosphatemia 09/15/2023   Acute metabolic encephalopathy 09/14/2023   Reactive airway disease with acute exacerbation 09/14/2023   Chronic diastolic CHF (congestive heart failure) (HCC) 09/14/2023   Colitis 04/24/2023   Sepsis (HCC) 04/23/2023   (HFpEF) heart failure with preserved ejection fraction (HCC) 12/24/2022   Morbid obesity (HCC) 12/24/2022   OSA (obstructive sleep apnea) 03/28/2022   Diabetes mellitus without complication (HCC) 06/19/2021   Acute kidney injury 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 05/12/2021   COVID-19 virus infection 05/12/2021   Generalized weakness 05/12/2021   Essential hypertension 05/12/2021   Asthma 05/12/2021   GERD (gastroesophageal reflux disease)    Leukopenia    Chronic venous insufficiency 01/17/2017   Pain of left breast 01/17/2012   PCP:  Albina GORMAN Dine, MD Pharmacy:  No Pharmacies Listed    Social Drivers of Health (SDOH) Social History: SDOH Screenings   Food Insecurity: No Food Insecurity (03/28/2024)  Housing: Low Risk  (03/28/2024)  Transportation Needs: No Transportation Needs (03/28/2024)  Utilities: Not At Risk (03/28/2024)  Social Connections: Patient Declined (03/28/2024)  Tobacco Use: Medium Risk (03/27/2024)   SDOH Interventions:     Readmission Risk Interventions    03/29/2024   11:20 AM 01/25/2024    3:44 PM 04/28/2023   11:56 AM  Readmission Risk Prevention Plan  Transportation Screening Complete Complete Complete  PCP or Specialist Appt within 5-7  Days   Complete  PCP or Specialist Appt within 3-5 Days  Complete   Home Care Screening   Complete  Medication Review (RN CM)   Complete  HRI or Home Care Consult  Complete   Social Work Consult for Recovery Care Planning/Counseling  Complete   Palliative Care Screening  Not Applicable   Medication Review Oceanographer) Complete Complete   PCP or Specialist appointment within 3-5 days of discharge Complete    HRI or Home Care Consult Complete    SW Recovery Care/Counseling Consult Complete    Palliative Care Screening Complete    Skilled Nursing Facility Complete

## 2024-03-29 NOTE — Progress Notes (Signed)
   03/29/24 0426  BiPAP/CPAP/SIPAP  $ Non-Invasive Ventilator  Non-Invasive Vent Subsequent  BiPAP/CPAP/SIPAP Pt Type Adult  BiPAP/CPAP/SIPAP V60  Mask Type Full face mask  Dentures removed? Not applicable  Mask Size Medium  Set Rate 16 breaths/min  Respiratory Rate 16 breaths/min  IPAP 18 cmH20  EPAP 8 cmH2O  FiO2 (%) 35 %  Flow Rate 0 lpm  Minute Ventilation 13.5  Leak 3  Peak Inspiratory Pressure (PIP) 19  Tidal Volume (Vt) 836  Patient Home Machine No  Patient Home Mask No  Patient Home Tubing No  Auto Titrate No  Press High Alarm 30 cmH2O  Press Low Alarm 5 cmH2O  BiPAP/CPAP /SiPAP Vitals  Pulse Rate (!) 56  Resp 15  SpO2 98 %  MEWS Score/Color  MEWS Score 0  MEWS Score Color Green

## 2024-03-29 NOTE — Progress Notes (Signed)
   03/29/24 2304  BiPAP/CPAP/SIPAP  BiPAP/CPAP/SIPAP Pt Type Adult  BiPAP/CPAP/SIPAP V60  Mask Type Full face mask  Dentures removed? Not applicable  Mask Size Medium  Set Rate 16 breaths/min  Respiratory Rate 18 breaths/min  IPAP 18 cmH20  EPAP 8 cmH2O  FiO2 (%) 35 %  Minute Ventilation 5  Leak 3  Peak Inspiratory Pressure (PIP) 18  Tidal Volume (Vt) 300  Patient Home Machine No  Patient Home Mask No  Patient Home Tubing No  Auto Titrate No  Press High Alarm 30 cmH2O  Press Low Alarm 5 cmH2O  Device Plugged into RED Power Outlet Yes  Oxygen  Percent 35 %  BiPAP/CPAP /SiPAP Vitals  Bilateral Breath Sounds Clear;Diminished

## 2024-03-30 LAB — BASIC METABOLIC PANEL WITH GFR
Anion gap: 3 — ABNORMAL LOW (ref 5–15)
BUN: 22 mg/dL (ref 8–23)
CO2: 44 mmol/L — ABNORMAL HIGH (ref 22–32)
Calcium: 9.2 mg/dL (ref 8.9–10.3)
Chloride: 91 mmol/L — ABNORMAL LOW (ref 98–111)
Creatinine, Ser: 0.68 mg/dL (ref 0.44–1.00)
GFR, Estimated: >60 mL/min (ref >60)
Glucose, Bld: 76 mg/dL (ref 70–99)
Potassium: 3.9 mmol/L (ref 3.5–5.1)
Sodium: 138 mmol/L (ref 135–145)

## 2024-03-30 LAB — CBC
HCT: 26.8 % — ABNORMAL LOW (ref 36.0–46.0)
Hemoglobin: 8 g/dL — ABNORMAL LOW (ref 12.0–15.0)
MCH: 28.7 pg (ref 26.0–34.0)
MCHC: 29.9 g/dL — ABNORMAL LOW (ref 30.0–36.0)
MCV: 96.1 fL (ref 80.0–100.0)
Platelets: 173 K/uL (ref 150–400)
RBC: 2.79 MIL/uL — ABNORMAL LOW (ref 3.87–5.11)
RDW: 17.8 % — ABNORMAL HIGH (ref 11.5–15.5)
WBC: 4.9 K/uL (ref 4.0–10.5)
nRBC: 0 % (ref 0.0–0.2)

## 2024-03-30 LAB — GLUCOSE, CAPILLARY
Glucose-Capillary: 78 mg/dL (ref 70–99)
Glucose-Capillary: 84 mg/dL (ref 70–99)
Glucose-Capillary: 109 mg/dL — ABNORMAL HIGH (ref 70–99)
Glucose-Capillary: 93 mg/dL (ref 70–99)

## 2024-03-30 MED ORDER — FUROSEMIDE 10 MG/ML IJ SOLN
40.0000 mg | Freq: Once | INTRAMUSCULAR | Status: AC
  Administered 2024-03-30: 40 mg via INTRAVENOUS
  Filled 2024-03-30: qty 4

## 2024-03-30 MED ORDER — POLYETHYLENE GLYCOL 3350 17 G PO PACK
17.0000 g | PACK | Freq: Once | ORAL | Status: AC
  Administered 2024-03-30: 17 g via ORAL
  Filled 2024-03-30: qty 1

## 2024-03-30 MED ORDER — PANTOPRAZOLE SODIUM 40 MG PO TBEC
40.0000 mg | DELAYED_RELEASE_TABLET | Freq: Every day | ORAL | Status: DC
  Administered 2024-03-31: 40 mg via ORAL
  Filled 2024-03-30: qty 1

## 2024-03-30 NOTE — Plan of Care (Signed)
   Problem: Education: Goal: Knowledge of General Education information will improve Description: Including pain rating scale, medication(s)/side effects and non-pharmacologic comfort measures Outcome: Progressing   Problem: Activity: Goal: Risk for activity intolerance will decrease Outcome: Progressing   Problem: Nutrition: Goal: Adequate nutrition will be maintained Outcome: Progressing

## 2024-03-30 NOTE — Progress Notes (Signed)
 RT placed pt on bipap for the night, 5 minutes later pt stated her stomach started to hurt and she felt like she was going to vomit. RT placed pt back on [place]. Made RN aware.  03/30/24 2317  BiPAP/CPAP/SIPAP  BiPAP/CPAP/SIPAP Pt Type Adult  BiPAP/CPAP/SIPAP V60  Reason BIPAP/CPAP not in use Nausea/Vomiting

## 2024-03-30 NOTE — Progress Notes (Signed)
 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  She was recently hospitalized in October for hypercarbia because she refused to wear her BiPAP.  She was discharged on auto BiPAP and oxygen .  She was found to be lethargic than usual and speaking less.  She was not using her BiPAP.  On presentation, venous blood gas showed pH of 7.16, pCO2 of 114.  Put on BiPAP.  Lab work showed elevated BNP of 2862, hemoglobin 7.7.  CT head did not show any acute intracranial abnormalities.  Chest x-ray showed bilateral pulm edema with bilateral pleural effusions left greater than right.  Started on IV Lasix .  Significant improvement in the respiratory status now.  Continue IV Lasix  for 1 more day.  Possible discharge back to SNF tomorrow.  Assessment & Plan:  Principal Problem:   Acute on chronic respiratory failure with hypercapnia (HCC) Active Problems:   Diabetes mellitus without complication (HCC)   OSA (obstructive sleep apnea)   Morbid obesity (HCC)   Obesity hypoventilation syndrome (HCC)  Acute on chronic hypoxic/hypercarbic respiratory failure: Secondary to OHS/OSA.  Uses BiPAP at night.  Uses 2 L of oxygen  at baseline.  Recently admitted for the same.  Noncompliant with BiPAP.  pCO2 was elevated on presentation.  Put on BiPAP here.  Uses supplemental oxygen  at baseline. On albuterol , cetirizine , Flonase , Advair at home. ABG done on 12/7 showed improvement in the pCO2, normal pH.  Will continue BiPAP at night only.  Currently back on 2 L of oxygen . She needs  to continue BiPAP at night at SNF  Chronic diastolic CHF: Elevated proBNP.  Takes oral Lasix  at home.  Monitor volume status.  Monitor  daily weight, input/output.  Currently on IV Lasix  at 40 mg twice daily.  Had significant bilateral lower extremity edema which looks improved.  Continue IV Lasix  just for today.  Change to oral Lasix  from tomorrow  Macrocytic anemia/GI bleed/positive FOBT: Baseline hemoglobin around 7-8.  No report of hematochezia or melena.  Recent vitamin B12 was more than normal but folic acid  was low at 4.8.  Continue folic acid  supplementation.  She  was given a unit of PRBC.  Positive check FOBT.  Iron panel about a month ago showed low iron, but repeat iron level shows optimal iron.  Continue oral iron supplementation.  Currently on IV Protonix .  GI saw her here, no plan for EGD or colonoscopy.  Recommended to restart Eliquis  in 48 hours.  Will resume tomorrow.  Hypertension: On amlodipine , hydralazine , losartan .  Continue the same.  Paroxysmal A-fib: Currently she is on normal sinus rhythm.  On Eliquis  for anticoagulation.  She is to discuss with her cardiologist as an outpatient to discuss about continuing  Eliquis  or  not  because of occult GI bleed  Hyperlipidemia: On statin  Mood disorder: On donepezil , sertraline   Morbid obesity: BMI of 45.3  Goals of care: Elderly patient with multiple comorbidities, frequent readmissions.  Noncompliant with BiPAP.Patient is enrolled with outpatient hospice.  She is nonambulatory at baseline.  Daughter wants to cancel her enrollment with hospice now.  She want to be intubated if needed but no chest compressions.     Wound 03/27/24 2111 Pressure Injury Sacrum Medial Stage 2 -  Partial thickness loss of dermis presenting as a shallow open injury with a red, pink wound bed without slough. (Active)    DVT prophylaxis:     Code Status: Do not attempt resuscitation (DNR) PRE-ARREST INTERVENTIONS DESIRED  Family Communication:Called and discussed with daughter Duayne on phone on 12/9  Patient status:Inpatient  Patient is from :SnF  Anticipated discharge  to:SNF  Estimated DC date:tomorrow   Consultants: GI   Procedures:None  Antimicrobials:  Anti-infectives (From admission, onward)    None       Subjective: Patient seen and examined at bedside today.  Hemodynamically stable.  Overall comfortable.  Lying in bed.  On 2 L of oxygen  per minute.  Lower extremity edema improving.  No new issues.  I discussed with her daughter on phone today and possible discharge to SNF tomorrow  Objective: Vitals:   03/30/24 0740 03/30/24 0800 03/30/24 0900 03/30/24 0913  BP:  (!) 168/44  (!) 157/46  Pulse:  65 61 60  Resp:  16 17 19   Temp: (!) 97.3 F (36.3 C)     TempSrc: Oral     SpO2:  100% 99% 96%  Weight:      Height:        Intake/Output Summary (Last 24 hours) at 03/30/2024 1003 Last data filed at 03/30/2024 0700 Gross per 24 hour  Intake --  Output 2175 ml  Net -2175 ml   Filed Weights   03/27/24 1447 03/27/24 2100 03/29/24 0600  Weight: 108.9 kg 112.5 kg 110.3 kg    Examination:   General exam: Overall comfortable, not in distress,obese HEENT: PERRL Respiratory system: Mildly  diminished sounds on bilateral bases cardiovascular system: S1 & S2 heard, RRR.  Gastrointestinal system: Abdomen is nondistended, soft and nontender. Central nervous system: Alert and oriented Extremities: Improving bilateral lower extremity pitting edema, no clubbing ,no cyanosis Skin: No rashes, no ulcers,no icterus     Data Reviewed: I have personally reviewed following labs and imaging studies  CBC: Recent Labs  Lab 03/27/24 1526 03/28/24 0309 03/28/24 1501 03/29/24 0251 03/30/24 0316  WBC 5.6 4.5  --  4.7 4.9  NEUTROABS 2.4  --   --   --   --   HGB 7.7* 6.7* 8.1* 8.0* 8.0*  HCT 28.2* 24.0* 26.9* 27.2* 26.8*  MCV 107.6* 103.9*  --  95.8 96.1  PLT 195 170  --  182 173   Basic Metabolic Panel: Recent Labs  Lab 03/27/24 1526 03/28/24 0309 03/29/24 0251 03/30/24 0316  NA 138 140 140 138  K 5.1 4.7 4.3 3.9  CL 96* 97* 94*  91*  CO2 38* 36* 42* 44*  GLUCOSE 90 66* 74 76  BUN 31* 30* 23 22  CREATININE 1.00 0.82 0.69 0.68  CALCIUM  9.6 9.5 9.4 9.2  MG  --  1.7  --   --      Recent Results (from the past 240 hours)  MRSA Next Gen by PCR, Nasal     Status: None   Collection Time: 03/27/24  8:32 PM   Specimen: Nasal Mucosa; Nasal Swab  Result Value Ref Range Status   MRSA by PCR Next Gen NOT DETECTED NOT DETECTED Final    Comment: (NOTE) The GeneXpert MRSA Assay (FDA approved for NASAL specimens only), is one component of a comprehensive MRSA colonization surveillance program. It is not intended to diagnose MRSA infection nor to guide or monitor treatment for MRSA infections. Test performance is not FDA approved in patients less than 2 years  old. Performed at [facility], [address], [place], [place] [address]      Radiology Studies: DG Shoulder Right Result Date: 03/29/2024 CLINICAL DATA:  [id number] Shoulder pain, right [phone] EXAM: RIGHT SHOULDER - 2+ VIEW COMPARISON:  Right shoulder radiographs 07/25/2016. FINDINGS: The mineralization and alignment are normal. There is no evidence of acute fracture or dislocation. 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 Similar mild-to-moderate acromioclavicular degenerative changes. 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 No acute osseous findings. Electronically Signed   By: Elsie Perone M.D.   On: 03/29/2024 17:59    Scheduled Meds:  amLODipine   10 mg Oral Daily   Chlorhexidine  Gluconate Cloth  6 each Topical Daily   diclofenac  Sodium  4 g Topical QID   donepezil   5 mg Oral QHS   ferrous sulfate   325 mg Oral Q breakfast   folic acid   2 mg Oral Daily   furosemide   40 mg Intravenous Once   hydrALAZINE   50 mg Oral Q6H   losartan   50 mg Oral Daily   melatonin  6 mg Oral QHS   pantoprazole  (PROTONIX ) IV  40 mg Intravenous Q12H   polyethylene  glycol  17 g Oral Once   senna  17.2 mg Oral Daily   sertraline   12.5 mg Oral Daily   sodium chloride  flush  3 mL Intravenous Q12H   Continuous Infusions:   LOS: 3 days   Ivonne Mustache, MD [facility] P12/12/2023, 10:03 AM

## 2024-03-30 NOTE — Progress Notes (Signed)
 [facility] Gastroenterology Progress Note  JENAH VANASTEN 83 y.o. Mar 03, 1941  CC: Anemia, heme positive stool   Subjective: Patient seen and examined at bedside.  She was complaining of rectal pain.  Denies seeing any blood in the stool or black stool.  ROS : Resting comfortably, not in acute distress   Objective: Vital signs in last 24 hours: Vitals:   03/30/24 0913 03/30/24 1000  BP: (!) 157/46 (!) 137/39  Pulse: 60 (!) 56  Resp: 19 15  Temp:    SpO2: 96% 100%    Physical Exam: Elderly patient, resting comfortably.  Not in acute distress.  Abdominal exam benign.  Rectal exam was performed in presence of RN and student.  Rectal exam showed brown stool around perianal area, also showed soft stool in the rectum, no evidence of mass lesion.    Lab Results: Recent Labs    03/28/24 0309 03/29/24 0251 03/30/24 0316  NA 140 140 138  K 4.7 4.3 3.9  CL 97* 94* 91*  CO2 36* 42* 44*  GLUCOSE 66* 74 76  BUN 30* 23 22  CREATININE 0.82 0.69 0.68  CALCIUM  9.5 9.4 9.2  MG 1.7  --   --    Recent Labs    03/27/24 1526  AST <10*  ALT 8  ALKPHOS 48  BILITOT 0.2  PROT 6.6  ALBUMIN 3.7   Recent Labs    03/27/24 1526 03/28/24 0309 03/29/24 0251 03/30/24 0316  WBC 5.6   < > 4.7 4.9  NEUTROABS 2.4  --   --   --   HGB 7.7*   < > 8.0* 8.0*  HCT 28.2*   < > 27.2* 26.8*  MCV 107.6*   < > 95.8 96.1  PLT 195   < > 182 173   < > = values in this interval not displayed.   No results for input(s): LABPROT, INR in the last 72 hours.    Assessment/Plan: -Anemia and heme positive stool without any overt bleeding in a patient with history of paroxysmal atrial fibrillation was on Eliquis .  Intermittent anemia since 2023. - Acute hypoxic respiratory failure - Chronic respiratory failure - CHF - Other medical comorbidities listed above   Recommendations ------------------------- - Rectal exam in presence of RN showed brown stool.  No rectal mass. -No plan for any endoscopic  intervention at this time.  This was discussed with patient's daughter yesterday and they are in agreement.  They will consider endoscopic intervention if there is recurrent bleeding or recurrent drop in hemoglobin. -Recommend to continue Protonix  40 mg once a day for 4 weeks -Okay to resume anticoagulation/Eliquis  tomorrow if hemoglobin stable -GI will sign off.  Call us  back if needed.   Layla Lah MD, FACP 03/30/2024, 11:47 AM  Contact #  [phone]

## 2024-03-30 NOTE — Evaluation (Signed)
 Physical Therapy Evaluation Patient Details Name: Elizabeth [name] MRN: [id number] DOB: Sep 02, 1940 Today's Date: 03/30/2024  History of Present Illness  Elizabeth [name] is an 83 year old female who presented with shortness of breath, fatigue from skilled nursing facility; admitted 03/27/24 with acute on chronic respiratory failure with hypercarpnia. She was recently hospitalized in October for hypercarbia because she refused to wear her BiPAP.  She was discharged on auto BiPAP and oxygen .  She was found to be lethargic than usual and speaking less. Chest x-ray showed bilateral pulm edema with bilateral pleural effusions left greater than right. PMH: chronic hypoxic respiratory failure on nightly BiPAP but noncompliant, type 2 diabetes, obesity, OHS, CHF, asthma, heart failure with preserved ejection fraction  Clinical Impression  Pt admitted with above diagnosis. PTA, pt with multiple reports, stating remains in bed but also states staff assists her to w/c via lift or stand pivot, no family present to verify. On eval, pt limited by L calf pain, able to engage in LE AROM with cues, AROM WFL, reports L calf pain with movement and to palpation, described as sore and declines mobilizing to bedside- notified RN. Pt needing max A to scoot up in bed with bedpad and bed assist, LE assisting and no UE assistance to scoot up in bed. Therapist repositioned to comfort and assist pt with water cup as pt declines to reach for cup and grab. Patient will benefit from continued inpatient follow up therapy, <3 hours/day vs return to LTC pending progress and participate with acute PT. Pt currently with functional limitations due to the deficits listed below (see PT Problem List). Pt will benefit from acute skilled PT to increase their independence and safety with mobility to allow discharge.           If plan is discharge home, recommend the following: Two people to help with walking and/or transfers;A lot of help with  bathing/dressing/bathroom;Assistance with cooking/housework;Assist for transportation   Can travel by private vehicle   No    Equipment Recommendations None recommended by PT  Recommendations for Other Services       Functional Status Assessment Patient has had a recent decline in their functional status and demonstrates the ability to make significant improvements in function in a reasonable and predictable amount of time.     Precautions / Restrictions Precautions Precautions: Fall Restrictions Weight Bearing Restrictions Per Provider Order: No      Mobility  Bed Mobility Overal bed mobility: Needs Assistance             General bed mobility comments: max A to scoot up in bed wtih bedpad, pt using BLE to push into bed,  no BUE assistance to scoot up; pt declines transterring to EOB due to pain in L calf - notified RN    Transfers                        Ambulation/Gait                  Stairs            Wheelchair Mobility     Tilt Bed    Modified Rankin (Stroke Patients Only)       Balance  Pertinent Vitals/Pain Pain Assessment Pain Assessment: Faces Faces Pain Scale: Hurts little more Pain Location: L calf Pain Descriptors / Indicators: Sore Pain Intervention(s): Limited activity within patient's tolerance, Monitored during session (notified RN)    Home Living Family/patient expects to be discharged to:: Skilled nursing facility                   Additional Comments: Per chart review LTC resident from [facility], pt with conflicing stories reporting facilites assist her wtih transfers to w/c then later reporting remains in bed - no family present to verify    Prior Function Prior Level of Function : Needs assist;Patient poor historian/Family not available             Mobility Comments: per pt, using RW to pivot to w/c or using lift or remains in  bed ADLs Comments: per pt, staff assists with self care     Extremity/Trunk Assessment   Upper Extremity Assessment Upper Extremity Assessment: Defer to OT evaluation    Lower Extremity Assessment Lower Extremity Assessment: RLE deficits/detail;LLE deficits/detail RLE Deficits / Details: AROM WFL throughout, knee flexion ~80 deg in supine, full knee extension in supine, active quad set noted, hip abd/add ~5 inches RLE Sensation: decreased light touch (pt reports numbness in bil feet) LLE Deficits / Details: AROM WFL throughout, knee flexion ~80 deg in supine, full knee extension in supine, active quad set noted, hip abd/add ~5 inches LLE Sensation: decreased light touch (pt reports numbness in bil feet)       Communication   Communication Communication: No apparent difficulties    Cognition Arousal: Alert Behavior During Therapy: Flat affect   PT - Cognitive impairments: No family/caregiver present to determine baseline                       PT - Cognition Comments: pt states name and DOB appropriatley, reports current date 03/23/24, unable to recall name of nursing facility residing in, aware of being in hospital Following commands: Intact       Cueing Cueing Techniques: Verbal cues, Tactile cues     General Comments      Exercises     Assessment/Plan    PT Assessment Patient needs continued PT services  PT Problem List Decreased strength;Decreased activity tolerance;Decreased balance;Decreased mobility;Impaired sensation;Decreased cognition;Pain       PT Treatment Interventions DME instruction;Gait training;Functional mobility training;Therapeutic activities;Therapeutic exercise;Balance training;Patient/family education;Wheelchair mobility training    PT Goals (Current goals can be found in the Care Plan section)  Acute Rehab PT Goals Patient Stated Goal: none stated PT Goal Formulation: With patient Time For Goal Achievement: 04/13/24 Potential to  Achieve Goals: Fair    Frequency Min 1X/week     Co-evaluation               AM-PAC PT 6 Clicks Mobility  Outcome Measure Help needed turning from your back to your side while in a flat bed without using bedrails?: Total Help needed moving from lying on your back to sitting on the side of a flat bed without using bedrails?: Total Help needed moving to and from a bed to a chair (including a wheelchair)?: Total Help needed standing up from a chair using your arms (e.g., wheelchair or bedside chair)?: Total Help needed to walk in hospital room?: Total Help needed climbing 3-5 steps with a railing? : Total 6 Click Score: 6    End of Session Equipment Utilized During Treatment: Oxygen  Activity Tolerance:  Patient tolerated treatment well;Patient limited by pain Patient left: in bed;with call bell/phone within reach;with bed alarm set Nurse Communication: Mobility status;Other (comment);Need for lift equipment (L calf pain) PT Visit Diagnosis: Other abnormalities of gait and mobility (R26.89);Muscle weakness (generalized) (M62.81)    Time: 8540-8477 PT Time Calculation (min) (ACUTE ONLY): 23 min   Charges:   PT Evaluation $PT Eval Moderate Complexity: 1 Mod   PT General Charges $$ ACUTE PT VISIT: 1 Visit         Tori Tishara Pizano PT, DPT 03/30/24, 3:40 PM

## 2024-03-31 DIAGNOSIS — J9622 Acute and chronic respiratory failure with hypercapnia: Secondary | ICD-10-CM | POA: Diagnosis not present

## 2024-03-31 LAB — BASIC METABOLIC PANEL WITH GFR
Anion gap: 3 — ABNORMAL LOW (ref 5–15)
BUN: 21 mg/dL (ref 8–23)
CO2: 45 mmol/L — ABNORMAL HIGH (ref 22–32)
Calcium: 9 mg/dL (ref 8.9–10.3)
Chloride: 89 mmol/L — ABNORMAL LOW (ref 98–111)
Creatinine, Ser: 0.65 mg/dL (ref 0.44–1.00)
GFR, Estimated: >60 mL/min (ref >60)
Glucose, Bld: 82 mg/dL (ref 70–99)
Potassium: 3.8 mmol/L (ref 3.5–5.1)
Sodium: 138 mmol/L (ref 135–145)

## 2024-03-31 LAB — CBC
HCT: 28.7 % — ABNORMAL LOW (ref 36.0–46.0)
Hemoglobin: 8.5 g/dL — ABNORMAL LOW (ref 12.0–15.0)
MCH: 29.3 pg (ref 26.0–34.0)
MCHC: 29.6 g/dL — ABNORMAL LOW (ref 30.0–36.0)
MCV: 99 fL (ref 80.0–100.0)
Platelets: 169 K/uL (ref 150–400)
RBC: 2.9 MIL/uL — ABNORMAL LOW (ref 3.87–5.11)
RDW: 17.4 % — ABNORMAL HIGH (ref 11.5–15.5)
WBC: 3.8 K/uL — ABNORMAL LOW (ref 4.0–10.5)
nRBC: 0 % (ref 0.0–0.2)

## 2024-03-31 LAB — GLUCOSE, CAPILLARY
Glucose-Capillary: 81 mg/dL (ref 70–99)
Glucose-Capillary: 82 mg/dL (ref 70–99)
Glucose-Capillary: 83 mg/dL (ref 70–99)
Glucose-Capillary: 97 mg/dL (ref 70–99)

## 2024-03-31 MED ORDER — FERROUS SULFATE 325 (65 FE) MG PO TABS: 325.0000 mg | ORAL_TABLET | Freq: Every day | ORAL | Status: AC

## 2024-03-31 MED ORDER — POLYETHYLENE GLYCOL 3350 17 G PO PACK: 17.0000 g | PACK | Freq: Every day | ORAL | Status: AC

## 2024-03-31 MED ORDER — APIXABAN 5 MG PO TABS
5.0000 mg | ORAL_TABLET | Freq: Two times a day (BID) | ORAL | Status: DC
  Administered 2024-03-31: 5 mg via ORAL
  Filled 2024-03-31: qty 1

## 2024-03-31 MED ORDER — POLYETHYLENE GLYCOL 3350 17 G PO PACK
17.0000 g | PACK | Freq: Every day | ORAL | Status: DC
  Administered 2024-03-31: 17 g via ORAL
  Filled 2024-03-31: qty 1

## 2024-03-31 MED ORDER — SENNA 8.6 MG PO TABS: 17.2000 mg | ORAL_TABLET | Freq: Two times a day (BID) | ORAL | Status: DC

## 2024-03-31 MED ORDER — SENNA 8.6 MG PO TABS: 17.2000 mg | ORAL_TABLET | Freq: Two times a day (BID) | ORAL | Status: AC

## 2024-03-31 MED ORDER — PANTOPRAZOLE SODIUM 40 MG PO TBEC: 40.0000 mg | DELAYED_RELEASE_TABLET | Freq: Every day | ORAL | Status: AC

## 2024-03-31 MED ORDER — FUROSEMIDE 40 MG PO TABS
40.0000 mg | ORAL_TABLET | Freq: Every day | ORAL | Status: DC
  Administered 2024-03-31: 40 mg via ORAL
  Filled 2024-03-31: qty 1

## 2024-03-31 MED ORDER — FOLIC ACID 1 MG PO TABS: 2.0000 mg | ORAL_TABLET | Freq: Every day | ORAL | Status: AC

## 2024-03-31 MED ORDER — FUROSEMIDE 40 MG PO TABS: 40.0000 mg | ORAL_TABLET | Freq: Every day | ORAL | Status: AC

## 2024-03-31 NOTE — Plan of Care (Signed)
   Problem: Education: Goal: Knowledge of General Education information will improve Description: Including pain rating scale, medication(s)/side effects and non-pharmacologic comfort measures Outcome: Progressing   Problem: Health Behavior/Discharge Planning: Goal: Ability to manage health-related needs will improve Outcome: Progressing   Problem: Clinical Measurements: Goal: Will remain free from infection Outcome: Progressing

## 2024-03-31 NOTE — Discharge Summary (Signed)
 Physician Discharge Summary  Elizabeth [name] FMW:[id number] DOB: [birth year] DOA: 03/27/2024  PCP: Albina GORMAN Dine, MD  Admit date: 03/27/2024 Discharge date: 03/31/2024  Admitted From: Home Disposition:  Home  Discharge Condition:Stable CODE STATUS:FULL, DNR, Comfort Care Diet recommendation: Heart Healthy / Carb Modified / Regular / Dysphagia   Brief/Interim Summary: Patient is 83 year old female with history of chronic hypoxic respiratory failure on nightly BiPAP but noncompliant, type 2 diabetes, obesity, OHS, CHF, asthma who presented with shortness of breath, fatigue from skilled nursing facility. She was recently hospitalized in October for hypercarbia because she refused to wear her BiPAP. She was discharged on auto BiPAP and oxygen . She was found to be lethargic than usual and speaking less. She was not using her BiPAP. On presentation, venous blood gas showed pH of 7.16, pCO2 of 114. Put on BiPAP. Lab work showed elevated BNP of 2862, hemoglobin 7.7. CT head did not show any acute intracranial abnormalities. Chest x-ray showed bilateral pulm edema with bilateral pleural effusions left greater than right. Started on IV Lasix . Significant improvement in the respiratory status now.  Medically stable for discharge back to nursing facility today.  Following problems were addressed during the hospitalization:  Acute on chronic hypoxic/hypercarbic respiratory failure: Secondary to OHS/OSA.  Uses BiPAP at night.  Uses 2 L of oxygen  at baseline.  Recently admitted for the same.  Noncompliant with BiPAP.  pCO2 was elevated on presentation.  Put on BiPAP here.  Uses supplemental oxygen  at baseline. On albuterol , cetirizine , Flonase , Advair at home. ABG done on 12/7 showed improvement in the pCO2, normal pH.  Will continue BiPAP at night only.  Currently back on 2 L of oxygen . She needs  to continue BiPAP at night at SNF   Chronic diastolic CHF: Elevated proBNP. Had significant bilateral lower  extremity edema which looks improved with Lasix .  Continue Lasix  40 mg daily   Macrocytic anemia/GI bleed/positive FOBT: Baseline hemoglobin around 7-8.  No report of hematochezia or melena.  Recent vitamin B12 was more than normal but folic acid  was low at 4.8.  Continue folic acid  supplementation.  She  was given a unit of PRBC.  Positive check FOBT.  Iron panel about a month ago showed low iron, but repeat iron level shows optimal iron.  Continue oral iron supplementation,folate.GI saw her here, no plan for EGD or colonoscopy.  Resumed Eliquis .  Continue Protonix  40 mg daily.  Hypertension: On amlodipine , hydralazine , losartan .  Continue the same.   Paroxysmal A-fib: Currently she is on normal sinus rhythm.  On Eliquis  for anticoagulation   Hyperlipidemia: On statin   Mood disorder: On donepezil , sertraline    Morbid obesity: BMI of 45.3   Goals of care: Elderly patient with multiple comorbidities, frequent readmissions.  Noncompliant with BiPAP.Patient was enrolled with outpatient hospice.  She is nonambulatory at baseline.  Daughter wants to cancel her enrollment with hospice now.  She want to be intubated if needed but no chest compressions.Most form updated   Discharge Diagnoses:  Principal Problem:   Acute on chronic respiratory failure with hypercapnia (HCC) Active Problems:   Diabetes mellitus without complication (HCC)   OSA (obstructive sleep apnea)   Morbid obesity (HCC)   Obesity hypoventilation syndrome (HCC)    Discharge Instructions  Discharge Instructions     Diet - low sodium heart healthy   Complete by: As directed    Discharge instructions   Complete by: As directed    1)Please take your medications as instructed 2)Do a CBC, BMP  tests in a week 2)Continue using bipap at night   Increase activity slowly   Complete by: As directed    No wound care   Complete by: As directed       Allergies as of 03/31/2024   No Known Allergies      Medication List      TAKE these medications    acetaminophen  500 MG tablet Commonly known as: TYLENOL  Take 500 mg by mouth 3 (three) times daily.   albuterol  108 (90 Base) MCG/ACT inhaler Commonly known as: VENTOLIN  HFA Inhale 2 puffs into the lungs every 6 (six) hours as needed for shortness of breath.   albuterol  (2.5 MG/3ML) 0.083% nebulizer solution Commonly known as: PROVENTIL  Take 2.5 mg by nebulization every 6 (six) hours.   amLODipine  10 MG tablet Commonly known as: NORVASC  Take 1 tablet (10 mg total) by mouth daily.   apixaban  5 MG Tabs tablet Commonly known as: ELIQUIS  Take 1 tablet (5 mg total) by mouth 2 (two) times daily.   bisacodyl  5 MG EC tablet Commonly known as: DULCOLAX Take 2 tablets (10 mg total) by mouth daily as needed for moderate constipation or severe constipation.   CENTRUM SILVER PO Take 1 tablet by mouth daily.   Cetirizine  HCl 10 MG Caps Take 10 mg by mouth daily.   cyanocobalamin  1000 MCG tablet Commonly known as: VITAMIN B12 Take 1,000 mcg by mouth daily.   diclofenac  Sodium 1 % Gel Commonly known as: VOLTAREN  Apply 4 g topically 4 (four) times daily. Apply to bilateral knees   donepezil  5 MG tablet Commonly known as: ARICEPT  Take 1 tablet (5 mg total) by mouth daily. What changed: when to take this   ferrous sulfate  325 (65 FE) MG tablet Take 1 tablet (325 mg total) by mouth daily with breakfast. Start taking on: April 01, 2024   fluticasone  50 MCG/ACT nasal spray Commonly known as: FLONASE  PLACE 1 SPRAY INTO BOTH NOSTRILS 2 (TWO) TIMES DAILY   fluticasone -salmeterol 250-50 MCG/ACT Aepb Commonly known as: ADVAIR Inhale 1 puff into the lungs in the morning and at bedtime.   folic acid  1 MG tablet Commonly known as: FOLVITE  Take 2 tablets (2 mg total) by mouth daily. Start taking on: April 01, 2024   furosemide  40 MG tablet Commonly known as: Lasix  Take 1 tablet (40 mg total) by mouth daily. Start taking on: April 01, 2024 What changed:  when to take this reasons to take this additional instructions   hydrALAZINE  50 MG tablet Commonly known as: APRESOLINE  Take 50 mg by mouth every 6 (six) hours.   hydrOXYzine 25 MG tablet Commonly known as: ATARAX Take 25 mg by mouth daily.   losartan  50 MG tablet Commonly known as: COZAAR  Take 1 tablet (50 mg total) by mouth daily.   melatonin 3 MG Tabs tablet Take 6 mg by mouth at bedtime.   oxybutynin  10 MG 24 hr tablet Commonly known as: DITROPAN -XL Take 10 mg by mouth daily.   OXYGEN  Inhale 2-3 L/min into the lungs See admin instructions. When using CPAP and night, bleeding O2 3L. Oxygen  via nasal cannula at 2 L/min as needed for shortness of breath or sats < 90%.   pantoprazole  40 MG tablet Commonly known as: PROTONIX  Take 1 tablet (40 mg total) by mouth daily.   polyethylene glycol 17 g packet Commonly known as: MIRALAX  / GLYCOLAX  Take 17 g by mouth daily. What changed:  when to take this reasons to take this   potassium  chloride 10 MEQ tablet Commonly known as: KLOR-CON  Take 10 mEq by mouth daily.   predniSONE  5 MG tablet Commonly known as: DELTASONE  Take by mouth.   rosuvastatin  5 MG tablet Commonly known as: CRESTOR  Take 5 mg by mouth at bedtime.   senna 8.6 MG Tabs tablet Commonly known as: SENOKOT Take 2 tablets (17.2 mg total) by mouth 2 (two) times daily. What changed: when to take this   sertraline  25 MG tablet Commonly known as: ZOLOFT  Take 12.5 mg by mouth daily.   sodium phosphate  Enem Place 1 enema rectally once as needed (constipation).   Systane Complete 0.6 % Soln Generic drug: Propylene Glycol Place 1 drop into both eyes every 6 (six) hours as needed (dry eyes).        Contact information for follow-up providers     Albina GORMAN Dine, MD. Schedule an appointment as soon as possible for a visit in 1 week(s).   Specialty: Internal Medicine Contact information: [address] [place]  [address] [phone]              Contact information for after-discharge care     Destination     [facility] .   Service: Skilled Nursing Contact information: [address] [place]  [address] [phone]                    No Known Allergies  Consultations: GI   Procedures/Studies: DG Shoulder Right Result Date: 03/29/2024 CLINICAL DATA:  712263 Shoulder pain, right [id number] EXAM: RIGHT SHOULDER - 2+ VIEW COMPARISON:  Right shoulder radiographs 07/25/2016. FINDINGS: The mineralization and alignment are normal. There is no evidence of acute fracture or dislocation. 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 Similar mild-to-moderate acromioclavicular degenerative changes. 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 No acute osseous findings. Electronically Signed   By: Elsie Perone M.D.   On: 03/29/2024 17:59   DG Chest Port 1 View Result Date: 03/27/2024 EXAM: 1 VIEW(S) XRAY OF THE CHEST 03/27/2024 04:03:00 PM COMPARISON: 02/01/2024 CLINICAL HISTORY: dyspnea FINDINGS: LUNGS AND PLEURA: Probable bilateral pulmonary edema is noted with associated pleural effusions left greater than right. No pneumothorax. HEART AND MEDIASTINUM: Stable cardiomegaly. BONES AND SOFT TISSUES: No acute osseous abnormality. IMPRESSION: 1. Stable cardiomegaly. 2. Probable bilateral pulmonary edema with associated pleural effusions, left greater than right. Electronically signed by: Lynwood Seip MD 03/27/2024 04:18 PM EST RP Workstation: [id number]   CT HEAD WO CONTRAST Result Date: 03/27/2024 EXAM: CT HEAD WITHOUT CONTRAST 03/27/2024 04:08:30 PM TECHNIQUE: CT of the head was performed without the administration of intravenous contrast. Automated exposure control, iterative reconstruction, and/or weight based adjustment of the mA/kV was utilized to reduce the radiation dose to as low as  reasonably achievable. COMPARISON: None available. CLINICAL HISTORY: Mental status change, unknown cause. FINDINGS: BRAIN AND VENTRICLES: No acute hemorrhage. No evidence of acute infarct. No hydrocephalus. No extra-axial collection. No mass effect or midline shift. Mild atrophy and white matter changes are stable. ORBITS: No acute abnormality. SINUSES: Chronic right maxillary sinus opacification is again noted. Heterogeneous soft tissue extends into the right nasal cavity. SOFT TISSUES AND SKULL: No acute soft tissue abnormality. No skull fracture. VASCULATURE: Atherosclerotic calcifications are present in the cavernous carotid arteries bilaterally and at the dural margin of both vertebral arteries. No hyperdense vessel is present. IMPRESSION: 1. No acute intracranial abnormality. 2. Mild atrophy and white matter changes are stable. Electronically signed by: Lonni Necessary MD 03/27/2024  04:16 PM EST RP Workstation: [id number]      Subjective: Patient seen and examined at bedside today.  Hemodynamically stable.  Comfortable.  No new complaints today.  On 2 L of oxygen  per minute.  Not in any respiratory  distress.  Lower extremity edema improved.  Medically stable for discharge.  I called her daughter and discussed about discharge planning.  Discharge Exam: Vitals:   03/31/24 0800 03/31/24 0929  BP: (!) 154/43 (!) 154/65  Pulse: 61   Resp: (!) 24   Temp:    SpO2: 98%    Vitals:   03/31/24 0700 03/31/24 0758 03/31/24 0800 03/31/24 0929  BP: (!) 120/33  (!) 154/43 (!) 154/65  Pulse: (!) 57  61   Resp: 16  (!) 24   Temp:  (!) 96 F (35.6 C)    TempSrc:  Axillary    SpO2: 99%  98%   Weight:      Height:        General: Pt is alert, awake, not in acute distress, obese, deconditioned Cardiovascular: RRR, S1/S2 +, no rubs, no gallops Respiratory: Diminished sounds on bases Abdominal: Soft, NT, ND, bowel sounds + Extremities: no edema, no cyanosis    The results of significant  diagnostics from this hospitalization (including imaging, microbiology, ancillary and laboratory) are listed below for reference.     Microbiology: Recent Results (from the past 240 hours)  MRSA Next Gen by PCR, Nasal     Status: None   Collection Time: 03/27/24  8:32 PM   Specimen: Nasal Mucosa; Nasal Swab  Result Value Ref Range Status   MRSA by PCR Next Gen NOT DETECTED NOT DETECTED Final    Comment: (NOTE) The GeneXpert MRSA Assay (FDA approved for NASAL specimens only), is one component of a comprehensive MRSA colonization surveillance program. It is not intended to diagnose MRSA infection nor to guide or monitor treatment for MRSA infections. Test performance is not FDA approved in patients less than 66 years old. Performed at [facility], [address], [place], [place] [address]      Labs: BNP (last 3 results) Recent Labs    09/15/23 0331 09/18/23 1155 12/05/23 0925  BNP 313.2* 221.2* 391.2*   Basic Metabolic Panel: Recent Labs  Lab 03/27/24 1526 03/28/24 0309 03/29/24 0251 03/30/24 0316 03/31/24 0322  NA 138 140 140 138 138  K 5.1 4.7 4.3 3.9 3.8  CL 96* 97* 94* 91* 89*  CO2 38* 36* 42* 44* 45*  GLUCOSE 90 66* 74 76 82  BUN 31* 30* 23 22 21   CREATININE 1.00 0.82 0.69 0.68 0.65  CALCIUM  9.6 9.5 9.4 9.2 9.0  MG  --  1.7  --   --   --    Liver Function Tests: Recent Labs  Lab 03/27/24 1526  AST <10*  ALT 8  ALKPHOS 48  BILITOT 0.2  PROT 6.6  ALBUMIN 3.7   No results for input(s): LIPASE, AMYLASE in the last 168 hours. Recent Labs  Lab 03/27/24 1525  AMMONIA 38*   CBC: Recent Labs  Lab 03/27/24 1526 03/28/24 0309 03/28/24 1501 03/29/24 0251 03/30/24 0316 03/31/24 0322  WBC 5.6 4.5  --  4.7 4.9 3.8*  NEUTROABS 2.4  --   --   --   --   --   HGB 7.7* 6.7* 8.1* 8.0* 8.0* 8.5*  HCT 28.2* 24.0* 26.9* 27.2* 26.8* 28.7*  MCV 107.6* 103.9*  --  95.8 96.1 99.0  PLT 195 170  --  182 173 169   Cardiac Enzymes: No  results for input(s): CKTOTAL, CKMB, CKMBINDEX, TROPONINI in the last 168 hours. BNP: Invalid input(s): POCBNP CBG: Recent Labs  Lab 03/30/24 1149 03/30/24 1511 03/31/24 0003 03/31/24 0402 03/31/24 0741  GLUCAP 109* 93 83 81 82   D-Dimer No results for input(s): DDIMER in the last 72 hours. Hgb A1c No results for input(s): HGBA1C in the last 72 hours. Lipid Profile No results for input(s): CHOL, HDL, LDLCALC, TRIG, CHOLHDL, LDLDIRECT in the last 72 hours. Thyroid  function studies No results for input(s): TSH, T4TOTAL, T3FREE, THYROIDAB in the last 72 hours.  Invalid input(s): FREET3 Anemia work up Recent Labs    03/28/24 1501  TIBC 273  IRON 149   Urinalysis    Component Value Date/Time   COLORURINE STRAW (A) 03/27/2024 2032   APPEARANCEUR CLEAR 03/27/2024 2032   LABSPEC 1.008 03/27/2024 2032   PHURINE 5.0 03/27/2024 2032   GLUCOSEU NEGATIVE 03/27/2024 2032   HGBUR NEGATIVE 03/27/2024 2032   BILIRUBINUR NEGATIVE 03/27/2024 2032   KETONESUR NEGATIVE 03/27/2024 2032   PROTEINUR NEGATIVE 03/27/2024 2032   UROBILINOGEN 1.0 10/05/2009 1218   NITRITE NEGATIVE 03/27/2024 2032   LEUKOCYTESUR MODERATE (A) 03/27/2024 2032   Sepsis Labs Recent Labs  Lab 03/28/24 0309 03/29/24 0251 03/30/24 0316 03/31/24 0322  WBC 4.5 4.7 4.9 3.8*   Microbiology Recent Results (from the past 240 hours)  MRSA Next Gen by PCR, Nasal     Status: None   Collection Time: 03/27/24  8:32 PM   Specimen: Nasal Mucosa; Nasal Swab  Result Value Ref Range Status   MRSA by PCR Next Gen NOT DETECTED NOT DETECTED Final    Comment: (NOTE) The GeneXpert MRSA Assay (FDA approved for NASAL specimens only), is one component of a comprehensive MRSA colonization surveillance program. It is not intended to diagnose MRSA infection nor to guide or monitor treatment for MRSA infections. Test performance is not FDA approved in patients less than 58 years old. Performed  at [facility], [address] [place], [place] [address]     Please note: You were cared for by a hospitalist during your hospital stay. Once you are discharged, your primary care physician will handle any further medical issues. Please note that NO REFILLS for any discharge medications will be authorized once you are discharged, as it is imperative that you return to your primary care physician (or establish a relationship with a primary care physician if you do not have one) for your post hospital discharge needs so that they can reassess your need for medications and monitor your lab values.    Time coordinating discharge: 40 minutes  SIGNED:   Ivonne Mustache, MD  [facility] 03/31/2024, 10:22 AM Pager [phone]  If 7PM-7AM, please contact night-coverage [email or web address] Password [email or web address]

## 2024-03-31 NOTE — TOC Transition Note (Signed)
 Transition of Care [facility]) - Discharge Note   Patient Details  Name: Elizabeth [name] MRN: [id number] Date of Birth: 08/25/1940  Transition of [facility]) CM/SW Contact:  Jon ONEIDA Anon, RN Phone Number: 03/31/2024, 9:59 AM   Clinical Narrative:    Pt is to discharge back to [facility] for LTC. Pt and pt daughter agreeable with DC plan. Pt will transport via [facility]. [facility] called at 1040. DC packet placed at RN station with signed DNR/MOST form inside. RN given number to call report to [phone] for room 114A. No further ICM needs at this time. Will sign off.      Final next level of care: Long Term Nursing Home Barriers to Discharge: Barriers Resolved   Patient Goals and CMS Choice Patient states their goals for this hospitalization and ongoing recovery are:: Return to [facility].gov Compare Post Acute Care list provided to:: Patient Represenative (must comment) Choice offered to / list presented to : [facility] POA / Guardian [place] ownership interest in [facility].provided to:: [facility] POA / Guardian    Discharge Placement              Patient chooses bed at: Other - please specify in the comment section below: [facility]) Patient to be transferred to facility by: [facility] Name of family member notified: Shrake(POA), Duayne (Daughter)  [phone] Patient and family notified of of transfer: 03/31/24  Discharge Plan and Services Additional resources added to the After Visit Summary for   In-house Referral: Hospice / Palliative Care Discharge Planning Services: CM Consult Post Acute Care Choice: Nursing Home          DME Arranged: N/A DME Agency: NA       HH Arranged: NA HH Agency: NA        Social Drivers of Health (SDOH) Interventions SDOH Screenings   Food Insecurity: No Food Insecurity (03/28/2024)  Housing: Low Risk  (03/28/2024)  Transportation Needs: No Transportation Needs (03/28/2024)  Utilities: Not At Risk (03/28/2024)  Social  Connections: Patient Declined (03/28/2024)  Tobacco Use: Medium Risk (03/27/2024)     Readmission Risk Interventions    03/29/2024   11:20 AM 01/25/2024    3:44 PM 04/28/2023   11:56 AM  Readmission Risk Prevention Plan  Transportation Screening Complete Complete Complete  PCP or Specialist Appt within 5-7 Days   Complete  PCP or Specialist Appt within 3-5 Days  Complete   Home Care Screening   Complete  Medication Review (RN CM)   Complete  HRI or Home Care Consult  Complete   Social Work Consult for Recovery Care Planning/Counseling  Complete   Palliative Care Screening  Not Applicable   Medication Review [employer]) Complete Complete   PCP or Specialist appointment within 3-5 days of discharge Complete    HRI or Home Care Consult Complete    SW Recovery Care/Counseling Consult Complete    Palliative Care Screening Complete    Skilled Nursing Facility Complete

## 2024-03-31 NOTE — Progress Notes (Signed)
 Chaplains received a consult to assist with MOST form.  I spoke with Sharyl's nurse to explain that this is a physician led document that we are unable to help with.

## 2024-04-01 ENCOUNTER — Ambulatory Visit: Admitting: Pulmonary Disease

## 2024-04-06 ENCOUNTER — Telehealth: Payer: Self-pay | Admitting: Cardiology

## 2024-04-06 NOTE — Telephone Encounter (Signed)
 Called pt to schedule recall. Pts daughter Elizabeth [name] stated her mother has been sick lately with multiple ED visits and has a hard time leaving the house. Pts daughter requested recall be deleted.

## 2024-04-23 ENCOUNTER — Ambulatory Visit: Admitting: Physician Assistant
